# Patient Record
Sex: Male | Born: 1960 | Race: Black or African American | Hispanic: No | Marital: Married | Smoking: Never smoker
Health system: Southern US, Community
[De-identification: ages and names within clinical notes are randomized; demographics above are authoritative.]

## PROBLEM LIST (undated history)

## (undated) ENCOUNTER — Ambulatory Visit (HOSPITAL_COMMUNITY): Admission: EM | Source: Home / Self Care

## (undated) DIAGNOSIS — E785 Hyperlipidemia, unspecified: Secondary | ICD-10-CM

## (undated) DIAGNOSIS — J189 Pneumonia, unspecified organism: Secondary | ICD-10-CM

## (undated) DIAGNOSIS — E349 Endocrine disorder, unspecified: Secondary | ICD-10-CM

## (undated) DIAGNOSIS — R972 Elevated prostate specific antigen [PSA]: Principal | ICD-10-CM

## (undated) DIAGNOSIS — C9000 Multiple myeloma not having achieved remission: Secondary | ICD-10-CM

## (undated) DIAGNOSIS — R7301 Impaired fasting glucose: Secondary | ICD-10-CM

## (undated) DIAGNOSIS — I10 Essential (primary) hypertension: Secondary | ICD-10-CM

## (undated) DIAGNOSIS — M1990 Unspecified osteoarthritis, unspecified site: Secondary | ICD-10-CM

## (undated) HISTORY — DX: Impaired fasting glucose: R73.01

## (undated) HISTORY — DX: Hyperlipidemia, unspecified: E78.5

## (undated) HISTORY — DX: Multiple myeloma not having achieved remission: C90.00

## (undated) HISTORY — DX: Elevated prostate specific antigen (PSA): R97.20

## (undated) HISTORY — DX: Endocrine disorder, unspecified: E34.9

## (undated) HISTORY — PX: OTHER SURGICAL HISTORY: SHX169

---

## 2001-01-26 ENCOUNTER — Encounter (HOSPITAL_BASED_OUTPATIENT_CLINIC_OR_DEPARTMENT_OTHER): Payer: Self-pay | Admitting: General Surgery

## 2001-01-28 ENCOUNTER — Ambulatory Visit (HOSPITAL_COMMUNITY): Admission: RE | Admit: 2001-01-28 | Discharge: 2001-01-28 | Payer: Self-pay | Admitting: General Surgery

## 2001-06-09 ENCOUNTER — Emergency Department (HOSPITAL_COMMUNITY): Admission: EM | Admit: 2001-06-09 | Discharge: 2001-06-09 | Payer: Self-pay | Admitting: Emergency Medicine

## 2001-06-12 ENCOUNTER — Ambulatory Visit (HOSPITAL_COMMUNITY): Admission: RE | Admit: 2001-06-12 | Discharge: 2001-06-12 | Payer: Self-pay | Admitting: Orthopedic Surgery

## 2001-06-12 ENCOUNTER — Encounter: Payer: Self-pay | Admitting: Orthopedic Surgery

## 2001-06-16 ENCOUNTER — Encounter: Payer: Self-pay | Admitting: Neurosurgery

## 2001-06-16 ENCOUNTER — Ambulatory Visit (HOSPITAL_COMMUNITY): Admission: RE | Admit: 2001-06-16 | Discharge: 2001-06-17 | Payer: Self-pay | Admitting: Neurosurgery

## 2001-08-01 ENCOUNTER — Encounter: Payer: Self-pay | Admitting: Neurosurgery

## 2001-08-01 ENCOUNTER — Encounter: Admission: RE | Admit: 2001-08-01 | Discharge: 2001-08-01 | Payer: Self-pay | Admitting: Neurosurgery

## 2001-09-28 ENCOUNTER — Encounter: Payer: Self-pay | Admitting: Neurosurgery

## 2001-09-28 ENCOUNTER — Encounter: Admission: RE | Admit: 2001-09-28 | Discharge: 2001-09-28 | Payer: Self-pay | Admitting: Neurosurgery

## 2001-12-28 ENCOUNTER — Encounter: Payer: Self-pay | Admitting: Neurosurgery

## 2001-12-28 ENCOUNTER — Encounter: Admission: RE | Admit: 2001-12-28 | Discharge: 2001-12-28 | Payer: Self-pay | Admitting: Neurosurgery

## 2003-09-24 ENCOUNTER — Encounter: Payer: Self-pay | Admitting: Internal Medicine

## 2003-09-26 ENCOUNTER — Encounter: Payer: Self-pay | Admitting: Internal Medicine

## 2005-01-05 ENCOUNTER — Ambulatory Visit: Payer: Self-pay | Admitting: Internal Medicine

## 2005-03-30 ENCOUNTER — Ambulatory Visit: Payer: Self-pay | Admitting: Internal Medicine

## 2005-06-03 ENCOUNTER — Ambulatory Visit: Payer: Self-pay | Admitting: Internal Medicine

## 2005-08-12 ENCOUNTER — Ambulatory Visit: Payer: Self-pay | Admitting: Internal Medicine

## 2005-12-31 ENCOUNTER — Emergency Department (HOSPITAL_COMMUNITY): Admission: EM | Admit: 2005-12-31 | Discharge: 2005-12-31 | Payer: Self-pay | Admitting: Emergency Medicine

## 2009-01-14 ENCOUNTER — Emergency Department (HOSPITAL_BASED_OUTPATIENT_CLINIC_OR_DEPARTMENT_OTHER): Admission: EM | Admit: 2009-01-14 | Discharge: 2009-01-14 | Payer: Self-pay | Admitting: Emergency Medicine

## 2009-04-08 ENCOUNTER — Ambulatory Visit: Payer: Self-pay | Admitting: Internal Medicine

## 2009-04-08 DIAGNOSIS — I10 Essential (primary) hypertension: Secondary | ICD-10-CM | POA: Insufficient documentation

## 2009-04-08 DIAGNOSIS — R9431 Abnormal electrocardiogram [ECG] [EKG]: Secondary | ICD-10-CM

## 2009-04-08 DIAGNOSIS — R7301 Impaired fasting glucose: Secondary | ICD-10-CM | POA: Insufficient documentation

## 2009-04-08 DIAGNOSIS — E785 Hyperlipidemia, unspecified: Secondary | ICD-10-CM

## 2009-04-08 DIAGNOSIS — E291 Testicular hypofunction: Secondary | ICD-10-CM | POA: Insufficient documentation

## 2009-04-09 ENCOUNTER — Encounter: Payer: Self-pay | Admitting: Internal Medicine

## 2009-04-10 ENCOUNTER — Encounter (INDEPENDENT_AMBULATORY_CARE_PROVIDER_SITE_OTHER): Payer: Self-pay | Admitting: *Deleted

## 2009-08-16 ENCOUNTER — Ambulatory Visit: Payer: Self-pay | Admitting: Internal Medicine

## 2009-08-19 ENCOUNTER — Encounter (INDEPENDENT_AMBULATORY_CARE_PROVIDER_SITE_OTHER): Payer: Self-pay | Admitting: *Deleted

## 2009-08-19 LAB — CONVERTED CEMR LAB
Bilirubin, Direct: 0.1 mg/dL (ref 0.0–0.3)
HDL: 30.5 mg/dL — ABNORMAL LOW (ref >39.00)
LDL Cholesterol: 71 mg/dL (ref 0–99)
Total Bilirubin: 1.3 mg/dL — ABNORMAL HIGH (ref 0.3–1.2)
Total CHOL/HDL Ratio: 4
VLDL: 21.8 mg/dL (ref 0.0–40.0)

## 2009-08-29 ENCOUNTER — Telehealth (INDEPENDENT_AMBULATORY_CARE_PROVIDER_SITE_OTHER): Payer: Self-pay | Admitting: *Deleted

## 2009-11-29 ENCOUNTER — Encounter: Payer: Self-pay | Admitting: Internal Medicine

## 2010-04-08 ENCOUNTER — Ambulatory Visit: Payer: Self-pay | Admitting: Internal Medicine

## 2010-08-15 ENCOUNTER — Ambulatory Visit: Payer: Self-pay | Admitting: Internal Medicine

## 2010-12-21 LAB — CONVERTED CEMR LAB
ALT: 15 units/L (ref 0–53)
ALT: 19 units/L (ref 0–53)
AST: 18 units/L (ref 0–37)
AST: 21 units/L (ref 0–37)
Albumin: 4.2 g/dL (ref 3.5–5.2)
Albumin: 4.4 g/dL (ref 3.5–5.2)
Alkaline Phosphatase: 42 units/L (ref 39–117)
BUN: 11 mg/dL (ref 6–23)
Basophils Absolute: 0 10*3/uL (ref 0.0–0.1)
Basophils Absolute: 0 10*3/uL (ref 0.0–0.1)
Basophils Relative: 0.3 % (ref 0.0–3.0)
Bilirubin, Direct: 0.2 mg/dL (ref 0.0–0.3)
CO2: 32 meq/L (ref 19–32)
Calcium: 9 mg/dL (ref 8.4–10.5)
Chloride: 101 meq/L (ref 96–112)
Chloride: 107 meq/L (ref 96–112)
Cholesterol, target level: 200 mg/dL
Cholesterol: 128 mg/dL (ref 0–200)
Creatinine, Ser: 1.3 mg/dL (ref 0.4–1.5)
Eosinophils Absolute: 0.1 10*3/uL (ref 0.0–0.7)
Eosinophils Relative: 1.7 % (ref 0.0–5.0)
Eosinophils Relative: 1.9 % (ref 0.0–5.0)
GFR calc non Af Amer: 75.72 mL/min (ref >60)
GFR calc non Af Amer: 83.39 mL/min (ref >60)
Glucose, Bld: 83 mg/dL (ref 70–99)
Glucose, Bld: 88 mg/dL (ref 70–99)
HCT: 43.1 % (ref 39.0–52.0)
HCT: 45.7 % (ref 39.0–52.0)
HDL goal, serum: 40 mg/dL
HDL: 34.4 mg/dL — ABNORMAL LOW (ref >39.00)
Hemoglobin: 15 g/dL (ref 13.0–17.0)
Hemoglobin: 15.4 g/dL (ref 13.0–17.0)
Hgb A1c MFr Bld: 5.6 % (ref 4.6–6.5)
LDL Cholesterol: 74 mg/dL (ref 0–99)
LDL Goal: 80 mg/dL
Lymphocytes Relative: 41.3 % (ref 12.0–46.0)
Lymphs Abs: 1.9 10*3/uL (ref 0.7–4.0)
Lymphs Abs: 2.1 10*3/uL (ref 0.7–4.0)
MCHC: 34.7 g/dL (ref 30.0–36.0)
MCV: 93.6 fL (ref 78.0–100.0)
Monocytes Absolute: 0.4 10*3/uL (ref 0.1–1.0)
Monocytes Relative: 6 % (ref 3.0–12.0)
Monocytes Relative: 7 % (ref 3.0–12.0)
Neutro Abs: 2.6 10*3/uL (ref 1.4–7.7)
Neutro Abs: 3.6 10*3/uL (ref 1.4–7.7)
Neutrophils Relative %: 49.5 % (ref 43.0–77.0)
PSA: 0.97 ng/mL (ref 0.10–4.00)
Platelets: 182 10*3/uL (ref 150.0–400.0)
Potassium: 4 meq/L (ref 3.5–5.1)
RBC: 4.6 M/uL (ref 4.22–5.81)
RDW: 12.4 % (ref 11.5–14.6)
RDW: 13.5 % (ref 11.5–14.6)
Sodium: 141 meq/L (ref 135–145)
TSH: 1.04 microintl units/mL (ref 0.35–5.50)
TSH: 1.42 microintl units/mL (ref 0.35–5.50)
Total Bilirubin: 1.2 mg/dL (ref 0.3–1.2)
Total CHOL/HDL Ratio: 4
Total Protein: 6.8 g/dL (ref 6.0–8.3)
Total Protein: 6.8 g/dL (ref 6.0–8.3)
Triglycerides: 97 mg/dL (ref 0.0–149.0)
WBC: 5.2 10*3/uL (ref 4.5–10.5)

## 2010-12-23 NOTE — Assessment & Plan Note (Signed)
Summary: cpx/kn   Vital Signs:  Patient profile:   50 year old male Height:      70.75 inches Weight:      190 pounds BMI:     26.78 Temp:     98.2 degrees F oral Pulse rate:   60 / minute Resp:     14 per minute BP sitting:   140 / 82  (right arm) Cuff size:   large  Vitals Entered By: Shonna Chock CMA (August 15, 2010 12:49 PM)    History of Present Illness: [name] is here for a physical;he questions "digestive " issues as stool volume varies. Hyperlipidemia Follow-Up      This is a 50 year old man who also presents for Hyperlipidemia follow-up.  The patient denies the following symptoms: chest pain/pressure, exercise intolerance, dypsnea, palpitations, syncope, and pedal edema.  He stopped his statin  medication this Summer.He was concerned about "muscle side effects".  Dietary compliance has been excellent.  The patient reports exercising 4-5X per week @ a high cardiovascular level w/o symptoms.  Adjunctive measures currently used by the patient include fish oil supplements & Co Q 10.    Lipid Management History:      Positive NCEP/ATP III risk factors include male age 71 years old or older, HDL cholesterol less than 40, and family history for ischemic heart disease (males less than 68 years old).  Negative NCEP/ATP III risk factors include non-diabetic, non-tobacco-user status, non-hypertensive, no ASHD (atherosclerotic heart disease), no prior stroke/TIA, no peripheral vascular disease, and no history of aortic aneurysm.     Current Medications (verified): 1)  Pravastatin Sodium 40 Mg Tabs (Pravastatin Sodium) .Marland Kitchen.. 1 At Bedtime  Allergies (verified): No Known Drug Allergies  Past History:  Past Medical History: NS ST-T EKG changes  ; Fasting hyperglycemia with normal A1c; Low testosterone ( 288 in 2006;417 post treatment) , Dr Karalee Height; Hyperlipidemia: NMR Lipoprofile : LDL 99(1700/1381), HDL 35, TG 135. LDL goal = <  80 based on NMR Liporofile. Framingham Study  LDL goal = < 130.  Past Surgical History: Benign lesions from chest & back (lipomas); cervical plate insertion post compression disc injury  Family History: Father: MI @ 67, smoker Mother: breast  cancer  Siblings: bro: renal failure, dialysis,HTN; M uncle :CVA ; M aunt MI > 6, CBAG  Social History: Occupation: Naval architect Married Never Smoked Alcohol use-yes: rarely Regular exercise-yes  Review of Systems  The patient denies anorexia, fever, weight loss, weight gain, vision loss, decreased hearing, hoarseness, prolonged cough, hemoptysis, melena, hematochezia, severe indigestion/heartburn, hematuria, suspicious skin lesions, depression, unusual weight change, abnormal bleeding, enlarged lymph nodes, and angioedema.    Physical Exam  General:  Well-developed,well-nourished; alert,appropriate and cooperative throughout examination Head:  Normocephalic and atraumatic without obvious abnormalities. No apparent alopecia ; moustache Eyes:  No corneal or conjunctival inflammation noted. Perrla. Funduscopic exam benign, without hemorrhages, exudates or papilledema.  Ears:  External ear exam shows no significant lesions or deformities.  Otoscopic examination reveals clear canals, tympanic membranes are intact bilaterally without bulging, retraction, inflammation or discharge. Hearing is grossly normal bilaterally. Nose:  External nasal examination shows no deformity or inflammation. Nasal mucosa are pink and moist without lesions or exudates. Mouth:  Oral mucosa and oropharynx without lesions or exudates.  Teeth in good repair. No pharyngeal erythema.   Neck:  No deformities, masses, or tenderness noted. Lungs:  Normal respiratory effort, chest expands symmetrically. Lungs are clear to auscultation, no crackles or wheezes.  Heart:  regular rhythm, no murmur, no gallop, no rub, no JVD, no HJR, and bradycardia.  S 4 with slight slurring @ apex Abdomen:  Bowel sounds positive,abdomen soft, well  muscled  and non-tender without masses, organomegaly or hernias noted. Rectal:  given stool cards Genitalia:  Dr Marcello Fennel Prostate:  Dr Marcello Fennel Msk:  No deformity or scoliosis noted of thoracic or lumbar spine.   Pulses:  R and L carotid,radial,dorsalis pedis and posterior tibial pulses are full and equal bilaterally Extremities:  No clubbing, cyanosis, edema, or deformity noted with normal full range of motion of all joints.   Neurologic:  alert & oriented X3 and DTRs symmetrical and normal.   Skin:  Intact without suspicious lesions or rashes Cervical Nodes:  No lymphadenopathy noted Axillary Nodes:  No palpable lymphadenopathy Psych:  memory intact for recent and remote, normally interactive, and good eye contact.     Impression & Recommendations:  Problem # 1:  ROUTINE GENERAL MEDICAL EXAM@[email or web address]  CARE FACL (ICD-V70.0)  Orders: EKG w/ Interpretation (93000): NS ST-T changes since 2004; asymptomatic @ high levels of CVE Venipuncture (16109) TLB-Lipid Panel (80061-LIPID) TLB-BMP (Basic Metabolic Panel-BMET) (80048-METABOL) TLB-CBC Platelet - w/Differential (85025-CBCD) TLB-Hepatic/Liver Function Pnl (80076-HEPATIC) TLB-TSH (Thyroid Stimulating Hormone) (84443-TSH)  Problem # 2:  HYPERLIPIDEMIA (ICD-272.4)  His updated medication list for this problem includes:    Pravastatin Sodium 40 Mg Tabs (Pravastatin sodium) .Marland Kitchen... 1 at bedtime  Problem # 3:  ELEVATED BLOOD PRESSURE WITHOUT DIAGNOSIS OF HYPERTENSION (ICD-796.2)  Problem # 4:  NONSPECIFIC ABNORMAL ELECTROCARDIOGRAM (ICD-794.31) slightly progression of NS ST-T changes on serial EKGs  2004 to present  Complete Medication List: 1)  Pravastatin Sodium 40 Mg Tabs (Pravastatin sodium) .Marland Kitchen.. 1 at bedtime  Lipid Assessment/Plan:      Based on NCEP/ATP III, the patient's risk factor category is "0-1 risk factors".  The patient's lipid goals are as follows: Total cholesterol goal is 200; LDL cholesterol goal is 80; HDL  cholesterol goal is 40; Triglyceride goal is 150.  His LDL cholesterol goal has been met.    Patient Instructions: 1)  Please complete stool cards.Take an 81 mg coated Aspirin every day. Note: Pravastatin resumed  4 days ago , off all Summer. Report any chest pain with exertion. Check your Blood Pressure regularly. If it is above:  135/85 ON AVERAGE you should make an appointment. Prescriptions: PRAVASTATIN SODIUM 40 MG TABS (PRAVASTATIN SODIUM) 1 at bedtime  #90 x 3   Entered and Authorized by:   Marga Melnick MD   Signed by:   Marga Melnick MD on 08/15/2010   Method used:   Print then Give to Patient   RxID:   [id number]

## 2010-12-23 NOTE — Letter (Signed)
Summary: [facility]  [facility]   Imported By: Lanelle Bal 12/10/2009 12:20:58  _____________________________________________________________________  External Attachment:    Type:   Image     Comment:   External Document
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
## 2011-04-10 NOTE — Discharge Summary (Signed)
[place]. [facility]  Patient:    Jimmy Day, Jimmy Day                         MRN: [id number] Adm. Date:  [id number] Disc. Date: [id number] Attending:  Colon Branch                           Discharge Summary  DIAGNOSIS:  Herniated nucleus pulposus left C5-6.  DISCHARGE DIAGNOSIS:  Herniated nucleus pulposus left C5-6.  PROCEDURES:  Anterior cervical diskectomy and fusion C5-6 with ______ interbody and autograft.  REASON FOR ADMISSION:  The patient is a 50 year old gentleman who has had a three-week history of severe left arm pain with numbness and weakness that has been progressive.  MRI was done and showed spondylitic changes at four levels of the cervical spine, but at 5-6 lateral is a disk herniation obliterating the foramen.  The patient was brought in for ACF of that level.  HOSPITAL COURSE:  The patient was admitted on the day of surgery and underwent the procedure above without complications.  There was a lot of compression of that nerve root intraoperatively and some free fragments out in the foramen which were removed.  Postoperatively the patient immediately had decreased arm pain and improved sensation.  Overnight he has been ambulating, eating well, using his left arm a bit.  Still had a little tingling and numbness in the left thumb, but it is improved from before.  Strength is intact, and he has no arm pain.  Posterior and anterior neck soreness, the fact that he is real happy with his progress.  His incision is clean, dry, and intact.  The patient will be discharged home in stable condition.  DISCHARGE MEDICATIONS:  Vicodin p.r.n. pain.  Flexeril p.r.n. pain.  No nonsteroidal anti-inflammatories for three weeks.  DISCHARGE INSTRUCTIONS:  Keep C collar on at all times other than showering and shaving.  DIET:  As tolerated.  FOLLOW-UP:  In three weeks in my office. DD:  06/17/01 TD:  06/18/01 Job: [id number]

## 2011-04-10 NOTE — Op Note (Signed)
[place]. [facility]  Patient:    Jimmy Day, Jimmy Day                         MRN: [id number] Proc. Date: 01/28/01 Adm. Date:  [id number] Attending:  Sonda Primes                           Operative Report  PREOPERATIVE DIAGNOSIS:  Lipoma of left posterior shoulder.  POSTOPERATIVE DIAGNOSIS:  Lipoma of left posterior shoulder.  PROCEDURE:  Excision, lipoma left posterior shoulder.  SURGEON:  Mardene Celeste. Lurene Shadow, M.D.  ASSISTANT:  Nurse.  ANESTHESIA:  General.  CLINICAL NOTE:  The patient is a 50 year old man with an enlarging mass over just medial to the scapula over the rhomboideus muscles.  This had been causing some degree of discomfort, and he requests the removal.  DESCRIPTION OF PROCEDURE:  Following the induction of anesthesia, the patient was positioned in the lateral recumbent position and the left shoulder and back are prepped and draped to be included in the sterile operative field.  I made a transverse incision across the lesion, deepened this through the skin and subcutaneous tissue, carrying it down to the capsule of the lipoma.  The capsule was incised and the lipoma dissected free from on all sides and removed in its entirety and forwarded for pathologic evaluation.  Hemostasis obtained with electrocautery.  Sponges and instruments are counted and verified.  The wound is closed in two layers with 3-0 interrupted Vicryl sutures in the subcutaneous tissues and 4-0 Monocryl in the skin, reinforced with Steri-Strips.  Sterile dressing applied, anesthetic reversed.  The patient removed from the operating room to the recovery room in stable condition.  He tolerated the procedure very well. DD:  01/28/01 TD:  01/28/01 Job: [id number]

## 2011-09-23 ENCOUNTER — Encounter: Payer: Self-pay | Admitting: Internal Medicine

## 2011-09-25 ENCOUNTER — Encounter: Payer: Self-pay | Admitting: Internal Medicine

## 2011-09-25 ENCOUNTER — Ambulatory Visit (INDEPENDENT_AMBULATORY_CARE_PROVIDER_SITE_OTHER): Payer: BC Managed Care – PPO | Admitting: Internal Medicine

## 2011-09-25 VITALS — BP 124/88 | HR 78 | Temp 98.0°F | Resp 16 | Ht 71.5 in | Wt 190.2 lb

## 2011-09-25 DIAGNOSIS — E785 Hyperlipidemia, unspecified: Secondary | ICD-10-CM

## 2011-09-25 DIAGNOSIS — Z0000 Encounter for general adult medical examination without abnormal findings: Secondary | ICD-10-CM

## 2011-09-25 DIAGNOSIS — Z8249 Family history of ischemic heart disease and other diseases of the circulatory system: Secondary | ICD-10-CM

## 2011-09-25 DIAGNOSIS — R7309 Other abnormal glucose: Secondary | ICD-10-CM

## 2011-09-25 DIAGNOSIS — R9431 Abnormal electrocardiogram [ECG] [EKG]: Secondary | ICD-10-CM

## 2011-09-25 DIAGNOSIS — Z136 Encounter for screening for cardiovascular disorders: Secondary | ICD-10-CM

## 2011-09-25 NOTE — Progress Notes (Signed)
Subjective:    Patient ID: Jimmy Day, male    DOB: February 17, 1961, 50 y.o.   MRN: [id number]  HPI  Jimmy Day  is here for a physical;acute issues intermittent  R flank pain since August 2012. He is to see Dr Marcello Fennel today @ 1 pm.      Review of Systems   He denies hematuria, pyuria, dysuria, incontinence voiding issues. He has no history of renal calculi. He denies abdominal pain, melena, or rectal bleeding. He is on a stool softener. He is 50 years old;he has never had a colonoscopy  Dyslipidemia assessment: Prior Advanced Lipid Testing: NMR 2004 (reviewed).   Family history of premature CAD/ MI: father MI @ 45; M aunt MI @ 70 .  Nutrition: no plan; heart healthy .  Exercise: running 2-3 X/ week . Diabetes : no . HTN: PMH of elevated BP w/o diagnosis of HTN. Smoking history  : never .   Weight :  stable.  He was previously on pravastatin but this was not refilled when  the prescription ran out.  ROS: fatigue: no ; chest pain : no ;claudication: no; palpitations: no;  myalgias:no;  syncope : no ; memory loss: no;skin/ hair/ nails changes: no.      Objective:   Physical Exam Gen.: Well muscled ,healthy and well-nourished in appearance. Alert, appropriate and cooperative throughout exam. Head: Normocephalic without obvious abnormalities;  no alopecia  Eyes: No corneal or conjunctival inflammation noted. Pupils equal round reactive to light and accommodation. Fundal exam is benign without hemorrhages, exudate, papilledema. Extraocular motion intact. Vision grossly normal. Faint arcus  suggested Ears: External  ear exam reveals no significant lesions or deformities. Canals clear .TMs normal. Hearing is grossly normal bilaterally. Nose: External nasal exam reveals no deformity or inflammation. Nasal mucosa are pink and moist. No lesions or exudates noted.  Mouth: Oral mucosa and oropharynx reveal no lesions or exudates. Teeth in good repair. Neck: No deformities, masses, or tenderness  noted. Range of motion & . Thyroid normal. Lungs: Normal respiratory effort; chest expands symmetrically. Lungs are clear to auscultation without rales, wheezes, or increased work of breathing. Heart: Normal rate and rhythm. Normal S1 and S2. No gallop, click, or rub. No  murmur. Abdomen: Bowel sounds normal; abdomen soft and nontender. No masses, organomegaly or hernias noted. Genitalia/ DRE : Dr Marcello Fennel   .                                                                                   Musculoskeletal/extremities: No deformity or scoliosis noted of  the thoracic or lumbar spine. No clubbing, cyanosis, edema, or deformity noted. Range of motion  normal .Tone & strength  Normal.Joints; minor flexion finger changes L 5th digit. Nail health  good. He is able to lie flat and sit up without help. Straight leg raising is negative to 90. Gait including heel and toe walking is normal. Vascular: Carotid, radial artery, dorsalis pedis and  posterior tibial pulses are full and equal. No bruits present. Neurologic: Alert and oriented x3. Deep tendon reflexes symmetrical and normal.          Skin: Intact without suspicious lesions or rashes.  Lymph: No cervical, axillary  lymphadenopathy present. Psych: Mood and affect are normal. Normally interactive                                                                                          Assessment & Plan:  #1 comprehensive physical exam; no acute findings #2 see Problem List with Assessments & Recommendations  #3 right flank pain intermittently for several months. Exam does not suggest a lumbosacral radiculopathy. He will see his urologist today. He would be due for a screening colonoscopy.  #4 nonspecific ST-T wave changes infero laterally. These may be slightly progressive compared to 04/09/2009. With the family history premature coronary disease and prior abnormal NMR Lipoprofile; additional advanced testing his needed. Cardiology consultation is  also appropriate. Plan: see Orders

## 2011-09-25 NOTE — Patient Instructions (Addendum)
Once your cardiology evaluation is complete; I recommend a screening colonoscopy.As per the Standard of Care , screening Colonoscopy recommended @ 50 & every 5-10 years thereafter . More frequent monitor would be dictated by family history or findings @ Colonoscopy  Take 325 mg coated ASA daily with breakfast

## 2011-09-25 NOTE — Progress Notes (Signed)
Addended by: Regis Bill on: 09/25/2011 05:48 PM   Modules accepted: Orders

## 2011-09-30 LAB — CBC WITH DIFFERENTIAL/PLATELET
Eosinophils Absolute: 0.1 10*3/uL (ref 0.0–0.7)
Lymphs Abs: 2.1 10*3/uL (ref 0.7–4.0)
MCHC: 33.9 g/dL (ref 30.0–36.0)
MCV: 93.8 fl (ref 78.0–100.0)
Monocytes Absolute: 0.4 10*3/uL (ref 0.1–1.0)
Neutrophils Relative %: 51.9 % (ref 43.0–77.0)
Platelets: 201 10*3/uL (ref 150.0–400.0)
RDW: 13.3 % (ref 11.5–14.6)

## 2011-09-30 LAB — BASIC METABOLIC PANEL
BUN: 10 mg/dL (ref 6–23)
CO2: 29 mEq/L (ref 19–32)
Chloride: 104 mEq/L (ref 96–112)
Creatinine, Ser: 1.2 mg/dL (ref 0.4–1.5)
Glucose, Bld: 97 mg/dL (ref 70–99)

## 2011-09-30 LAB — TSH: TSH: 1.18 u[IU]/mL (ref 0.35–5.50)

## 2011-09-30 LAB — HEPATIC FUNCTION PANEL
Albumin: 4.4 g/dL (ref 3.5–5.2)
Total Bilirubin: 0.8 mg/dL (ref 0.3–1.2)

## 2011-10-06 ENCOUNTER — Ambulatory Visit (INDEPENDENT_AMBULATORY_CARE_PROVIDER_SITE_OTHER): Payer: BC Managed Care – PPO | Admitting: Cardiovascular Disease

## 2011-10-06 ENCOUNTER — Encounter: Payer: Self-pay | Admitting: Cardiovascular Disease

## 2011-10-06 ENCOUNTER — Ambulatory Visit (HOSPITAL_COMMUNITY): Payer: BC Managed Care – PPO | Attending: Cardiology | Admitting: Radiology

## 2011-10-06 VITALS — BP 141/90 | HR 61 | Ht 71.0 in | Wt 194.0 lb

## 2011-10-06 DIAGNOSIS — R9431 Abnormal electrocardiogram [ECG] [EKG]: Secondary | ICD-10-CM

## 2011-10-06 DIAGNOSIS — I079 Rheumatic tricuspid valve disease, unspecified: Secondary | ICD-10-CM | POA: Insufficient documentation

## 2011-10-06 DIAGNOSIS — E785 Hyperlipidemia, unspecified: Secondary | ICD-10-CM | POA: Insufficient documentation

## 2011-10-06 DIAGNOSIS — I059 Rheumatic mitral valve disease, unspecified: Secondary | ICD-10-CM | POA: Insufficient documentation

## 2011-10-06 MED ORDER — AMLODIPINE BESYLATE 5 MG PO TABS: 5.0000 mg | ORAL_TABLET | Freq: Every day | ORAL | Status: DC

## 2011-10-06 NOTE — Patient Instructions (Signed)
Your physician recommends that you schedule a follow-up appointment as needed  Your physician has requested that you have an echocardiogram. Echocardiography is a painless test that uses sound waves to create images of your heart. It provides your doctor with information about the size and shape of your heart and how well your heart's chambers and valves are working. This procedure takes approximately one hour. There are no restrictions for this procedure.   Your physician has requested that you have an exercise tolerance test. For further information please visit [email or web address]. Please also follow instruction sheet, as given.    

## 2011-10-06 NOTE — Progress Notes (Signed)
History of Present Illness: 50 yo AAM with history of HLD and strong family history of CAD who is here today to establish cardiology care. He was recently seen by Dr. Alwyn Ren who did an EKG which showed non-specific T wave flattening. He tells me that he exercises every day. He has had no chest pain or SOB. He is concerned about the possibility of CAD with his family history.   Past Medical History  Diagnosis Date  . Fasting hyperglycemia     NORMAL A1c  . Hyperlipidemia   . Testosterone deficiency     Dr Marcello Fennel    Past Surgical History  Procedure Date  . Lipomas removal     BENIGN FROM CHEST AND BACK  . Cervical plate insertion     POST COMPRESSION DISC INJURY    No current outpatient prescriptions on file.    No Known Allergies  History   Social History  . Marital Status: Married    Spouse Name: N/A    Number of Children: 3  . 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 Smoking status: Never Smoker   . Smokeless tobacco: Not on file  . Alcohol Use: Yes      rarely  . Drug Use:   . Sexually Active:    Other Topics Concern  . Not on file   Social History Narrative  . No narrative on file    Family History  Problem Relation Age of Onset  . Cancer Mother     BREAST  . Heart disease Father 9    MI  . Kidney disease Brother     RENAL FAILURE  . Hypertension Brother   . Heart disease Maternal Aunt 65    MI;CABG  . Stroke Maternal Uncle     CVA    Review of Systems:  As stated in the HPI and otherwise negative.   BP 141/90  Pulse 61  Ht 5\' 11"  (1.803 m)  Wt 194 lb (87.998 kg)  BMI 27.06 kg/m2  Physical Examination: General: Well developed, well nourished, NAD HEENT: OP clear, mucus membranes moist SKIN: warm, dry. No rashes. Neuro: No focal deficits Musculoskeletal: Muscle strength 5/5 all ext Psychiatric: Mood and affect normal Neck: No JVD, no carotid bruits, no thyromegaly, no  lymphadenopathy. Lungs:Clear bilaterally, no wheezes, rhonci, crackles Cardiovascular: Regular rate and rhythm. No murmurs, gallops or rubs. Abdomen:Soft. Bowel sounds present. Non-tender.  Extremities: No lower extremity edema. Pulses are 2 + in the bilateral DP/PT.  EKG: NSR, non-specific T wave flattening.

## 2011-10-06 NOTE — Assessment & Plan Note (Signed)
No chest pain or SOB with daily exercise. He has never been a smoker. He does have non-specific T wave changes on EKG and has a strong family history of premature CAD (father died of MI at age 50). Will arrange echo to exclude structural heart disease and treadmill stress test to exclude ischemic disease.

## 2011-10-06 NOTE — Progress Notes (Signed)
Exercise Treadmill Test  Pre-Exercise Testing Evaluation Rhythm: normal sinus  Rate: 66   PR:  .15 QRS:  .08  QT:  .38 QTc: .40     Test  Exercise Tolerance Test Ordering MD: Melene Muller, MD  Interpreting MD:  Melene Muller, MD  Unique Test No: 1  Treadmill:  1  Indication for ETT: Abnormal EKG  Contraindication to ETT: No   Stress Modality: exercise - treadmill  Cardiac Imaging Performed: non   Protocol: standard Bruce - maximal  Max BP:  223/104  Max MPHR (bpm):  170 85% MPR (bpm):  144  MPHR obtained (bpm):  157 % MPHR obtained:  92  Reached 85% MPHR (min:sec):  7:30 Total Exercise Time (min-sec):  9:00  Workload in METS:  10.1 Borg Scale: 14  Reason ETT Terminated:  dyspnea    ST Segment Analysis At Rest: normal ST segments - no evidence of significant ST depression With Exercise: no evidence of significant ST depression  Other Information Arrhythmia:  No Angina during ETT:  absent (0) Quality of ETT:  non-diagnostic  ETT Interpretation:  normal - no evidence of ischemia by ST analysis  Comments:  Pt exercised for 9 minutes on the Bruce protocol. No exercise induced chest pain or EKG changes to suggest ischemia. Hypertensive response to exercise.   Recommendations: No further ischemic workup. Will start Norvasc 5 mg po Qdaily for BP control. He will monitor his BP at home. If still elevated in two weeks, will titrate Norvasc to 10 mg po Qdaily.

## 2011-10-06 NOTE — Progress Notes (Signed)
Addended by: Judithe Modest D on: 10/06/2011 03:57 PM   Modules accepted: Orders

## 2011-10-09 ENCOUNTER — Encounter: Payer: Self-pay | Admitting: Internal Medicine

## 2011-10-12 ENCOUNTER — Encounter: Payer: Self-pay | Admitting: Internal Medicine

## 2011-10-12 ENCOUNTER — Ambulatory Visit (INDEPENDENT_AMBULATORY_CARE_PROVIDER_SITE_OTHER): Payer: BC Managed Care – PPO | Admitting: Internal Medicine

## 2011-10-12 DIAGNOSIS — Z8249 Family history of ischemic heart disease and other diseases of the circulatory system: Secondary | ICD-10-CM

## 2011-10-12 DIAGNOSIS — E785 Hyperlipidemia, unspecified: Secondary | ICD-10-CM

## 2011-10-12 MED ORDER — SIMVASTATIN 20 MG PO TABS: 20.0000 mg | ORAL_TABLET | Freq: Every day | ORAL | Status: DC

## 2011-10-12 NOTE — Progress Notes (Signed)
  Subjective:    Patient ID: Jimmy Day, male    DOB: [birth year], 50 y.o.   MRN: [id number]  HPI Dyslipidemia assessment: Prior Advanced Lipid Testing: based on NMR Lipoprofile & HDL panel: LDL goal = < 70; Simvastatin preferred ; not hyperabsorber.   Family history of premature CAD/ MI: father MI @ 15 & M aunt @ 3 .  Nutrition: heart healthy  .  Exercise: 3-4X/ week for > 30 min . Diabetes : no . HTN: BP average 127/80 @ home (see BP; "rushed today"). Smoking history  : never .   Weight :  stable.    Review of Systems ROS: fatigue: no ; chest pain : no ;claudication: no; palpitations: no; abd pain/bowel changes: no ; myalgias:no;  skin changes: no.     Objective:   Physical Exam  he is healthy and well-nourished, well-toned  and conditioned  All pulses are intact with no deficit.  He has a slow heart rate with no significant murmurs or gallops.  He has no edema        Assessment & Plan:  #1 dyslipidemia with family history of premature coronary artery disease. Based on 2 advanced  Cholesterol tests, his LDL goal is less than 70. Genetic testing suggest the best agent would be simvastatin.  #2 hypertension; he states today is an aberration due  to rushing to this appointment.  He'll be asked to take his blood pressure cuff to all doctor appointments along with a diary of blood pressure readings

## 2011-10-12 NOTE — Patient Instructions (Signed)
Please review Dr Gildardo Griffes book Eat, Drink & Be Healthy for dietary cholesterol information. Please  schedule fasting Labs :  Lipid Panel ; CPK; hepatic panel after 10 weeks (272.4,V17.3, 995.20).  Please bring these instructions to that Lab appt.  Blood Pressure Goal  Ideally is an AVERAGE < 135/85. This AVERAGE should be calculated from @ least 5-7 BP readings taken @ different times of day on different days of week. You should not respond to isolated BP readings , but rather the AVERAGE for that week

## 2011-10-14 ENCOUNTER — Ambulatory Visit: Payer: BC Managed Care – PPO | Admitting: Cardiovascular Disease

## 2012-01-11 ENCOUNTER — Other Ambulatory Visit: Payer: Self-pay | Admitting: Internal Medicine

## 2012-01-11 DIAGNOSIS — T887XXA Unspecified adverse effect of drug or medicament, initial encounter: Secondary | ICD-10-CM

## 2012-01-11 DIAGNOSIS — Z8249 Family history of ischemic heart disease and other diseases of the circulatory system: Secondary | ICD-10-CM

## 2012-01-11 DIAGNOSIS — E785 Hyperlipidemia, unspecified: Secondary | ICD-10-CM

## 2012-01-15 ENCOUNTER — Other Ambulatory Visit: Payer: BC Managed Care – PPO

## 2012-04-29 ENCOUNTER — Other Ambulatory Visit: Payer: Self-pay | Admitting: Internal Medicine

## 2012-06-12 ENCOUNTER — Other Ambulatory Visit: Payer: Self-pay | Admitting: Cardiovascular Disease

## 2012-11-02 ENCOUNTER — Other Ambulatory Visit: Payer: Self-pay | Admitting: Cardiovascular Disease

## 2013-02-14 ENCOUNTER — Other Ambulatory Visit: Payer: Self-pay | Admitting: Cardiovascular Disease

## 2013-02-21 ENCOUNTER — Encounter (HOSPITAL_COMMUNITY): Payer: Self-pay | Admitting: Emergency Medicine

## 2013-02-21 ENCOUNTER — Emergency Department (HOSPITAL_COMMUNITY)
Admission: EM | Admit: 2013-02-21 | Discharge: 2013-02-21 | Disposition: A | Discharge status: Nursing Home (Includes ICF) | Payer: BC Managed Care – PPO | Source: Home / Self Care | Attending: Family Medicine | Admitting: Family Medicine

## 2013-02-21 ENCOUNTER — Emergency Department (HOSPITAL_COMMUNITY)
Admission: EM | Admit: 2013-02-21 | Discharge: 2013-02-22 | Disposition: A | Discharge status: Home / Self Care | Payer: BC Managed Care – PPO | Attending: Emergency Medicine | Admitting: Emergency Medicine

## 2013-02-21 ENCOUNTER — Emergency Department (HOSPITAL_COMMUNITY): Payer: BC Managed Care – PPO

## 2013-02-21 ENCOUNTER — Encounter (HOSPITAL_COMMUNITY): Payer: Self-pay | Admitting: *Deleted

## 2013-02-21 DIAGNOSIS — R109 Unspecified abdominal pain: Secondary | ICD-10-CM

## 2013-02-21 DIAGNOSIS — Z862 Personal history of diseases of the blood and blood-forming organs and certain disorders involving the immune mechanism: Secondary | ICD-10-CM | POA: Insufficient documentation

## 2013-02-21 DIAGNOSIS — Z8639 Personal history of other endocrine, nutritional and metabolic disease: Secondary | ICD-10-CM | POA: Insufficient documentation

## 2013-02-21 DIAGNOSIS — Z79899 Other long term (current) drug therapy: Secondary | ICD-10-CM | POA: Insufficient documentation

## 2013-02-21 DIAGNOSIS — E785 Hyperlipidemia, unspecified: Secondary | ICD-10-CM | POA: Insufficient documentation

## 2013-02-21 DIAGNOSIS — R1013 Epigastric pain: Secondary | ICD-10-CM | POA: Insufficient documentation

## 2013-02-21 DIAGNOSIS — R110 Nausea: Secondary | ICD-10-CM | POA: Insufficient documentation

## 2013-02-21 DIAGNOSIS — Z7982 Long term (current) use of aspirin: Secondary | ICD-10-CM | POA: Insufficient documentation

## 2013-02-21 LAB — CBC WITH DIFFERENTIAL/PLATELET
Basophils Absolute: 0 10*3/uL (ref 0.0–0.1)
Eosinophils Relative: 0 % (ref 0–5)
HCT: 45.3 % (ref 39.0–52.0)
Hemoglobin: 16.7 g/dL (ref 13.0–17.0)
Lymphocytes Relative: 12 % (ref 12–46)
MCHC: 36.9 g/dL — ABNORMAL HIGH (ref 30.0–36.0)
MCV: 86 fL (ref 78.0–100.0)
Monocytes Absolute: 0.5 10*3/uL (ref 0.1–1.0)
Monocytes Relative: 7 % (ref 3–12)
RDW: 12.7 % (ref 11.5–15.5)
WBC: 8.2 10*3/uL (ref 4.0–10.5)

## 2013-02-21 LAB — URINALYSIS, MICROSCOPIC ONLY
Ketones, ur: 15 mg/dL — AB
Leukocytes, UA: NEGATIVE
Nitrite: NEGATIVE
Protein, ur: 30 mg/dL — AB
Urobilinogen, UA: 1 mg/dL (ref 0.0–1.0)

## 2013-02-21 LAB — COMPREHENSIVE METABOLIC PANEL
AST: 22 U/L (ref 0–37)
BUN: 15 mg/dL (ref 6–23)
CO2: 24 mEq/L (ref 19–32)
Calcium: 9.1 mg/dL (ref 8.4–10.5)
Creatinine, Ser: 1.23 mg/dL (ref 0.50–1.35)
GFR calc Af Amer: 76 mL/min — ABNORMAL LOW (ref >90)
GFR calc non Af Amer: 66 mL/min — ABNORMAL LOW (ref >90)

## 2013-02-21 LAB — LIPASE, BLOOD: Lipase: 18 U/L (ref 11–59)

## 2013-02-21 MED ORDER — ONDANSETRON HCL 4 MG/2ML IJ SOLN
4.0000 mg | Freq: Once | INTRAMUSCULAR | Status: AC
  Administered 2013-02-21: 4 mg via INTRAVENOUS

## 2013-02-21 MED ORDER — HYDROMORPHONE HCL 1 MG/ML IJ SOLN
2.0000 mg | Freq: Once | INTRAMUSCULAR | Status: AC
  Administered 2013-02-21: 2 mg via INTRAMUSCULAR

## 2013-02-21 MED ORDER — MORPHINE SULFATE 4 MG/ML IJ SOLN
4.0000 mg | Freq: Once | INTRAMUSCULAR | Status: AC
  Administered 2013-02-21: 4 mg via INTRAVENOUS
  Filled 2013-02-21: qty 1

## 2013-02-21 MED ORDER — PANTOPRAZOLE SODIUM 40 MG IV SOLR
40.0000 mg | Freq: Once | INTRAVENOUS | Status: AC
  Administered 2013-02-21: 40 mg via INTRAVENOUS
  Filled 2013-02-21: qty 40

## 2013-02-21 MED ORDER — OMEPRAZOLE 20 MG PO CPDR: 20.0000 mg | DELAYED_RELEASE_CAPSULE | Freq: Every day | ORAL | Status: DC

## 2013-02-21 MED ORDER — SODIUM CHLORIDE 0.9 % IV BOLUS (SEPSIS)
1000.0000 mL | Freq: Once | INTRAVENOUS | Status: AC
  Administered 2013-02-21: 1000 mL via INTRAVENOUS

## 2013-02-21 MED ORDER — ONDANSETRON HCL 8 MG PO TABS: 8.0000 mg | ORAL_TABLET | Freq: Three times a day (TID) | ORAL | Status: DC | PRN

## 2013-02-21 MED ORDER — HYDROMORPHONE HCL PF 1 MG/ML IJ SOLN
INTRAMUSCULAR | Status: AC
  Filled 2013-02-21: qty 2

## 2013-02-21 MED ORDER — GI COCKTAIL ~~LOC~~
30.0000 mL | Freq: Once | ORAL | Status: AC
  Administered 2013-02-21: 30 mL via ORAL
  Filled 2013-02-21: qty 30

## 2013-02-21 MED ORDER — OXYCODONE-ACETAMINOPHEN 5-325 MG PO TABS: 1.0000 | ORAL_TABLET | ORAL | Status: DC | PRN

## 2013-02-21 MED ORDER — HYDROMORPHONE HCL PF 1 MG/ML IJ SOLN
1.0000 mg | Freq: Once | INTRAMUSCULAR | Status: AC
  Administered 2013-02-21: 1 mg via INTRAVENOUS

## 2013-02-21 MED ORDER — ONDANSETRON HCL 4 MG/2ML IJ SOLN
INTRAMUSCULAR | Status: AC
  Filled 2013-02-21: qty 2

## 2013-02-21 MED ORDER — ONDANSETRON HCL 4 MG/2ML IJ SOLN
4.0000 mg | Freq: Once | INTRAMUSCULAR | Status: AC
  Administered 2013-02-21: 4 mg via INTRAVENOUS
  Filled 2013-02-21: qty 2

## 2013-02-21 NOTE — ED Provider Notes (Signed)
History     CSN: [id number]  Arrival date & time 02/21/13  1745   None     Chief Complaint  Patient presents with  . Abdominal Pain    `    (Consider location/radiation/quality/duration/timing/severity/associated sxs/prior treatment) HPI History provided by pt.   52yo M sent from [facility] for further evaluation of RLQ pain.  Pain started acutely at 2am today.  Burning, non-radiating, associated w/ nausea.  Denies fever, flank or back pain, urinary sx, diarrhea, hematochezia/melena.  No h/o abdominal surgeries.  No h/o kidney stones.  No known sick contacts.   Past Medical History  Diagnosis Date  . Fasting hyperglycemia     NORMAL A1c  . Hyperlipidemia   . Testosterone deficiency     Dr Marcello Fennel    Past Surgical History  Procedure Laterality Date  . Lipomas removal      BENIGN FROM CHEST AND BACK  . Cervical plate insertion      POST COMPRESSION DISC INJURY    Family History  Problem Relation Age of Onset  . Cancer Mother     BREAST  . Heart disease Father 64    MI  . Kidney disease Brother     RENAL FAILURE  . Hypertension Brother   . Heart disease Maternal Aunt 65    MI;CABG  . Stroke Maternal Uncle     CVA    History  Substance Use Topics  . Smoking status: Never Smoker   . Smokeless tobacco: Not on file  . Alcohol Use: Yes     Comment:  rarely      Review of Systems  All other systems reviewed and are negative.    Allergies  Review of patient's allergies indicates no known allergies.  Home Medications   Current Outpatient Rx  Name  Route  Sig  Dispense  Refill  . amLODipine (NORVASC) 5 MG tablet   Oral   Take 5 mg by mouth daily.         Marland Kitchen aspirin EC 81 MG tablet   Oral   Take 81 mg by mouth daily.         Marland Kitchen ibuprofen (ADVIL,MOTRIN) 200 MG tablet   Oral   Take 200-400 mg by mouth every 6 (six) hours as needed for pain.         . simvastatin (ZOCOR) 40 MG tablet   Oral   Take 40 mg by mouth every evening.           BP  118/82  Pulse 74  Temp(Src) 99.5 F (37.5 C) (Oral)  Resp 20  SpO2 95%  Physical Exam  Nursing note and vitals reviewed. Constitutional: He is oriented to person, place, and time. He appears well-developed and well-nourished. No distress.  HENT:  Head: Normocephalic and atraumatic.  Mouth/Throat: Oropharynx is clear and moist.  Eyes:  Normal appearance  Neck: Normal range of motion.  Cardiovascular: Normal rate and regular rhythm.   Pulmonary/Chest: Effort normal and breath sounds normal. No respiratory distress.  Abdominal: Soft. Bowel sounds are normal. He exhibits no distension and no mass. There is no rebound and no guarding.  Epigastric ttp  Genitourinary:  No CVA tenderness  Musculoskeletal: Normal range of motion.  No tenderness of spine  Neurological: He is alert and oriented to person, place, and time.  Skin: Skin is warm and dry. No rash noted.  Psychiatric: He has a normal mood and affect. His behavior is normal.    ED Course  Procedures (  including critical care time)  Labs Reviewed  CBC WITH DIFFERENTIAL - Abnormal; Notable for the following:    MCHC 36.9 (*)    Neutrophils Relative 82 (*)    All other components within normal limits  COMPREHENSIVE METABOLIC PANEL - Abnormal; Notable for the following:    Glucose, Bld 114 (*)    GFR calc non Af Amer 66 (*)    GFR calc Af Amer 76 (*)    All other components within normal limits  LIPASE, BLOOD  URINALYSIS, MICROSCOPIC ONLY   Ct Abdomen Pelvis Wo Contrast  02/21/2013  *RADIOLOGY REPORT*  Clinical Data: Right lower quadrant pain, nausea  CT ABDOMEN AND PELVIS WITHOUT CONTRAST  Technique:  Multidetector CT imaging of the abdomen and pelvis was performed following the standard protocol without intravenous contrast.  Comparison: None.  Findings: Mild dependent atelectasis at the lung bases.  Unenhanced liver, spleen, pancreas, and adrenal glands are within normal limits.  Gallbladder is unremarkable.  No intrahepatic  or extrahepatic ductal dilatation.  Kidneys are unremarkable.  No renal calculi or hydronephrosis.  No evidence of bowel obstruction.  Normal appendix.  No evidence of abdominal aortic aneurysm.  No abdominal ascites.  No suspicious abdominopelvic lymphadenopathy.  Prostate is unremarkable.  No ureteral or bladder calculi.  Numerous small lytic lesions throughout the visualized thoracolumbar spine (for example, sagittal image 70).  Ankylosis of the bilateral sacroiliac joints.  IMPRESSION: No renal, ureteral, or bladder calculi.  No hydronephrosis.  Normal appendix.  Numerous small lytic lesions throughout the visualized thoracolumbar spine.  Primary differential considerations are lytic metastases or multiple myeloma.   Original Report Authenticated By: Charline Bills, M.D.      1. Abdominal pain       MDM  52yo M presents w/ abd pain and nausea since early this morning.  On exam, afebrile, NAD, well-hydrated, abd benign w/ exception of epigastric ttp.  CT abd/pelvis ordered to r/o nephrolithiasis prior to my exam and is negative for acute pathology, but incidentally shows several small lytic lesions on thoracic and lumbar spine.  Pt denies back pain, weight loss, night sweats.  Consulted Oncology and they recommend CXR and then f/u with patient's PCP.  Dr. Rito Ehrlich w/ radiology does not believe that there would be any value in getting an MRI of spine.  All information relayed to patient.  He is currently receiving a GI cocktail, IV zofran, morphine and protonix.  U/A pending.  Will reassess shortly.  9:55 PM   U/A sig for ketonuria and increased specific gravity.  Pt required a dose of IV dilaudid and received 1L NS bolus as well.  Sx improved and he is tolerating pos.  D/c'd home w/ percocet, zofran and prilosec and advised f/u with PCP this week.  Strict return precautions discussed.       Otilio Miu, PA-C 02/22/13 [phone]

## 2013-02-21 NOTE — ED Notes (Signed)
Pt sts unable to void to provide sample

## 2013-02-21 NOTE — ED Notes (Signed)
Reports: RLQ pain acute onset this a.m at 2:30  Pt denies n/v/d and urinary symptoms. No change in diet.  Pt has not taken any meds for pain

## 2013-02-21 NOTE — ED Notes (Signed)
Pt sent here from [facility] to R/O kidney stone.  Acute onset RLQ pain since 0230 that is steadily increasing.  Pt still has appendix.  Pt c/o nausea, but unable to vomit.

## 2013-02-21 NOTE — ED Provider Notes (Signed)
History     CSN: [id number]  Arrival date & time 02/21/13  1519   First MD Initiated Contact with Patient 02/21/13 1605      Chief Complaint  Patient presents with  . Abdominal Pain    RLQ pain acute on set 2:30 a.m    (Consider location/radiation/quality/duration/timing/severity/associated sxs/prior treatment) Patient is a 52 y.o. male presenting with abdominal pain. The history is provided by the patient.  Abdominal Pain Pain location:  RLQ Pain quality: aching   Pain radiates to:  Does not radiate Pain severity:  Severe Timing:  Constant Progression:  Worsening Chronicity:  New Relieved by:  Nothing Ineffective treatments:  None tried Pt complains of sudden onset of severe right lower abdominal pain this am.   Pt denies any vomiting or diarrhea.  No fever or chills.    Past Medical History  Diagnosis Date  . Fasting hyperglycemia     NORMAL A1c  . Hyperlipidemia   . Testosterone deficiency     Dr Marcello Fennel    Past Surgical History  Procedure Laterality Date  . Lipomas removal      BENIGN FROM CHEST AND BACK  . Cervical plate insertion      POST COMPRESSION DISC INJURY    Family History  Problem Relation Age of Onset  . Cancer Mother     BREAST  . Heart disease Father 46    MI  . Kidney disease Brother     RENAL FAILURE  . Hypertension Brother   . Heart disease Maternal Aunt 65    MI;CABG  . Stroke Maternal Uncle     CVA    History  Substance Use Topics  . Smoking status: Never Smoker   . Smokeless tobacco: Not on file  . Alcohol Use: Yes     Comment:  rarely      Review of Systems  Gastrointestinal: Positive for abdominal pain.  All other systems reviewed and are negative.    Allergies  Review of patient's allergies indicates no known allergies.  Home Medications   Current Outpatient Rx  Name  Route  Sig  Dispense  Refill  . amLODipine (NORVASC) 5 MG tablet      TAKE ONE TABLET BY MOUTH EVERY DAY   30 tablet   0     Dispense  as written.   . simvastatin (ZOCOR) 20 MG tablet      TAKE ONE TABLET BY MOUTH EVERY DAY AT BEDTIME   90 tablet   1     BP 139/91  Pulse 89  Temp(Src) 100 F (37.8 C) (Oral)  Resp 16  SpO2 98%  Physical Exam  Nursing note and vitals reviewed. Constitutional: He appears well-developed and well-nourished.  HENT:  Head: Normocephalic.  Right Ear: External ear normal.  Eyes: Pupils are equal, round, and reactive to light.  Neck: Normal range of motion.  Cardiovascular: Normal rate and normal heart sounds.   Pulmonary/Chest: Effort normal.  Abdominal: Soft.  Musculoskeletal: Normal range of motion.  Neurological: He is alert.  Skin: Skin is warm.    ED Course  Procedures (including critical care time)  Labs Reviewed - No data to display No results found.   1. Abdominal pain       MDM  Pt given dilaudid and zofran,   I suspect kidney  stone due to onset and severity     [facility]   Lonia Skinner [place], [place] 02/21/13 1715

## 2013-02-22 NOTE — ED Provider Notes (Signed)
Medical screening examination/treatment/procedure(s) were performed by non-physician practitioner and as supervising physician I was immediately available for consultation/collaboration.  Ethelda Chick, MD 02/22/13 [phone]

## 2013-02-23 NOTE — ED Provider Notes (Signed)
Medical screening examination/treatment/procedure(s) were performed by resident physician or non-physician practitioner and as supervising physician I was immediately available for consultation/collaboration.   Delona Clasby DOUGLAS MD.   Deyanira Fesler D Candance Bohlman, MD 02/23/13 1946 

## 2013-02-27 ENCOUNTER — Ambulatory Visit: Payer: BC Managed Care – PPO | Admitting: Internal Medicine

## 2013-03-01 ENCOUNTER — Ambulatory Visit: Payer: BC Managed Care – PPO | Admitting: Internal Medicine

## 2013-03-03 ENCOUNTER — Encounter: Payer: Self-pay | Admitting: Internal Medicine

## 2013-03-03 ENCOUNTER — Ambulatory Visit (INDEPENDENT_AMBULATORY_CARE_PROVIDER_SITE_OTHER): Payer: BC Managed Care – PPO | Admitting: Internal Medicine

## 2013-03-03 VITALS — BP 124/82 | HR 59 | Wt 205.0 lb

## 2013-03-03 DIAGNOSIS — R937 Abnormal findings on diagnostic imaging of other parts of musculoskeletal system: Secondary | ICD-10-CM

## 2013-03-03 NOTE — Progress Notes (Signed)
  Subjective:    Patient ID: Jimmy Day, male    DOB: 17-Sep-1961, 52 y.o.   MRN: [id number]  HPI  The emergency room records of 02/21/13 were reviewed. He was seen for right lower quadrant pain and nausea.  Abnormal labs included a GFR of 76; nonfasting glucose of 114; and relative neutrophils of 82. Renal and hepatic function tests were normal. There was no anemia. Total protein was normal  CAT scan of abdomen and pelvis was negative except for possible small lytic lesions throughout the visualized thoracolumbar spine.  Past medical history was reviewed and updated. He had been receiving testosterone replacement from Dr. Patsi Sears. His last PSA on record was 0.97 in  2010  He has never smoked.  He has not had a colonoscopy to date    Review of Systems The abdominal pain has resolved. He denies fever, chills, sweats, unexplained weight loss, abdominal pain, melena, or rectal bleeding. He has no hematuria, dysuria, or pyuria. He denies any significant bone pain.       Objective:   Physical Exam Gen.: Healthy and well-nourished in appearance. Alert, appropriate and cooperative throughout exam.Appears younger than stated age  Head: Normocephalic without obvious abnormalities  Eyes: No corneal or conjunctival inflammation noted. No icterus Nose: External nasal exam reveals no deformity or inflammation. Nasal mucosa are pink and moist. No lesions or exudates noted.  Mouth: Oral mucosa and oropharynx reveal no lesions or exudates. Teeth in good repair. Neck: No deformities, masses, or tenderness noted. Thyroid normal. Lungs: Normal respiratory effort; chest expands symmetrically. Lungs are clear to auscultation without rales, wheezes, or increased work of breathing. Heart: Slow rate and regular rhythm. Normal S1 and S2. No gallop, click, or rub. No murmur. Abdomen: Bowel sounds normal; abdomen soft and nontender. No masses, organomegaly or hernias noted. Genitalia: Genitalia normal  except for left varices. Prostate is normal without enlargement, asymmetry, nodularity, or induration.                            Musculoskeletal/extremities: No deformity or scoliosis noted of  the thoracic or lumbar spine.   No clubbing, cyanosis, edema, or significant extremity  deformity noted. Range of motion normal .Tone & strength  Normal. oints  reveal mild  Flexion contracture L 5th finger.  Nail health good. Able to lie down & sit up w/o help. Negative SLR bilaterally Neurologic: Alert and oriented x3.  Skin: Intact without suspicious lesions or rashes. Lymph: No cervical, axillary, or inguinal lymphadenopathy present. Psych: Mood and affect are normal. Normally interactive                                                                                        Assessment & Plan:  #1 incidental finding of possible lytic lesions in the thoracolumbar spine. Films were reviewed with him and his wife.  Plan: MRI of the spine will be completed. Additional testing will depend on those results. This may include serum protein and immunoelectrophoresis as well as PSA.

## 2013-03-03 NOTE — Patient Instructions (Addendum)
Review and correct the record as indicated. Please share record with all medical staff seen.  If you activate the  My Chart system; lab & Xray results will be released directly  to you as soon as I review & address these through the computer. If you choose not to sign up for My Chart within 36 hours of labs being drawn; results will be reviewed & interpretation added before being copied & mailed, causing a delay in getting the results to you.If you do not receive that report within 7-10 days ,please call. Additionally you can use this system to gain direct  access to your records  if  out of town or @ an office of a  physician who is not in  the My Chart network.  This improves continuity of care & places you in control of your medical record.  

## 2013-03-06 ENCOUNTER — Telehealth: Payer: Self-pay | Admitting: Internal Medicine

## 2013-03-06 NOTE — Telephone Encounter (Signed)
I personally reviewed the CAT scan with him and his wife; the radiologist did question lytic lesions in the spine suggestive of possible myeloma. The MRI is critical to define whether the multiple lesions in the represent pathologic lesions. There are too many lucencies at different levels for this to be an artifact.  Please help facilitate this "peer-to-peer" discussion

## 2013-03-06 NOTE — Telephone Encounter (Signed)
I placed a call to [employer] to get pre-authorization for MRI LS and MRI TS.  I provided them all clinical information, and the recent imaging impression.  [employer] could not approve these requests at the clinical level by phone.  A Peer to Peer is now required by calling [phone], select Option 2.  Reference patient's ID# [id number].  Case will remain open for only 3 business days.

## 2013-03-08 NOTE — Telephone Encounter (Signed)
Patient's insurance called me and stated after one of their Physicians reviewed all the information I provided them that both MRIs have been approved.  Patient is scheduled and is aware.

## 2013-03-09 ENCOUNTER — Ambulatory Visit (HOSPITAL_COMMUNITY): Payer: BC Managed Care – PPO

## 2013-03-10 ENCOUNTER — Ambulatory Visit (HOSPITAL_COMMUNITY): Payer: BC Managed Care – PPO

## 2013-03-10 ENCOUNTER — Ambulatory Visit (HOSPITAL_COMMUNITY): Admission: RE | Admit: 2013-03-10 | Payer: BC Managed Care – PPO | Source: Ambulatory Visit

## 2013-03-18 ENCOUNTER — Other Ambulatory Visit: Payer: Self-pay | Admitting: Cardiovascular Disease

## 2013-03-21 ENCOUNTER — Telehealth: Payer: Self-pay

## 2013-03-21 NOTE — Telephone Encounter (Signed)
When pt was last seen Dr Aundra Dubin told pt to return to him PRN.

## 2013-03-21 NOTE — Telephone Encounter (Signed)
Primary care should be able to fill this for him.  He is to follow up with [place] as needed and does not need appt unless he is having problems.

## 2013-03-21 NOTE — Telephone Encounter (Signed)
Pt is wanting a refill on his med . Its been two years should the pt come in ?

## 2013-03-22 ENCOUNTER — Other Ambulatory Visit: Payer: Self-pay

## 2013-04-24 ENCOUNTER — Other Ambulatory Visit: Payer: Self-pay | Admitting: Cardiovascular Disease

## 2013-06-01 ENCOUNTER — Other Ambulatory Visit: Payer: Self-pay | Admitting: Cardiovascular Disease

## 2013-07-10 ENCOUNTER — Other Ambulatory Visit: Payer: Self-pay | Admitting: Cardiovascular Disease

## 2013-07-12 ENCOUNTER — Other Ambulatory Visit: Payer: Self-pay | Admitting: *Deleted

## 2013-07-12 NOTE — Telephone Encounter (Signed)
LEFT MESSAGE WITH PT  THAT DR HOPPER NEEDS TO FILL MED AS WELL AS PHARMACY  NOTIFIED TO CALL PMD FOR REFILL ./CU

## 2013-07-12 NOTE — Telephone Encounter (Signed)
I would have him call Dr. Caryl Never office for refill of Norvasc since we have not followed him, last visit 2012 with prn f/u only. Thanks, chris

## 2013-07-25 ENCOUNTER — Telehealth: Payer: Self-pay | Admitting: Internal Medicine

## 2013-07-25 MED ORDER — AMLODIPINE BESYLATE 5 MG PO TABS: ORAL_TABLET | ORAL | Status: DC

## 2013-07-25 NOTE — Telephone Encounter (Signed)
Patient states he needs rx for amlodipine. He states Dr. Gibson Ramp office will not fill it because he hasn't been seen in a year. Pt uses [facility] [place] CB# [phone]

## 2013-07-25 NOTE — Telephone Encounter (Signed)
Med filled.  

## 2013-08-07 ENCOUNTER — Encounter: Payer: Self-pay | Admitting: Internal Medicine

## 2013-08-07 ENCOUNTER — Telehealth: Payer: Self-pay | Admitting: Internal Medicine

## 2013-08-07 ENCOUNTER — Ambulatory Visit (INDEPENDENT_AMBULATORY_CARE_PROVIDER_SITE_OTHER): Payer: BC Managed Care – PPO | Admitting: Internal Medicine

## 2013-08-07 VITALS — BP 136/82 | HR 59 | Temp 98.0°F | Resp 12 | Wt 204.8 lb

## 2013-08-07 DIAGNOSIS — E785 Hyperlipidemia, unspecified: Secondary | ICD-10-CM

## 2013-08-07 DIAGNOSIS — R937 Abnormal findings on diagnostic imaging of other parts of musculoskeletal system: Secondary | ICD-10-CM

## 2013-08-07 DIAGNOSIS — K5900 Constipation, unspecified: Secondary | ICD-10-CM

## 2013-08-07 DIAGNOSIS — I10 Essential (primary) hypertension: Secondary | ICD-10-CM

## 2013-08-07 LAB — HEPATIC FUNCTION PANEL
ALT: 14 U/L (ref 0–53)
Bilirubin, Direct: 0 mg/dL (ref 0.0–0.3)
Total Bilirubin: 0.8 mg/dL (ref 0.3–1.2)
Total Protein: 7.3 g/dL (ref 6.0–8.3)

## 2013-08-07 LAB — LIPID PANEL
Cholesterol: 169 mg/dL (ref 0–200)
HDL: 34.6 mg/dL — ABNORMAL LOW (ref >39.00)
Triglycerides: 203 mg/dL — ABNORMAL HIGH (ref 0.0–149.0)

## 2013-08-07 LAB — BASIC METABOLIC PANEL
BUN: 11 mg/dL (ref 6–23)
CO2: 25 mEq/L (ref 19–32)
Chloride: 101 mEq/L (ref 96–112)
Creatinine, Ser: 1.3 mg/dL (ref 0.4–1.5)
Potassium: 3.4 mEq/L — ABNORMAL LOW (ref 3.5–5.1)

## 2013-08-07 LAB — CBC WITH DIFFERENTIAL/PLATELET
Basophils Relative: 0.7 % (ref 0.0–3.0)
Eosinophils Absolute: 0.1 10*3/uL (ref 0.0–0.7)
Eosinophils Relative: 2.4 % (ref 0.0–5.0)
HCT: 45.6 % (ref 39.0–52.0)
Lymphs Abs: 2 10*3/uL (ref 0.7–4.0)
MCHC: 33.7 g/dL (ref 30.0–36.0)
MCV: 92.6 fl (ref 78.0–100.0)
Monocytes Absolute: 0.3 10*3/uL (ref 0.1–1.0)
Neutrophils Relative %: 50 % (ref 43.0–77.0)
Platelets: 209 10*3/uL (ref 150.0–400.0)

## 2013-08-07 LAB — TSH: TSH: 1.08 u[IU]/mL (ref 0.35–5.50)

## 2013-08-07 MED ORDER — SIMVASTATIN 40 MG PO TABS: 40.0000 mg | ORAL_TABLET | Freq: Every evening | ORAL | Status: DC

## 2013-08-07 MED ORDER — AMLODIPINE BESYLATE 5 MG PO TABS: 5.0000 mg | ORAL_TABLET | Freq: Every day | ORAL | Status: DC

## 2013-08-07 MED ORDER — ATORVASTATIN CALCIUM 20 MG PO TABS: 20.0000 mg | ORAL_TABLET | Freq: Every day | ORAL | Status: DC

## 2013-08-07 NOTE — Progress Notes (Signed)
  Subjective:    Patient ID: Jimmy Day, male    DOB: 1961-11-02, 52 y.o.   MRN: [id number]  HPI   He is here for refill of his blood pressure medication and statin. He's been off of medications as noted in the problem list.  Because of a high deductible and lack of symptoms; he has not followed up on the abnormal spine findings.    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 Specifically he denies chest pain, palpitations, dyspnea, or claudication.  Family history is positive for premature coronary disease. Advanced cholesterol testing reveals his LDL goal is less than 100. There is medication non compliance with the statin as noted.  Significant abdominal symptoms, memory deficit, or myalgias denied.  He denies fever, chills, sweats, weight loss. He has no pain in his spine. He also denies numbness, tingling, weakness in his extremities. He has no loss control of his bladder or bowels.  He does complain of constipation; Dr Marcello Fennel Rxed a powder which helps..     Objective:   Physical Exam  Gen.: Healthy and well-nourished in appearance. Alert, appropriate and cooperative throughout exam.Appears younger than stated age  Head: Normocephalic without obvious abnormalities. Eyes: No corneal or conjunctival inflammation noted. No icterus Mouth: Oral mucosa and oropharynx reveal no lesions or exudates. Teeth in good repair. Neck: No deformities, masses, or tenderness noted. Range of motion & Thyroid normal. Lungs: Normal respiratory effort; chest expands symmetrically. Lungs are clear to auscultation without rales, wheezes, or increased work of breathing. Heart: Normal rate and rhythm. Normal S1 and S2. No gallop, click, or rub. No murmur. Abdomen: Bowel sounds normal; abdomen soft and nontender. No masses, organomegaly or hernias noted. Genitalia:As per Dr Marcello Fennel                                   Musculoskeletal/extremities: No deformity or scoliosis noted of  the thoracic or lumbar spine. No tenderness to percussion over the spine No clubbing, cyanosis, edema, or significant extremity  deformity noted. Range of motion normal .Tone & strength  Normal. Joints normal . Nail health good. Able to lie down & sit up w/o help. Negative SLR bilaterally Vascular: Carotid, radial artery, dorsalis pedis and  posterior tibial pulses are full and equal. No bruits present. Neurologic: Alert and oriented x3. Deep tendon reflexes symmetrical and normal.          Skin: Intact without suspicious lesions or rashes. Lymph: No cervical, axillary lymphadenopathy present. Psych: Mood and affect are normal. Normally interactive                                                                                        Assessment & Plan:  #1See Current Assessment & Plan in Problem List under specific Diagnosis #2 constipation

## 2013-08-07 NOTE — Assessment & Plan Note (Signed)
His blood pressure is actually very good off the medication. Blood pressure goals discussed. Prescription written if average is greater than 140/90.

## 2013-08-07 NOTE — Assessment & Plan Note (Signed)
Fasting lipids, hepatic panel, and TSH

## 2013-08-07 NOTE — Telephone Encounter (Signed)
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 Patient was told to call his PCP about this and is calling to see what Dr. Alwyn Ren advises him to do.

## 2013-08-07 NOTE — Assessment & Plan Note (Signed)
He is asymptomatic and exam was unremarkable. CBC and differential will be checked. His urologist monitors his prostate

## 2013-08-07 NOTE — Patient Instructions (Addendum)
Miralax every 3 rd day as needed for constipation. Minimal Blood Pressure Goal= AVERAGE < 140/90;  Ideal is an AVERAGE < 135/85. This AVERAGE should be calculated from @ least 5-7 BP readings taken @ different times of day on different days of week. You should not respond to isolated BP readings , but rather the AVERAGE for that week .Please bring your  blood pressure cuff to office visits to verify that it is reliable.It  can also be checked against the blood pressure device at the pharmacy. Finger or wrist cuffs are not dependable; an arm cuff is.  If you activate the  My Chart system; lab & Xray results will be released directly  to you as soon as I review & address these through the computer. If you choose not to sign up for My Chart within 36 hours of labs being drawn; results will be reviewed & interpretation added before being copied & mailed, causing a delay in getting the results to you.If you do not receive that report within 7-10 days ,please call. Additionally you can use this system to gain direct  access to your records  if  out of town or @ an office of a  physician who is not in  the My Chart network.  This improves continuity of care & places you in control of your medical record.

## 2013-08-09 ENCOUNTER — Encounter: Payer: Self-pay | Admitting: *Deleted

## 2013-08-09 ENCOUNTER — Telehealth: Payer: Self-pay | Admitting: *Deleted

## 2013-08-09 NOTE — Telephone Encounter (Signed)
Letter sent. DJR  

## 2013-08-09 NOTE — Telephone Encounter (Signed)
Message copied by Eustace Quail on Wed Aug 09, 2013  1:25 PM ------      Message from: Pecola Lawless      Created: Wed Aug 09, 2013 12:57 PM       To increase  Potassium (K+) increase citrus fruits & bananas in diet and use the salt substitute No Salt, which contains  potassium , to season food @ the table. Recheck K+ after 6 weeks .       The most common cause of elevated triglycerides (TG) is the ingestion of sugar from high fructose corn syrup sources added to processed foods & drinks.  Eat a low-fat diet with lots of fruits and vegetables, up to 7-9 servings per day. Consume less than 40 Grams (preferably ZERO) of sugar per day from foods & drinks with High Fructose Corn Syrup (HFCS) sugar as #1,2,3 or # 4 on label.Whole Foods, [employer] & [employer] do not carry products with HFCS.      Interventions to raise HDL or GOOD cholesterol include: exercising 30-45 minutes 3-4 X per week; including salmon & tuna in the diet;  & supplementing with Omega 3 fatty acids (Flax Seed or Fish oil )  1-2 grams per day.      The B vitamin Niacin also raises HDL but has a vasodilating effect which may cause flushing.To date extensive trials have NOT proven benefit of attempting to raise HDL with Niacin.      All other lab results are excellent. Hopp                   ------

## 2013-08-14 NOTE — Telephone Encounter (Signed)
I recommend a Urology consultation to determine optimal therapy; please inform me if you need a referral

## 2013-08-14 NOTE — Telephone Encounter (Signed)
LM @ (3:31pm) asking the pt to RTC regarding blood in the urine.//AB/CMA

## 2013-08-14 NOTE — Telephone Encounter (Signed)
Spoke with the pt and he stated the PE was after he was in here for his  appt with Dr. Alwyn Ren.  Asked the pt if he is having any sxs or have noticed any blood and he stated no sxs or blood.  Asked the pt if he contacted his Urologist and he stated he called them but he did not make.  Please advise.//AB/CMA

## 2013-08-17 ENCOUNTER — Telehealth: Payer: Self-pay | Admitting: *Deleted

## 2013-08-17 NOTE — Telephone Encounter (Signed)
Left message for patient to return our call regarding the blood in his urine. JG

## 2013-08-23 NOTE — Telephone Encounter (Signed)
LM @ (3:21pm) asking the pt to RTC regarding blood in the urine.//AB/CMA

## 2013-08-28 NOTE — Telephone Encounter (Signed)
LM @ (3:31pm) asking the pt to RTC regarding note about blood in his urine.//AB/CMA

## 2013-09-01 ENCOUNTER — Encounter: Payer: Self-pay | Admitting: *Deleted

## 2013-09-01 NOTE — Telephone Encounter (Signed)
Unable to reach the pt by phone.  Mailed letter asking the pt to contact the office regarding note below about blood in his urine.//AB/CMA

## 2013-09-11 ENCOUNTER — Telehealth: Payer: Self-pay | Admitting: Internal Medicine

## 2013-09-11 ENCOUNTER — Other Ambulatory Visit: Payer: Self-pay | Admitting: Internal Medicine

## 2013-09-11 DIAGNOSIS — E876 Hypokalemia: Secondary | ICD-10-CM

## 2013-09-11 NOTE — Telephone Encounter (Signed)
Left message to return our call to schedule labs. (Due 09/20/13) Orders have already been placed.

## 2013-09-11 NOTE — Telephone Encounter (Signed)
Patient called and stated that they are okay now regarding the urine in there blood. They went and saw another physician. Also made an apt for November 5th @ 8:15am for labs (lipid panel). Please advise me with lab order. thanks

## 2013-09-11 NOTE — Telephone Encounter (Signed)
Please advise 

## 2013-09-11 NOTE — Telephone Encounter (Signed)
Attempted to contact pt regarding repeat labs. In reviewing chart, pt needs to have K+ rechecked around 09/20/13, orders have already been placed.

## 2013-09-11 NOTE — Telephone Encounter (Signed)
Please see comments on his most recent labs. His potassium was 3.4 ;he was to return to have it rechecked.

## 2013-09-28 ENCOUNTER — Other Ambulatory Visit: Payer: Self-pay

## 2013-09-29 ENCOUNTER — Other Ambulatory Visit (INDEPENDENT_AMBULATORY_CARE_PROVIDER_SITE_OTHER): Payer: BC Managed Care – PPO

## 2013-09-29 DIAGNOSIS — E785 Hyperlipidemia, unspecified: Secondary | ICD-10-CM

## 2013-09-29 DIAGNOSIS — E876 Hypokalemia: Secondary | ICD-10-CM

## 2013-09-29 LAB — LIPID PANEL
Cholesterol: 115 mg/dL (ref 0–200)
LDL Cholesterol: 53 mg/dL (ref 0–99)
Total CHOL/HDL Ratio: 3
Triglycerides: 137 mg/dL (ref 0.0–149.0)
VLDL: 27.4 mg/dL (ref 0.0–40.0)

## 2014-01-28 ENCOUNTER — Inpatient Hospital Stay (HOSPITAL_COMMUNITY)
Admission: EM | Admit: 2014-01-28 | Discharge: 2014-01-31 | DRG: 392 | Disposition: A | Discharge status: Home / Self Care | Payer: BC Managed Care – PPO | Attending: Internal Medicine | Admitting: Internal Medicine

## 2014-01-28 ENCOUNTER — Encounter: Payer: Self-pay | Admitting: Internal Medicine

## 2014-01-28 ENCOUNTER — Emergency Department (HOSPITAL_COMMUNITY): Payer: BC Managed Care – PPO

## 2014-01-28 ENCOUNTER — Observation Stay (HOSPITAL_COMMUNITY): Payer: BC Managed Care – PPO

## 2014-01-28 ENCOUNTER — Encounter (HOSPITAL_COMMUNITY): Payer: Self-pay | Admitting: Emergency Medicine

## 2014-01-28 DIAGNOSIS — M949 Disorder of cartilage, unspecified: Secondary | ICD-10-CM

## 2014-01-28 DIAGNOSIS — C9000 Multiple myeloma not having achieved remission: Secondary | ICD-10-CM | POA: Insufficient documentation

## 2014-01-28 DIAGNOSIS — E291 Testicular hypofunction: Secondary | ICD-10-CM

## 2014-01-28 DIAGNOSIS — N289 Disorder of kidney and ureter, unspecified: Secondary | ICD-10-CM | POA: Diagnosis present

## 2014-01-28 DIAGNOSIS — I10 Essential (primary) hypertension: Secondary | ICD-10-CM | POA: Diagnosis present

## 2014-01-28 DIAGNOSIS — M899 Disorder of bone, unspecified: Secondary | ICD-10-CM

## 2014-01-28 DIAGNOSIS — K5900 Constipation, unspecified: Principal | ICD-10-CM | POA: Diagnosis present

## 2014-01-28 DIAGNOSIS — Z8249 Family history of ischemic heart disease and other diseases of the circulatory system: Secondary | ICD-10-CM

## 2014-01-28 DIAGNOSIS — M898X9 Other specified disorders of bone, unspecified site: Secondary | ICD-10-CM | POA: Diagnosis present

## 2014-01-28 DIAGNOSIS — R1013 Epigastric pain: Secondary | ICD-10-CM

## 2014-01-28 DIAGNOSIS — R109 Unspecified abdominal pain: Secondary | ICD-10-CM

## 2014-01-28 DIAGNOSIS — R9431 Abnormal electrocardiogram [ECG] [EKG]: Secondary | ICD-10-CM

## 2014-01-28 DIAGNOSIS — R7309 Other abnormal glucose: Secondary | ICD-10-CM

## 2014-01-28 DIAGNOSIS — R937 Abnormal findings on diagnostic imaging of other parts of musculoskeletal system: Secondary | ICD-10-CM

## 2014-01-28 DIAGNOSIS — E785 Hyperlipidemia, unspecified: Secondary | ICD-10-CM | POA: Diagnosis present

## 2014-01-28 HISTORY — DX: Essential (primary) hypertension: I10

## 2014-01-28 LAB — COMPREHENSIVE METABOLIC PANEL
ALT: 17 U/L (ref 0–53)
AST: 21 U/L (ref 0–37)
Albumin: 4.3 g/dL (ref 3.5–5.2)
Alkaline Phosphatase: 54 U/L (ref 39–117)
BUN: 12 mg/dL (ref 6–23)
CALCIUM: 9.9 mg/dL (ref 8.4–10.5)
CO2: 25 mEq/L (ref 19–32)
CREATININE: 1.39 mg/dL — AB (ref 0.50–1.35)
Chloride: 99 mEq/L (ref 96–112)
GFR calc non Af Amer: 57 mL/min — ABNORMAL LOW (ref >90)
GFR, EST AFRICAN AMERICAN: 66 mL/min — AB (ref >90)
GLUCOSE: 112 mg/dL — AB (ref 70–99)
Potassium: 3.7 mEq/L (ref 3.7–5.3)
Sodium: 142 mEq/L (ref 137–147)
Total Bilirubin: 0.5 mg/dL (ref 0.3–1.2)
Total Protein: 7.8 g/dL (ref 6.0–8.3)

## 2014-01-28 LAB — ABO/RH: ABO/RH(D): O POS

## 2014-01-28 LAB — RAPID URINE DRUG SCREEN, HOSP PERFORMED
AMPHETAMINES: NOT DETECTED
BARBITURATES: NOT DETECTED
Benzodiazepines: NOT DETECTED
Cocaine: NOT DETECTED
Opiates: POSITIVE — AB
TETRAHYDROCANNABINOL: NOT DETECTED

## 2014-01-28 LAB — CBC
HCT: 47.9 % (ref 39.0–52.0)
HEMOGLOBIN: 17.4 g/dL — AB (ref 13.0–17.0)
MCH: 32.1 pg (ref 26.0–34.0)
MCHC: 36.3 g/dL — AB (ref 30.0–36.0)
MCV: 88.4 fL (ref 78.0–100.0)
Platelets: 197 10*3/uL (ref 150–400)
RBC: 5.42 MIL/uL (ref 4.22–5.81)
RDW: 13.3 % (ref 11.5–15.5)
WBC: 6.6 10*3/uL (ref 4.0–10.5)

## 2014-01-28 LAB — MAGNESIUM: Magnesium: 2.1 mg/dL (ref 1.5–2.5)

## 2014-01-28 LAB — LIPASE, BLOOD: LIPASE: 30 U/L (ref 11–59)

## 2014-01-28 LAB — I-STAT TROPONIN, ED: TROPONIN I, POC: 0.01 ng/mL (ref 0.00–0.08)

## 2014-01-28 LAB — TYPE AND SCREEN
ABO/RH(D): O POS
Antibody Screen: NEGATIVE

## 2014-01-28 LAB — SODIUM, URINE, RANDOM: SODIUM UR: 110 meq/L

## 2014-01-28 LAB — LACTATE DEHYDROGENASE: LDH: 182 U/L (ref 94–250)

## 2014-01-28 LAB — TROPONIN I: Troponin I: <0.3 ng/mL (ref <0.30)

## 2014-01-28 LAB — CREATININE, URINE, RANDOM: Creatinine, Urine: 40.56 mg/dL

## 2014-01-28 LAB — I-STAT CG4 LACTIC ACID, ED: LACTIC ACID, VENOUS: 3.8 mmol/L — AB (ref 0.5–2.2)

## 2014-01-28 MED ORDER — INFLUENZA VAC SPLIT QUAD 0.5 ML IM SUSP
0.5000 mL | INTRAMUSCULAR | Status: DC
  Filled 2014-01-28: qty 0.5

## 2014-01-28 MED ORDER — HYDROMORPHONE HCL PF 1 MG/ML IJ SOLN
1.0000 mg | Freq: Once | INTRAMUSCULAR | Status: AC
  Administered 2014-01-28: 1 mg via INTRAVENOUS
  Filled 2014-01-28: qty 1

## 2014-01-28 MED ORDER — ONDANSETRON HCL 4 MG/2ML IJ SOLN
4.0000 mg | Freq: Four times a day (QID) | INTRAMUSCULAR | Status: DC | PRN
  Administered 2014-01-29: 4 mg via INTRAVENOUS
  Filled 2014-01-28: qty 2

## 2014-01-28 MED ORDER — IOHEXOL 350 MG/ML SOLN
100.0000 mL | Freq: Once | INTRAVENOUS | Status: AC | PRN
  Administered 2014-01-28: 100 mL via INTRAVENOUS

## 2014-01-28 MED ORDER — FENTANYL CITRATE 0.05 MG/ML IJ SOLN
INTRAMUSCULAR | Status: AC
  Filled 2014-01-28: qty 2

## 2014-01-28 MED ORDER — PANTOPRAZOLE SODIUM 40 MG IV SOLR
40.0000 mg | Freq: Two times a day (BID) | INTRAVENOUS | Status: DC
  Administered 2014-01-28: 40 mg via INTRAVENOUS
  Filled 2014-01-28 (×3): qty 40

## 2014-01-28 MED ORDER — ATORVASTATIN CALCIUM 20 MG PO TABS
20.0000 mg | ORAL_TABLET | Freq: Every day | ORAL | Status: DC
  Administered 2014-01-29 – 2014-01-31 (×3): 20 mg via ORAL
  Filled 2014-01-28 (×4): qty 1

## 2014-01-28 MED ORDER — DEXTROSE-NACL 5-0.45 % IV SOLN
INTRAVENOUS | Status: AC
  Administered 2014-01-28 (×2): via INTRAVENOUS
  Administered 2014-01-29: 125 mL/h via INTRAVENOUS

## 2014-01-28 MED ORDER — DIPHENHYDRAMINE HCL 50 MG/ML IJ SOLN
12.5000 mg | Freq: Four times a day (QID) | INTRAMUSCULAR | Status: DC | PRN
  Administered 2014-01-29: 12.5 mg via INTRAVENOUS
  Filled 2014-01-28: qty 1

## 2014-01-28 MED ORDER — SODIUM CHLORIDE 0.9 % IJ SOLN
3.0000 mL | Freq: Two times a day (BID) | INTRAMUSCULAR | Status: DC
  Administered 2014-01-29 – 2014-01-30 (×2): 3 mL via INTRAVENOUS

## 2014-01-28 MED ORDER — PANTOPRAZOLE SODIUM 40 MG IV SOLR
40.0000 mg | Freq: Once | INTRAVENOUS | Status: AC
  Administered 2014-01-28: 40 mg via INTRAVENOUS
  Filled 2014-01-28: qty 40

## 2014-01-28 MED ORDER — HYDROMORPHONE HCL PF 1 MG/ML IJ SOLN
2.0000 mg | INTRAMUSCULAR | Status: DC | PRN
  Administered 2014-01-28 – 2014-01-29 (×5): 2 mg via INTRAVENOUS
  Filled 2014-01-28 (×5): qty 2

## 2014-01-28 MED ORDER — FENTANYL CITRATE 0.05 MG/ML IJ SOLN
100.0000 ug | Freq: Once | INTRAMUSCULAR | Status: AC
  Administered 2014-01-28: 100 ug via INTRAVENOUS

## 2014-01-28 MED ORDER — ONDANSETRON HCL 4 MG PO TABS: 4.0000 mg | ORAL_TABLET | Freq: Four times a day (QID) | ORAL | Status: DC | PRN

## 2014-01-28 MED ORDER — MORPHINE SULFATE 4 MG/ML IJ SOLN
4.0000 mg | Freq: Once | INTRAMUSCULAR | Status: AC
  Administered 2014-01-28: 4 mg via INTRAVENOUS
  Filled 2014-01-28: qty 1

## 2014-01-28 MED ORDER — POLYETHYLENE GLYCOL 3350 17 G PO PACK
17.0000 g | PACK | Freq: Every day | ORAL | Status: DC | PRN
  Administered 2014-01-29: 17 g via ORAL
  Filled 2014-01-28 (×2): qty 1

## 2014-01-28 NOTE — H&P (Signed)
[facility] History and Physical  Jimmy Day KPT:[id number] DOB: [birth year] DOA: 01/28/2014  Referring physician: Dr. Gwendolyn Grant PCP: Marga Melnick, MD   Chief Complaint: Abdominal pain  HPI: Jimmy Day is a 53 y.o. male  Past medical history of hypertension and also remote history of Lytic lesion by previous CT scan on 02/21/2013 that comes in for sudden onset of epigastric abdominal pain nothing makes it better or worst, no radiation. Has had 2 bowel movements, no melanotic stools. No recent travels history, no fever, chills, nausea, vomiting, diarrhea. Denies chest pain shortness of breath. Nonradiating. Denies heavy alcohol abuse he relates is only on occasions no binge drinking.  In the ED: A basic metabolic panel was done that shows a creatinine 1.3 (previous creatinine 1.2), lactic acid 3.8 CBC with a normal white count hemoglobin of 17, CT angiogram of the chest abdomen and pelvis as below. First set of cardiac enzymes negative an EKG is below. Lipase and LFTs within normal limits with a calcium of 9.9.   Review of Systems:  Constitutional:  No weight loss, night sweats, Fevers, chills, fatigue.  HEENT:  No headaches, Difficulty swallowing,Tooth/dental problems,Sore throat,  No sneezing, itching, ear ache, nasal congestion, post nasal drip,  Cardio-vascular:  No chest pain, Orthopnea, PND, swelling in lower extremities, anasarca, dizziness, palpitations  GI:  No heartburn, indigestion, abdominal pain, nausea, vomiting, diarrhea, change in bowel habits, loss of appetite  Resp:  No shortness of breath with exertion or at rest. No excess mucus, no productive cough, No non-productive cough, No coughing up of blood.No change in color of mucus.No wheezing.No chest wall deformity  Skin:  no rash or lesions.  GU:  no dysuria, change in color of urine, no urgency or frequency. No flank pain.  Musculoskeletal:  No joint pain or swelling. No decreased range of motion. No back  pain.  Psych:  No change in mood or affect. No depression or anxiety. No memory loss.   Past Medical History  Diagnosis Date  . Fasting hyperglycemia     NORMAL A1c  . Hyperlipidemia   . Testosterone deficiency     Dr Marcello Fennel  . Hypertension    Past Surgical History  Procedure Laterality Date  . Lipomas removal      BENIGN FROM CHEST AND BACK  . Cervical plate insertion      POST COMPRESSION DISC INJURY   Social History:  reports that he has never smoked. He does not have any smokeless tobacco history on file. He reports that he drinks alcohol. He reports that he does not use illicit drugs.  No Known Allergies  Family History  Problem Relation Age of Onset  . Breast cancer Mother   . Heart attack Father 3  . Kidney disease Brother     RENAL FAILURE  . Hypertension Brother   . Heart attack Maternal Aunt 65    CABG  . Stroke Maternal Uncle     CVA  . Diabetes Neg Hx      Prior to Admission medications   Medication Sig Start Date End Date Taking? Authorizing Provider  amLODipine (NORVASC) 5 MG tablet Take 5 mg by mouth daily.   Yes Historical Provider, MD  atorvastatin (LIPITOR) 20 MG tablet Take 1 tablet (20 mg total) by mouth daily. 08/07/13  Yes Pecola Lawless, MD  Pseudoeph-CPM-DM-APAP (TYLENOL COLD PO) Take 2 tablets by mouth daily as needed (cold / congestion).   Yes Historical Provider, MD   Physical Exam: Filed Vitals:  01/28/14 1407  BP: 129/89  Pulse: 77  Resp: 18    BP 129/89  Pulse 77  Resp 18  SpO2 95%  General:  Appears calm and comfortable Eyes: PERRL, normal lids, irises & conjunctiva ENT: grossly normal hearing, lips & tongue Neck: no LAD, masses or thyromegaly Cardiovascular: RRR, no m/r/g. No LE edema. Telemetry: SR, no arrhythmias  Respiratory: CTA bilaterally, no w/r/r. Normal respiratory effort. Abdomen: soft, epigastric tenderness no rebound no guarding Murphy sign negative Murphy sign negative Skin: no rash or induration seen  on limited exam Musculoskeletal: grossly normal tone BUE/BLE Psychiatric: grossly normal mood and affect, speech fluent and appropriate Neurologic: grossly non-focal.          Labs on Admission:  Basic Metabolic Panel:  Recent Labs Lab 01/28/14 1229  NA 142  K 3.7  CL 99  CO2 25  GLUCOSE 112*  BUN 12  CREATININE 1.39*  CALCIUM 9.9   Liver Function Tests:  Recent Labs Lab 01/28/14 1229  AST 21  ALT 17  ALKPHOS 54  BILITOT 0.5  PROT 7.8  ALBUMIN 4.3    Recent Labs Lab 01/28/14 1229  LIPASE 30   No results found for this basename: AMMONIA,  in the last 168 hours CBC:  Recent Labs Lab 01/28/14 1229  WBC 6.6  HGB 17.4*  HCT 47.9  MCV 88.4  PLT 197   Cardiac Enzymes: No results found for this basename: CKTOTAL, CKMB, CKMBINDEX, TROPONINI,  in the last 168 hours  BNP (last 3 results) No results found for this basename: PROBNP,  in the last 8760 hours CBG: No results found for this basename: GLUCAP,  in the last 168 hours  Radiological Exams on Admission: Ct Angio Chest Aortic Dissect W &/or W/o  01/28/2014   CLINICAL DATA:  Sudden onset of tearing abdominal pain. Possible aortic dissection.  EXAM: CT ANGIOGRAPHY CHEST, ABDOMEN AND PELVIS  TECHNIQUE: Multidetector CT imaging through the chest, abdomen and pelvis was performed using the standard protocol during bolus administration of intravenous contrast. Multiplanar reconstructed images and MIPs were obtained and reviewed to evaluate the vascular anatomy.  CONTRAST:  131mL OMNIPAQUE IOHEXOL 350 MG/ML SOLN  COMPARISON:  CT of the abdomen and pelvis 02/21/2013.  FINDINGS: CTA CHEST FINDINGS  Mediastinum: No acute abnormality of the thoracic aorta; specifically, no aneurysm or dissection. Additionally on precontrast images there is no crescentic high attenuation associated with the wall of the thoracic aorta to suggest acute intramural hemorrhage. Heart size is normal. There is no significant pericardial fluid,  thickening or pericardial calcification. No pathologically enlarged mediastinal or hilar lymph nodes. Esophagus is unremarkable in appearance.  Lungs/Pleura: Assessment of the lung parenchyma is limited by considerable patient motion. With these limitations in mind there are no definite large suspicious appearing pulmonary nodules or masses identified. No acute consolidative airspace disease. No pleural effusions. Although the examination was not specifically tailored to evaluate for pulmonary embolism, opacification of the pulmonary arteries is sufficient to is exclude the presence of clinically relevant central, lobar or segmental sized pulmonary emboli. Minimal subsegmental atelectasis in the lower lobes of the lungs bilaterally.  Musculoskeletal: Innumerable lucent lesions are seen scattered throughout the visualized axial and appendicular skeleton.  Review of the MIP images confirms the above findings.  CTA ABDOMEN AND PELVIS FINDINGS  Abdomen/Pelvis: No acute abnormality of the abdominal aorta or major abdominal and pelvic vasculature. Specifically, no evidence of acute intracranial hemorrhage, dissection or aneurysm. Celiac axis, superior mesenteric artery and inferior mesenteric artery  is well as their major branches are all widely patent. Single renal arteries are noted bilaterally.  The appearance of the liver, gallbladder, pancreas, spleen, bilateral adrenal glands and bilateral kidneys is unremarkable. No significant volume of ascites. No pneumoperitoneum. No pathologic distention of small bowel. No definite pathologic will discretion no definite lymphadenopathy identified within the abdomen or pelvis.  Musculoskeletal: Innumerable small lytic lesions are again noted throughout the visualized axial and appendicular skeleton. Some of these appear similar to the prior study, while others are new and some have clearly enlarged. The best example of an enlarging lesion is in the posteromedial aspect of the  left femoral neck on image 308 of series 6 where there is a lesion that now demonstrates significant endosteal scalloping bordering on frank cortical destruction. No definite associated pathologic fractures are identified at this time.  Review of the MIP images confirms the above findings.  IMPRESSION: 1. No acute abnormality of the thoracic abdominal aorta. 2. Widespread lytic lesions throughout the visualized axial and appendicular skeleton, clearly progressed compared to the prior examination, highly concerning for systemic disease such as multiple myeloma. Alternatively, this could represent widespread metastatic disease from an unknown primary. Hematology referral for work up for potential multiple myeloma is strongly advised at this time. Should that workup reveal no evidence of multiple myeloma, further evaluation with PET-CT may be warranted to assess for potential occult primary malignancy. 3. No definite acute findings in the chest, abdomen or pelvis to account for the patient's symptoms.   Electronically Signed   By: Vinnie Langton M.D.   On: 01/28/2014 13:27   Ct Angio Abd/pel W/ And/or W/o  01/28/2014   CLINICAL DATA:  Sudden onset of tearing abdominal pain. Possible aortic dissection.  EXAM: CT ANGIOGRAPHY CHEST, ABDOMEN AND PELVIS  TECHNIQUE: Multidetector CT imaging through the chest, abdomen and pelvis was performed using the standard protocol during bolus administration of intravenous contrast. Multiplanar reconstructed images and MIPs were obtained and reviewed to evaluate the vascular anatomy.  CONTRAST:  1104m OMNIPAQUE IOHEXOL 350 MG/ML SOLN  COMPARISON:  CT of the abdomen and pelvis 02/21/2013.  FINDINGS: CTA CHEST FINDINGS  Mediastinum: No acute abnormality of the thoracic aorta; specifically, no aneurysm or dissection. Additionally on precontrast images there is no crescentic high attenuation associated with the wall of the thoracic aorta to suggest acute intramural hemorrhage. Heart  size is normal. There is no significant pericardial fluid, thickening or pericardial calcification. No pathologically enlarged mediastinal or hilar lymph nodes. Esophagus is unremarkable in appearance.  Lungs/Pleura: Assessment of the lung parenchyma is limited by considerable patient motion. With these limitations in mind there are no definite large suspicious appearing pulmonary nodules or masses identified. No acute consolidative airspace disease. No pleural effusions. Although the examination was not specifically tailored to evaluate for pulmonary embolism, opacification of the pulmonary arteries is sufficient to is exclude the presence of clinically relevant central, lobar or segmental sized pulmonary emboli. Minimal subsegmental atelectasis in the lower lobes of the lungs bilaterally.  Musculoskeletal: Innumerable lucent lesions are seen scattered throughout the visualized axial and appendicular skeleton.  Review of the MIP images confirms the above findings.  CTA ABDOMEN AND PELVIS FINDINGS  Abdomen/Pelvis: No acute abnormality of the abdominal aorta or major abdominal and pelvic vasculature. Specifically, no evidence of acute intracranial hemorrhage, dissection or aneurysm. Celiac axis, superior mesenteric artery and inferior mesenteric artery is well as their major branches are all widely patent. Single renal arteries are noted bilaterally.  The appearance of  the liver, gallbladder, pancreas, spleen, bilateral adrenal glands and bilateral kidneys is unremarkable. No significant volume of ascites. No pneumoperitoneum. No pathologic distention of small bowel. No definite pathologic will discretion no definite lymphadenopathy identified within the abdomen or pelvis.  Musculoskeletal: Innumerable small lytic lesions are again noted throughout the visualized axial and appendicular skeleton. Some of these appear similar to the prior study, while others are new and some have clearly enlarged. The best example of  an enlarging lesion is in the posteromedial aspect of the left femoral neck on image 308 of series 6 where there is a lesion that now demonstrates significant endosteal scalloping bordering on frank cortical destruction. No definite associated pathologic fractures are identified at this time.  Review of the MIP images confirms the above findings.  IMPRESSION: 1. No acute abnormality of the thoracic abdominal aorta. 2. Widespread lytic lesions throughout the visualized axial and appendicular skeleton, clearly progressed compared to the prior examination, highly concerning for systemic disease such as multiple myeloma. Alternatively, this could represent widespread metastatic disease from an unknown primary. Hematology referral for work up for potential multiple myeloma is strongly advised at this time. Should that workup reveal no evidence of multiple myeloma, further evaluation with PET-CT may be warranted to assess for potential occult primary malignancy. 3. No definite acute findings in the chest, abdomen or pelvis to account for the patient's symptoms.   Electronically Signed   By: Vinnie Langton M.D.   On: 01/28/2014 13:27    EKG: Independently reviewed. Sinus rhtythm LVH LAE, TWI V5 and V6.  Assessment/Plan  Abdominal pain: - Unclear etiology of his abdominal pain, his lipase and LFTs were within normal limits. I will start empiric PPI. - Check a UDS, beta-2 globulin, SPEP, UA TSH. - CPR, Check U sodium and Cr. - Skeletal survey, EKG in the morning cycle cardiac enzymes. - Hold pressure medication   Code Status: full Family Communication: wife Disposition Plan: observation  Time spent: 4 minutes  Charlynne Cousins [facility] Pager [phone]

## 2014-01-28 NOTE — ED Notes (Signed)
Pt arrived by [facility] with c/o upper abd pain. Pt had a BM when pain started that was normal. VS WNL Pt diaphoretic in severe pain.

## 2014-01-28 NOTE — ED Provider Notes (Signed)
Medical screening examination/treatment/procedure(s) were conducted as a shared visit with non-physician practitioner(s) and myself.  I personally evaluated the patient during the encounter.   EKG Interpretation   Date/Time:  Sunday January 28 2014 14:13:27 EDT Ventricular Rate:  80 PR Interval:  147 QRS Duration: 93 QT Interval:  361 QTC Calculation: 416 R Axis:   -20 Text Interpretation:  Sinus rhythm Consider right atrial enlargement  Borderline left axis deviation Probable anteroseptal infarct, recent No  reciprocal changes J point elevation, likely BPH No prior for comparison  Confirmed by Mingo Amber  MD, Ellora Varnum ([id number]) on 01/28/2014 3:48:32 PM       Patient here with acute onset abdominal pain while defecating. Concern for possible dissection, unable to obtain bedside US due to bowel gas. I reviewed CT scan independently prior to read by radiology, no gross dissection seen on my eval. Patient with elevated lactate. Scan normal. Other labs ok. With elevated lactate, will admit.  Osvaldo Shipper, MD 01/28/14 [phone]

## 2014-01-28 NOTE — ED Notes (Signed)
Results of lactic acid shown to primary nurse Doroteo Bradford

## 2014-01-28 NOTE — ED Provider Notes (Signed)
CSN: [id number]     Arrival date & time 01/28/14  1214 History   First MD Initiated Contact with Patient 01/28/14 1214     Chief Complaint  Patient presents with  . Abdominal Pain     (Consider location/radiation/quality/duration/timing/severity/associated sxs/prior Treatment) HPI Comments: Patient is a 53 year old male with a past medical history of hypertension and hyperlipidemia who presents to the emergency department via EMS complaining of sudden onset, severe, midepigastric abdominal pain, radiating throughout his abdomen rated 10/10 beginning 45 minutes PTA while having a bowel movement. States it was a normal BM. Prior to pain onset he was asymptomatic and feeling fine. Pt noted to be diaphoretic with EMS. Denies n/v/d, fever, chills, chest pain or shortness of breath.  Patient is a 53 y.o. male presenting with abdominal pain. The history is provided by the patient and the EMS personnel.  Abdominal Pain   Past Medical History  Diagnosis Date  . Fasting hyperglycemia     NORMAL A1c  . Hyperlipidemia   . Testosterone deficiency     Dr Hartley Barefoot  . Hypertension    Past Surgical History  Procedure Laterality Date  . Lipomas removal      BENIGN FROM CHEST AND BACK  . Cervical plate insertion      POST COMPRESSION DISC INJURY   Family History  Problem Relation Age of Onset  . Breast cancer Mother   . Heart attack Father 31  . Kidney disease Brother     RENAL FAILURE  . Hypertension Brother   . Heart attack Maternal Aunt 65    CABG  . Stroke Maternal Uncle     CVA  . Diabetes Neg Hx    History  Substance Use Topics  . Smoking status: Never Smoker   . Smokeless tobacco: Not on file  . Alcohol Use: Yes     Comment:  rarely    Review of Systems  Constitutional: Positive for diaphoresis.  Gastrointestinal: Positive for abdominal pain.  All other systems reviewed and are negative.      Allergies  Review of patient's allergies indicates no known  allergies.  Home Medications   No current outpatient prescriptions on file. BP 127/76  Pulse 74  Resp 18  SpO2 96% Physical Exam  Nursing note and vitals reviewed. Constitutional: He is oriented to person, place, and time. He appears well-developed and well-nourished.  Non-toxic appearance. He appears distressed.  HENT:  Head: Normocephalic and atraumatic.  Mouth/Throat: Oropharynx is clear and moist.  Eyes: Conjunctivae are normal.  Neck: Normal range of motion. Neck supple.  Cardiovascular: Normal rate, regular rhythm, normal heart sounds and intact distal pulses.   Pulses:      Radial pulses are 2+ on the right side, and 2+ on the left side.       Dorsalis pedis pulses are 2+ on the right side, and 2+ on the left side.       Posterior tibial pulses are 2+ on the right side, and 2+ on the left side.  No extremity edema.  Pulmonary/Chest: Effort normal and breath sounds normal. No respiratory distress.  Abdominal: Soft. Bowel sounds are normal. He exhibits pulsatile midline mass. He exhibits no distension. There is generalized tenderness. There is rebound and guarding. There is no rigidity.  Generalized abdominal tenderness, worse mid-epigastric.  Musculoskeletal: Normal range of motion. He exhibits no edema.  Neurological: He is alert and oriented to person, place, and time.  Skin: Skin is warm and dry. He is not diaphoretic.  Psychiatric: He has a normal mood and affect. His behavior is normal.    ED Course  Procedures (including critical care time) Labs Review Labs Reviewed  CBC - Abnormal; Notable for the following:    Hemoglobin 17.4 (*)    MCHC 36.3 (*)    All other components within normal limits  COMPREHENSIVE METABOLIC PANEL - Abnormal; Notable for the following:    Glucose, Bld 112 (*)    Creatinine, Ser 1.39 (*)    GFR calc non Af Amer 57 (*)    GFR calc Af Amer 66 (*)    All other components within normal limits  I-STAT CG4 LACTIC ACID, ED - Abnormal;  Notable for the following:    Lactic Acid, Venous 3.80 (*)    All other components within normal limits  LIPASE, BLOOD  URINE RAPID DRUG SCREEN (HOSP PERFORMED)  I-STAT TROPOININ, ED  TYPE AND SCREEN  ABO/RH   Imaging Review Ct Angio Chest Aortic Dissect W &/or W/o  01/28/2014   CLINICAL DATA:  Sudden onset of tearing abdominal pain. Possible aortic dissection.  EXAM: CT ANGIOGRAPHY CHEST, ABDOMEN AND PELVIS  TECHNIQUE: Multidetector CT imaging through the chest, abdomen and pelvis was performed using the standard protocol during bolus administration of intravenous contrast. Multiplanar reconstructed images and MIPs were obtained and reviewed to evaluate the vascular anatomy.  CONTRAST:  138m OMNIPAQUE IOHEXOL 350 MG/ML SOLN  COMPARISON:  CT of the abdomen and pelvis 02/21/2013.  FINDINGS: CTA CHEST FINDINGS  Mediastinum: No acute abnormality of the thoracic aorta; specifically, no aneurysm or dissection. Additionally on precontrast images there is no crescentic high attenuation associated with the wall of the thoracic aorta to suggest acute intramural hemorrhage. Heart size is normal. There is no significant pericardial fluid, thickening or pericardial calcification. No pathologically enlarged mediastinal or hilar lymph nodes. Esophagus is unremarkable in appearance.  Lungs/Pleura: Assessment of the lung parenchyma is limited by considerable patient motion. With these limitations in mind there are no definite large suspicious appearing pulmonary nodules or masses identified. No acute consolidative airspace disease. No pleural effusions. Although the examination was not specifically tailored to evaluate for pulmonary embolism, opacification of the pulmonary arteries is sufficient to is exclude the presence of clinically relevant central, lobar or segmental sized pulmonary emboli. Minimal subsegmental atelectasis in the lower lobes of the lungs bilaterally.  Musculoskeletal: Innumerable lucent lesions are  seen scattered throughout the visualized axial and appendicular skeleton.  Review of the MIP images confirms the above findings.  CTA ABDOMEN AND PELVIS FINDINGS  Abdomen/Pelvis: No acute abnormality of the abdominal aorta or major abdominal and pelvic vasculature. Specifically, no evidence of acute intracranial hemorrhage, dissection or aneurysm. Celiac axis, superior mesenteric artery and inferior mesenteric artery is well as their major branches are all widely patent. Single renal arteries are noted bilaterally.  The appearance of the liver, gallbladder, pancreas, spleen, bilateral adrenal glands and bilateral kidneys is unremarkable. No significant volume of ascites. No pneumoperitoneum. No pathologic distention of small bowel. No definite pathologic will discretion no definite lymphadenopathy identified within the abdomen or pelvis.  Musculoskeletal: Innumerable small lytic lesions are again noted throughout the visualized axial and appendicular skeleton. Some of these appear similar to the prior study, while others are new and some have clearly enlarged. The best example of an enlarging lesion is in the posteromedial aspect of the left femoral neck on image 308 of series 6 where there is a lesion that now demonstrates significant endosteal scalloping bordering on frank cortical  destruction. No definite associated pathologic fractures are identified at this time.  Review of the MIP images confirms the above findings.  IMPRESSION: 1. No acute abnormality of the thoracic abdominal aorta. 2. Widespread lytic lesions throughout the visualized axial and appendicular skeleton, clearly progressed compared to the prior examination, highly concerning for systemic disease such as multiple myeloma. Alternatively, this could represent widespread metastatic disease from an unknown primary. Hematology referral for work up for potential multiple myeloma is strongly advised at this time. Should that workup reveal no evidence  of multiple myeloma, further evaluation with PET-CT may be warranted to assess for potential occult primary malignancy. 3. No definite acute findings in the chest, abdomen or pelvis to account for the patient's symptoms.   Electronically Signed   By: Vinnie Langton M.D.   On: 01/28/2014 13:27   Ct Angio Abd/pel W/ And/or W/o  01/28/2014   CLINICAL DATA:  Sudden onset of tearing abdominal pain. Possible aortic dissection.  EXAM: CT ANGIOGRAPHY CHEST, ABDOMEN AND PELVIS  TECHNIQUE: Multidetector CT imaging through the chest, abdomen and pelvis was performed using the standard protocol during bolus administration of intravenous contrast. Multiplanar reconstructed images and MIPs were obtained and reviewed to evaluate the vascular anatomy.  CONTRAST:  142m OMNIPAQUE IOHEXOL 350 MG/ML SOLN  COMPARISON:  CT of the abdomen and pelvis 02/21/2013.  FINDINGS: CTA CHEST FINDINGS  Mediastinum: No acute abnormality of the thoracic aorta; specifically, no aneurysm or dissection. Additionally on precontrast images there is no crescentic high attenuation associated with the wall of the thoracic aorta to suggest acute intramural hemorrhage. Heart size is normal. There is no significant pericardial fluid, thickening or pericardial calcification. No pathologically enlarged mediastinal or hilar lymph nodes. Esophagus is unremarkable in appearance.  Lungs/Pleura: Assessment of the lung parenchyma is limited by considerable patient motion. With these limitations in mind there are no definite large suspicious appearing pulmonary nodules or masses identified. No acute consolidative airspace disease. No pleural effusions. Although the examination was not specifically tailored to evaluate for pulmonary embolism, opacification of the pulmonary arteries is sufficient to is exclude the presence of clinically relevant central, lobar or segmental sized pulmonary emboli. Minimal subsegmental atelectasis in the lower lobes of the lungs  bilaterally.  Musculoskeletal: Innumerable lucent lesions are seen scattered throughout the visualized axial and appendicular skeleton.  Review of the MIP images confirms the above findings.  CTA ABDOMEN AND PELVIS FINDINGS  Abdomen/Pelvis: No acute abnormality of the abdominal aorta or major abdominal and pelvic vasculature. Specifically, no evidence of acute intracranial hemorrhage, dissection or aneurysm. Celiac axis, superior mesenteric artery and inferior mesenteric artery is well as their major branches are all widely patent. Single renal arteries are noted bilaterally.  The appearance of the liver, gallbladder, pancreas, spleen, bilateral adrenal glands and bilateral kidneys is unremarkable. No significant volume of ascites. No pneumoperitoneum. No pathologic distention of small bowel. No definite pathologic will discretion no definite lymphadenopathy identified within the abdomen or pelvis.  Musculoskeletal: Innumerable small lytic lesions are again noted throughout the visualized axial and appendicular skeleton. Some of these appear similar to the prior study, while others are new and some have clearly enlarged. The best example of an enlarging lesion is in the posteromedial aspect of the left femoral neck on image 308 of series 6 where there is a lesion that now demonstrates significant endosteal scalloping bordering on frank cortical destruction. No definite associated pathologic fractures are identified at this time.  Review of the MIP images confirms the above  findings.  IMPRESSION: 1. No acute abnormality of the thoracic abdominal aorta. 2. Widespread lytic lesions throughout the visualized axial and appendicular skeleton, clearly progressed compared to the prior examination, highly concerning for systemic disease such as multiple myeloma. Alternatively, this could represent widespread metastatic disease from an unknown primary. Hematology referral for work up for potential multiple myeloma is strongly  advised at this time. Should that workup reveal no evidence of multiple myeloma, further evaluation with PET-CT may be warranted to assess for potential occult primary malignancy. 3. No definite acute findings in the chest, abdomen or pelvis to account for the patient's symptoms.   Electronically Signed   By: Vinnie Langton M.D.   On: 01/28/2014 13:27     EKG Interpretation None      MDM   Final diagnoses:  Abdominal pain  Lytic bone lesions on xray   Pt presenting with severe, sudden onset abdominal pain. He appears in distress, writhing around in pain. Abdomen tender throughout, worse mid abdomen with guarding and rebound. Initial concern for possible aortic dissection. Midline pulsatile mass noted. Bedside ultrasound performed by Dr. Mingo Amber, however no significant aortic findings. Blood pressure left arm 180/88, right arm 132/90. No chest pain. Patient immediately sent to CT, no acute abnormality of the thoracic abdominal aorta or explanation for abdominal pain seen. Widespread lytic lesions throughout the visualized axial and appendicular skeleton, progressed compared to prior examination, concerning for systemic disease such as multiple myeloma. I discussed these findings with the patient. He was aware that he had these lytic lesions, however did not followup with MRI as advised because of insurance reasons. On repeat exam, patient states his pain is only slightly improved, abdomen is still very tender to light palpation. Lab results with lactate of 3.80, creatinine of 1.39, similar to prior result of 1.3. EKG not concerning. Troponin negative. Patient will be admitted for observation, admission accepted by Dr. Aileen Fass, [facility].  Case discussed with attending Dr. Mingo Amber who also evaluated patient and agrees with plan of care.     Illene Labrador, PA-C 01/28/14 1547

## 2014-01-29 ENCOUNTER — Inpatient Hospital Stay (HOSPITAL_COMMUNITY): Payer: BC Managed Care – PPO

## 2014-01-29 DIAGNOSIS — R9431 Abnormal electrocardiogram [ECG] [EKG]: Secondary | ICD-10-CM

## 2014-01-29 DIAGNOSIS — R7309 Other abnormal glucose: Secondary | ICD-10-CM

## 2014-01-29 DIAGNOSIS — E785 Hyperlipidemia, unspecified: Secondary | ICD-10-CM

## 2014-01-29 DIAGNOSIS — R937 Abnormal findings on diagnostic imaging of other parts of musculoskeletal system: Secondary | ICD-10-CM

## 2014-01-29 LAB — COMPREHENSIVE METABOLIC PANEL
ALT: 12 U/L (ref 0–53)
AST: 16 U/L (ref 0–37)
Albumin: 3.3 g/dL — ABNORMAL LOW (ref 3.5–5.2)
Alkaline Phosphatase: 44 U/L (ref 39–117)
BUN: 9 mg/dL (ref 6–23)
CO2: 27 mEq/L (ref 19–32)
Calcium: 8.2 mg/dL — ABNORMAL LOW (ref 8.4–10.5)
Chloride: 102 mEq/L (ref 96–112)
Creatinine, Ser: 1.03 mg/dL (ref 0.50–1.35)
GFR calc Af Amer: >90 mL/min (ref >90)
GFR calc non Af Amer: 82 mL/min — ABNORMAL LOW (ref >90)
Glucose, Bld: 138 mg/dL — ABNORMAL HIGH (ref 70–99)
Potassium: 4.3 mEq/L (ref 3.7–5.3)
SODIUM: 139 meq/L (ref 137–147)
TOTAL PROTEIN: 6.5 g/dL (ref 6.0–8.3)
Total Bilirubin: 0.3 mg/dL (ref 0.3–1.2)

## 2014-01-29 LAB — CBC
HEMATOCRIT: 42.4 % (ref 39.0–52.0)
HEMOGLOBIN: 15.2 g/dL (ref 13.0–17.0)
MCH: 31.7 pg (ref 26.0–34.0)
MCHC: 35.8 g/dL (ref 30.0–36.0)
MCV: 88.5 fL (ref 78.0–100.0)
Platelets: 167 10*3/uL (ref 150–400)
RBC: 4.79 MIL/uL (ref 4.22–5.81)
RDW: 13.3 % (ref 11.5–15.5)
WBC: 5 10*3/uL (ref 4.0–10.5)

## 2014-01-29 LAB — C-REACTIVE PROTEIN: CRP: <0.5 mg/dL — ABNORMAL LOW (ref <0.60)

## 2014-01-29 LAB — TROPONIN I
Troponin I: <0.3 ng/mL (ref <0.30)
Troponin I: <0.3 ng/mL (ref <0.30)

## 2014-01-29 LAB — PROTIME-INR
INR: 1.13 (ref 0.00–1.49)
Prothrombin Time: 14.3 seconds (ref 11.6–15.2)

## 2014-01-29 MED ORDER — GUAIFENESIN-DM 100-10 MG/5ML PO SYRP
5.0000 mL | ORAL_SOLUTION | Freq: Four times a day (QID) | ORAL | Status: AC | PRN
  Administered 2014-01-30 (×2): 5 mL via ORAL
  Filled 2014-01-29 (×3): qty 5

## 2014-01-29 MED ORDER — SENNOSIDES-DOCUSATE SODIUM 8.6-50 MG PO TABS
2.0000 | ORAL_TABLET | Freq: Every day | ORAL | Status: DC
  Administered 2014-01-29 – 2014-01-31 (×3): 2 via ORAL
  Filled 2014-01-29 (×3): qty 2

## 2014-01-29 MED ORDER — AMLODIPINE BESYLATE 5 MG PO TABS
5.0000 mg | ORAL_TABLET | Freq: Every day | ORAL | Status: DC
  Administered 2014-01-29 – 2014-01-31 (×3): 5 mg via ORAL
  Filled 2014-01-29 (×3): qty 1

## 2014-01-29 MED ORDER — DIPHENHYDRAMINE HCL 25 MG PO CAPS
25.0000 mg | ORAL_CAPSULE | Freq: Four times a day (QID) | ORAL | Status: DC | PRN
  Administered 2014-01-29: 25 mg via ORAL
  Filled 2014-01-29: qty 1

## 2014-01-29 MED ORDER — GADOBENATE DIMEGLUMINE 529 MG/ML IV SOLN
20.0000 mL | Freq: Once | INTRAVENOUS | Status: AC | PRN
  Administered 2014-01-29: 20 mL via INTRAVENOUS

## 2014-01-29 MED ORDER — BISACODYL 10 MG RE SUPP
10.0000 mg | Freq: Once | RECTAL | Status: DC
  Filled 2014-01-29 (×2): qty 1

## 2014-01-29 MED ORDER — ONDANSETRON HCL 4 MG/2ML IJ SOLN
4.0000 mg | Freq: Three times a day (TID) | INTRAMUSCULAR | Status: DC
Start: 2014-01-29 — End: 2014-01-31
  Administered 2014-01-29 – 2014-01-31 (×5): 4 mg via INTRAVENOUS
  Filled 2014-01-29 (×5): qty 2

## 2014-01-29 MED ORDER — PANTOPRAZOLE SODIUM 40 MG PO TBEC
40.0000 mg | DELAYED_RELEASE_TABLET | Freq: Every day | ORAL | Status: DC
  Administered 2014-01-29 – 2014-01-31 (×3): 40 mg via ORAL
  Filled 2014-01-29 (×3): qty 1

## 2014-01-29 NOTE — Progress Notes (Signed)
Addendum  Patient seen and examined, chart and data base reviewed.  I agree with the above assessment and plan.  For full details please see Mrs. Imogene Burn PA note.  Bony lytic lesions, oncology to evaluate. Likely to have bone biopsy.   Birdie Hopes, MD [facility] Pager: [phone] 01/29/2014, 3:43 PM

## 2014-01-29 NOTE — Progress Notes (Signed)
PROGRESS NOTE  Forest Redwine Ramnath JKK:[id number] DOB: 14-Jun-1961 DOA: 01/28/2014 PCP: Unice Cobble, MD  HPI/Subjective: Mr. Disano is a 53 yo African American male with a PMH of HTN and a history of lytic lesions on previous CT (02/21/2013) who presented to the ED with sudden onset of abdominal pain without radiation. He reported that nothing made his pain better or worse. He denied any fever, chills, nausea, vomiting, diarrhea, chest pain, or SOB.   Today patient complains of severe right sided abdominal pain and having to strain when trying to move his bowels which makes the pain worse. He desires an MRI to follow up on CT results.   Assessment/Plan:  Abdominal pain:  -Unclear etiology of abdominal pain; Lipase and LFTs were within normal limits.  -Patient has stool burden on abdominal films.  Will start multiple stool softeners. -Initiated empiric PPI pantoprazole -UDS positive for opiates (not on home medication list) -Urine sodium and Cr WNL -Cycled cardiac enzymes WNL -MRI to rule out referred pain is pending.   Lytic Lesions of the Spine -Concerning for myeloma, however the patient is not anemic. -CT on 3/8 showed widespread lytic lesions in axial and appendicular skeleton including spine, bony pelvis and proximal femurs -Discussed with Dr. Marin Olp who recommended Free light chains, 24 hour urine and MRI of the spine in addition to SPEP and Bet 2 globulin. -If patient has positive light chains Dr. Marin Olp will see him and the next step will likely be bone biopsy.    Hypertension -Stable -resume amlodipine.  Hyperlipidemia -Continue atorvastatin  Testosterone Deficiency -Followed by Dr. Jonell Cluck   DVT Prophylaxis:  SCD  Code Status: Full Family Communication: Patient Alert and Oriented; Wife at bedside aware of plan. Disposition Plan: Remain inpatient   Consultants:  None  Procedures:  None  Antibiotics:  None  Objective: Filed Vitals:   01/28/14 1536  01/28/14 2039 01/29/14 0537 01/29/14 1338  BP: 136/84 140/76 121/76 149/93  Pulse: 77 61 57 69  Temp: 98.3 F (36.8 C) 98.6 F (37 C) 98.3 F (36.8 C) 97.7 F (36.5 C)  TempSrc: Oral Oral Oral Oral  Resp: _0 Height: _1  (1.803 m)     Weight: 88.4 kg (194 lb 14.2 oz)     SpO2: 96%  96% 98%    Intake/Output Summary (Last 24 hours) at 01/29/14 1515 Last data filed at 01/28/14 1700  Gross per 24 hour  Intake      0 ml  Output    400 ml  Net   -400 ml   Filed Weights   01/28/14 1536  Weight: 88.4 kg (194 lb 14.2 oz)    Exam: General: Well developed, well nourished, uncomfortable and in pain  HEENT:  PERR, EOMI, MMM.  Neck: Supple, no JVD, no masses.  Cardiovascular: RRR, S1 S2 auscultated, no rubs, murmurs or gallops.   Respiratory: Clear to auscultation bilaterally with equal chest rise  Abdomen: Soft, nondistended, + bowel sounds, tender to palpation on the right side with guarding, negative rebound tenderness at mcburneys point Extremities: warm dry without cyanosis clubbing or edema.  Neuro: AAOx3, cranial nerves grossly intact.  Skin: Without rashes exudates or nodules.   Psych: Normal affect and demeanor with intact judgement and insight   Data Reviewed: Basic Metabolic Panel:  Recent Labs Lab 01/28/14 1229 01/28/14 1800 01/29/14 0240  NA 142  --  139  K 3.7  --  4.3  CL 99  --  102  CO2  25  --  27  GLUCOSE 112*  --  138*  BUN 12  --  9  CREATININE 1.39*  --  1.03  CALCIUM 9.9  --  8.2*  MG  --  2.1  --    Liver Function Tests:  Recent Labs Lab 01/28/14 1229 01/29/14 0240  AST 21 16  ALT 17 12  ALKPHOS 54 44  BILITOT 0.5 0.3  PROT 7.8 6.5  ALBUMIN 4.3 3.3*    Recent Labs Lab 01/28/14 1229  LIPASE 30   CBC:  Recent Labs Lab 01/28/14 1229 01/29/14 0240  WBC 6.6 5.0  HGB 17.4* 15.2  HCT 47.9 42.4  MCV 88.4 88.5  PLT 197 167   Cardiac Enzymes:  Recent Labs Lab 01/28/14 1800 01/29/14 0240 01/29/14 0840  TROPONINI  <0.30 <0.30 <0.30    Studies: Dg Bone Survey Met  01/28/2014   CLINICAL DATA:  Lytic lesion on today's CT scan of the chest and abdomen and pelvis  EXAM: METASTATIC BONE SURVEY  COMPARISON:  CT ANGIO CHEST AORTA W/CM & WO/CM dated 01/28/2014; CT CTA ABD/PEL W/CM AND/OR W/O CM dated 01/28/2014  FINDINGS: The calvarium is adequately mineralized with no lytic or blastic lesion demonstrated.  The cervical spine exhibits postsurgical change at the C5-6 disc with an intradiscal device present. There are degenerative osteophytes at C2-3 and C3-4 and C4-5. No lytic or blastic bony lesion is demonstrated. The thoracic and lumbar vertebral bodies are preserved in height. The intervertebral disc space heights are well maintained. No lytic nor blastic lesions are demonstrated. The known abnormalities demonstrated on today's CT scan within the thoracic and lumbar vertebral bodies are below the level of sensitivity of the plain film technique. No abnormal paravertebral soft tissue densities are demonstrated.  The iliac and pubic bones are well mineralized. There is a 5 mm diameter lucency in the inferior pubic ramus on the right. There are known sub cm lytic lesions within the medial aspects of both iliac bones as well as in the ischia bilaterally.  There is a well-circumscribed lesion at the base of the neck of the left femur. This measures a approximately 11 mm in diameter and was visible on today's CT scan. The remainder of the left femur appears normal. The right femur is normal in appearance.  The clavicles and humeri exhibit no lytic nor blastic lesions.  The chest film reveals the lungs to be adequately inflated. There is no focal infiltrate. The cardiopericardial silhouette is normal in size. No pleural effusion is demonstrated. The observed portions of the rib cage exhibit no acute abnormalities.  IMPRESSION: The patient has known lytic lesions throughout the spine as well as within the sternum and bony pelvis and  proximal femurs based on the CT scan today. Most are inapparent on this metabolic survey series. Lesions in the bony pelvis and left femur are visible. The known thoracic and lumbar spine lesions are not clearly evident. The findings are worrisome for widespread metastatic disease or for myeloma.   Electronically Signed   By: David  Martinique   On: 01/28/2014 18:03   Ct Angio Chest Aortic Dissect W &/or W/o  01/28/2014   CLINICAL DATA:  Sudden onset of tearing abdominal pain. Possible aortic dissection.  EXAM: CT ANGIOGRAPHY CHEST, ABDOMEN AND PELVIS  TECHNIQUE: Multidetector CT imaging through the chest, abdomen and pelvis was performed using the standard protocol during bolus administration of intravenous contrast. Multiplanar reconstructed images and MIPs were obtained and reviewed to evaluate the vascular anatomy.  CONTRAST:  180m OMNIPAQUE IOHEXOL 350 MG/ML SOLN  COMPARISON:  CT of the abdomen and pelvis 02/21/2013.  FINDINGS: CTA CHEST FINDINGS  Mediastinum: No acute abnormality of the thoracic aorta; specifically, no aneurysm or dissection. Additionally on precontrast images there is no crescentic high attenuation associated with the wall of the thoracic aorta to suggest acute intramural hemorrhage. Heart size is normal. There is no significant pericardial fluid, thickening or pericardial calcification. No pathologically enlarged mediastinal or hilar lymph nodes. Esophagus is unremarkable in appearance.  Lungs/Pleura: Assessment of the lung parenchyma is limited by considerable patient motion. With these limitations in mind there are no definite large suspicious appearing pulmonary nodules or masses identified. No acute consolidative airspace disease. No pleural effusions. Although the examination was not specifically tailored to evaluate for pulmonary embolism, opacification of the pulmonary arteries is sufficient to is exclude the presence of clinically relevant central, lobar or segmental sized pulmonary  emboli. Minimal subsegmental atelectasis in the lower lobes of the lungs bilaterally.  Musculoskeletal: Innumerable lucent lesions are seen scattered throughout the visualized axial and appendicular skeleton.  Review of the MIP images confirms the above findings.  CTA ABDOMEN AND PELVIS FINDINGS  Abdomen/Pelvis: No acute abnormality of the abdominal aorta or major abdominal and pelvic vasculature. Specifically, no evidence of acute intracranial hemorrhage, dissection or aneurysm. Celiac axis, superior mesenteric artery and inferior mesenteric artery is well as their major branches are all widely patent. Single renal arteries are noted bilaterally.  The appearance of the liver, gallbladder, pancreas, spleen, bilateral adrenal glands and bilateral kidneys is unremarkable. No significant volume of ascites. No pneumoperitoneum. No pathologic distention of small bowel. No definite pathologic will discretion no definite lymphadenopathy identified within the abdomen or pelvis.  Musculoskeletal: Innumerable small lytic lesions are again noted throughout the visualized axial and appendicular skeleton. Some of these appear similar to the prior study, while others are new and some have clearly enlarged. The best example of an enlarging lesion is in the posteromedial aspect of the left femoral neck on image 308 of series 6 where there is a lesion that now demonstrates significant endosteal scalloping bordering on frank cortical destruction. No definite associated pathologic fractures are identified at this time.  Review of the MIP images confirms the above findings.  IMPRESSION: 1. No acute abnormality of the thoracic abdominal aorta. 2. Widespread lytic lesions throughout the visualized axial and appendicular skeleton, clearly progressed compared to the prior examination, highly concerning for systemic disease such as multiple myeloma. Alternatively, this could represent widespread metastatic disease from an unknown primary.  Hematology referral for work up for potential multiple myeloma is strongly advised at this time. Should that workup reveal no evidence of multiple myeloma, further evaluation with PET-CT may be warranted to assess for potential occult primary malignancy. 3. No definite acute findings in the chest, abdomen or pelvis to account for the patient's symptoms.   Electronically Signed   By: DVinnie LangtonM.D.   On: 01/28/2014 13:27   Ct Angio Abd/pel W/ And/or W/o  01/28/2014   CLINICAL DATA:  Sudden onset of tearing abdominal pain. Possible aortic dissection.  EXAM: CT ANGIOGRAPHY CHEST, ABDOMEN AND PELVIS  TECHNIQUE: Multidetector CT imaging through the chest, abdomen and pelvis was performed using the standard protocol during bolus administration of intravenous contrast. Multiplanar reconstructed images and MIPs were obtained and reviewed to evaluate the vascular anatomy.  CONTRAST:  1052mOMNIPAQUE IOHEXOL 350 MG/ML SOLN  COMPARISON:  CT of the abdomen and pelvis 02/21/2013.  FINDINGS:  CTA CHEST FINDINGS  Mediastinum: No acute abnormality of the thoracic aorta; specifically, no aneurysm or dissection. Additionally on precontrast images there is no crescentic high attenuation associated with the wall of the thoracic aorta to suggest acute intramural hemorrhage. Heart size is normal. There is no significant pericardial fluid, thickening or pericardial calcification. No pathologically enlarged mediastinal or hilar lymph nodes. Esophagus is unremarkable in appearance.  Lungs/Pleura: Assessment of the lung parenchyma is limited by considerable patient motion. With these limitations in mind there are no definite large suspicious appearing pulmonary nodules or masses identified. No acute consolidative airspace disease. No pleural effusions. Although the examination was not specifically tailored to evaluate for pulmonary embolism, opacification of the pulmonary arteries is sufficient to is exclude the presence of  clinically relevant central, lobar or segmental sized pulmonary emboli. Minimal subsegmental atelectasis in the lower lobes of the lungs bilaterally.  Musculoskeletal: Innumerable lucent lesions are seen scattered throughout the visualized axial and appendicular skeleton.  Review of the MIP images confirms the above findings.  CTA ABDOMEN AND PELVIS FINDINGS  Abdomen/Pelvis: No acute abnormality of the abdominal aorta or major abdominal and pelvic vasculature. Specifically, no evidence of acute intracranial hemorrhage, dissection or aneurysm. Celiac axis, superior mesenteric artery and inferior mesenteric artery is well as their major branches are all widely patent. Single renal arteries are noted bilaterally.  The appearance of the liver, gallbladder, pancreas, spleen, bilateral adrenal glands and bilateral kidneys is unremarkable. No significant volume of ascites. No pneumoperitoneum. No pathologic distention of small bowel. No definite pathologic will discretion no definite lymphadenopathy identified within the abdomen or pelvis.  Musculoskeletal: Innumerable small lytic lesions are again noted throughout the visualized axial and appendicular skeleton. Some of these appear similar to the prior study, while others are new and some have clearly enlarged. The best example of an enlarging lesion is in the posteromedial aspect of the left femoral neck on image 308 of series 6 where there is a lesion that now demonstrates significant endosteal scalloping bordering on frank cortical destruction. No definite associated pathologic fractures are identified at this time.  Review of the MIP images confirms the above findings.  IMPRESSION: 1. No acute abnormality of the thoracic abdominal aorta. 2. Widespread lytic lesions throughout the visualized axial and appendicular skeleton, clearly progressed compared to the prior examination, highly concerning for systemic disease such as multiple myeloma. Alternatively, this could  represent widespread metastatic disease from an unknown primary. Hematology referral for work up for potential multiple myeloma is strongly advised at this time. Should that workup reveal no evidence of multiple myeloma, further evaluation with PET-CT may be warranted to assess for potential occult primary malignancy. 3. No definite acute findings in the chest, abdomen or pelvis to account for the patient's symptoms.   Electronically Signed   By: Vinnie Langton M.D.   On: 01/28/2014 13:27    Scheduled Meds: . amLODipine  5 mg Oral Daily  . atorvastatin  20 mg Oral Daily  . bisacodyl  10 mg Rectal Once  . influenza vac split quadrivalent PF  0.5 mL Intramuscular Tomorrow-1000  . pantoprazole  40 mg Oral Daily  . senna-docusate  2 tablet Oral Daily  . sodium chloride  3 mL Intravenous Q12H   Continuous Infusions: . dextrose 5 % and 0.45% NaCl 125 mL/hr (01/29/14 0741)    Active Problems:   Abdominal pain   Epigastric abdominal pain    Ruben Im PA-S  Imogene Burn, PA-C [facility] Pager [phone]. If 7PM-7AM,  please contact night-coverage at [email or web address], password [facility] 01/29/2014, 3:15 PM  LOS: 1 day

## 2014-01-29 NOTE — Progress Notes (Signed)
Pt c/o of "gas and bloat." No prns ordered and pt is NPO. RN advised pt to try repositioning himself as he had been lying in the same position for most of the night. Pt later told RN that repositioning did not provide relief. Notified Rogue Bussing, NP who asked RN to speak with pharmacy and see what their suggestions were. Spoke with Jeneen Rinks in pharmacy, no suggestions could be made. Pt states that his pain is controlled and is resting.  Will pass onto day shift, for staff to notify attending MDs.

## 2014-01-29 NOTE — Progress Notes (Signed)
24 hr urine collection has started

## 2014-01-29 NOTE — Progress Notes (Signed)
UR completed. Patient changed to inpatient- requiring IVF @ 125cc/hr and IV pain medications.

## 2014-01-30 ENCOUNTER — Inpatient Hospital Stay (HOSPITAL_COMMUNITY): Payer: BC Managed Care – PPO

## 2014-01-30 ENCOUNTER — Encounter (HOSPITAL_COMMUNITY): Payer: Self-pay | Admitting: Radiology

## 2014-01-30 DIAGNOSIS — M899 Disorder of bone, unspecified: Secondary | ICD-10-CM | POA: Diagnosis present

## 2014-01-30 DIAGNOSIS — N289 Disorder of kidney and ureter, unspecified: Secondary | ICD-10-CM | POA: Diagnosis present

## 2014-01-30 DIAGNOSIS — R1013 Epigastric pain: Secondary | ICD-10-CM

## 2014-01-30 DIAGNOSIS — R109 Unspecified abdominal pain: Secondary | ICD-10-CM

## 2014-01-30 LAB — PROTEIN ELECTROPHORESIS, SERUM
Albumin ELP: 57.4 % (ref 55.8–66.1)
Alpha-1-Globulin: 4.8 % (ref 2.9–4.9)
Alpha-2-Globulin: 10.7 % (ref 7.1–11.8)
Beta 2: 4.6 % (ref 3.2–6.5)
Beta Globulin: 4.1 % — ABNORMAL LOW (ref 4.7–7.2)
Gamma Globulin: 18.4 % (ref 11.1–18.8)
M-Spike, %: NOT DETECTED g/dL
Total Protein ELP: 6.5 g/dL (ref 6.0–8.3)

## 2014-01-30 LAB — NA AND K (SODIUM & POTASSIUM), 24 H UR
POTASSIUM UR: 17 meq/L
Potassium, 24H Ur: 44 mEq/d (ref 25–125)
Sodium, 24H Ur: 146 mEq/d (ref 40–220)
Sodium, Ur: 56 mEq/L
Urine Total Volume-UNAK24: 2600 mL

## 2014-01-30 LAB — PROTEIN, URINE, 24 HOUR
COLLECTION INTERVAL-UPROT: 24 h
PROTEIN, URINE: 9 mg/dL
Protein, 24H Urine: 234 mg/d — ABNORMAL HIGH (ref 50–100)
Urine Total Volume-UPROT: 2600 mL

## 2014-01-30 LAB — CREATININE, URINE, 24 HOUR
COLLECTION INTERVAL-UCRE24: 24 h
Creatinine, 24H Ur: 1729 mg/d (ref 800–2000)
Creatinine, Urine: 66.5 mg/dL
Urine Total Volume-UCRE24: 2600 mL

## 2014-01-30 LAB — BONE MARROW EXAM

## 2014-01-30 MED ORDER — MAGNESIUM CITRATE PO SOLN
1.0000 | Freq: Once | ORAL | Status: AC
  Administered 2014-01-30: 1 via ORAL
  Filled 2014-01-30: qty 296

## 2014-01-30 MED ORDER — FENTANYL CITRATE 0.05 MG/ML IJ SOLN
INTRAMUSCULAR | Status: AC
  Filled 2014-01-30: qty 4

## 2014-01-30 MED ORDER — FENTANYL CITRATE 0.05 MG/ML IJ SOLN
INTRAMUSCULAR | Status: AC | PRN
  Administered 2014-01-30 (×2): 50 ug via INTRAVENOUS

## 2014-01-30 MED ORDER — DICYCLOMINE HCL 10 MG PO CAPS
10.0000 mg | ORAL_CAPSULE | Freq: Three times a day (TID) | ORAL | Status: DC
  Administered 2014-01-30 – 2014-01-31 (×3): 10 mg via ORAL
  Filled 2014-01-30 (×8): qty 1

## 2014-01-30 MED ORDER — LIDOCAINE HCL 1 % IJ SOLN
INTRAMUSCULAR | Status: AC
  Filled 2014-01-30: qty 10

## 2014-01-30 MED ORDER — HYDROMORPHONE HCL PF 1 MG/ML IJ SOLN: 1.0000 mg | INTRAMUSCULAR | Status: DC | PRN

## 2014-01-30 MED ORDER — MIDAZOLAM HCL 2 MG/2ML IJ SOLN
INTRAMUSCULAR | Status: AC | PRN
  Administered 2014-01-30: 1 mg via INTRAVENOUS
  Administered 2014-01-30: 2 mg via INTRAVENOUS
  Administered 2014-01-30: 1 mg via INTRAVENOUS

## 2014-01-30 MED ORDER — MIDAZOLAM HCL 2 MG/2ML IJ SOLN
INTRAMUSCULAR | Status: AC
  Filled 2014-01-30: qty 4

## 2014-01-30 MED ORDER — SALINE SPRAY 0.65 % NA SOLN
1.0000 | NASAL | Status: DC | PRN
Start: 2014-01-30 — End: 2014-01-31
  Administered 2014-01-30: 1 via NASAL
  Filled 2014-01-30: qty 44

## 2014-01-30 MED ORDER — POLYETHYLENE GLYCOL 3350 17 G PO PACK
17.0000 g | PACK | Freq: Every day | ORAL | Status: DC
  Administered 2014-01-30 – 2014-01-31 (×2): 17 g via ORAL
  Filled 2014-01-30 (×2): qty 1

## 2014-01-30 NOTE — Procedures (Signed)
CT guided bone marrow biopsy was performed in the left iliac bone.  The core biopsy was directed through small lucent lesions.  No immediate complication.

## 2014-01-30 NOTE — Progress Notes (Signed)
Addendum  Patient seen and examined, chart and data base reviewed.  I agree with the above assessment and plan.  For full details please see Mrs. Imogene Burn PA note.  Diffuse lytic bony lesions, working diagnosis is multiple myeloma.  Status post bone marrow and bone core biopsy. Oncology to follow.   Birdie Hopes, MD [facility] Pager: [phone] 01/30/2014, 6:30 PM

## 2014-01-30 NOTE — Sedation Documentation (Signed)
Patient denies pain and is resting comfortably.  

## 2014-01-30 NOTE — Progress Notes (Signed)
PROGRESS NOTE  Jimmy Day KDT:[id number] DOB: 07/07/1961 DOA: 01/28/2014 PCP: Unice Cobble, MD  HPI/Subjective: [name] is a 53 yo African American male with a PMH of HTN and a history of lytic lesions on previous CT (02/21/2013) who presented to the ED with sudden onset of abdominal pain without radiation. He reported that nothing made his pain better or worse. He denied any fever, chills, nausea, vomiting, diarrhea, chest pain, or SOB.   Yesterday (3/9) patient complained of severe right sided abdominal pain that had him crying in pain and was difficult to control. Today he states he is feeling better, has had some bowel movements and his abdominal pain is much improved.   Assessment/Plan:  Right sided Abdominal pain:  -Unclear etiology of abdominal pain; Lipase and LFTs were within normal limits.  -CT Angio of chest/abdomin/pelvis was negative for any acute abnormality or ischemia. -Patient has had comfortable bowel movements after stool softeners. -Initiated empiric PPI pantoprazole -UDS positive for opiates (not on home medication list) -Urine sodium and Cr WNL -Cycled cardiac enzymes WNL  -GI consultation requested by wife.  Appreciate [facility] consult.  -Pain improved on 3/10. Decrease dilaudid and add Oxy 5 mg and bentyl.  Advancing diet to full liquids.  Lytic Lesions of the Spine -Concerning for myeloma, however the patient is not anemic. -CT on 3/8 showed widespread lytic lesions in axial and appendicular skeleton including spine, bony pelvis and proximal femurs -MRI findings on 3/9 showed numerous small lytic lesions in thoracic and lumbar spine -Discussed with Dr. Marin Olp who recommended Free light chains (pending), 24 hour urine (pending) and MRI of the spine (results below) in addition to SPEP and Bet 2 globulin (pending). -Patient underwent bone marrow and core bone biopsy (3/10); results pending  Hypertension -Stable -Resume  amlodipine.  Hyperlipidemia -Continue Atorvastatin  Testosterone Deficiency -Followed by Dr. Jonell Day  Acute renal insuff -pre-renal. -completely resolved.   DVT Prophylaxis:  SCD  Code Status: Full Family Communication: Patient Alert and Oriented; Wife at bedside aware of plan. Disposition Plan: Remain inpatient   Consultants:  None  Procedures:  Bone Marrow & Core Bone Biopsy  Antibiotics:  None  Objective: Filed Vitals:   01/30/14 1258 01/30/14 1320 01/30/14 1353 01/30/14 1415  BP: 151/93 135/84 116/78 127/80  Pulse: 71 54 70 71  Temp: 98 F (36.7 C) 97.9 F (36.6 C) 98.2 F (36.8 C) 97.5 F (36.4 C)  TempSrc:  Oral Oral Oral  Resp: _0 Height:      Weight:      SpO2: 96% 99% 98% 98%    Intake/Output Summary (Last 24 hours) at 01/30/14 1442 Last data filed at 01/30/14 0500  Gross per 24 hour  Intake      0 ml  Output    500 ml  Net   -500 ml   Filed Weights   01/28/14 1536  Weight: 88.4 kg (194 lb 14.2 oz)    Exam: General: Well developed, well nourished, in NAD HEENT:  PERR, EOMI, MMM.  Neck: Supple, no JVD, no masses.  Cardiovascular: RRR, S1 S2 auscultated, no rubs, murmurs or gallops.   Respiratory: Clear to auscultation bilaterally with equal chest rise  Abdomen: Soft, nondistended,  + bowel sounds, tender to moderately deep palpation on the right side. Extremities: warm dry without cyanosis clubbing or edema.  Neuro: AAOx3, cranial nerves grossly intact.  Skin: Without rashes exudates or nodules.   Psych: Normal affect and demeanor with intact judgement  and insight   Data Reviewed: Basic Metabolic Panel:  Recent Labs Lab 01/28/14 1229 01/28/14 1800 01/29/14 0240  NA 142  --  139  K 3.7  --  4.3  CL 99  --  102  CO2 25  --  27  GLUCOSE 112*  --  138*  BUN 12  --  9  CREATININE 1.39*  --  1.03  CALCIUM 9.9  --  8.2*  MG  --  2.1  --    Liver Function Tests:  Recent Labs Lab 01/28/14 1229 01/29/14 0240   AST 21 16  ALT 17 12  ALKPHOS 54 44  BILITOT 0.5 0.3  PROT 7.8 6.5  ALBUMIN 4.3 3.3*    Recent Labs Lab 01/28/14 1229  LIPASE 30   CBC:  Recent Labs Lab 01/28/14 1229 01/29/14 0240  WBC 6.6 5.0  HGB 17.4* 15.2  HCT 47.9 42.4  MCV 88.4 88.5  PLT 197 167   Cardiac Enzymes:  Recent Labs Lab 01/28/14 1800 01/29/14 0240 01/29/14 0840  TROPONINI <0.30 <0.30 <0.30    Studies: Mr Thoracic Spine W Wo Contrast  01/29/2014   CLINICAL DATA:  Abdominal pain.  Lytic spine lesions on CT.  EXAM: MRI THORACIC AND LUMBAR SPINE WITHOUT AND WITH CONTRAST  TECHNIQUE: Multiplanar and multiecho pulse sequences of the thoracic and lumbar spine were obtained without and with intravenous contrast.  CONTRAST:  8m MULTIHANCE GADOBENATE DIMEGLUMINE 529 MG/ML IV SOLN  COMPARISON:  CT abdomen and pelvis 01/28/2014  FINDINGS: MR THORACIC SPINE FINDINGS  Numerous a small punched-out lesions are present throughout the thoracic spine. These are actually better seen on CT than MRI. Some of them show mild enhancement. The largest lesion is on the left side of the T3 vertebral body. Negative for fracture. No epidural tumor or cord compression is identified.  Mild disc and facet degeneration at T9-10 causing mild spinal stenosis. There is mild foraminal narrowing bilaterally. Mild disc and facet degeneration also at T10-11 with mild spinal stenosis. Mild disc degeneration at T7-8 and T8-9.  Thoracic spinal cord signal is normal. Minimal pleural effusion bilaterally.  MR LUMBAR SPINE FINDINGS  Small lytic punched out lesions in the lumbar spine are best seen on the CT. Some of these lesions may show minimal enhancement. No fracture or bone marrow edema. Negative for epidural tumor or spinal stenosis. Conus medullaris is normal and terminates at L1-2.  L1-2:  Negative  L2-3:  Negative  L3-4:  Mild facet degeneration  L4-5: Left foraminal disc protrusion with impingement of the left L4 nerve root in the foramen. Mild  spinal stenosis. Bilateral facet hypertrophy.  L5-S1:  Mild disc and facet degeneration.  IMPRESSION: Numerous small lytic lesions in the thoracic and lumbar spine, best seen on the recent CT. Pattern is highly suspicious for multiple myeloma.  Negative for fracture.  No epidural tumor or cord compression  Left foraminal disc protrusion L4-5 with left L4 nerve root impingement.   Electronically Signed   By: CFranchot GalloM.D.   On: 01/29/2014 18:50   Mr Lumbar Spine W Wo Contrast  01/29/2014   CLINICAL DATA:  Abdominal pain.  Lytic spine lesions on CT.  EXAM: MRI THORACIC AND LUMBAR SPINE WITHOUT AND WITH CONTRAST  TECHNIQUE: Multiplanar and multiecho pulse sequences of the thoracic and lumbar spine were obtained without and with intravenous contrast.  CONTRAST:  243mMULTIHANCE GADOBENATE DIMEGLUMINE 529 MG/ML IV SOLN  COMPARISON:  CT abdomen and pelvis 01/28/2014  FINDINGS: MR THORACIC  SPINE FINDINGS  Numerous a small punched-out lesions are present throughout the thoracic spine. These are actually better seen on CT than MRI. Some of them show mild enhancement. The largest lesion is on the left side of the T3 vertebral body. Negative for fracture. No epidural tumor or cord compression is identified.  Mild disc and facet degeneration at T9-10 causing mild spinal stenosis. There is mild foraminal narrowing bilaterally. Mild disc and facet degeneration also at T10-11 with mild spinal stenosis. Mild disc degeneration at T7-8 and T8-9.  Thoracic spinal cord signal is normal. Minimal pleural effusion bilaterally.  MR LUMBAR SPINE FINDINGS  Small lytic punched out lesions in the lumbar spine are best seen on the CT. Some of these lesions may show minimal enhancement. No fracture or bone marrow edema. Negative for epidural tumor or spinal stenosis. Conus medullaris is normal and terminates at L1-2.  L1-2:  Negative  L2-3:  Negative  L3-4:  Mild facet degeneration  L4-5: Left foraminal disc protrusion with impingement  of the left L4 nerve root in the foramen. Mild spinal stenosis. Bilateral facet hypertrophy.  L5-S1:  Mild disc and facet degeneration.  IMPRESSION: Numerous small lytic lesions in the thoracic and lumbar spine, best seen on the recent CT. Pattern is highly suspicious for multiple myeloma.  Negative for fracture.  No epidural tumor or cord compression  Left foraminal disc protrusion L4-5 with left L4 nerve root impingement.   Electronically Signed   By: Franchot Gallo M.D.   On: 01/29/2014 18:50   Ct Biopsy  01/30/2014   CLINICAL DATA 53 year old with abdominal pain and multiple lucent lesions throughout the skeleton.  EXAM CT GUIDED BONE MARROW ASPIRATES AND BIOPSY  Physician: Stephan Minister. Anselm Pancoast, MD  MEDICATIONS 4 mg versed, 100 mcg fentanyl. A radiology nurse monitored the patient for moderate sedation.  ANESTHESIA/SEDATION Sedation time: 20  PROCEDURE The procedure was explained to the patient. The risks and benefits of the procedure were discussed and the patient's questions were addressed. Informed consent was obtained from the patient. The patient was placed prone on CT scan. Images of the pelvis were obtained. The left side of back was prepped and draped in sterile fashion. The skin and left posterior iliac bone were anesthetized with 1% lidocaine. 11 gauge bone needle was directed into the left iliac bone with CT guidance. Two aspirates and 2 core biopsies performed. One suitable core biopsy was obtained.  FINDINGS The bone needle was placed in the posterior left iliac bone. The bone needle was directed towards multiple small lucent lesions. Trace amount of fluid in the right lower quadrant of the abdomen.  COMPLICATIONS None  IMPRESSION CT guided bone marrow aspirates and core biopsy. The core biopsy was directed through small lucent lesions.  Trace amount of fluid in the abdomen.  SIGNATURE  Electronically Signed   By: Markus Daft M.D.   On: 01/30/2014 13:22   Dg Bone Survey Met  01/28/2014   CLINICAL DATA:   Lytic lesion on today's CT scan of the chest and abdomen and pelvis  EXAM: METASTATIC BONE SURVEY  COMPARISON:  CT ANGIO CHEST AORTA W/CM & WO/CM dated 01/28/2014; CT CTA ABD/PEL W/CM AND/OR W/O CM dated 01/28/2014  FINDINGS: The calvarium is adequately mineralized with no lytic or blastic lesion demonstrated.  The cervical spine exhibits postsurgical change at the C5-6 disc with an intradiscal device present. There are degenerative osteophytes at C2-3 and C3-4 and C4-5. No lytic or blastic bony lesion is demonstrated. The thoracic  and lumbar vertebral bodies are preserved in height. The intervertebral disc space heights are well maintained. No lytic nor blastic lesions are demonstrated. The known abnormalities demonstrated on today's CT scan within the thoracic and lumbar vertebral bodies are below the level of sensitivity of the plain film technique. No abnormal paravertebral soft tissue densities are demonstrated.  The iliac and pubic bones are well mineralized. There is a 5 mm diameter lucency in the inferior pubic ramus on the right. There are known sub cm lytic lesions within the medial aspects of both iliac bones as well as in the ischia bilaterally.  There is a well-circumscribed lesion at the base of the neck of the left femur. This measures a approximately 11 mm in diameter and was visible on today's CT scan. The remainder of the left femur appears normal. The right femur is normal in appearance.  The clavicles and humeri exhibit no lytic nor blastic lesions.  The chest film reveals the lungs to be adequately inflated. There is no focal infiltrate. The cardiopericardial silhouette is normal in size. No pleural effusion is demonstrated. The observed portions of the rib cage exhibit no acute abnormalities.  IMPRESSION: The patient has known lytic lesions throughout the spine as well as within the sternum and bony pelvis and proximal femurs based on the CT scan today. Most are inapparent on this metabolic survey  series. Lesions in the bony pelvis and left femur are visible. The known thoracic and lumbar spine lesions are not clearly evident. The findings are worrisome for widespread metastatic disease or for myeloma.   Electronically Signed   By: David  Martinique   On: 01/28/2014 18:03    Scheduled Meds: . amLODipine  5 mg Oral Daily  . atorvastatin  20 mg Oral Daily  . bisacodyl  10 mg Rectal Once  . dicyclomine  10 mg Oral TID AC & HS  . fentaNYL      . influenza vac split quadrivalent PF  0.5 mL Intramuscular Tomorrow-1000  . lidocaine      . midazolam      . ondansetron (ZOFRAN) IV  4 mg Intravenous TID AC  . pantoprazole  40 mg Oral Daily  . polyethylene glycol  17 g Oral Daily  . senna-docusate  2 tablet Oral Daily  . sodium chloride  3 mL Intravenous Q12H   Continuous Infusions:  Principal Problem:   Abdominal pain Active Problems:   Lytic bone lesions on xray   Acute renal insufficiency   Ruben Im PA-S  [facility] Pager [phone]. If 7PM-7AM, please contact night-coverage at [email or web address], password [facility] 01/30/2014, 2:42 PM  LOS: 2 days

## 2014-01-30 NOTE — Consult Note (Signed)
Consultation  Referring Provider: [facility] (Dr. Olevia Bowens)     Primary Care Physician:  Unice Cobble, MD Primary Gastroenterologist:  none       Reason for Consultation:   Abdominal pain         HPI:   Jimmy Day is a 53 y.o. male with PMH significant for HTN and dyslipidemia admitted yesterday with acute, severe abdominal pain. Troponins negative, WBC normal. LFTs normal. CTA of chest/abdomen and pelvis reveals multiple lytic lesions suspicious for multiple myeloma. Thoracic and lumbar spine MRI suggest the same. No GI findings on imaging studies. Patient developed acute right mid abdominal pain ( a few inches right of umbilicus) on Sunday after having a BM. Pain was severe, unrelenting. Patient was unable to achieve any degree of comfort in any position. He had similar pain in April, came to ED and CTscan showed several lytic bone lesions in spine. Follow up studies ordered at PCP follow up but did not get done for unclear reasons. Patient didn't have any more abdominal pain until this past Sunday. No blood in stools. His BMs mainly consist of small pieces of stool twice a day. He usually strains during defecation. His weight is stable. No other GI complaints.    Past Medical History  Diagnosis Date  . Fasting hyperglycemia     NORMAL A1c  . Hyperlipidemia   . Testosterone deficiency     Dr Hartley Barefoot  . Hypertension     Past Surgical History  Procedure Laterality Date  . Lipomas removal      BENIGN FROM CHEST AND BACK  . Cervical plate insertion      POST COMPRESSION DISC INJURY    Family History  Problem Relation Age of Onset  . Breast cancer Mother   . Heart attack Father 70  . Kidney disease Brother     RENAL FAILURE  . Hypertension Brother   . Heart attack Maternal Aunt 65    CABG  . Stroke Maternal Uncle     CVA  . Diabetes Neg Hx    No FMH of colon cancer, liver disease or pancreatic disease  History  Substance Use Topics  . Smoking status: Never  Smoker   . Smokeless tobacco: Not on file  . Alcohol Use: Yes     Comment:  rarely    Prior to Admission medications   Medication Sig Start Date End Date Taking? Authorizing Provider  amLODipine (NORVASC) 5 MG tablet Take 5 mg by mouth daily.   Yes Historical Provider, MD  atorvastatin (LIPITOR) 20 MG tablet Take 1 tablet (20 mg total) by mouth daily. 08/07/13  Yes Hendricks Limes, MD  Pseudoeph-CPM-DM-APAP (TYLENOL COLD PO) Take 2 tablets by mouth daily as needed (cold / congestion).   Yes Historical Provider, MD    Current Facility-Administered Medications  Medication Dose Route Frequency Provider Last Rate Last Dose  . amLODipine (NORVASC) tablet 5 mg  5 mg Oral Daily Melton Alar, PA-C   5 mg at 01/29/14 1614  . atorvastatin (LIPITOR) tablet 20 mg  20 mg Oral Daily Charlynne Cousins, MD   20 mg at 01/29/14 1114  . bisacodyl (DULCOLAX) suppository 10 mg  10 mg Rectal Once Melton Alar, PA-C      . diphenhydrAMINE (BENADRYL) capsule 25 mg  25 mg Oral Q6H PRN Melton Alar, PA-C   25 mg at 01/29/14 1154  . fentaNYL (SUBLIMAZE) 0.05 MG/ML injection           .  guaiFENesin-dextromethorphan (ROBITUSSIN DM) 100-10 MG/5ML syrup 5 mL  5 mL Oral Q6H PRN Gardiner Barefoot, NP   5 mL at 01/30/14 0021  . HYDROmorphone (DILAUDID) injection 2 mg  2 mg Intravenous Q3H PRN Melton Alar, PA-C   2 mg at 01/29/14 1614  . influenza vac split quadrivalent PF (FLUARIX) injection 0.5 mL  0.5 mL Intramuscular Tomorrow-1000 Charlynne Cousins, MD      . lidocaine (XYLOCAINE) 1 % (with pres) injection           . midazolam (VERSED) 2 MG/2ML injection           . ondansetron (ZOFRAN) injection 4 mg  4 mg Intravenous TID AC Marianne L York, PA-C   4 mg at 01/30/14 0743  . pantoprazole (PROTONIX) EC tablet 40 mg  40 mg Oral Daily Melton Alar, PA-C   40 mg at 01/29/14 1154  . polyethylene glycol (MIRALAX / GLYCOLAX) packet 17 g  17 g Oral Daily PRN Charlynne Cousins, MD   17 g at 01/29/14 1154   . senna-docusate (Senokot-S) tablet 2 tablet  2 tablet Oral Daily Melton Alar, PA-C   2 tablet at 01/29/14 1614  . sodium chloride (OCEAN) 0.65 % nasal spray 1 spray  1 spray Each Nare PRN Gardiner Barefoot, NP   1 spray at 01/30/14 0550  . sodium chloride 0.9 % injection 3 mL  3 mL Intravenous Q12H Charlynne Cousins, MD   3 mL at 01/29/14 1115    Allergies as of 01/28/2014  . (No Known Allergies)    Review of Systems:    All systems reviewed and negative except where noted in HPI.   Physical Exam:  Vital signs in last 24 hours: Temp:  [97.7 F (36.5 C)-98.3 F (36.8 C)] 98 F (36.7 C) (03/10 0527) Pulse Rate:  [66-78] 78 (03/10 1155) Resp:  [9-20] 10 (03/10 1155) BP: (122-152)/(74-100) 128/80 mmHg (03/10 1150) SpO2:  [96 %-100 %] 100 % (03/10 1155) Last BM Date: 01/29/14 General:   Pleasant black male in NAD Head:  Normocephalic and atraumatic. Eyes:   No icterus.   Conjunctiva pink. Ears:  Normal auditory acuity. Neck:  Supple; no masses felt Lungs:  Respirations even and unlabored. Lungs clear to auscultation bilaterally.   No wheezes, crackles, or rhonchi.  Heart:  Regular rate and rhythm Abdomen:  Soft, nondistended, mild tenderness to palpation localized to area approximately 2 inches right of umbilicus. nontender. Negative Carnett's sign. Normal bowel sounds. No appreciable masses or hepatomegaly.  Rectal:  Not performed.  Msk:  Symmetrical without gross deformities.  Extremities:  Without edema. Neurologic:  Alert and  oriented x4;  grossly normal neurologically. Skin:  Intact without significant lesions or rashes. Cervical Nodes:  No significant cervical adenopathy. Psych:  Alert and cooperative. Normal affect.  LAB RESULTS:  Recent Labs  01/28/14 1229 01/29/14 0240  WBC 6.6 5.0  HGB 17.4* 15.2  HCT 47.9 42.4  PLT 197 167   BMET  Recent Labs  01/28/14 1229 01/29/14 0240  NA 142 139  K 3.7 4.3  CL 99 102  CO2 25 27  GLUCOSE 112* 138*    BUN 12 9  CREATININE 1.39* 1.03  CALCIUM 9.9 8.2*   LFT  Recent Labs  01/29/14 0240  PROT 6.5  ALBUMIN 3.3*  AST 16  ALT 12  ALKPHOS 44  BILITOT 0.3   PT/INR  Recent Labs  01/29/14 0240  LABPROT 14.3  INR 1.13  STUDIES: Mr Thoracic Spine W Wo Contrast  01/29/2014   CLINICAL DATA:  Abdominal pain.  Lytic spine lesions on CT.  EXAM: MRI THORACIC AND LUMBAR SPINE WITHOUT AND WITH CONTRAST  TECHNIQUE: Multiplanar and multiecho pulse sequences of the thoracic and lumbar spine were obtained without and with intravenous contrast.  CONTRAST:  42m MULTIHANCE GADOBENATE DIMEGLUMINE 529 MG/ML IV SOLN  COMPARISON:  CT abdomen and pelvis 01/28/2014  FINDINGS: MR THORACIC SPINE FINDINGS  Numerous a small punched-out lesions are present throughout the thoracic spine. These are actually better seen on CT than MRI. Some of them show mild enhancement. The largest lesion is on the left side of the T3 vertebral body. Negative for fracture. No epidural tumor or cord compression is identified.  Mild disc and facet degeneration at T9-10 causing mild spinal stenosis. There is mild foraminal narrowing bilaterally. Mild disc and facet degeneration also at T10-11 with mild spinal stenosis. Mild disc degeneration at T7-8 and T8-9.  Thoracic spinal cord signal is normal. Minimal pleural effusion bilaterally.  MR LUMBAR SPINE FINDINGS  Small lytic punched out lesions in the lumbar spine are best seen on the CT. Some of these lesions may show minimal enhancement. No fracture or bone marrow edema. Negative for epidural tumor or spinal stenosis. Conus medullaris is normal and terminates at L1-2.  L1-2:  Negative  L2-3:  Negative  L3-4:  Mild facet degeneration  L4-5: Left foraminal disc protrusion with impingement of the left L4 nerve root in the foramen. Mild spinal stenosis. Bilateral facet hypertrophy.  L5-S1:  Mild disc and facet degeneration.  IMPRESSION: Numerous small lytic lesions in the thoracic and lumbar  spine, best seen on the recent CT. Pattern is highly suspicious for multiple myeloma.  Negative for fracture.  No epidural tumor or cord compression  Left foraminal disc protrusion L4-5 with left L4 nerve root impingement.   Electronically Signed   By: CFranchot GalloM.D.   On: 01/29/2014 18:50   Mr Lumbar Spine W Wo Contrast  01/29/2014   CLINICAL DATA:  Abdominal pain.  Lytic spine lesions on CT.  EXAM: MRI THORACIC AND LUMBAR SPINE WITHOUT AND WITH CONTRAST  TECHNIQUE: Multiplanar and multiecho pulse sequences of the thoracic and lumbar spine were obtained without and with intravenous contrast.  CONTRAST:  277mMULTIHANCE GADOBENATE DIMEGLUMINE 529 MG/ML IV SOLN  COMPARISON:  CT abdomen and pelvis 01/28/2014  FINDINGS: MR THORACIC SPINE FINDINGS  Numerous a small punched-out lesions are present throughout the thoracic spine. These are actually better seen on CT than MRI. Some of them show mild enhancement. The largest lesion is on the left side of the T3 vertebral body. Negative for fracture. No epidural tumor or cord compression is identified.  Mild disc and facet degeneration at T9-10 causing mild spinal stenosis. There is mild foraminal narrowing bilaterally. Mild disc and facet degeneration also at T10-11 with mild spinal stenosis. Mild disc degeneration at T7-8 and T8-9.  Thoracic spinal cord signal is normal. Minimal pleural effusion bilaterally.  MR LUMBAR SPINE FINDINGS  Small lytic punched out lesions in the lumbar spine are best seen on the CT. Some of these lesions may show minimal enhancement. No fracture or bone marrow edema. Negative for epidural tumor or spinal stenosis. Conus medullaris is normal and terminates at L1-2.  L1-2:  Negative  L2-3:  Negative  L3-4:  Mild facet degeneration  L4-5: Left foraminal disc protrusion with impingement of the left L4 nerve root in the foramen. Mild spinal stenosis. Bilateral  facet hypertrophy.  L5-S1:  Mild disc and facet degeneration.  IMPRESSION: Numerous  small lytic lesions in the thoracic and lumbar spine, best seen on the recent CT. Pattern is highly suspicious for multiple myeloma.  Negative for fracture.  No epidural tumor or cord compression  Left foraminal disc protrusion L4-5 with left L4 nerve root impingement.   Electronically Signed   By: Franchot Gallo M.D.   On: 01/29/2014 18:50   Dg Bone Survey Met  01/28/2014   CLINICAL DATA:  Lytic lesion on today's CT scan of the chest and abdomen and pelvis  EXAM: METASTATIC BONE SURVEY  COMPARISON:  CT ANGIO CHEST AORTA W/CM & WO/CM dated 01/28/2014; CT CTA ABD/PEL W/CM AND/OR W/O CM dated 01/28/2014  FINDINGS: The calvarium is adequately mineralized with no lytic or blastic lesion demonstrated.  The cervical spine exhibits postsurgical change at the C5-6 disc with an intradiscal device present. There are degenerative osteophytes at C2-3 and C3-4 and C4-5. No lytic or blastic bony lesion is demonstrated. The thoracic and lumbar vertebral bodies are preserved in height. The intervertebral disc space heights are well maintained. No lytic nor blastic lesions are demonstrated. The known abnormalities demonstrated on today's CT scan within the thoracic and lumbar vertebral bodies are below the level of sensitivity of the plain film technique. No abnormal paravertebral soft tissue densities are demonstrated.  The iliac and pubic bones are well mineralized. There is a 5 mm diameter lucency in the inferior pubic ramus on the right. There are known sub cm lytic lesions within the medial aspects of both iliac bones as well as in the ischia bilaterally.  There is a well-circumscribed lesion at the base of the neck of the left femur. This measures a approximately 11 mm in diameter and was visible on today's CT scan. The remainder of the left femur appears normal. The right femur is normal in appearance.  The clavicles and humeri exhibit no lytic nor blastic lesions.  The chest film reveals the lungs to be adequately inflated.  There is no focal infiltrate. The cardiopericardial silhouette is normal in size. No pleural effusion is demonstrated. The observed portions of the rib cage exhibit no acute abnormalities.  IMPRESSION: The patient has known lytic lesions throughout the spine as well as within the sternum and bony pelvis and proximal femurs based on the CT scan today. Most are inapparent on this metabolic survey series. Lesions in the bony pelvis and left femur are visible. The known thoracic and lumbar spine lesions are not clearly evident. The findings are worrisome for widespread metastatic disease or for myeloma.   Electronically Signed   By: David  Martinique   On: 01/28/2014 18:03   Ct Angio Chest Aortic Dissect W &/or W/o  01/28/2014   CLINICAL DATA:  Sudden onset of tearing abdominal pain. Possible aortic dissection.  EXAM: CT ANGIOGRAPHY CHEST, ABDOMEN AND PELVIS  TECHNIQUE: Multidetector CT imaging through the chest, abdomen and pelvis was performed using the standard protocol during bolus administration of intravenous contrast. Multiplanar reconstructed images and MIPs were obtained and reviewed to evaluate the vascular anatomy.  CONTRAST:  175m OMNIPAQUE IOHEXOL 350 MG/ML SOLN  COMPARISON:  CT of the abdomen and pelvis 02/21/2013.  FINDINGS: CTA CHEST FINDINGS  Mediastinum: No acute abnormality of the thoracic aorta; specifically, no aneurysm or dissection. Additionally on precontrast images there is no crescentic high attenuation associated with the wall of the thoracic aorta to suggest acute intramural hemorrhage. Heart size is normal. There is no  significant pericardial fluid, thickening or pericardial calcification. No pathologically enlarged mediastinal or hilar lymph nodes. Esophagus is unremarkable in appearance.  Lungs/Pleura: Assessment of the lung parenchyma is limited by considerable patient motion. With these limitations in mind there are no definite large suspicious appearing pulmonary nodules or masses  identified. No acute consolidative airspace disease. No pleural effusions. Although the examination was not specifically tailored to evaluate for pulmonary embolism, opacification of the pulmonary arteries is sufficient to is exclude the presence of clinically relevant central, lobar or segmental sized pulmonary emboli. Minimal subsegmental atelectasis in the lower lobes of the lungs bilaterally.  Musculoskeletal: Innumerable lucent lesions are seen scattered throughout the visualized axial and appendicular skeleton.  Review of the MIP images confirms the above findings.  CTA ABDOMEN AND PELVIS FINDINGS  Abdomen/Pelvis: No acute abnormality of the abdominal aorta or major abdominal and pelvic vasculature. Specifically, no evidence of acute intracranial hemorrhage, dissection or aneurysm. Celiac axis, superior mesenteric artery and inferior mesenteric artery is well as their major branches are all widely patent. Single renal arteries are noted bilaterally.  The appearance of the liver, gallbladder, pancreas, spleen, bilateral adrenal glands and bilateral kidneys is unremarkable. No significant volume of ascites. No pneumoperitoneum. No pathologic distention of small bowel. No definite pathologic will discretion no definite lymphadenopathy identified within the abdomen or pelvis.  Musculoskeletal: Innumerable small lytic lesions are again noted throughout the visualized axial and appendicular skeleton. Some of these appear similar to the prior study, while others are new and some have clearly enlarged. The best example of an enlarging lesion is in the posteromedial aspect of the left femoral neck on image 308 of series 6 where there is a lesion that now demonstrates significant endosteal scalloping bordering on frank cortical destruction. No definite associated pathologic fractures are identified at this time.  Review of the MIP images confirms the above findings.  IMPRESSION: 1. No acute abnormality of the thoracic  abdominal aorta. 2. Widespread lytic lesions throughout the visualized axial and appendicular skeleton, clearly progressed compared to the prior examination, highly concerning for systemic disease such as multiple myeloma. Alternatively, this could represent widespread metastatic disease from an unknown primary. Hematology referral for work up for potential multiple myeloma is strongly advised at this time. Should that workup reveal no evidence of multiple myeloma, further evaluation with PET-CT may be warranted to assess for potential occult primary malignancy. 3. No definite acute findings in the chest, abdomen or pelvis to account for the patient's symptoms.   Electronically Signed   By: Vinnie Langton M.D.   On: 01/28/2014 13:27   Ct Angio Abd/pel W/ And/or W/o  01/28/2014   CLINICAL DATA:  Sudden onset of tearing abdominal pain. Possible aortic dissection.  EXAM: CT ANGIOGRAPHY CHEST, ABDOMEN AND PELVIS  TECHNIQUE: Multidetector CT imaging through the chest, abdomen and pelvis was performed using the standard protocol during bolus administration of intravenous contrast. Multiplanar reconstructed images and MIPs were obtained and reviewed to evaluate the vascular anatomy.  CONTRAST:  169m OMNIPAQUE IOHEXOL 350 MG/ML SOLN  COMPARISON:  CT of the abdomen and pelvis 02/21/2013.  FINDINGS: CTA CHEST FINDINGS  Mediastinum: No acute abnormality of the thoracic aorta; specifically, no aneurysm or dissection. Additionally on precontrast images there is no crescentic high attenuation associated with the wall of the thoracic aorta to suggest acute intramural hemorrhage. Heart size is normal. There is no significant pericardial fluid, thickening or pericardial calcification. No pathologically enlarged mediastinal or hilar lymph nodes. Esophagus is unremarkable in appearance.  Lungs/Pleura: Assessment of the lung parenchyma is limited by considerable patient motion. With these limitations in mind there are no definite  large suspicious appearing pulmonary nodules or masses identified. No acute consolidative airspace disease. No pleural effusions. Although the examination was not specifically tailored to evaluate for pulmonary embolism, opacification of the pulmonary arteries is sufficient to is exclude the presence of clinically relevant central, lobar or segmental sized pulmonary emboli. Minimal subsegmental atelectasis in the lower lobes of the lungs bilaterally.  Musculoskeletal: Innumerable lucent lesions are seen scattered throughout the visualized axial and appendicular skeleton.  Review of the MIP images confirms the above findings.  CTA ABDOMEN AND PELVIS FINDINGS  Abdomen/Pelvis: No acute abnormality of the abdominal aorta or major abdominal and pelvic vasculature. Specifically, no evidence of acute intracranial hemorrhage, dissection or aneurysm. Celiac axis, superior mesenteric artery and inferior mesenteric artery is well as their major branches are all widely patent. Single renal arteries are noted bilaterally.  The appearance of the liver, gallbladder, pancreas, spleen, bilateral adrenal glands and bilateral kidneys is unremarkable. No significant volume of ascites. No pneumoperitoneum. No pathologic distention of small bowel. No definite pathologic will discretion no definite lymphadenopathy identified within the abdomen or pelvis.  Musculoskeletal: Innumerable small lytic lesions are again noted throughout the visualized axial and appendicular skeleton. Some of these appear similar to the prior study, while others are new and some have clearly enlarged. The best example of an enlarging lesion is in the posteromedial aspect of the left femoral neck on image 308 of series 6 where there is a lesion that now demonstrates significant endosteal scalloping bordering on frank cortical destruction. No definite associated pathologic fractures are identified at this time.  Review of the MIP images confirms the above findings.   IMPRESSION: 1. No acute abnormality of the thoracic abdominal aorta. 2. Widespread lytic lesions throughout the visualized axial and appendicular skeleton, clearly progressed compared to the prior examination, highly concerning for systemic disease such as multiple myeloma. Alternatively, this could represent widespread metastatic disease from an unknown primary. Hematology referral for work up for potential multiple myeloma is strongly advised at this time. Should that workup reveal no evidence of multiple myeloma, further evaluation with PET-CT may be warranted to assess for potential occult primary malignancy. 3. No definite acute findings in the chest, abdomen or pelvis to account for the patient's symptoms.   Electronically Signed   By: Vinnie Langton M.D.   On: 01/28/2014 13:27   PREVIOUS ENDOSCOPIES:            none   Impression / Plan:    75. 53 year old male with multiple lytic bone lesions on imaging studies. He is s/p bone marrow biopsy this am by IR. Oncology has been asked to evaluate   2. Acute renal insufficiency, resolved.   3. Acute right mid abdominal pain, 2nd episode since April. Etiology unclear, abdominal imaging unrevealing for GI source of pain. No evidence for bowel ischemia, masses, etc... Suppose this could be musculoskeletal pain. He does describe what sounds like constipation which could be a contributing factor. Will start him on Miralax. Further recommendations to follow.  Thanks   LOS: 2 days   Tye Savoy  01/30/2014, 12:00 PM  Attending MD note:   I have taken a history, examined the patient, and reviewed the chart. I agree with the Advanced Practitioner's impression and recommendations. On  The rectal exam, he is impacted, heme negative. I believe his abdominal pain is caused by constipation.  We will give clean his colon while he is being evaluated for MMyeloma. He will need a colonoscopy, possibly while in the hospiral or as an outpatient. His abdominal  pain has subsided but he is still tender across transverse and right colon. Doubt ischemic colitis.  [facility] Pager # [phone]

## 2014-01-30 NOTE — Clinical Documentation Improvement (Signed)
  Possible Clinical Conditions?      Acute Renal Failure/Acute Kidney Injury    Other Condition _________________  Supporting Information:  Acute Renal Insufficiency documented in a consult note.   Diagnostics:  BUN/Creatinine on admission was 1.39 mg/dL and 57 mL/min.   Thank You,  Posey Pronto, RN, BSN, [facility] Documentation Improvement Specialist HIM department--[place] Office [phone]

## 2014-01-30 NOTE — Consult Note (Signed)
HPI: Jimmy Day is an 53 y.o. male admitted with abdominal pain but found to have numerous bony lytic lesions. IR is asked to assist workup by means of bone lesion/marrow biopsy. PMHx and meds reviewed.   Past Medical History:  Past Medical History  Diagnosis Date  . Fasting hyperglycemia     NORMAL A1c  . Hyperlipidemia   . Testosterone deficiency     Dr Hartley Barefoot  . Hypertension     Past Surgical History:  Past Surgical History  Procedure Laterality Date  . Lipomas removal      BENIGN FROM CHEST AND BACK  . Cervical plate insertion      POST COMPRESSION DISC INJURY    Family History:  Family History  Problem Relation Age of Onset  . Breast cancer Mother   . Heart attack Father 59  . Kidney disease Brother     RENAL FAILURE  . Hypertension Brother   . Heart attack Maternal Aunt 65    CABG  . Stroke Maternal Uncle     CVA  . Diabetes Neg Hx     Social History:  reports that he has never smoked. He does not have any smokeless tobacco history on file. He reports that he drinks alcohol. He reports that he does not use illicit drugs.  Allergies: No Known Allergies  Medications:   Medication List    ASK your doctor about these medications       amLODipine 5 MG tablet  Commonly known as:  NORVASC  Take 5 mg by mouth daily.     atorvastatin 20 MG tablet  Commonly known as:  LIPITOR  Take 1 tablet (20 mg total) by mouth daily.     TYLENOL COLD PO  Take 2 tablets by mouth daily as needed (cold / congestion).        Please HPI for pertinent positives, otherwise complete 10 system ROS negative.  Physical Exam: BP 152/74  Pulse 67  Temp(Src) 98 F (36.7 C) (Oral)  Resp 18  Ht 5' 11" (1.803 m)  Wt 194 lb 14.2 oz (88.4 kg)  BMI 27.19 kg/m2  SpO2 96% Body mass index is 27.19 kg/(m^2).   General Appearance:  Alert, cooperative, no distress, appears stated age  Head:  Normocephalic, without obvious abnormality, atraumatic  ENT: Unremarkable  Neck:  Supple, symmetrical, trachea midline  Lungs:   Clear to auscultation bilaterally, no w/r/r, respirations unlabored without use of accessory muscles.  Chest Wall:  No tenderness or deformity  Heart:  Regular rate and rhythm, S1, S2 normal, no murmur, rub or gallop.  Abdomen:   Soft, non-tender, non distended.  Neurologic: Normal affect, no gross deficits.   Results for orders placed during the hospital encounter of 01/28/14 (from the past 48 hour(s))  CBC     Status: Abnormal   Collection Time    01/28/14 12:29 PM      Result Value Ref Range   WBC 6.6  4.0 - 10.5 K/uL   RBC 5.42  4.22 - 5.81 MIL/uL   Hemoglobin 17.4 (*) 13.0 - 17.0 g/dL   HCT 47.9  39.0 - 52.0 %   MCV 88.4  78.0 - 100.0 fL   MCH 32.1  26.0 - 34.0 pg   MCHC 36.3 (*) 30.0 - 36.0 g/dL   RDW 13.3  11.5 - 15.5 %   Platelets 197  150 - 400 K/uL  COMPREHENSIVE METABOLIC PANEL     Status: Abnormal   Collection Time  01/28/14 12:29 PM      Result Value Ref Range   Sodium 142  137 - 147 mEq/L   Potassium 3.7  3.7 - 5.3 mEq/L   Chloride 99  96 - 112 mEq/L   CO2 25  19 - 32 mEq/L   Glucose, Bld 112 (*) 70 - 99 mg/dL   BUN 12  6 - 23 mg/dL   Creatinine, Ser 1.39 (*) 0.50 - 1.35 mg/dL   Calcium 9.9  8.4 - 10.5 mg/dL   Total Protein 7.8  6.0 - 8.3 g/dL   Albumin 4.3  3.5 - 5.2 g/dL   AST 21  0 - 37 U/L   ALT 17  0 - 53 U/L   Alkaline Phosphatase 54  39 - 117 U/L   Total Bilirubin 0.5  0.3 - 1.2 mg/dL   GFR calc non Af Amer 57 (*) >90 mL/min   GFR calc Af Amer 66 (*) >90 mL/min   Comment: (NOTE)     The eGFR has been calculated using the CKD EPI equation.     This calculation has not been validated in all clinical situations.     eGFR's persistently <90 mL/min signify possible Chronic Kidney     Disease.  LIPASE, BLOOD     Status: None   Collection Time    01/28/14 12:29 PM      Result Value Ref Range   Lipase 30  11 - 59 U/L  TYPE AND SCREEN     Status: None   Collection Time    01/28/14 12:30 PM      Result  Value Ref Range   ABO/RH(D) O POS     Antibody Screen NEG     Sample Expiration 01/31/2014    ABO/RH     Status: None   Collection Time    01/28/14 12:30 PM      Result Value Ref Range   ABO/RH(D) O POS    I-STAT TROPOININ, ED     Status: None   Collection Time    01/28/14  1:02 PM      Result Value Ref Range   Troponin i, poc 0.01  0.00 - 0.08 ng/mL   Comment 3            Comment: Due to the release kinetics of cTnI,     a negative result within the first hours     of the onset of symptoms does not rule out     myocardial infarction with certainty.     If myocardial infarction is still suspected,     repeat the test at appropriate intervals.  I-STAT CG4 LACTIC ACID, ED     Status: Abnormal   Collection Time    01/28/14  1:04 PM      Result Value Ref Range   Lactic Acid, Venous 3.80 (*) 0.5 - 2.2 mmol/L  URINE RAPID DRUG SCREEN (HOSP PERFORMED)     Status: Abnormal   Collection Time    01/28/14  3:05 PM      Result Value Ref Range   Opiates POSITIVE (*) NONE DETECTED   Cocaine NONE DETECTED  NONE DETECTED   Benzodiazepines NONE DETECTED  NONE DETECTED   Amphetamines NONE DETECTED  NONE DETECTED   Tetrahydrocannabinol NONE DETECTED  NONE DETECTED   Barbiturates NONE DETECTED  NONE DETECTED   Comment:            DRUG SCREEN FOR MEDICAL PURPOSES     ONLY.  IF  CONFIRMATION IS NEEDED     FOR ANY PURPOSE, NOTIFY LAB     WITHIN 5 DAYS.                LOWEST DETECTABLE LIMITS     FOR URINE DRUG SCREEN     Drug Class       Cutoff (ng/mL)     Amphetamine      1000     Barbiturate      200     Benzodiazepine   161     Tricyclics       096     Opiates          300     Cocaine          300     THC              50  SODIUM, URINE, RANDOM     Status: None   Collection Time    01/28/14  3:05 PM      Result Value Ref Range   Sodium, Ur 110    CREATININE, URINE, RANDOM     Status: None   Collection Time    01/28/14  3:05 PM      Result Value Ref Range   Creatinine, Urine  40.56    LACTATE DEHYDROGENASE     Status: None   Collection Time    01/28/14  6:00 PM      Result Value Ref Range   LDH 182  94 - 250 U/L  C-REACTIVE PROTEIN     Status: Abnormal   Collection Time    01/28/14  6:00 PM      Result Value Ref Range   CRP <0.5 (*) <0.60 mg/dL   Comment: Performed at [facility]  TROPONIN I     Status: None   Collection Time    01/28/14  6:00 PM      Result Value Ref Range   Troponin I <0.30  <0.30 ng/mL   Comment:            Due to the release kinetics of cTnI,     a negative result within the first hours     of the onset of symptoms does not rule out     myocardial infarction with certainty.     If myocardial infarction is still suspected,     repeat the test at appropriate intervals.  MAGNESIUM     Status: None   Collection Time    01/28/14  6:00 PM      Result Value Ref Range   Magnesium 2.1  1.5 - 2.5 mg/dL  COMPREHENSIVE METABOLIC PANEL     Status: Abnormal   Collection Time    01/29/14  2:40 AM      Result Value Ref Range   Sodium 139  137 - 147 mEq/L   Potassium 4.3  3.7 - 5.3 mEq/L   Chloride 102  96 - 112 mEq/L   CO2 27  19 - 32 mEq/L   Glucose, Bld 138 (*) 70 - 99 mg/dL   BUN 9  6 - 23 mg/dL   Creatinine, Ser 1.03  0.50 - 1.35 mg/dL   Calcium 8.2 (*) 8.4 - 10.5 mg/dL   Total Protein 6.5  6.0 - 8.3 g/dL   Albumin 3.3 (*) 3.5 - 5.2 g/dL   AST 16  0 - 37 U/L   ALT 12  0 - 53 U/L   Alkaline Phosphatase 44  39 - 117 U/L   Total Bilirubin 0.3  0.3 - 1.2 mg/dL   GFR calc non Af Amer 82 (*) >90 mL/min   GFR calc Af Amer >90  >90 mL/min   Comment: (NOTE)     The eGFR has been calculated using the CKD EPI equation.     This calculation has not been validated in all clinical situations.     eGFR's persistently <90 mL/min signify possible Chronic Kidney     Disease.  CBC     Status: None   Collection Time    01/29/14  2:40 AM      Result Value Ref Range   WBC 5.0  4.0 - 10.5 K/uL   RBC 4.79  4.22 - 5.81 MIL/uL    Hemoglobin 15.2  13.0 - 17.0 g/dL   HCT 42.4  39.0 - 52.0 %   MCV 88.5  78.0 - 100.0 fL   MCH 31.7  26.0 - 34.0 pg   MCHC 35.8  30.0 - 36.0 g/dL   RDW 13.3  11.5 - 15.5 %   Platelets 167  150 - 400 K/uL  PROTIME-INR     Status: None   Collection Time    01/29/14  2:40 AM      Result Value Ref Range   Prothrombin Time 14.3  11.6 - 15.2 seconds   INR 1.13  0.00 - 1.49  TROPONIN I     Status: None   Collection Time    01/29/14  2:40 AM      Result Value Ref Range   Troponin I <0.30  <0.30 ng/mL   Comment:            Due to the release kinetics of cTnI,     a negative result within the first hours     of the onset of symptoms does not rule out     myocardial infarction with certainty.     If myocardial infarction is still suspected,     repeat the test at appropriate intervals.  TROPONIN I     Status: None   Collection Time    01/29/14  8:40 AM      Result Value Ref Range   Troponin I <0.30  <0.30 ng/mL   Comment:            Due to the release kinetics of cTnI,     a negative result within the first hours     of the onset of symptoms does not rule out     myocardial infarction with certainty.     If myocardial infarction is still suspected,     repeat the test at appropriate intervals.   Mr Thoracic Spine W Wo Contrast  01/29/2014   CLINICAL DATA:  Abdominal pain.  Lytic spine lesions on CT.  EXAM: MRI THORACIC AND LUMBAR SPINE WITHOUT AND WITH CONTRAST  TECHNIQUE: Multiplanar and multiecho pulse sequences of the thoracic and lumbar spine were obtained without and with intravenous contrast.  CONTRAST:  74m MULTIHANCE GADOBENATE DIMEGLUMINE 529 MG/ML IV SOLN  COMPARISON:  CT abdomen and pelvis 01/28/2014  FINDINGS: MR THORACIC SPINE FINDINGS  Numerous a small punched-out lesions are present throughout the thoracic spine. These are actually better seen on CT than MRI. Some of them show mild enhancement. The largest lesion is on the left side of the T3 vertebral body. Negative for  fracture. No epidural tumor or cord compression is identified.  Mild disc and facet degeneration at T9-10 causing  mild spinal stenosis. There is mild foraminal narrowing bilaterally. Mild disc and facet degeneration also at T10-11 with mild spinal stenosis. Mild disc degeneration at T7-8 and T8-9.  Thoracic spinal cord signal is normal. Minimal pleural effusion bilaterally.  MR LUMBAR SPINE FINDINGS  Small lytic punched out lesions in the lumbar spine are best seen on the CT. Some of these lesions may show minimal enhancement. No fracture or bone marrow edema. Negative for epidural tumor or spinal stenosis. Conus medullaris is normal and terminates at L1-2.  L1-2:  Negative  L2-3:  Negative  L3-4:  Mild facet degeneration  L4-5: Left foraminal disc protrusion with impingement of the left L4 nerve root in the foramen. Mild spinal stenosis. Bilateral facet hypertrophy.  L5-S1:  Mild disc and facet degeneration.  IMPRESSION: Numerous small lytic lesions in the thoracic and lumbar spine, best seen on the recent CT. Pattern is highly suspicious for multiple myeloma.  Negative for fracture.  No epidural tumor or cord compression  Left foraminal disc protrusion L4-5 with left L4 nerve root impingement.   Electronically Signed   By: Franchot Gallo M.D.   On: 01/29/2014 18:50   Mr Lumbar Spine W Wo Contrast  01/29/2014   CLINICAL DATA:  Abdominal pain.  Lytic spine lesions on CT.  EXAM: MRI THORACIC AND LUMBAR SPINE WITHOUT AND WITH CONTRAST  TECHNIQUE: Multiplanar and multiecho pulse sequences of the thoracic and lumbar spine were obtained without and with intravenous contrast.  CONTRAST:  47m MULTIHANCE GADOBENATE DIMEGLUMINE 529 MG/ML IV SOLN  COMPARISON:  CT abdomen and pelvis 01/28/2014  FINDINGS: MR THORACIC SPINE FINDINGS  Numerous a small punched-out lesions are present throughout the thoracic spine. These are actually better seen on CT than MRI. Some of them show mild enhancement. The largest lesion is on the  left side of the T3 vertebral body. Negative for fracture. No epidural tumor or cord compression is identified.  Mild disc and facet degeneration at T9-10 causing mild spinal stenosis. There is mild foraminal narrowing bilaterally. Mild disc and facet degeneration also at T10-11 with mild spinal stenosis. Mild disc degeneration at T7-8 and T8-9.  Thoracic spinal cord signal is normal. Minimal pleural effusion bilaterally.  MR LUMBAR SPINE FINDINGS  Small lytic punched out lesions in the lumbar spine are best seen on the CT. Some of these lesions may show minimal enhancement. No fracture or bone marrow edema. Negative for epidural tumor or spinal stenosis. Conus medullaris is normal and terminates at L1-2.  L1-2:  Negative  L2-3:  Negative  L3-4:  Mild facet degeneration  L4-5: Left foraminal disc protrusion with impingement of the left L4 nerve root in the foramen. Mild spinal stenosis. Bilateral facet hypertrophy.  L5-S1:  Mild disc and facet degeneration.  IMPRESSION: Numerous small lytic lesions in the thoracic and lumbar spine, best seen on the recent CT. Pattern is highly suspicious for multiple myeloma.  Negative for fracture.  No epidural tumor or cord compression  Left foraminal disc protrusion L4-5 with left L4 nerve root impingement.   Electronically Signed   By: CFranchot GalloM.D.   On: 01/29/2014 18:50   Dg Bone Survey Met  01/28/2014   CLINICAL DATA:  Lytic lesion on today's CT scan of the chest and abdomen and pelvis  EXAM: METASTATIC BONE SURVEY  COMPARISON:  CT ANGIO CHEST AORTA W/CM & WO/CM dated 01/28/2014; CT CTA ABD/PEL W/CM AND/OR W/O CM dated 01/28/2014  FINDINGS: The calvarium is adequately mineralized with no lytic or blastic  lesion demonstrated.  The cervical spine exhibits postsurgical change at the C5-6 disc with an intradiscal device present. There are degenerative osteophytes at C2-3 and C3-4 and C4-5. No lytic or blastic bony lesion is demonstrated. The thoracic and lumbar vertebral  bodies are preserved in height. The intervertebral disc space heights are well maintained. No lytic nor blastic lesions are demonstrated. The known abnormalities demonstrated on today's CT scan within the thoracic and lumbar vertebral bodies are below the level of sensitivity of the plain film technique. No abnormal paravertebral soft tissue densities are demonstrated.  The iliac and pubic bones are well mineralized. There is a 5 mm diameter lucency in the inferior pubic ramus on the right. There are known sub cm lytic lesions within the medial aspects of both iliac bones as well as in the ischia bilaterally.  There is a well-circumscribed lesion at the base of the neck of the left femur. This measures a approximately 11 mm in diameter and was visible on today's CT scan. The remainder of the left femur appears normal. The right femur is normal in appearance.  The clavicles and humeri exhibit no lytic nor blastic lesions.  The chest film reveals the lungs to be adequately inflated. There is no focal infiltrate. The cardiopericardial silhouette is normal in size. No pleural effusion is demonstrated. The observed portions of the rib cage exhibit no acute abnormalities.  IMPRESSION: The patient has known lytic lesions throughout the spine as well as within the sternum and bony pelvis and proximal femurs based on the CT scan today. Most are inapparent on this metabolic survey series. Lesions in the bony pelvis and left femur are visible. The known thoracic and lumbar spine lesions are not clearly evident. The findings are worrisome for widespread metastatic disease or for myeloma.   Electronically Signed   By: David  Martinique   On: 01/28/2014 18:03   Ct Angio Chest Aortic Dissect W &/or W/o  01/28/2014   CLINICAL DATA:  Sudden onset of tearing abdominal pain. Possible aortic dissection.  EXAM: CT ANGIOGRAPHY CHEST, ABDOMEN AND PELVIS  TECHNIQUE: Multidetector CT imaging through the chest, abdomen and pelvis was performed  using the standard protocol during bolus administration of intravenous contrast. Multiplanar reconstructed images and MIPs were obtained and reviewed to evaluate the vascular anatomy.  CONTRAST:  1109m OMNIPAQUE IOHEXOL 350 MG/ML SOLN  COMPARISON:  CT of the abdomen and pelvis 02/21/2013.  FINDINGS: CTA CHEST FINDINGS  Mediastinum: No acute abnormality of the thoracic aorta; specifically, no aneurysm or dissection. Additionally on precontrast images there is no crescentic high attenuation associated with the wall of the thoracic aorta to suggest acute intramural hemorrhage. Heart size is normal. There is no significant pericardial fluid, thickening or pericardial calcification. No pathologically enlarged mediastinal or hilar lymph nodes. Esophagus is unremarkable in appearance.  Lungs/Pleura: Assessment of the lung parenchyma is limited by considerable patient motion. With these limitations in mind there are no definite large suspicious appearing pulmonary nodules or masses identified. No acute consolidative airspace disease. No pleural effusions. Although the examination was not specifically tailored to evaluate for pulmonary embolism, opacification of the pulmonary arteries is sufficient to is exclude the presence of clinically relevant central, lobar or segmental sized pulmonary emboli. Minimal subsegmental atelectasis in the lower lobes of the lungs bilaterally.  Musculoskeletal: Innumerable lucent lesions are seen scattered throughout the visualized axial and appendicular skeleton.  Review of the MIP images confirms the above findings.  CTA ABDOMEN AND PELVIS FINDINGS  Abdomen/Pelvis: No acute  abnormality of the abdominal aorta or major abdominal and pelvic vasculature. Specifically, no evidence of acute intracranial hemorrhage, dissection or aneurysm. Celiac axis, superior mesenteric artery and inferior mesenteric artery is well as their major branches are all widely patent. Single renal arteries are noted  bilaterally.  The appearance of the liver, gallbladder, pancreas, spleen, bilateral adrenal glands and bilateral kidneys is unremarkable. No significant volume of ascites. No pneumoperitoneum. No pathologic distention of small bowel. No definite pathologic will discretion no definite lymphadenopathy identified within the abdomen or pelvis.  Musculoskeletal: Innumerable small lytic lesions are again noted throughout the visualized axial and appendicular skeleton. Some of these appear similar to the prior study, while others are new and some have clearly enlarged. The best example of an enlarging lesion is in the posteromedial aspect of the left femoral neck on image 308 of series 6 where there is a lesion that now demonstrates significant endosteal scalloping bordering on frank cortical destruction. No definite associated pathologic fractures are identified at this time.  Review of the MIP images confirms the above findings.  IMPRESSION: 1. No acute abnormality of the thoracic abdominal aorta. 2. Widespread lytic lesions throughout the visualized axial and appendicular skeleton, clearly progressed compared to the prior examination, highly concerning for systemic disease such as multiple myeloma. Alternatively, this could represent widespread metastatic disease from an unknown primary. Hematology referral for work up for potential multiple myeloma is strongly advised at this time. Should that workup reveal no evidence of multiple myeloma, further evaluation with PET-CT may be warranted to assess for potential occult primary malignancy. 3. No definite acute findings in the chest, abdomen or pelvis to account for the patient's symptoms.   Electronically Signed   By: Vinnie Langton M.D.   On: 01/28/2014 13:27   Ct Angio Abd/pel W/ And/or W/o  01/28/2014   CLINICAL DATA:  Sudden onset of tearing abdominal pain. Possible aortic dissection.  EXAM: CT ANGIOGRAPHY CHEST, ABDOMEN AND PELVIS  TECHNIQUE: Multidetector CT  imaging through the chest, abdomen and pelvis was performed using the standard protocol during bolus administration of intravenous contrast. Multiplanar reconstructed images and MIPs were obtained and reviewed to evaluate the vascular anatomy.  CONTRAST:  175m OMNIPAQUE IOHEXOL 350 MG/ML SOLN  COMPARISON:  CT of the abdomen and pelvis 02/21/2013.  FINDINGS: CTA CHEST FINDINGS  Mediastinum: No acute abnormality of the thoracic aorta; specifically, no aneurysm or dissection. Additionally on precontrast images there is no crescentic high attenuation associated with the wall of the thoracic aorta to suggest acute intramural hemorrhage. Heart size is normal. There is no significant pericardial fluid, thickening or pericardial calcification. No pathologically enlarged mediastinal or hilar lymph nodes. Esophagus is unremarkable in appearance.  Lungs/Pleura: Assessment of the lung parenchyma is limited by considerable patient motion. With these limitations in mind there are no definite large suspicious appearing pulmonary nodules or masses identified. No acute consolidative airspace disease. No pleural effusions. Although the examination was not specifically tailored to evaluate for pulmonary embolism, opacification of the pulmonary arteries is sufficient to is exclude the presence of clinically relevant central, lobar or segmental sized pulmonary emboli. Minimal subsegmental atelectasis in the lower lobes of the lungs bilaterally.  Musculoskeletal: Innumerable lucent lesions are seen scattered throughout the visualized axial and appendicular skeleton.  Review of the MIP images confirms the above findings.  CTA ABDOMEN AND PELVIS FINDINGS  Abdomen/Pelvis: No acute abnormality of the abdominal aorta or major abdominal and pelvic vasculature. Specifically, no evidence of acute intracranial hemorrhage, dissection or  aneurysm. Celiac axis, superior mesenteric artery and inferior mesenteric artery is well as their major  branches are all widely patent. Single renal arteries are noted bilaterally.  The appearance of the liver, gallbladder, pancreas, spleen, bilateral adrenal glands and bilateral kidneys is unremarkable. No significant volume of ascites. No pneumoperitoneum. No pathologic distention of small bowel. No definite pathologic will discretion no definite lymphadenopathy identified within the abdomen or pelvis.  Musculoskeletal: Innumerable small lytic lesions are again noted throughout the visualized axial and appendicular skeleton. Some of these appear similar to the prior study, while others are new and some have clearly enlarged. The best example of an enlarging lesion is in the posteromedial aspect of the left femoral neck on image 308 of series 6 where there is a lesion that now demonstrates significant endosteal scalloping bordering on frank cortical destruction. No definite associated pathologic fractures are identified at this time.  Review of the MIP images confirms the above findings.  IMPRESSION: 1. No acute abnormality of the thoracic abdominal aorta. 2. Widespread lytic lesions throughout the visualized axial and appendicular skeleton, clearly progressed compared to the prior examination, highly concerning for systemic disease such as multiple myeloma. Alternatively, this could represent widespread metastatic disease from an unknown primary. Hematology referral for work up for potential multiple myeloma is strongly advised at this time. Should that workup reveal no evidence of multiple myeloma, further evaluation with PET-CT may be warranted to assess for potential occult primary malignancy. 3. No definite acute findings in the chest, abdomen or pelvis to account for the patient's symptoms.   Electronically Signed   By: Vinnie Langton M.D.   On: 01/28/2014 13:27    Assessment/Plan Multiple lytic bone lesions. Plan for CT guided biopsy of iliac lytic lesion/marrow  Explained procedure, risks,  complications, use of sedation. Labs reviewed. Coordinated with cyto/path to do today Consent signed in chart  Ascencion Dike PA-C 01/30/2014, 9:39 AM    +

## 2014-01-31 ENCOUNTER — Telehealth: Payer: Self-pay | Admitting: Hematology & Oncology

## 2014-01-31 ENCOUNTER — Encounter: Payer: Self-pay | Admitting: Internal Medicine

## 2014-01-31 DIAGNOSIS — M899 Disorder of bone, unspecified: Secondary | ICD-10-CM

## 2014-01-31 DIAGNOSIS — I10 Essential (primary) hypertension: Secondary | ICD-10-CM

## 2014-01-31 DIAGNOSIS — M949 Disorder of cartilage, unspecified: Secondary | ICD-10-CM

## 2014-01-31 LAB — KAPPA/LAMBDA LIGHT CHAINS
Kappa free light chain: 6.3 mg/dL — ABNORMAL HIGH (ref 0.33–1.94)
Kappa, lambda light chain ratio: 3.64 — ABNORMAL HIGH (ref 0.26–1.65)
LAMDA FREE LIGHT CHAINS: 1.73 mg/dL (ref 0.57–2.63)

## 2014-01-31 LAB — BETA 2 MICROGLOBULIN, SERUM: Beta-2 Microglobulin: 3.05 mg/L — ABNORMAL HIGH (ref <=2.51)

## 2014-01-31 MED ORDER — POLYETHYLENE GLYCOL 3350 17 G PO PACK: 17.0000 g | PACK | Freq: Every day | ORAL | Status: DC

## 2014-01-31 NOTE — Discharge Instructions (Signed)
Take Miralax daily unless you are having diarrhea.  See Dr. Linna Darner regarding your test results.  He may refer you to Dr. Marin Olp for treatment.

## 2014-01-31 NOTE — Discharge Summary (Signed)
Physician Discharge Summary  Leeland Lovelady Celestin XBW:[id number] DOB: 1961/01/24 DOA: 01/28/2014  PCP: Unice Cobble, MD  Admit date: 01/28/2014 Discharge date: 01/31/2014  Time spent: 45 minutes  Recommendations for Outpatient Follow-up:  1. Follow up with PCP within one week to discuss Bone/Bone Marrow Biopsy results 2. Appointment with Dr. Martha Clan scheduled. 3. Outpatient colonoscopy scheduled  Discharge Diagnoses:  Principal Problem:  Abdominal Pain -Likely due to constipation -CT Angio of chest/abdomen/pelvis negative for any acute abnormality or ischemia -Comfortable bowel movements after stool softeners -Appreciate consult by Dr. Olevia Perches of [facility] whose impression was abdominal pain was  due to constipation--recommended miralax -Normal diet tolerated and pain resolved on 3/11  Acute renal insufficiency -Pre-Renal -Resolved  Lytic Bone Lesions  -Concerning for myeloma, however the patient is not anemic.  -CT on 3/8 showed widespread lytic lesions in axial and appendicular skeleton including spine, bony pelvis and proximal femurs  -MRI findings on 3/9 showed numerous small lytic lesions in thoracic and lumbar spine  -Discussed with Dr. Marin Olp who recommended Free light chains (pending), 24 hour urine (elevated total protein to 250m/day) and MRI of the spine (results below) in addition to SPEP (remarkable for low beta globulin of 4.1%) and Bet 2 globulin (pending).  -Patient underwent bone marrow and core bone biopsy (3/10); results pending  Hypertension  -Stable  -Resume amlodipine  Hyperlipidemia  -Continue Atorvastatin   Discharge Condition:Stable  Diet recommendation: Regular  Filed Weights   01/28/14 1536  Weight: 88.4 kg (194 lb 14.2 oz)    History of present illness:  Mr. MCamereris a 53yo African American male with a PMH of HTN and a history of lytic lesions on previous CT (02/21/2013) who presented to the ED with sudden onset of abdominal pain without  radiation. On arrival he reported that nothing made his pain better or worse. He denied any fever, chills, nausea, vomiting, diarrhea, chest pain, or SOB.   Hospital Course:  Patient was admitted for significant abdominal pain that gradually improved with stool softeners and laxatives. He is now tolerating a regular diet and feels well. He denies any pain. CT and MRI findings of bony lytic lesions resulted in workup for multiple myeloma. Bone marrow and core bone biopsy were performed 3/10. Results are pending.   Procedures:  Bone Marrow & Core Bone Biopsy  Consultations:  Dr. [facility] gastro[facility]  Discharge Exam: Filed Vitals:   01/31/14 0953  BP: 132/80  Pulse:   Temp:   Resp:     General: Well developed, well nourished, in NAD  HEENT: PERR, EOMI, MMM.  Neck: Supple, no masses.  Cardiovascular: RRR, S1 S2 auscultated, no rubs, murmurs or gallops.  Respiratory: Clear to auscultation bilaterally with equal chest rise  Abdomen: Soft, nondistended, nontender + bowel sounds Extremities: warm dry without cyanosis clubbing or edema.  Neuro: AAOx3, cranial nerves grossly intact.  Skin: Without rashes exudates or nodules.  Psych: Pleasant, normal affect and demeanor with intact judgement and insight  Discharge Instructions      Discharge Orders   Future Appointments Provider Department Dept Phone   02/13/2014 1:00 PM [phone]  02/22/2014 2:00 PM DL[place]ayette Dragon MD [phone]  Future Orders Complete By Expires   Diet - low sodium heart healthy  As directed    Increase activity slowly  As directed        Medication List  amLODipine 5 MG tablet  Commonly known as:  NORVASC  Take 5 mg by mouth daily.     atorvastatin 20 MG tablet  Commonly known as:  LIPITOR  Take 1 tablet (20 mg total) by mouth daily.     polyethylene glycol packet  Commonly known as:   MIRALAX / GLYCOLAX  Take 17 g by mouth daily.     TYLENOL COLD PO  Take 2 tablets by mouth daily as needed (cold / congestion).       No Known Allergies Follow-up Information   Follow up with Unice Cobble, MD In 1 week.   Specialty:  Internal Medicine   Contact information:   [address] [place] [address] [phone]       Follow up with [name], MD. Dennis Bast may require follow up with Dr. Marin Olp pending your biopsy results.)    Specialty:  Oncology   Contact information:   [address] [place] [address] [phone]       Follow up with [facility] On 02/13/2014. (nurse preop visit at 1 PM.  bring all meds, insurance cards to visit)    Specialty:  Gastroenterology   Contact information:   [place] [address] [phone]      Follow up with [facility] On 02/22/2014. (2 pm for colonoscopy)    Specialty:  Gastroenterology   Contact information:   [place] [address] [phone]       The results of significant diagnostics from this hospitalization (including imaging, microbiology, ancillary and laboratory) are listed below for reference.    Significant Diagnostic Studies: Mr Thoracic Spine W Wo Contrast  01/29/2014   CLINICAL DATA:  Abdominal pain.  Lytic spine lesions on CT.  EXAM: MRI THORACIC AND LUMBAR SPINE WITHOUT AND WITH CONTRAST  TECHNIQUE: Multiplanar and multiecho pulse sequences of the thoracic and lumbar spine were obtained without and with intravenous contrast.  CONTRAST:  78m MULTIHANCE GADOBENATE DIMEGLUMINE 529 MG/ML IV SOLN  COMPARISON:  CT abdomen and pelvis 01/28/2014  FINDINGS: MR THORACIC SPINE FINDINGS  Numerous a small punched-out lesions are present throughout the thoracic spine. These are actually better seen on CT than MRI. Some of them show mild enhancement. The largest lesion is on the left side of the T3 vertebral  body. Negative for fracture. No epidural tumor or cord compression is identified.  Mild disc and facet degeneration at T9-10 causing mild spinal stenosis. There is mild foraminal narrowing bilaterally. Mild disc and facet degeneration also at T10-11 with mild spinal stenosis. Mild disc degeneration at T7-8 and T8-9.  Thoracic spinal cord signal is normal. Minimal pleural effusion bilaterally.  MR LUMBAR SPINE FINDINGS  Small lytic punched out lesions in the lumbar spine are best seen on the CT. Some of these lesions may show minimal enhancement. No fracture or bone marrow edema. Negative for epidural tumor or spinal stenosis. Conus medullaris is normal and terminates at L1-2.  L1-2:  Negative  L2-3:  Negative  L3-4:  Mild facet degeneration  L4-5: Left foraminal disc protrusion with impingement of the left L4 nerve root in the foramen. Mild spinal stenosis. Bilateral facet hypertrophy.  L5-S1:  Mild disc and facet degeneration.  IMPRESSION: Numerous small lytic lesions in the thoracic and lumbar spine, best seen on the recent CT. Pattern is highly suspicious for multiple myeloma.  Negative for fracture.  No epidural tumor or cord compression  Left foraminal disc  protrusion L4-5 with left L4 nerve root impingement.   Electronically Signed   By: Franchot Gallo M.D.   On: 01/29/2014 18:50   Mr Lumbar Spine W Wo Contrast  01/29/2014   CLINICAL DATA:  Abdominal pain.  Lytic spine lesions on CT.  EXAM: MRI THORACIC AND LUMBAR SPINE WITHOUT AND WITH CONTRAST  TECHNIQUE: Multiplanar and multiecho pulse sequences of the thoracic and lumbar spine were obtained without and with intravenous contrast.  CONTRAST:  38m MULTIHANCE GADOBENATE DIMEGLUMINE 529 MG/ML IV SOLN  COMPARISON:  CT abdomen and pelvis 01/28/2014  FINDINGS: MR THORACIC SPINE FINDINGS  Numerous a small punched-out lesions are present throughout the thoracic spine. These are actually better seen on CT than MRI. Some of them show mild enhancement. The largest  lesion is on the left side of the T3 vertebral body. Negative for fracture. No epidural tumor or cord compression is identified.  Mild disc and facet degeneration at T9-10 causing mild spinal stenosis. There is mild foraminal narrowing bilaterally. Mild disc and facet degeneration also at T10-11 with mild spinal stenosis. Mild disc degeneration at T7-8 and T8-9.  Thoracic spinal cord signal is normal. Minimal pleural effusion bilaterally.  MR LUMBAR SPINE FINDINGS  Small lytic punched out lesions in the lumbar spine are best seen on the CT. Some of these lesions may show minimal enhancement. No fracture or bone marrow edema. Negative for epidural tumor or spinal stenosis. Conus medullaris is normal and terminates at L1-2.  L1-2:  Negative  L2-3:  Negative  L3-4:  Mild facet degeneration  L4-5: Left foraminal disc protrusion with impingement of the left L4 nerve root in the foramen. Mild spinal stenosis. Bilateral facet hypertrophy.  L5-S1:  Mild disc and facet degeneration.  IMPRESSION: Numerous small lytic lesions in the thoracic and lumbar spine, best seen on the recent CT. Pattern is highly suspicious for multiple myeloma.  Negative for fracture.  No epidural tumor or cord compression  Left foraminal disc protrusion L4-5 with left L4 nerve root impingement.   Electronically Signed   By: CFranchot GalloM.D.   On: 01/29/2014 18:50   Ct Biopsy  01/30/2014   CLINICAL DATA 53year old with abdominal pain and multiple lucent lesions throughout the skeleton.  EXAM CT GUIDED BONE MARROW ASPIRATES AND BIOPSY  Physician: AStephan Minister HAnselm Pancoast MD  MEDICATIONS 4 mg versed, 100 mcg fentanyl. A radiology nurse monitored the patient for moderate sedation.  ANESTHESIA/SEDATION Sedation time: 20  PROCEDURE The procedure was explained to the patient. The risks and benefits of the procedure were discussed and the patient's questions were addressed. Informed consent was obtained from the patient. The patient was placed prone on CT scan.  Images of the pelvis were obtained. The left side of back was prepped and draped in sterile fashion. The skin and left posterior iliac bone were anesthetized with 1% lidocaine. 11 gauge bone needle was directed into the left iliac bone with CT guidance. Two aspirates and 2 core biopsies performed. One suitable core biopsy was obtained.  FINDINGS The bone needle was placed in the posterior left iliac bone. The bone needle was directed towards multiple small lucent lesions. Trace amount of fluid in the right lower quadrant of the abdomen.  COMPLICATIONS None  IMPRESSION CT guided bone marrow aspirates and core biopsy. The core biopsy was directed through small lucent lesions.  Trace amount of fluid in the abdomen.  SIGNATURE  Electronically Signed   By: AMarkus DaftM.D.   On: 01/30/2014 13:22  Dg Bone Survey Met  01/28/2014   CLINICAL DATA:  Lytic lesion on today's CT scan of the chest and abdomen and pelvis  EXAM: METASTATIC BONE SURVEY  COMPARISON:  CT ANGIO CHEST AORTA W/CM & WO/CM dated 01/28/2014; CT CTA ABD/PEL W/CM AND/OR W/O CM dated 01/28/2014  FINDINGS: The calvarium is adequately mineralized with no lytic or blastic lesion demonstrated.  The cervical spine exhibits postsurgical change at the C5-6 disc with an intradiscal device present. There are degenerative osteophytes at C2-3 and C3-4 and C4-5. No lytic or blastic bony lesion is demonstrated. The thoracic and lumbar vertebral bodies are preserved in height. The intervertebral disc space heights are well maintained. No lytic nor blastic lesions are demonstrated. The known abnormalities demonstrated on today's CT scan within the thoracic and lumbar vertebral bodies are below the level of sensitivity of the plain film technique. No abnormal paravertebral soft tissue densities are demonstrated.  The iliac and pubic bones are well mineralized. There is a 5 mm diameter lucency in the inferior pubic ramus on the right. There are known sub cm lytic lesions within  the medial aspects of both iliac bones as well as in the ischia bilaterally.  There is a well-circumscribed lesion at the base of the neck of the left femur. This measures a approximately 11 mm in diameter and was visible on today's CT scan. The remainder of the left femur appears normal. The right femur is normal in appearance.  The clavicles and humeri exhibit no lytic nor blastic lesions.  The chest film reveals the lungs to be adequately inflated. There is no focal infiltrate. The cardiopericardial silhouette is normal in size. No pleural effusion is demonstrated. The observed portions of the rib cage exhibit no acute abnormalities.  IMPRESSION: The patient has known lytic lesions throughout the spine as well as within the sternum and bony pelvis and proximal femurs based on the CT scan today. Most are inapparent on this metabolic survey series. Lesions in the bony pelvis and left femur are visible. The known thoracic and lumbar spine lesions are not clearly evident. The findings are worrisome for widespread metastatic disease or for myeloma.   Electronically Signed   By: David  Martinique   On: 01/28/2014 18:03   Ct Angio Chest Aortic Dissect W &/or W/o  01/28/2014   CLINICAL DATA:  Sudden onset of tearing abdominal pain. Possible aortic dissection.  EXAM: CT ANGIOGRAPHY CHEST, ABDOMEN AND PELVIS  TECHNIQUE: Multidetector CT imaging through the chest, abdomen and pelvis was performed using the standard protocol during bolus administration of intravenous contrast. Multiplanar reconstructed images and MIPs were obtained and reviewed to evaluate the vascular anatomy.  CONTRAST:  140m OMNIPAQUE IOHEXOL 350 MG/ML SOLN  COMPARISON:  CT of the abdomen and pelvis 02/21/2013.  FINDINGS: CTA CHEST FINDINGS  Mediastinum: No acute abnormality of the thoracic aorta; specifically, no aneurysm or dissection. Additionally on precontrast images there is no crescentic high attenuation associated with the wall of the thoracic  aorta to suggest acute intramural hemorrhage. Heart size is normal. There is no significant pericardial fluid, thickening or pericardial calcification. No pathologically enlarged mediastinal or hilar lymph nodes. Esophagus is unremarkable in appearance.  Lungs/Pleura: Assessment of the lung parenchyma is limited by considerable patient motion. With these limitations in mind there are no definite large suspicious appearing pulmonary nodules or masses identified. No acute consolidative airspace disease. No pleural effusions. Although the examination was not specifically tailored to evaluate for pulmonary embolism, opacification of the pulmonary arteries is  sufficient to is exclude the presence of clinically relevant central, lobar or segmental sized pulmonary emboli. Minimal subsegmental atelectasis in the lower lobes of the lungs bilaterally.  Musculoskeletal: Innumerable lucent lesions are seen scattered throughout the visualized axial and appendicular skeleton.  Review of the MIP images confirms the above findings.  CTA ABDOMEN AND PELVIS FINDINGS  Abdomen/Pelvis: No acute abnormality of the abdominal aorta or major abdominal and pelvic vasculature. Specifically, no evidence of acute intracranial hemorrhage, dissection or aneurysm. Celiac axis, superior mesenteric artery and inferior mesenteric artery is well as their major branches are all widely patent. Single renal arteries are noted bilaterally.  The appearance of the liver, gallbladder, pancreas, spleen, bilateral adrenal glands and bilateral kidneys is unremarkable. No significant volume of ascites. No pneumoperitoneum. No pathologic distention of small bowel. No definite pathologic will discretion no definite lymphadenopathy identified within the abdomen or pelvis.  Musculoskeletal: Innumerable small lytic lesions are again noted throughout the visualized axial and appendicular skeleton. Some of these appear similar to the prior study, while others are new  and some have clearly enlarged. The best example of an enlarging lesion is in the posteromedial aspect of the left femoral neck on image 308 of series 6 where there is a lesion that now demonstrates significant endosteal scalloping bordering on frank cortical destruction. No definite associated pathologic fractures are identified at this time.  Review of the MIP images confirms the above findings.  IMPRESSION: 1. No acute abnormality of the thoracic abdominal aorta. 2. Widespread lytic lesions throughout the visualized axial and appendicular skeleton, clearly progressed compared to the prior examination, highly concerning for systemic disease such as multiple myeloma. Alternatively, this could represent widespread metastatic disease from an unknown primary. Hematology referral for work up for potential multiple myeloma is strongly advised at this time. Should that workup reveal no evidence of multiple myeloma, further evaluation with PET-CT may be warranted to assess for potential occult primary malignancy. 3. No definite acute findings in the chest, abdomen or pelvis to account for the patient's symptoms.   Electronically Signed   By: Vinnie Langton M.D.   On: 01/28/2014 13:27   Ct Angio Abd/pel W/ And/or W/o  01/28/2014   CLINICAL DATA:  Sudden onset of tearing abdominal pain. Possible aortic dissection.  EXAM: CT ANGIOGRAPHY CHEST, ABDOMEN AND PELVIS  TECHNIQUE: Multidetector CT imaging through the chest, abdomen and pelvis was performed using the standard protocol during bolus administration of intravenous contrast. Multiplanar reconstructed images and MIPs were obtained and reviewed to evaluate the vascular anatomy.  CONTRAST:  166m OMNIPAQUE IOHEXOL 350 MG/ML SOLN  COMPARISON:  CT of the abdomen and pelvis 02/21/2013.  FINDINGS: CTA CHEST FINDINGS  Mediastinum: No acute abnormality of the thoracic aorta; specifically, no aneurysm or dissection. Additionally on precontrast images there is no crescentic  high attenuation associated with the wall of the thoracic aorta to suggest acute intramural hemorrhage. Heart size is normal. There is no significant pericardial fluid, thickening or pericardial calcification. No pathologically enlarged mediastinal or hilar lymph nodes. Esophagus is unremarkable in appearance.  Lungs/Pleura: Assessment of the lung parenchyma is limited by considerable patient motion. With these limitations in mind there are no definite large suspicious appearing pulmonary nodules or masses identified. No acute consolidative airspace disease. No pleural effusions. Although the examination was not specifically tailored to evaluate for pulmonary embolism, opacification of the pulmonary arteries is sufficient to is exclude the presence of clinically relevant central, lobar or segmental sized pulmonary emboli. Minimal subsegmental atelectasis in  the lower lobes of the lungs bilaterally.  Musculoskeletal: Innumerable lucent lesions are seen scattered throughout the visualized axial and appendicular skeleton.  Review of the MIP images confirms the above findings.  CTA ABDOMEN AND PELVIS FINDINGS  Abdomen/Pelvis: No acute abnormality of the abdominal aorta or major abdominal and pelvic vasculature. Specifically, no evidence of acute intracranial hemorrhage, dissection or aneurysm. Celiac axis, superior mesenteric artery and inferior mesenteric artery is well as their major branches are all widely patent. Single renal arteries are noted bilaterally.  The appearance of the liver, gallbladder, pancreas, spleen, bilateral adrenal glands and bilateral kidneys is unremarkable. No significant volume of ascites. No pneumoperitoneum. No pathologic distention of small bowel. No definite pathologic will discretion no definite lymphadenopathy identified within the abdomen or pelvis.  Musculoskeletal: Innumerable small lytic lesions are again noted throughout the visualized axial and appendicular skeleton. Some of these  appear similar to the prior study, while others are new and some have clearly enlarged. The best example of an enlarging lesion is in the posteromedial aspect of the left femoral neck on image 308 of series 6 where there is a lesion that now demonstrates significant endosteal scalloping bordering on frank cortical destruction. No definite associated pathologic fractures are identified at this time.  Review of the MIP images confirms the above findings.  IMPRESSION: 1. No acute abnormality of the thoracic abdominal aorta. 2. Widespread lytic lesions throughout the visualized axial and appendicular skeleton, clearly progressed compared to the prior examination, highly concerning for systemic disease such as multiple myeloma. Alternatively, this could represent widespread metastatic disease from an unknown primary. Hematology referral for work up for potential multiple myeloma is strongly advised at this time. Should that workup reveal no evidence of multiple myeloma, further evaluation with PET-CT may be warranted to assess for potential occult primary malignancy. 3. No definite acute findings in the chest, abdomen or pelvis to account for the patient's symptoms.   Electronically Signed   By: Vinnie Langton M.D.   On: 01/28/2014 13:27    Labs: Basic Metabolic Panel:  Recent Labs Lab 01/28/14 1229 01/28/14 1800 01/29/14 0240  NA 142  --  139  K 3.7  --  4.3  CL 99  --  102  CO2 25  --  27  GLUCOSE 112*  --  138*  BUN 12  --  9  CREATININE 1.39*  --  1.03  CALCIUM 9.9  --  8.2*  MG  --  2.1  --    Liver Function Tests:  Recent Labs Lab 01/28/14 1229 01/29/14 0240  AST 21 16  ALT 17 12  ALKPHOS 54 44  BILITOT 0.5 0.3  PROT 7.8 6.5  ALBUMIN 4.3 3.3*    Recent Labs Lab 01/28/14 1229  LIPASE 30   CBC:  Recent Labs Lab 01/28/14 1229 01/29/14 0240  WBC 6.6 5.0  HGB 17.4* 15.2  HCT 47.9 42.4  MCV 88.4 88.5  PLT 197 167   Cardiac Enzymes:  Recent Labs Lab 01/28/14 1800  01/29/14 0240 01/29/14 0840  TROPONINI <0.30 <0.30 <0.30     Signed:  Nyra Jabs, PA-C [phone] [facility] 01/31/2014, 1:15 PM   Attending Patient seen and examined, with the above assessment and plan. Abdominal pain has resolved, likely due to constipation. Outpatient colonoscopy planned with [facility] gastroenterology. He status post left iliac crest bone marrow biopsy, results will be followed by Dr. Marin Olp. [place] will call patient with appointment.I spoke and discussed case with  Dr Marin Olp on discharge.  Nena Alexander MD

## 2014-01-31 NOTE — Progress Notes (Signed)
Nsg Discharge Note  Admit Date:  01/28/2014 Discharge date: 01/31/2014   Rozell Searing Landa to be D/C'd Home per MD order.  AVS completed.  Copy for chart, and copy for patient signed, and dated. Patient/caregiver able to verbalize understanding.  Discharge Medication:   Medication List         amLODipine 5 MG tablet  Commonly known as:  NORVASC  Take 5 mg by mouth daily.     atorvastatin 20 MG tablet  Commonly known as:  LIPITOR  Take 1 tablet (20 mg total) by mouth daily.     polyethylene glycol packet  Commonly known as:  MIRALAX / GLYCOLAX  Take 17 g by mouth daily.     TYLENOL COLD PO  Take 2 tablets by mouth daily as needed (cold / congestion).        Discharge Assessment: Filed Vitals:   01/31/14 0953  BP: 132/80  Pulse:   Temp:   Resp:    Skin clean, dry and intact without evidence of skin break down, no evidence of skin tears noted. Dry area noted to armpit, may refer to doc flowsheet.  IV catheter discontinued intact. Site without signs and symptoms of complications - no redness or edema noted at insertion site, patient denies c/o pain - only slight tenderness at site.  Dressing with slight pressure applied.  D/c Instructions-Education: Discharge instructions given to patient/family with verbalized understanding. D/c education completed with patient/family including follow up instructions, medication list, d/c activities limitations if indicated, with other d/c instructions as indicated by MD - patient able to verbalize understanding, all questions fully answered. Patient instructed to return to ED, call 911, or call MD for any changes in condition.  Patient escorted via [place], and D/C home via private auto.  Dayle Points, RN 01/31/2014 11:51 AM

## 2014-01-31 NOTE — Telephone Encounter (Signed)
Left pt message to call, per MD we are waiting on test results

## 2014-01-31 NOTE — Progress Notes (Signed)
Daily Rounding Note  01/31/2014, 8:39 AM  LOS: 3 days   SUBJECTIVE:       Much better after first of 2 large BMs  OBJECTIVE:         Vital signs in last 24 hours:    Temp:  [97.5 F (36.4 C)-99 F (37.2 C)] 98.6 F (37 C) (03/11 0430) Pulse Rate:  [54-78] 59 (03/11 0430) Resp:  [9-20] 18 (03/11 0430) BP: (116-151)/(76-100) 130/80 mmHg (03/11 0430) SpO2:  [95 %-100 %] 97 % (03/11 0430) Last BM Date: 01/29/14 General: looks well   Heart: rrr Chest: clear bil Abdomen: soft, active BS, no tenderness, no distention  Extremities: no CCE Neuro/Psych:  Pleasant, in good spirits.   Intake/Output from previous day: 03/10 0701 - 03/11 0700 In: 841 [P.O.:841] Out: 610 [Urine:610]  Intake/Output this shift:    Lab Results:  Recent Labs  01/28/14 1229 01/29/14 0240  WBC 6.6 5.0  HGB 17.4* 15.2  HCT 47.9 42.4  PLT 197 167   BMET  Recent Labs  01/28/14 1229 01/29/14 0240  NA 142 139  K 3.7 4.3  CL 99 102  CO2 25 27  GLUCOSE 112* 138*  BUN 12 9  CREATININE 1.39* 1.03  CALCIUM 9.9 8.2*   LFT  Recent Labs  01/28/14 1229 01/29/14 0240  PROT 7.8 6.5  ALBUMIN 4.3 3.3*  AST 21 16  ALT 17 12  ALKPHOS 54 44  BILITOT 0.5 0.3   PT/INR  Recent Labs  01/29/14 0240  LABPROT 14.3  INR 1.13   Hepatitis Panel No results found for this basename: HEPBSAG, HCVAB, HEPAIGM, HEPBIGM,  in the last 72 hours  Studies/Results: Mr Thoracic Spine W Wo Contrast  01/29/2014   CLINICAL DATA:  Abdominal pain.  Lytic spine lesions on CT.  EXAM: MRI THORACIC AND LUMBAR SPINE WITHOUT AND WITH CONTRAST  TECHNIQUE: Multiplanar and multiecho pulse sequences of the thoracic and lumbar spine were obtained without and with intravenous contrast.  CONTRAST:  47m MULTIHANCE GADOBENATE DIMEGLUMINE 529 MG/ML IV SOLN  COMPARISON:  CT abdomen and pelvis 01/28/2014  FINDINGS: MR THORACIC SPINE FINDINGS  Numerous a small punched-out  lesions are present throughout the thoracic spine. These are actually better seen on CT than MRI. Some of them show mild enhancement. The largest lesion is on the left side of the T3 vertebral body. Negative for fracture. No epidural tumor or cord compression is identified.  Mild disc and facet degeneration at T9-10 causing mild spinal stenosis. There is mild foraminal narrowing bilaterally. Mild disc and facet degeneration also at T10-11 with mild spinal stenosis. Mild disc degeneration at T7-8 and T8-9.  Thoracic spinal cord signal is normal. Minimal pleural effusion bilaterally.  MR LUMBAR SPINE FINDINGS  Small lytic punched out lesions in the lumbar spine are best seen on the CT. Some of these lesions may show minimal enhancement. No fracture or bone marrow edema. Negative for epidural tumor or spinal stenosis. Conus medullaris is normal and terminates at L1-2.  L1-2:  Negative  L2-3:  Negative  L3-4:  Mild facet degeneration  L4-5: Left foraminal disc protrusion with impingement of the left L4 nerve root in the foramen. Mild spinal stenosis. Bilateral facet hypertrophy.  L5-S1:  Mild disc and facet degeneration.  IMPRESSION: Numerous small lytic lesions in the thoracic and lumbar spine, best seen on the recent CT. Pattern is highly suspicious for multiple myeloma.  Negative for fracture.  No epidural tumor  or cord compression  Left foraminal disc protrusion L4-5 with left L4 nerve root impingement.   Electronically Signed   By: Franchot Gallo M.D.   On: 01/29/2014 18:50   Mr Lumbar Spine W Wo Contrast  01/29/2014   CLINICAL DATA:  Abdominal pain.  Lytic spine lesions on CT.  EXAM: MRI THORACIC AND LUMBAR SPINE WITHOUT AND WITH CONTRAST  TECHNIQUE: Multiplanar and multiecho pulse sequences of the thoracic and lumbar spine were obtained without and with intravenous contrast.  CONTRAST:  68m MULTIHANCE GADOBENATE DIMEGLUMINE 529 MG/ML IV SOLN  COMPARISON:  CT abdomen and pelvis 01/28/2014  FINDINGS: MR THORACIC  SPINE FINDINGS  Numerous a small punched-out lesions are present throughout the thoracic spine. These are actually better seen on CT than MRI. Some of them show mild enhancement. The largest lesion is on the left side of the T3 vertebral body. Negative for fracture. No epidural tumor or cord compression is identified.  Mild disc and facet degeneration at T9-10 causing mild spinal stenosis. There is mild foraminal narrowing bilaterally. Mild disc and facet degeneration also at T10-11 with mild spinal stenosis. Mild disc degeneration at T7-8 and T8-9.  Thoracic spinal cord signal is normal. Minimal pleural effusion bilaterally.  MR LUMBAR SPINE FINDINGS  Small lytic punched out lesions in the lumbar spine are best seen on the CT. Some of these lesions may show minimal enhancement. No fracture or bone marrow edema. Negative for epidural tumor or spinal stenosis. Conus medullaris is normal and terminates at L1-2.  L1-2:  Negative  L2-3:  Negative  L3-4:  Mild facet degeneration  L4-5: Left foraminal disc protrusion with impingement of the left L4 nerve root in the foramen. Mild spinal stenosis. Bilateral facet hypertrophy.  L5-S1:  Mild disc and facet degeneration.  IMPRESSION: Numerous small lytic lesions in the thoracic and lumbar spine, best seen on the recent CT. Pattern is highly suspicious for multiple myeloma.  Negative for fracture.  No epidural tumor or cord compression  Left foraminal disc protrusion L4-5 with left L4 nerve root impingement.   Electronically Signed   By: CFranchot GalloM.D.   On: 01/29/2014 18:50   Ct Biopsy  01/30/2014   CLINICAL DATA 53year old with abdominal pain and multiple lucent lesions throughout the skeleton.  EXAM CT GUIDED BONE MARROW ASPIRATES AND BIOPSY  Physician: AStephan Minister HAnselm Pancoast MD  MEDICATIONS 4 mg versed, 100 mcg fentanyl. A radiology nurse monitored the patient for moderate sedation.  ANESTHESIA/SEDATION Sedation time: 20  PROCEDURE The procedure was explained to the  patient. The risks and benefits of the procedure were discussed and the patient's questions were addressed. Informed consent was obtained from the patient. The patient was placed prone on CT scan. Images of the pelvis were obtained. The left side of back was prepped and draped in sterile fashion. The skin and left posterior iliac bone were anesthetized with 1% lidocaine. 11 gauge bone needle was directed into the left iliac bone with CT guidance. Two aspirates and 2 core biopsies performed. One suitable core biopsy was obtained.  FINDINGS The bone needle was placed in the posterior left iliac bone. The bone needle was directed towards multiple small lucent lesions. Trace amount of fluid in the right lower quadrant of the abdomen.  COMPLICATIONS None  IMPRESSION CT guided bone marrow aspirates and core biopsy. The core biopsy was directed through small lucent lesions.  Trace amount of fluid in the abdomen.  SIGNATURE  Electronically Signed   By: AMarkus Daft  M.D.   On: 01/30/2014 13:22    ASSESMENT:   *  Abdominal pain, constipation.  Better after Miralax, Senolot, Bentyl   *  Skeletal lytic lesions.  S/p bone marrow biopsy 3/10.   *  ARI, resolved.     PLAN   *  Has RN preop visit and outpt colonoscopy scheduled see the discharge appt schedule    Azucena Freed  01/31/2014, 8:39 AM Pager: [phone] Attending MD note:   I have taken a history, examined the patient, and reviewed the chart. I agree with the Advanced Practitioner's impression and recommendations. Much improved after Mag citrate last night. Will have an outpatient colonoscopy, never had one.  [facility] Pager # [phone]

## 2014-01-31 NOTE — Care Management Note (Signed)
    Page 1 of 1   01/31/2014     2:52:56 PM   CARE MANAGEMENT NOTE 01/31/2014  Patient:  Jimmy Day, Jimmy Day   Account Number:  [id number]  Date Initiated:  01/31/2014  Documentation initiated by:  Tomi Bamberger  Subjective/Objective Assessment:   dx letic bone lesion, abd pain  admit- lives with spouse.     Action/Plan:   Anticipated DC Date:  01/31/2014   Anticipated DC Plan:  HOME/SELF CARE      DC Planning Services  CM consult      Choice offered to / List presented to:             Status of service:  Completed, signed off Medicare Important Message given?   (If response is "NO", the following Medicare IM given date fields will be blank) Date Medicare IM given:   Date Additional Medicare IM given:    Discharge Disposition:  HOME/SELF CARE  Per UR Regulation:  Reviewed for med. necessity/level of care/duration of stay  If discussed at [place] of Stay Meetings, dates discussed:    Comments:

## 2014-02-02 ENCOUNTER — Ambulatory Visit (HOSPITAL_BASED_OUTPATIENT_CLINIC_OR_DEPARTMENT_OTHER): Payer: BC Managed Care – PPO | Admitting: Lab

## 2014-02-02 ENCOUNTER — Telehealth: Payer: Self-pay | Admitting: Hematology & Oncology

## 2014-02-02 ENCOUNTER — Ambulatory Visit (HOSPITAL_BASED_OUTPATIENT_CLINIC_OR_DEPARTMENT_OTHER): Payer: BC Managed Care – PPO | Admitting: Hematology & Oncology

## 2014-02-02 ENCOUNTER — Encounter: Payer: Self-pay | Admitting: Hematology & Oncology

## 2014-02-02 ENCOUNTER — Ambulatory Visit: Payer: BC Managed Care – PPO

## 2014-02-02 VITALS — BP 134/79 | HR 77 | Temp 97.8°F | Resp 18 | Ht 71.0 in | Wt 198.0 lb

## 2014-02-02 DIAGNOSIS — N289 Disorder of kidney and ureter, unspecified: Secondary | ICD-10-CM

## 2014-02-02 DIAGNOSIS — C9000 Multiple myeloma not having achieved remission: Secondary | ICD-10-CM

## 2014-02-02 HISTORY — DX: Multiple myeloma not having achieved remission: C90.00

## 2014-02-02 LAB — CBC WITH DIFFERENTIAL (CANCER CENTER ONLY)
BASO#: 0 10*3/uL (ref 0.0–0.2)
BASO%: 0.3 % (ref 0.0–2.0)
EOS%: 1.6 % (ref 0.0–7.0)
Eosinophils Absolute: 0.1 10*3/uL (ref 0.0–0.5)
HEMATOCRIT: 40.3 % (ref 38.7–49.9)
HGB: 14.2 g/dL (ref 13.0–17.1)
LYMPH#: 1.9 10*3/uL (ref 0.9–3.3)
LYMPH%: 32.9 % (ref 14.0–48.0)
MCH: 30.6 pg (ref 28.0–33.4)
MCHC: 35.2 g/dL (ref 32.0–35.9)
MCV: 87 fL (ref 82–98)
MONO#: 0.5 10*3/uL (ref 0.1–0.9)
MONO%: 8.7 % (ref 0.0–13.0)
NEUT#: 3.2 10*3/uL (ref 1.5–6.5)
NEUT%: 56.5 % (ref 40.0–80.0)
Platelets: 237 10*3/uL (ref 145–400)
RBC: 4.64 10*6/uL (ref 4.20–5.70)
RDW: 12.6 % (ref 11.1–15.7)
WBC: 5.7 10*3/uL (ref 4.0–10.0)

## 2014-02-02 LAB — COMPREHENSIVE METABOLIC PANEL
ALBUMIN: 4 g/dL (ref 3.5–5.2)
ALT: 15 U/L (ref 0–53)
AST: 16 U/L (ref 0–37)
Alkaline Phosphatase: 47 U/L (ref 39–117)
BUN: 12 mg/dL (ref 6–23)
CO2: 27 meq/L (ref 19–32)
CREATININE: 1.22 mg/dL (ref 0.50–1.35)
Calcium: 9.2 mg/dL (ref 8.4–10.5)
Chloride: 103 mEq/L (ref 96–112)
Glucose, Bld: 92 mg/dL (ref 70–99)
Potassium: 3.8 mEq/L (ref 3.5–5.3)
Sodium: 137 mEq/L (ref 135–145)
Total Bilirubin: 0.4 mg/dL (ref 0.2–1.2)
Total Protein: 6.7 g/dL (ref 6.0–8.3)

## 2014-02-02 LAB — CHCC SATELLITE - SMEAR

## 2014-02-02 MED ORDER — FAMCICLOVIR 250 MG PO TABS: ORAL_TABLET | ORAL | Status: DC

## 2014-02-02 MED ORDER — LENALIDOMIDE 25 MG PO CAPS: 25.0000 mg | ORAL_CAPSULE | Freq: Every day | ORAL | Status: DC

## 2014-02-02 NOTE — Progress Notes (Signed)
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 NEW PATIENT  : I just got out of the hospital.  HPI: Jimmy Day is a very nice 53 year old African American gentleman. He was recently hospitalized because of abdominal pain. With the workup, he was found to have numerous lytic bone lesions. A bone survey and MRI confirmed this. He had lab work done. An SPEP did not show a monoclonal spike. he did have an increased kappa light chains in his serum. Unfortunately, the the wrong 24-hour urine was done and not a UIF.  A bone marrow was done. This was done on March 10. The bone marrow report ( [id number]) showed a normocellular marrow with 9% plasma cells. However, the plasma cells were atypical. There is clustering.  We had him come to the office so that we could finish out his workup and then begin treatment as I feel that he qualifies for him light chain myeloma.  He has excellent performance status. He is working. He really has no bone pain. He's had no nausea vomiting. There's no change in bowel bladder habits. He is due for a colonoscopy later this month. He's had no fever. He's had no recurrent infections.  Is no weight loss or weight gain. He has had no headache.  Overall, his performance status is ECOG 0.          Past Medical History  Diagnosis Date  . Fasting hyperglycemia     NORMAL A1c  . Hyperlipidemia   . Testosterone deficiency     Dr Hartley Barefoot  . Hypertension   :  Past Surgical History  Procedure Laterality Date  . Lipomas removal      BENIGN FROM CHEST AND BACK  . Cervical plate insertion      POST COMPRESSION DISC INJURY  :  Current outpatient prescriptions:amLODipine (NORVASC) 5 MG tablet, Take 5 mg by mouth daily., Disp: , Rfl: ;  aspirin 81 MG tablet, Take 81 mg by mouth daily., Disp: , Rfl: ;  atorvastatin (LIPITOR) 20 MG tablet, Take 1 tablet (20 mg total) by mouth daily.,  Disp: 90 tablet, Rfl: 3;  polyethylene glycol (MIRALAX / GLYCOLAX) packet, Take 17 g by mouth daily., Disp: 30 each, Rfl: 6 Pseudoeph-CPM-DM-APAP (TYLENOL COLD PO), Take 2 tablets by mouth daily as needed (cold / congestion)., Disp: , Rfl: ;  famciclovir (FAMVIR) 250 MG tablet, Take 1 tablet a day, Disp: 30 tablet, Rfl: 8;  lenalidomide (REVLIMID) 25 MG capsule, Take 1 capsule (25 mg total) by mouth daily., Disp: 21 capsule, Rfl: 6:  :  No Known Allergies:  Family History  Problem Relation Age of Onset  . Breast cancer Mother   . Heart attack Father 68  . Kidney disease Brother     RENAL FAILURE  . Hypertension Brother   . Heart attack Maternal Aunt 65    CABG  . Stroke Maternal Uncle     CVA  . Diabetes Neg Hx   :  History   Social History  . Marital Status: Married    Spouse Name: N/A    Number of Children: 3  . Years of Education: N/A   Occupational History  . Truck Geophysicist/field seismologist    Social History Main Topics  . Smoking status: Never Smoker   . Smokeless tobacco: Never Used     Comment: never used tobacco  . Alcohol Use: Yes     Comment:  rarely  . Drug Use: No  . Sexual Activity: Yes    Birth Control/ Protection: Condom   Other Topics Concern  . Not on file   Social History Narrative  . No narrative on file  :  Pertinent items are noted in HPI.  Exam: @IPVITALS @  well-developed well-nourished gentleman. Vital signs are stable. Blood pressure is 134/79. Weight 198 pounds. Temperature 97.8. Pulse 77. Lungs are clear. Cardiac exam regular in rhythm. Abdomen soft. No palpable liver or spleen tip. Head and neck exam shows no adenopathy in the neck. Back exam no tenderness over the spine ribs or hips. Extremities no clubbing cyanosis or edema. Has good strength in his arms and legs. He has good range of motion of his joints. Skin exam no rashes ecchymoses or petechia. Neurological exam no focal neurological deficits.   Recent Labs  02/02/14 1523  WBC 5.7  HGB 14.2   HCT 40.3  PLT 237   No results found for this basename: NA, K, CL, CO2, GLUCOSE, BUN, CREATININE, CALCIUM,  in the last 72 hours  Blood smear review: No rouleau formation. No target cells. No nucleated red blood cells. White cell show no plasma cells. No hypersegmented polys. No immature myeloid or lymphoid cells. Platelets are adequate number size.  Pathology: None     Assessment and Plan: Jimmy Day is a 53 year old African American gentleman. He has Kappa Light chain myeloma. The bone lesions qualify him for this. I think the 24 hour urine will show where his light chains are. We will see what his LDHs and his beta-2 microglobulin.  1 important piece of information missing is the cytogenetics and FISH studies on the bone marrow. I will hopefully get these back next week.  I think that he would be a good candidate for induction therapy with Revlimid/Velcade/Decadron. He has a great performance status. Hopefully, I think he will be a stem cell transplant candidate. I think with light chain myeloma, it is more aggressive and since he is in great shape, we can be aggressive. I will order a PET scan on him. I think this will help.  I spent an hour and half with the and his son. I explained what was going on. His wife was loosening on the telephone at work. She works at [employer]. I answered all their questions. I think the prognosis should be very good. Again, the cytogenetics will help with this.  He will need Zometa.  I did put him on Famvir. I also made sure that he increase his aspirin to 325 mg a day.  We will likely start therapy on him in a couple weeks.  Apparently, lytic lesions were found a year ago but because of insurance issues, he cannot have additional tests.  I will plan to see him back in a few weeks.

## 2014-02-02 NOTE — Telephone Encounter (Signed)
Pt aware of 3-13 appointment

## 2014-02-05 ENCOUNTER — Other Ambulatory Visit: Payer: Self-pay | Admitting: Lab

## 2014-02-05 ENCOUNTER — Telehealth: Payer: Self-pay | Admitting: Hematology & Oncology

## 2014-02-05 DIAGNOSIS — N289 Disorder of kidney and ureter, unspecified: Secondary | ICD-10-CM

## 2014-02-05 DIAGNOSIS — C9000 Multiple myeloma not having achieved remission: Secondary | ICD-10-CM

## 2014-02-05 NOTE — Telephone Encounter (Signed)
Pt aware of 3-18 edu, 3-23 PET and to be NPO 6 hrs prior. He is also aware of 3-25 tx

## 2014-02-06 LAB — CHROMOSOME ANALYSIS, BONE MARROW

## 2014-02-07 ENCOUNTER — Other Ambulatory Visit: Payer: BC Managed Care – PPO

## 2014-02-07 ENCOUNTER — Ambulatory Visit (INDEPENDENT_AMBULATORY_CARE_PROVIDER_SITE_OTHER): Payer: BC Managed Care – PPO | Admitting: Internal Medicine

## 2014-02-07 ENCOUNTER — Encounter: Payer: Self-pay | Admitting: *Deleted

## 2014-02-07 ENCOUNTER — Telehealth: Payer: Self-pay | Admitting: Internal Medicine

## 2014-02-07 ENCOUNTER — Encounter: Payer: Self-pay | Admitting: Internal Medicine

## 2014-02-07 VITALS — BP 120/70 | HR 69 | Temp 98.3°F | Resp 13 | Wt 199.8 lb

## 2014-02-07 DIAGNOSIS — D759 Disease of blood and blood-forming organs, unspecified: Secondary | ICD-10-CM

## 2014-02-07 DIAGNOSIS — R059 Cough, unspecified: Secondary | ICD-10-CM | POA: Insufficient documentation

## 2014-02-07 DIAGNOSIS — R05 Cough: Secondary | ICD-10-CM

## 2014-02-07 DIAGNOSIS — R898 Other abnormal findings in specimens from other organs, systems and tissues: Secondary | ICD-10-CM

## 2014-02-07 DIAGNOSIS — I10 Essential (primary) hypertension: Secondary | ICD-10-CM

## 2014-02-07 DIAGNOSIS — C9000 Multiple myeloma not having achieved remission: Secondary | ICD-10-CM

## 2014-02-07 LAB — UIFE/LIGHT CHAINS/TP QN, 24-HR UR
ALPHA 2 UR: DETECTED — AB
Albumin, U: DETECTED
Alpha 1, Urine: DETECTED — AB
Beta, Urine: DETECTED — AB
FREE KAPPA LT CHAINS, UR: 10.3 mg/dL — AB (ref 0.14–2.42)
FREE KAPPA/LAMBDA RATIO: 57.22 ratio — AB (ref 2.04–10.37)
Free Lambda Excretion/Day: 3.06 mg/d
Free Lambda Lt Chains,Ur: 0.18 mg/dL (ref 0.02–0.67)
Free Lt Chn Excr Rate: 175.1 mg/d
Gamma Globulin, Urine: DETECTED — AB
Time: 24 hours
Total Protein, Urine-Ur/day: 187 mg/d — ABNORMAL HIGH (ref 10–140)
Total Protein, Urine: 11 mg/dL
Volume, Urine: 1700 mL

## 2014-02-07 LAB — PROTEIN ELECTROPHORESIS, SERUM, WITH REFLEX
ALBUMIN ELP: 59.6 % (ref 55.8–66.1)
ALPHA-1-GLOBULIN: 5 % — AB (ref 2.9–4.9)
ALPHA-2-GLOBULIN: 9.6 % (ref 7.1–11.8)
BETA GLOBULIN: 4.3 % — AB (ref 4.7–7.2)
Beta 2: 3.9 % (ref 3.2–6.5)
Gamma Globulin: 17.6 % (ref 11.1–18.8)
TOTAL PROTEIN, SERUM ELECTROPHOR: 6.6 g/dL (ref 6.0–8.3)

## 2014-02-07 LAB — BETA 2 MICROGLOBULIN, SERUM: BETA 2 MICROGLOBULIN: 1.06 mg/L (ref <=2.51)

## 2014-02-07 LAB — IGG, IGA, IGM
IGM, SERUM: 33 mg/dL — AB (ref 41–251)
IgA: 9 mg/dL — ABNORMAL LOW (ref 68–379)
IgG (Immunoglobin G), Serum: 1250 mg/dL (ref 650–1600)

## 2014-02-07 LAB — KAPPA/LAMBDA LIGHT CHAINS
KAPPA FREE LGHT CHN: 8.02 mg/dL — AB (ref 0.33–1.94)
Kappa:Lambda Ratio: 4.11 — ABNORMAL HIGH (ref 0.26–1.65)
Lambda Free Lght Chn: 1.95 mg/dL (ref 0.57–2.63)

## 2014-02-07 LAB — LACTATE DEHYDROGENASE: LDH: 151 U/L (ref 94–250)

## 2014-02-07 LAB — IFE INTERPRETATION

## 2014-02-07 NOTE — Assessment & Plan Note (Signed)
Controlled;BMET now WNL

## 2014-02-07 NOTE — Telephone Encounter (Signed)
Relevant patient education assigned to patient using Emmi. ° °

## 2014-02-07 NOTE — Progress Notes (Addendum)
Pre visit review using our clinic review tool, if applicable. No additional management support is needed unless otherwise documented below in the visit note. 

## 2014-02-07 NOTE — Progress Notes (Signed)
   Subjective:    Patient ID: Jimmy Day, male    DOB: 07/23/1961, 53 y.o.   MRN: [id number]  HPI Presents s/p hospital discharge on 3/11, where he was seen and treated for constipation.  Reports those symptoms have resolved with Miralax.  Hx significant for multiple myeloma. Chemotherapy planned; Dr. Marin Olp at [place].  Chemo treatments begin next week.  Denies fever, chills, n/v.   Acute symptoms present today are limited to a cough, which he has had for last 5 days.  This is the context of a resolving cold from 01/27/14, where he had green/yellow secretions.  Took OTC medications for symptoms. Reports cough is now triggered by food intake and is now white/clear sputum production.    Review of Systems Pertinent Negatives:  Denies fever, chills, n/v.  Denies black, tarry stools, melena, abdominal pain.  Denies frontal HA, teeth pain, nasal secretions, purulent discharge.  Denies dyspepsia, dysphagia.     Objective:   Physical Exam        Assessment & Plan:

## 2014-02-07 NOTE — Assessment & Plan Note (Signed)
Outpatient followup by Dr Marin Olp for  kappa light chain myeloma

## 2014-02-07 NOTE — Progress Notes (Signed)
   Subjective:    Patient ID: Jimmy Day, male    DOB: May 04, 1961, 53 y.o.   MRN: [id number]  HPI  Hospital records reviewed; diffuse lytic lesions of the axial and appendicular skeleton. Probable kappa  light chain myeloma based on bone marrow biopsy. Kappa free light chains were elevated at 8.02( normal less than 1.94). No associated anemia present  Hospital course concaved by prerenal azotemia (resolved)and abdominal discomfort and constipation. The latter responded to stool softeners and MiraLax.   Review of Systems Denies fever, chills, sweats, nausea, vomiting.    The cough is worse after eating. It is not associated with signs of rhinosinusitis. He specifically denies purulent nasal secretions or purulence sputum. He has some upper airway congestion.No ACE-I therapy  The abdominal discomfort and constipation have resolved.    Objective:   Physical Exam General appearance is one of good health and nourishment w/o distress.  Eyes: No conjunctival inflammation or scleral icterus is present.  Oral exam: Dental hygiene is good; lips and gums are healthy appearing.There is no oropharyngeal erythema or exudate noted.   Heart:  Normal rate and regular rhythm. S1 and S2 normal without gallop, murmur, click, rub or other extra sounds     Lungs:Chest clear to auscultation; no wheezes, rhonchi,rales ,or rubs present.No increased work of breathing.   Abdomen: bowel sounds normal, soft and non-tender without masses, organomegaly or hernias noted.  No guarding or rebound . No tenderness over the flanks to percussion  Musculoskeletal: Able to lie flat and sit up without help. Negative straight leg raising bilaterally. Gait normal  Skin:Warm & dry.  Intact without suspicious lesions or rashes ; no jaundice or tenting  Lymphatic: No lymphadenopathy is noted about the head, neck, axilla areas.                Assessment & Plan:  See Current Assessment & Plan in Problem List  under specific Diagnosis

## 2014-02-07 NOTE — Assessment & Plan Note (Signed)
Nasal hygiene

## 2014-02-07 NOTE — Patient Instructions (Addendum)
Plain Mucinex (NOT D) for thick secretions ;force NON dairy fluids .   Nasal cleansing in the shower as discussed with lather of mild shampoo.After 10 seconds wash off lather while  exhaling through nostrils. Make sure that all residual soap is removed to prevent irritation.  Flonase OR Nasacort AQ 1 spray in each nostril twice a day as needed. Use the "crossover" technique into opposite nostril spraying toward opposite ear @ 45 degree angle, not straight up into nostril.  Use a Neti pot daily only  as needed for significant sinus congestion; going from open side to congested side . Plain Allegra (NOT D )  160 daily , Loratidine 10 mg , OR Zyrtec 10 mg @ bedtime  as needed for itchy eyes & sneezing. Increase roughage (fruits and vegetables) and your diet as recommended. Drink to thirst up to 40 ounces of water a day. Metamucil or other such agents may help the constipation. Take MiraLax every third night as needed for constipation.

## 2014-02-12 ENCOUNTER — Ambulatory Visit (HOSPITAL_COMMUNITY)
Admission: RE | Admit: 2014-02-12 | Discharge: 2014-02-12 | Disposition: A | Discharge status: Home / Self Care | Payer: BC Managed Care – PPO | Source: Ambulatory Visit | Attending: Hematology & Oncology | Admitting: Hematology & Oncology

## 2014-02-12 DIAGNOSIS — N289 Disorder of kidney and ureter, unspecified: Secondary | ICD-10-CM

## 2014-02-12 DIAGNOSIS — C9000 Multiple myeloma not having achieved remission: Secondary | ICD-10-CM | POA: Insufficient documentation

## 2014-02-12 LAB — GLUCOSE, CAPILLARY: GLUCOSE-CAPILLARY: 89 mg/dL (ref 70–99)

## 2014-02-12 MED ORDER — FLUDEOXYGLUCOSE F - 18 (FDG) INJECTION
10.1000 | Freq: Once | INTRAVENOUS | Status: AC | PRN
  Administered 2014-02-12: 10.1 via INTRAVENOUS

## 2014-02-13 ENCOUNTER — Telehealth: Payer: Self-pay | Admitting: *Deleted

## 2014-02-13 ENCOUNTER — Ambulatory Visit (AMBULATORY_SURGERY_CENTER): Payer: Self-pay | Admitting: *Deleted

## 2014-02-13 VITALS — Ht 71.0 in | Wt 201.0 lb

## 2014-02-13 DIAGNOSIS — Z1211 Encounter for screening for malignant neoplasm of colon: Secondary | ICD-10-CM

## 2014-02-13 MED ORDER — PEG-KCL-NACL-NASULF-NA ASC-C 100 G PO SOLR: ORAL | Status: DC

## 2014-02-13 NOTE — Telephone Encounter (Signed)
Please reschedule till after the chemotherapy has been completed in 10 weeks.

## 2014-02-13 NOTE — Telephone Encounter (Signed)
I spoke with pt about this and he is agreeable.  He states he starts his chemo next week and will get his schedule about when he will be finished.  He will call office back to reschedule colonoscopy after chemo is completed

## 2014-02-13 NOTE — Telephone Encounter (Signed)
Dr. Olevia Perches, This pt is scheduled for a screening colonoscopy on 02-22-14.  He had his PV today.  He was just recently diagnosed with myeloma and begins his weekly chemo treatment (for 10 weeks) next week.  Do you want to proceed with his colonoscopy as scheduled?  Jimmy Day

## 2014-02-13 NOTE — Progress Notes (Signed)
Pt was recently diagnosed with myeloma and begins his weekly chemo treatment (for 10 weeks) next week.  Note sent to make Dr. Olevia Perches to make sure pt is ok to proceed with colon as scheduled.  No allergy to eggs or soy products  No sleep apnea or oxygen use

## 2014-02-14 ENCOUNTER — Ambulatory Visit (HOSPITAL_BASED_OUTPATIENT_CLINIC_OR_DEPARTMENT_OTHER): Payer: BC Managed Care – PPO

## 2014-02-14 ENCOUNTER — Encounter: Payer: Self-pay | Admitting: Hematology & Oncology

## 2014-02-14 VITALS — BP 118/72 | HR 70 | Temp 98.0°F | Resp 12

## 2014-02-14 DIAGNOSIS — Z5112 Encounter for antineoplastic immunotherapy: Secondary | ICD-10-CM

## 2014-02-14 DIAGNOSIS — C9000 Multiple myeloma not having achieved remission: Secondary | ICD-10-CM

## 2014-02-14 MED ORDER — ONDANSETRON HCL 8 MG PO TABS
8.0000 mg | ORAL_TABLET | Freq: Once | ORAL | Status: AC
  Administered 2014-02-14: 8 mg via ORAL

## 2014-02-14 MED ORDER — SODIUM CHLORIDE 0.9 % IV SOLN
Freq: Once | INTRAVENOUS | Status: AC
  Administered 2014-02-14: 11:00:00 via INTRAVENOUS

## 2014-02-14 MED ORDER — BORTEZOMIB CHEMO SQ INJECTION 3.5 MG (2.5MG/ML)
1.3000 mg/m2 | Freq: Once | INTRAMUSCULAR | Status: AC
  Administered 2014-02-14: 2.75 mg via SUBCUTANEOUS
  Filled 2014-02-14: qty 2.75

## 2014-02-14 MED ORDER — ONDANSETRON HCL 8 MG PO TABS
ORAL_TABLET | ORAL | Status: AC
  Filled 2014-02-14: qty 1

## 2014-02-14 MED ORDER — LORAZEPAM 0.5 MG PO TABS: 0.5000 mg | ORAL_TABLET | Freq: Four times a day (QID) | ORAL | Status: DC | PRN

## 2014-02-14 MED ORDER — ONDANSETRON HCL 8 MG PO TABS: 8.0000 mg | ORAL_TABLET | Freq: Two times a day (BID) | ORAL | Status: DC

## 2014-02-14 MED ORDER — DEXAMETHASONE 4 MG PO TABS
ORAL_TABLET | ORAL | Status: AC
  Filled 2014-02-14: qty 5

## 2014-02-14 MED ORDER — DEXAMETHASONE 4 MG PO TABS
20.0000 mg | ORAL_TABLET | Freq: Once | ORAL | Status: AC
  Administered 2014-02-14: 20 mg via ORAL

## 2014-02-14 MED ORDER — ZOLEDRONIC ACID 4 MG/100ML IV SOLN
4.0000 mg | Freq: Once | INTRAVENOUS | Status: AC
  Administered 2014-02-14: 4 mg via INTRAVENOUS
  Filled 2014-02-14: qty 100

## 2014-02-14 MED ORDER — PROCHLORPERAZINE MALEATE 10 MG PO TABS: 10.0000 mg | ORAL_TABLET | Freq: Four times a day (QID) | ORAL | Status: DC | PRN

## 2014-02-14 NOTE — Patient Instructions (Signed)
Bortezomib injection What is this medicine? BORTEZOMIB (bor TEZ oh mib) is a chemotherapy drug. It slows the growth of cancer cells. This medicine is used to treat multiple myeloma, lymphoma, and other cancers. This medicine may be used for other purposes; ask your health care provider or pharmacist if you have questions. COMMON BRAND NAME(S): Velcade What should I tell my health care provider before I take this medicine? They need to know if you have any of these conditions: -heart disease -irregular heartbeat -liver disease -low blood counts, like low white blood cells, platelets, or hemoglobin -peripheral neuropathy -taking medicine for blood pressure -an unusual or allergic reaction to bortezomib, mannitol, boron, other medicines, foods, dyes, or preservatives -pregnant or trying to get pregnant -breast-feeding How should I use this medicine? This medicine is for injection into a vein or for injection under the skin. It is given by a health care professional in a hospital or clinic setting. Talk to your pediatrician regarding the use of this medicine in children. Special care may be needed. Overdosage: If you think you have taken too much of this medicine contact a poison control center or emergency room at once. NOTE: This medicine is only for you. Do not share this medicine with others. What if I miss a dose? It is important not to miss your dose. Call your doctor or health care professional if you are unable to keep an appointment. What may interact with this medicine? -medicines for diabetes -medicines to increase blood counts like filgrastim, pegfilgrastim, sargramostim -zalcitabine Talk to your doctor or health care professional before taking any of these medicines: -acetaminophen -aspirin -ibuprofen -ketoprofen -naproxen This list may not describe all possible interactions. Give your health care provider a list of all the medicines, herbs, non-prescription drugs, or  dietary supplements you use. Also tell them if you smoke, drink alcohol, or use illegal drugs. Some items may interact with your medicine. What should I watch for while using this medicine? Visit your doctor for checks on your progress. This drug may make you feel generally unwell. This is not uncommon, as chemotherapy can affect healthy cells as well as cancer cells. Report any side effects. Continue your course of treatment even though you feel ill unless your doctor tells you to stop. You may get drowsy or dizzy. Do not drive, use machinery, or do anything that needs mental alertness until you know how this medicine affects you. Do not stand or sit up quickly, especially if you are an older patient. This reduces the risk of dizzy or fainting spells. In some cases, you may be given additional medicines to help with side effects. Follow all directions for their use. Call your doctor or health care professional for advice if you get a fever, chills or sore throat, or other symptoms of a cold or flu. Do not treat yourself. This drug decreases your body's ability to fight infections. Try to avoid being around people who are sick. This medicine may increase your risk to bruise or bleed. Call your doctor or health care professional if you notice any unusual bleeding. Be careful brushing and flossing your teeth or using a toothpick because you may get an infection or bleed more easily. If you have any dental work done, tell your dentist you are receiving this medicine. Avoid taking products that contain aspirin, acetaminophen, ibuprofen, naproxen, or ketoprofen unless instructed by your doctor. These medicines may hide a fever. Do not become pregnant while taking this medicine. Women should inform their doctor   if they wish to become pregnant or think they might be pregnant. There is a potential for serious side effects to an unborn child. Talk to your health care professional or pharmacist for more information.  Do not breast-feed an infant while taking this medicine. You may have vomiting or diarrhea while taking this medicine. Drink water or other fluids as directed. What side effects may I notice from receiving this medicine? Side effects that you should report to your doctor or health care professional as soon as possible: -allergic reactions like skin rash, itching or hives, swelling of the face, lips, or tongue -breathing problems -changes in hearing -changes in vision -fast, irregular heartbeat -feeling faint or lightheaded, falls -pain, tingling, numbness in the hands or feet -seizures -swelling of the ankles, feet, hands -unusual bleeding or bruising -unusually weak or tired -vomiting Side effects that usually do not require medical attention (report to your doctor or health care professional if they continue or are bothersome): -changes in emotions or moods -constipation -diarrhea -loss of appetite -headache -irritation at site where injected -nausea This list may not describe all possible side effects. Call your doctor for medical advice about side effects. You may report side effects to FDA at 1-800-FDA-1088. Where should I keep my medicine? This drug is given in a hospital or clinic and will not be stored at home. NOTE: This sheet is a summary. It may not cover all possible information. If you have questions about this medicine, talk to your doctor, pharmacist, or health care provider.  2014, Elsevier/Gold Standard. (2010-12-17 11:42:36) Zoledronic Acid injection (Hypercalcemia, Oncology) What is this medicine? ZOLEDRONIC ACID (ZOE le dron ik AS id) lowers the amount of calcium loss from bone. It is used to treat too much calcium in your blood from cancer. It is also used to prevent complications of cancer that has spread to the bone. This medicine may be used for other purposes; ask your health care provider or pharmacist if you have questions. COMMON BRAND NAME(S):  Zometa What should I tell my health care provider before I take this medicine? They need to know if you have any of these conditions: -aspirin-sensitive asthma -cancer, especially if you are receiving medicines used to treat cancer -dental disease or wear dentures -infection -kidney disease -receiving corticosteroids like dexamethasone or prednisone -an unusual or allergic reaction to zoledronic acid, other medicines, foods, dyes, or preservatives -pregnant or trying to get pregnant -breast-feeding How should I use this medicine? This medicine is for infusion into a vein. It is given by a health care professional in a hospital or clinic setting. Talk to your pediatrician regarding the use of this medicine in children. Special care may be needed. Overdosage: If you think you have taken too much of this medicine contact a poison control center or emergency room at once. NOTE: This medicine is only for you. Do not share this medicine with others. What if I miss a dose? It is important not to miss your dose. Call your doctor or health care professional if you are unable to keep an appointment. What may interact with this medicine? -certain antibiotics given by injection -NSAIDs, medicines for pain and inflammation, like ibuprofen or naproxen -some diuretics like bumetanide, furosemide -teriparatide -thalidomide This list may not describe all possible interactions. Give your health care provider a list of all the medicines, herbs, non-prescription drugs, or dietary supplements you use. Also tell them if you smoke, drink alcohol, or use illegal drugs. Some items may interact with your   medicine. What should I watch for while using this medicine? Visit your doctor or health care professional for regular checkups. It may be some time before you see the benefit from this medicine. Do not stop taking your medicine unless your doctor tells you to. Your doctor may order blood tests or other tests to  see how you are doing. Women should inform their doctor if they wish to become pregnant or think they might be pregnant. There is a potential for serious side effects to an unborn child. Talk to your health care professional or pharmacist for more information. You should make sure that you get enough calcium and vitamin D while you are taking this medicine. Discuss the foods you eat and the vitamins you take with your health care professional. Some people who take this medicine have severe bone, joint, and/or muscle pain. This medicine may also increase your risk for jaw problems or a broken thigh bone. Tell your doctor right away if you have severe pain in your jaw, bones, joints, or muscles. Tell your doctor if you have any pain that does not go away or that gets worse. Tell your dentist and dental surgeon that you are taking this medicine. You should not have major dental surgery while on this medicine. See your dentist to have a dental exam and fix any dental problems before starting this medicine. Take good care of your teeth while on this medicine. Make sure you see your dentist for regular follow-up appointments. What side effects may I notice from receiving this medicine? Side effects that you should report to your doctor or health care professional as soon as possible: -allergic reactions like skin rash, itching or hives, swelling of the face, lips, or tongue -anxiety, confusion, or depression -breathing problems -changes in vision -eye pain -feeling faint or lightheaded, falls -jaw pain, especially after dental work -mouth sores -muscle cramps, stiffness, or weakness -trouble passing urine or change in the amount of urine Side effects that usually do not require medical attention (report to your doctor or health care professional if they continue or are bothersome): -bone, joint, or muscle pain -constipation -diarrhea -fever -hair loss -irritation at site where injected -loss of  appetite -nausea, vomiting -stomach upset -trouble sleeping -trouble swallowing -weak or tired This list may not describe all possible side effects. Call your doctor for medical advice about side effects. You may report side effects to FDA at 1-800-FDA-1088. Where should I keep my medicine? This drug is given in a hospital or clinic and will not be stored at home. NOTE: This sheet is a summary. It may not cover all possible information. If you have questions about this medicine, talk to your doctor, pharmacist, or health care provider.  2014, Elsevier/Gold Standard. (2013-04-20 13:03:13)  

## 2014-02-19 ENCOUNTER — Encounter: Payer: Self-pay | Admitting: Internal Medicine

## 2014-02-19 ENCOUNTER — Encounter (HOSPITAL_COMMUNITY): Payer: Self-pay

## 2014-02-20 ENCOUNTER — Other Ambulatory Visit: Payer: Self-pay | Admitting: *Deleted

## 2014-02-20 DIAGNOSIS — C9000 Multiple myeloma not having achieved remission: Secondary | ICD-10-CM

## 2014-02-21 ENCOUNTER — Other Ambulatory Visit (HOSPITAL_BASED_OUTPATIENT_CLINIC_OR_DEPARTMENT_OTHER): Payer: BC Managed Care – PPO | Admitting: Lab

## 2014-02-21 ENCOUNTER — Ambulatory Visit (HOSPITAL_BASED_OUTPATIENT_CLINIC_OR_DEPARTMENT_OTHER): Payer: BC Managed Care – PPO

## 2014-02-21 ENCOUNTER — Ambulatory Visit: Payer: BC Managed Care – PPO

## 2014-02-21 ENCOUNTER — Other Ambulatory Visit: Payer: BC Managed Care – PPO | Admitting: Lab

## 2014-02-21 VITALS — BP 116/70 | HR 74 | Temp 99.0°F | Resp 16 | Ht 71.0 in | Wt 200.0 lb

## 2014-02-21 DIAGNOSIS — C9000 Multiple myeloma not having achieved remission: Secondary | ICD-10-CM

## 2014-02-21 DIAGNOSIS — Z5112 Encounter for antineoplastic immunotherapy: Secondary | ICD-10-CM

## 2014-02-21 DIAGNOSIS — E291 Testicular hypofunction: Secondary | ICD-10-CM

## 2014-02-21 LAB — CBC WITH DIFFERENTIAL (CANCER CENTER ONLY)
BASO#: 0 10*3/uL (ref 0.0–0.2)
BASO%: 0.2 % (ref 0.0–2.0)
EOS%: 3.4 % (ref 0.0–7.0)
Eosinophils Absolute: 0.2 10*3/uL (ref 0.0–0.5)
HEMATOCRIT: 41.1 % (ref 38.7–49.9)
HGB: 14.3 g/dL (ref 13.0–17.1)
LYMPH#: 1.2 10*3/uL (ref 0.9–3.3)
LYMPH%: 19.7 % (ref 14.0–48.0)
MCH: 31.1 pg (ref 28.0–33.4)
MCHC: 34.8 g/dL (ref 32.0–35.9)
MCV: 89 fL (ref 82–98)
MONO#: 0.9 10*3/uL (ref 0.1–0.9)
MONO%: 14.4 % — ABNORMAL HIGH (ref 0.0–13.0)
NEUT#: 3.9 10*3/uL (ref 1.5–6.5)
NEUT%: 62.3 % (ref 40.0–80.0)
Platelets: 200 10*3/uL (ref 145–400)
RBC: 4.6 10*6/uL (ref 4.20–5.70)
RDW: 12.9 % (ref 11.1–15.7)
WBC: 6.2 10*3/uL (ref 4.0–10.0)

## 2014-02-21 MED ORDER — ONDANSETRON HCL 8 MG PO TABS
8.0000 mg | ORAL_TABLET | Freq: Once | ORAL | Status: AC
Start: 2014-02-21 — End: 2014-02-21
  Administered 2014-02-21: 8 mg via ORAL

## 2014-02-21 MED ORDER — DEXAMETHASONE 4 MG PO TABS
ORAL_TABLET | ORAL | Status: AC
  Filled 2014-02-21: qty 5

## 2014-02-21 MED ORDER — ONDANSETRON HCL 8 MG PO TABS
ORAL_TABLET | ORAL | Status: AC
  Filled 2014-02-21: qty 1

## 2014-02-21 MED ORDER — BORTEZOMIB CHEMO SQ INJECTION 3.5 MG (2.5MG/ML)
1.3000 mg/m2 | Freq: Once | INTRAMUSCULAR | Status: AC
  Administered 2014-02-21: 2.75 mg via SUBCUTANEOUS
  Filled 2014-02-21: qty 2.75

## 2014-02-21 MED ORDER — DEXAMETHASONE 4 MG PO TABS
20.0000 mg | ORAL_TABLET | Freq: Once | ORAL | Status: AC
  Administered 2014-02-21: 20 mg via ORAL

## 2014-02-21 MED ORDER — SILDENAFIL CITRATE 50 MG PO TABS: 50.0000 mg | ORAL_TABLET | Freq: Every day | ORAL | Status: DC | PRN

## 2014-02-21 MED ORDER — TRAMADOL HCL 50 MG PO TABS: ORAL_TABLET | ORAL | Status: DC

## 2014-02-21 NOTE — Patient Instructions (Signed)

## 2014-02-21 NOTE — Progress Notes (Signed)
Received fax notification from [facility] [employer] reporting pt has been enrolled in the [employer] program & will receive his Revlimid for $25 through 2015. dph

## 2014-02-22 ENCOUNTER — Encounter: Payer: BC Managed Care – PPO | Admitting: Internal Medicine

## 2014-02-23 ENCOUNTER — Encounter: Payer: Self-pay | Admitting: Nurse Practitioner

## 2014-02-23 NOTE — Progress Notes (Signed)
Received notification that pt's Revlimid was shipped and scheduled for delivery on 02/23/14.

## 2014-02-28 ENCOUNTER — Ambulatory Visit (HOSPITAL_BASED_OUTPATIENT_CLINIC_OR_DEPARTMENT_OTHER): Payer: BC Managed Care – PPO

## 2014-02-28 ENCOUNTER — Other Ambulatory Visit: Payer: Self-pay | Admitting: Nurse Practitioner

## 2014-02-28 ENCOUNTER — Ambulatory Visit (HOSPITAL_BASED_OUTPATIENT_CLINIC_OR_DEPARTMENT_OTHER): Payer: BC Managed Care – PPO | Admitting: Lab

## 2014-02-28 VITALS — BP 129/79 | HR 74 | Temp 97.5°F | Resp 16 | Wt 196.0 lb

## 2014-02-28 DIAGNOSIS — Z5112 Encounter for antineoplastic immunotherapy: Secondary | ICD-10-CM

## 2014-02-28 DIAGNOSIS — C9000 Multiple myeloma not having achieved remission: Secondary | ICD-10-CM

## 2014-02-28 LAB — CBC WITH DIFFERENTIAL (CANCER CENTER ONLY)
BASO#: 0 10*3/uL (ref 0.0–0.2)
BASO%: 0.3 % (ref 0.0–2.0)
EOS%: 4.5 % (ref 0.0–7.0)
Eosinophils Absolute: 0.4 10*3/uL (ref 0.0–0.5)
HCT: 39.9 % (ref 38.7–49.9)
HGB: 14 g/dL (ref 13.0–17.1)
LYMPH#: 0.7 10*3/uL — ABNORMAL LOW (ref 0.9–3.3)
LYMPH%: 9.4 % — ABNORMAL LOW (ref 14.0–48.0)
MCH: 31.6 pg (ref 28.0–33.4)
MCHC: 35.1 g/dL (ref 32.0–35.9)
MCV: 90 fL (ref 82–98)
MONO#: 0.3 10*3/uL (ref 0.1–0.9)
MONO%: 4 % (ref 0.0–13.0)
NEUT#: 6.3 10*3/uL (ref 1.5–6.5)
NEUT%: 81.8 % — AB (ref 40.0–80.0)
Platelets: 214 10*3/uL (ref 145–400)
RBC: 4.43 10*6/uL (ref 4.20–5.70)
RDW: 12.8 % (ref 11.1–15.7)
WBC: 7.7 10*3/uL (ref 4.0–10.0)

## 2014-02-28 LAB — CMP (CANCER CENTER ONLY)
ALBUMIN: 3.5 g/dL (ref 3.3–5.5)
ALT(SGPT): 27 U/L (ref 10–47)
AST: 31 U/L (ref 11–38)
Alkaline Phosphatase: 44 U/L (ref 26–84)
BUN: 10 mg/dL (ref 7–22)
CALCIUM: 8.7 mg/dL (ref 8.0–10.3)
CO2: 30 meq/L (ref 18–33)
Chloride: 101 mEq/L (ref 98–108)
Creat: 1.1 mg/dl (ref 0.6–1.2)
Glucose, Bld: 145 mg/dL — ABNORMAL HIGH (ref 73–118)
Potassium: 4 mEq/L (ref 3.3–4.7)
SODIUM: 137 meq/L (ref 128–145)
Total Bilirubin: 0.9 mg/dl (ref 0.20–1.60)
Total Protein: 6.7 g/dL (ref 6.4–8.1)

## 2014-02-28 MED ORDER — ONDANSETRON HCL 8 MG PO TABS
ORAL_TABLET | ORAL | Status: AC
  Filled 2014-02-28: qty 1

## 2014-02-28 MED ORDER — DEXAMETHASONE 4 MG PO TABS
20.0000 mg | ORAL_TABLET | Freq: Once | ORAL | Status: AC
  Administered 2014-02-28: 20 mg via ORAL

## 2014-02-28 MED ORDER — DEXAMETHASONE 4 MG PO TABS
ORAL_TABLET | ORAL | Status: AC
  Filled 2014-02-28: qty 5

## 2014-02-28 MED ORDER — BORTEZOMIB CHEMO SQ INJECTION 3.5 MG (2.5MG/ML)
1.3000 mg/m2 | Freq: Once | INTRAMUSCULAR | Status: AC
  Administered 2014-02-28: 2.75 mg via SUBCUTANEOUS
  Filled 2014-02-28: qty 2.75

## 2014-02-28 MED ORDER — ONDANSETRON HCL 8 MG PO TABS
8.0000 mg | ORAL_TABLET | Freq: Once | ORAL | Status: AC
  Administered 2014-02-28: 8 mg via ORAL

## 2014-02-28 NOTE — Progress Notes (Signed)
Pt complain of rash behind both ears.  Started after Revlimid, Dr. Marin Olp notified. Instructed to use OTC 1% Cortisone cream and notify us if that does not help. Verbalized understanding.

## 2014-02-28 NOTE — Patient Instructions (Signed)

## 2014-03-02 LAB — KAPPA/LAMBDA LIGHT CHAINS
KAPPA FREE LGHT CHN: 6.01 mg/dL — AB (ref 0.33–1.94)
Kappa:Lambda Ratio: 3.1 — ABNORMAL HIGH (ref 0.26–1.65)
Lambda Free Lght Chn: 1.94 mg/dL (ref 0.57–2.63)

## 2014-03-02 LAB — PROTEIN ELECTROPHORESIS, SERUM, WITH REFLEX
ALPHA-2-GLOBULIN: 10.9 % (ref 7.1–11.8)
Albumin ELP: 56.8 % (ref 55.8–66.1)
Alpha-1-Globulin: 7.6 % — ABNORMAL HIGH (ref 2.9–4.9)
Beta 2: 3.4 % (ref 3.2–6.5)
Beta Globulin: 5 % (ref 4.7–7.2)
GAMMA GLOBULIN: 16.3 % (ref 11.1–18.8)
Total Protein, Serum Electrophoresis: 6.2 g/dL (ref 6.0–8.3)

## 2014-03-02 LAB — IFE INTERPRETATION

## 2014-03-02 LAB — IGG, IGA, IGM
IgA: 9 mg/dL — ABNORMAL LOW (ref 68–379)
IgG (Immunoglobin G), Serum: 1180 mg/dL (ref 650–1600)
IgM, Serum: 32 mg/dL — ABNORMAL LOW (ref 41–251)

## 2014-03-02 LAB — LACTATE DEHYDROGENASE: LDH: 224 U/L (ref 94–250)

## 2014-03-12 ENCOUNTER — Other Ambulatory Visit: Payer: Self-pay | Admitting: *Deleted

## 2014-03-12 DIAGNOSIS — C9000 Multiple myeloma not having achieved remission: Secondary | ICD-10-CM

## 2014-03-13 ENCOUNTER — Ambulatory Visit (HOSPITAL_BASED_OUTPATIENT_CLINIC_OR_DEPARTMENT_OTHER): Payer: BC Managed Care – PPO

## 2014-03-13 ENCOUNTER — Encounter: Payer: Self-pay | Admitting: Hematology & Oncology

## 2014-03-13 ENCOUNTER — Other Ambulatory Visit (HOSPITAL_BASED_OUTPATIENT_CLINIC_OR_DEPARTMENT_OTHER): Payer: BC Managed Care – PPO | Admitting: Lab

## 2014-03-13 ENCOUNTER — Ambulatory Visit (HOSPITAL_BASED_OUTPATIENT_CLINIC_OR_DEPARTMENT_OTHER): Payer: BC Managed Care – PPO | Admitting: Hematology & Oncology

## 2014-03-13 VITALS — BP 125/67 | HR 58 | Temp 97.7°F | Resp 18 | Ht 71.0 in | Wt 197.0 lb

## 2014-03-13 DIAGNOSIS — C9000 Multiple myeloma not having achieved remission: Secondary | ICD-10-CM

## 2014-03-13 DIAGNOSIS — Z5112 Encounter for antineoplastic immunotherapy: Secondary | ICD-10-CM

## 2014-03-13 LAB — CBC WITH DIFFERENTIAL (CANCER CENTER ONLY)
BASO#: 0.1 10*3/uL (ref 0.0–0.2)
BASO%: 1 % (ref 0.0–2.0)
EOS ABS: 0.4 10*3/uL (ref 0.0–0.5)
EOS%: 8.6 % — ABNORMAL HIGH (ref 0.0–7.0)
HCT: 38.7 % (ref 38.7–49.9)
HEMOGLOBIN: 13.6 g/dL (ref 13.0–17.1)
LYMPH#: 1.1 10*3/uL (ref 0.9–3.3)
LYMPH%: 21.8 % (ref 14.0–48.0)
MCH: 31.3 pg (ref 28.0–33.4)
MCHC: 35.1 g/dL (ref 32.0–35.9)
MCV: 89 fL (ref 82–98)
MONO#: 0.7 10*3/uL (ref 0.1–0.9)
MONO%: 14 % — AB (ref 0.0–13.0)
NEUT%: 54.6 % (ref 40.0–80.0)
NEUTROS ABS: 2.8 10*3/uL (ref 1.5–6.5)
Platelets: 298 10*3/uL (ref 145–400)
RBC: 4.34 10*6/uL (ref 4.20–5.70)
RDW: 13.2 % (ref 11.1–15.7)
WBC: 5.1 10*3/uL (ref 4.0–10.0)

## 2014-03-13 MED ORDER — SODIUM CHLORIDE 0.9 % IV SOLN
Freq: Once | INTRAVENOUS | Status: AC
  Administered 2014-03-13: 10:00:00 via INTRAVENOUS

## 2014-03-13 MED ORDER — ONDANSETRON HCL 8 MG PO TABS
8.0000 mg | ORAL_TABLET | Freq: Once | ORAL | Status: AC
  Administered 2014-03-13: 8 mg via ORAL

## 2014-03-13 MED ORDER — ZOLEDRONIC ACID 4 MG/100ML IV SOLN
4.0000 mg | Freq: Once | INTRAVENOUS | Status: AC
  Administered 2014-03-13: 4 mg via INTRAVENOUS
  Filled 2014-03-13: qty 100

## 2014-03-13 MED ORDER — ONDANSETRON HCL 8 MG PO TABS
ORAL_TABLET | ORAL | Status: AC
  Filled 2014-03-13: qty 1

## 2014-03-13 MED ORDER — BORTEZOMIB CHEMO SQ INJECTION 3.5 MG (2.5MG/ML)
1.3000 mg/m2 | Freq: Once | INTRAMUSCULAR | Status: AC
  Administered 2014-03-13: 2.75 mg via SUBCUTANEOUS
  Filled 2014-03-13: qty 2.75

## 2014-03-13 NOTE — Patient Instructions (Signed)
Bortezomib injection What is this medicine? BORTEZOMIB (bor TEZ oh mib) is a chemotherapy drug. It slows the growth of cancer cells. This medicine is used to treat multiple myeloma, lymphoma, and other cancers. This medicine may be used for other purposes; ask your health care provider or pharmacist if you have questions. COMMON BRAND NAME(S): Velcade What should I tell my health care provider before I take this medicine? They need to know if you have any of these conditions: -heart disease -irregular heartbeat -liver disease -low blood counts, like low white blood cells, platelets, or hemoglobin -peripheral neuropathy -taking medicine for blood pressure -an unusual or allergic reaction to bortezomib, mannitol, boron, other medicines, foods, dyes, or preservatives -pregnant or trying to get pregnant -breast-feeding How should I use this medicine? This medicine is for injection into a vein or for injection under the skin. It is given by a health care professional in a hospital or clinic setting. Talk to your pediatrician regarding the use of this medicine in children. Special care may be needed. Overdosage: If you think you have taken too much of this medicine contact a poison control center or emergency room at once. NOTE: This medicine is only for you. Do not share this medicine with others. What if I miss a dose? It is important not to miss your dose. Call your doctor or health care professional if you are unable to keep an appointment. What may interact with this medicine? -medicines for diabetes -medicines to increase blood counts like filgrastim, pegfilgrastim, sargramostim -zalcitabine Talk to your doctor or health care professional before taking any of these medicines: -acetaminophen -aspirin -ibuprofen -ketoprofen -naproxen This list may not describe all possible interactions. Give your health care provider a list of all the medicines, herbs, non-prescription drugs, or  dietary supplements you use. Also tell them if you smoke, drink alcohol, or use illegal drugs. Some items may interact with your medicine. What should I watch for while using this medicine? Visit your doctor for checks on your progress. This drug may make you feel generally unwell. This is not uncommon, as chemotherapy can affect healthy cells as well as cancer cells. Report any side effects. Continue your course of treatment even though you feel ill unless your doctor tells you to stop. You may get drowsy or dizzy. Do not drive, use machinery, or do anything that needs mental alertness until you know how this medicine affects you. Do not stand or sit up quickly, especially if you are an older patient. This reduces the risk of dizzy or fainting spells. In some cases, you may be given additional medicines to help with side effects. Follow all directions for their use. Call your doctor or health care professional for advice if you get a fever, chills or sore throat, or other symptoms of a cold or flu. Do not treat yourself. This drug decreases your body's ability to fight infections. Try to avoid being around people who are sick. This medicine may increase your risk to bruise or bleed. Call your doctor or health care professional if you notice any unusual bleeding. Be careful brushing and flossing your teeth or using a toothpick because you may get an infection or bleed more easily. If you have any dental work done, tell your dentist you are receiving this medicine. Avoid taking products that contain aspirin, acetaminophen, ibuprofen, naproxen, or ketoprofen unless instructed by your doctor. These medicines may hide a fever. Do not become pregnant while taking this medicine. Women should inform their doctor   if they wish to become pregnant or think they might be pregnant. There is a potential for serious side effects to an unborn child. Talk to your health care professional or pharmacist for more information.  Do not breast-feed an infant while taking this medicine. You may have vomiting or diarrhea while taking this medicine. Drink water or other fluids as directed. What side effects may I notice from receiving this medicine? Side effects that you should report to your doctor or health care professional as soon as possible: -allergic reactions like skin rash, itching or hives, swelling of the face, lips, or tongue -breathing problems -changes in hearing -changes in vision -fast, irregular heartbeat -feeling faint or lightheaded, falls -pain, tingling, numbness in the hands or feet -seizures -swelling of the ankles, feet, hands -unusual bleeding or bruising -unusually weak or tired -vomiting Side effects that usually do not require medical attention (report to your doctor or health care professional if they continue or are bothersome): -changes in emotions or moods -constipation -diarrhea -loss of appetite -headache -irritation at site where injected -nausea This list may not describe all possible side effects. Call your doctor for medical advice about side effects. You may report side effects to FDA at 1-800-FDA-1088. Where should I keep my medicine? This drug is given in a hospital or clinic and will not be stored at home. NOTE: This sheet is a summary. It may not cover all possible information. If you have questions about this medicine, talk to your doctor, pharmacist, or health care provider.  2014, Elsevier/Gold Standard. (2010-12-17 11:42:36) Zoledronic Acid injection (Hypercalcemia, Oncology) What is this medicine? ZOLEDRONIC ACID (ZOE le dron ik AS id) lowers the amount of calcium loss from bone. It is used to treat too much calcium in your blood from cancer. It is also used to prevent complications of cancer that has spread to the bone. This medicine may be used for other purposes; ask your health care provider or pharmacist if you have questions. COMMON BRAND NAME(S):  Zometa What should I tell my health care provider before I take this medicine? They need to know if you have any of these conditions: -aspirin-sensitive asthma -cancer, especially if you are receiving medicines used to treat cancer -dental disease or wear dentures -infection -kidney disease -receiving corticosteroids like dexamethasone or prednisone -an unusual or allergic reaction to zoledronic acid, other medicines, foods, dyes, or preservatives -pregnant or trying to get pregnant -breast-feeding How should I use this medicine? This medicine is for infusion into a vein. It is given by a health care professional in a hospital or clinic setting. Talk to your pediatrician regarding the use of this medicine in children. Special care may be needed. Overdosage: If you think you have taken too much of this medicine contact a poison control center or emergency room at once. NOTE: This medicine is only for you. Do not share this medicine with others. What if I miss a dose? It is important not to miss your dose. Call your doctor or health care professional if you are unable to keep an appointment. What may interact with this medicine? -certain antibiotics given by injection -NSAIDs, medicines for pain and inflammation, like ibuprofen or naproxen -some diuretics like bumetanide, furosemide -teriparatide -thalidomide This list may not describe all possible interactions. Give your health care provider a list of all the medicines, herbs, non-prescription drugs, or dietary supplements you use. Also tell them if you smoke, drink alcohol, or use illegal drugs. Some items may interact with your   medicine. What should I watch for while using this medicine? Visit your doctor or health care professional for regular checkups. It may be some time before you see the benefit from this medicine. Do not stop taking your medicine unless your doctor tells you to. Your doctor may order blood tests or other tests to  see how you are doing. Women should inform their doctor if they wish to become pregnant or think they might be pregnant. There is a potential for serious side effects to an unborn child. Talk to your health care professional or pharmacist for more information. You should make sure that you get enough calcium and vitamin D while you are taking this medicine. Discuss the foods you eat and the vitamins you take with your health care professional. Some people who take this medicine have severe bone, joint, and/or muscle pain. This medicine may also increase your risk for jaw problems or a broken thigh bone. Tell your doctor right away if you have severe pain in your jaw, bones, joints, or muscles. Tell your doctor if you have any pain that does not go away or that gets worse. Tell your dentist and dental surgeon that you are taking this medicine. You should not have major dental surgery while on this medicine. See your dentist to have a dental exam and fix any dental problems before starting this medicine. Take good care of your teeth while on this medicine. Make sure you see your dentist for regular follow-up appointments. What side effects may I notice from receiving this medicine? Side effects that you should report to your doctor or health care professional as soon as possible: -allergic reactions like skin rash, itching or hives, swelling of the face, lips, or tongue -anxiety, confusion, or depression -breathing problems -changes in vision -eye pain -feeling faint or lightheaded, falls -jaw pain, especially after dental work -mouth sores -muscle cramps, stiffness, or weakness -trouble passing urine or change in the amount of urine Side effects that usually do not require medical attention (report to your doctor or health care professional if they continue or are bothersome): -bone, joint, or muscle pain -constipation -diarrhea -fever -hair loss -irritation at site where injected -loss of  appetite -nausea, vomiting -stomach upset -trouble sleeping -trouble swallowing -weak or tired This list may not describe all possible side effects. Call your doctor for medical advice about side effects. You may report side effects to FDA at 1-800-FDA-1088. Where should I keep my medicine? This drug is given in a hospital or clinic and will not be stored at home. NOTE: This sheet is a summary. It may not cover all possible information. If you have questions about this medicine, talk to your doctor, pharmacist, or health care provider.  2014, Elsevier/Gold Standard. (2013-04-20 13:03:13)  

## 2014-03-14 ENCOUNTER — Other Ambulatory Visit: Payer: Self-pay | Admitting: Nurse Practitioner

## 2014-03-14 DIAGNOSIS — C9000 Multiple myeloma not having achieved remission: Secondary | ICD-10-CM

## 2014-03-14 DIAGNOSIS — N289 Disorder of kidney and ureter, unspecified: Secondary | ICD-10-CM

## 2014-03-14 MED ORDER — LENALIDOMIDE 25 MG PO CAPS: 25.0000 mg | ORAL_CAPSULE | Freq: Every day | ORAL | Status: DC

## 2014-03-14 NOTE — Progress Notes (Signed)
Hematology and Oncology Follow Up Visit  Jimmy Day [id number] 1961/09/16 53 y.o. 03/14/2014   Principle Diagnosis:   Kappa light chain myeloma  Current Therapy:    Status post 1 cycle of Velcade/Revlimid/Decadron  Zometa 4 mg IV every month     Interim History:  Jimmy Day is back for followup. I first saw him a month ago. He has light chain myeloma. We did do studies on him. His 0.4 urine showed 175 mg per day of Kappa light chain excretion. He had a PET scan which showed numerous lytic lesions but without activity.  Restart him on systemic chemotherapy. He's done well so far.  Is still working. He still working out. He's not having pain. Is having no nausea vomiting. He's had no cough or shortness of breath. He is on aspirin. He is on Famvir.         Medications: Current outpatient prescriptions:amLODipine (NORVASC) 5 MG tablet, Take 5 mg by mouth daily., Disp: , Rfl: ;  aspirin 325 MG tablet, Take 325 mg by mouth daily., Disp: , Rfl: ;  atorvastatin (LIPITOR) 20 MG tablet, Take 1 tablet (20 mg total) by mouth daily., Disp: 90 tablet, Rfl: 3;  famciclovir (FAMVIR) 250 MG tablet, Take 1 tablet a day, Disp: 30 tablet, Rfl: 8 lenalidomide (REVLIMID) 25 MG capsule, Take 1 capsule (25 mg total) by mouth daily., Disp: 21 capsule, Rfl: 6;  LORazepam (ATIVAN) 0.5 MG tablet, Take 1 tablet (0.5 mg total) by mouth every 6 (six) hours as needed (Nausea or vomiting)., Disp: 30 tablet, Rfl: 0 ondansetron (ZOFRAN) 8 MG tablet, Take 1 tablet (8 mg total) by mouth 2 (two) times daily. Start the day after chemo for 2 days. Then take as needed for nausea or vomiting., Disp: 30 tablet, Rfl: 1;  peg 3350 powder (MOVIPREP) 100 G SOLR, moviprep as directed, no substitutions, Disp: 1 kit, Rfl: 0;  polyethylene glycol (MIRALAX / GLYCOLAX) packet, Take 17 g by mouth daily., Disp: 30 each, Rfl: 6 prochlorperazine (COMPAZINE) 10 MG tablet, Take 1 tablet (10 mg total) by mouth every 6 (six) hours as needed  (Nausea or vomiting)., Disp: 30 tablet, Rfl: 1;  psyllium (METAMUCIL) 58.6 % powder, Take 1 packet by mouth daily., Disp: , Rfl: ;  sildenafil (VIAGRA) 50 MG tablet, Take 1 tablet (50 mg total) by mouth daily as needed for erectile dysfunction., Disp: 10 tablet, Rfl: 2 traMADol (ULTRAM) 50 MG tablet, Take 1-2 if needed for pain every 6 hours, Disp: 90 tablet, Rfl: 3  Allergies: No Known Allergies  Past Medical History, Surgical history, Social history, and Family History were reviewed and updated.  Review of Systems: As above  Physical Exam:  height is '5\' 11"'  (1.803 m) and weight is 197 lb (89.359 kg). His oral temperature is 97.7 F (36.5 C). His blood pressure is 125/67 and his pulse is 58. His respiration is 18.   Lungs are clear. Cardiac exam regular in rhythm. Abdomen soft. No liver or spleen. Skin no rash. Extremities good strength. Good range of motion of his joints. Neurological exam no focal deficits. Lymph nodes none palpable. Oral exam no mucositis.  Lab Results  Component Value Date   WBC 5.1 03/13/2014   HGB 13.6 03/13/2014   HCT 38.7 03/13/2014   MCV 89 03/13/2014   PLT 298 03/13/2014     Chemistry      Component Value Date/Time   NA 137 02/28/2014 1104   NA 137 02/02/2014 1557   K 4.0  02/28/2014 1104   K 3.8 02/02/2014 1557   CL 101 02/28/2014 1104   CL 103 02/02/2014 1557   CO2 30 02/28/2014 1104   CO2 27 02/02/2014 1557   BUN 10 02/28/2014 1104   BUN 12 02/02/2014 1557   CREATININE 1.1 02/28/2014 1104   CREATININE 1.22 02/02/2014 1557      Component Value Date/Time   CALCIUM 8.7 02/28/2014 1104   CALCIUM 9.2 02/02/2014 1557   ALKPHOS 44 02/28/2014 1104   ALKPHOS 47 02/02/2014 1557   AST 31 02/28/2014 1104   AST 16 02/02/2014 1557   ALT 27 02/28/2014 1104   ALT 15 02/02/2014 1557   BILITOT 0.90 02/28/2014 1104   BILITOT 0.4 02/02/2014 1557         Impression and Plan: Jimmy Day is 53 year old gentleman with Kappa light chain myeloma. He has had one cycle of chemotherapy to  date.  We will proceed with his second cycle of chemotherapy.  We'll recheck his myeloma studies with his next cycle. Hopefully we will be seen his light chain improving.  His quality of life is fantastic. He's had no problems with pain. He's eating well.  I'll plan to see her back in one month myself.   [name], MD 4/22/20156:21 AM

## 2014-03-21 ENCOUNTER — Ambulatory Visit (HOSPITAL_BASED_OUTPATIENT_CLINIC_OR_DEPARTMENT_OTHER): Payer: BC Managed Care – PPO | Admitting: Lab

## 2014-03-21 ENCOUNTER — Ambulatory Visit: Payer: BC Managed Care – PPO

## 2014-03-21 ENCOUNTER — Ambulatory Visit (HOSPITAL_BASED_OUTPATIENT_CLINIC_OR_DEPARTMENT_OTHER): Payer: BC Managed Care – PPO

## 2014-03-21 ENCOUNTER — Other Ambulatory Visit: Payer: BC Managed Care – PPO | Admitting: Lab

## 2014-03-21 VITALS — BP 116/72 | HR 62 | Temp 97.1°F | Resp 18

## 2014-03-21 DIAGNOSIS — C9000 Multiple myeloma not having achieved remission: Secondary | ICD-10-CM

## 2014-03-21 DIAGNOSIS — Z5112 Encounter for antineoplastic immunotherapy: Secondary | ICD-10-CM

## 2014-03-21 LAB — CBC WITH DIFFERENTIAL (CANCER CENTER ONLY)
BASO#: 0.1 10*3/uL (ref 0.0–0.2)
BASO%: 2.2 % — AB (ref 0.0–2.0)
EOS%: 5.3 % (ref 0.0–7.0)
Eosinophils Absolute: 0.3 10*3/uL (ref 0.0–0.5)
HCT: 40.5 % (ref 38.7–49.9)
HGB: 14.3 g/dL (ref 13.0–17.1)
LYMPH#: 1.9 10*3/uL (ref 0.9–3.3)
LYMPH%: 33.7 % (ref 14.0–48.0)
MCH: 31.4 pg (ref 28.0–33.4)
MCHC: 35.3 g/dL (ref 32.0–35.9)
MCV: 89 fL (ref 82–98)
MONO#: 0.4 10*3/uL (ref 0.1–0.9)
MONO%: 8 % (ref 0.0–13.0)
NEUT%: 50.8 % (ref 40.0–80.0)
NEUTROS ABS: 2.8 10*3/uL (ref 1.5–6.5)
PLATELETS: 300 10*3/uL (ref 145–400)
RBC: 4.55 10*6/uL (ref 4.20–5.70)
RDW: 13.1 % (ref 11.1–15.7)
WBC: 5.5 10*3/uL (ref 4.0–10.0)

## 2014-03-21 LAB — CMP (CANCER CENTER ONLY)
ALT: 18 U/L (ref 10–47)
AST: 13 U/L (ref 11–38)
Albumin: 3.7 g/dL (ref 3.3–5.5)
Alkaline Phosphatase: 66 U/L (ref 26–84)
BUN, Bld: 11 mg/dL (ref 7–22)
CHLORIDE: 104 meq/L (ref 98–108)
CO2: 29 meq/L (ref 18–33)
Calcium: 9.1 mg/dL (ref 8.0–10.3)
Creat: 1.1 mg/dl (ref 0.6–1.2)
Glucose, Bld: 123 mg/dL — ABNORMAL HIGH (ref 73–118)
Potassium: 3.8 mEq/L (ref 3.3–4.7)
Sodium: 138 mEq/L (ref 128–145)
TOTAL PROTEIN: 7.4 g/dL (ref 6.4–8.1)
Total Bilirubin: 0.8 mg/dl (ref 0.20–1.60)

## 2014-03-21 MED ORDER — ONDANSETRON HCL 8 MG PO TABS
8.0000 mg | ORAL_TABLET | Freq: Once | ORAL | Status: AC
  Administered 2014-03-21: 8 mg via ORAL

## 2014-03-21 MED ORDER — DEXAMETHASONE 4 MG PO TABS
20.0000 mg | ORAL_TABLET | Freq: Once | ORAL | Status: AC
  Administered 2014-03-21: 20 mg via ORAL

## 2014-03-21 MED ORDER — ONDANSETRON HCL 8 MG PO TABS
ORAL_TABLET | ORAL | Status: AC
  Filled 2014-03-21: qty 1

## 2014-03-21 MED ORDER — BORTEZOMIB CHEMO SQ INJECTION 3.5 MG (2.5MG/ML)
1.3000 mg/m2 | Freq: Once | INTRAMUSCULAR | Status: AC
  Administered 2014-03-21: 2.75 mg via SUBCUTANEOUS
  Filled 2014-03-21: qty 2.75

## 2014-03-21 NOTE — Patient Instructions (Signed)

## 2014-03-23 ENCOUNTER — Encounter: Payer: Self-pay | Admitting: Nurse Practitioner

## 2014-03-23 LAB — PROTEIN ELECTROPHORESIS, SERUM, WITH REFLEX
ALPHA-1-GLOBULIN: 6.8 % — AB (ref 2.9–4.9)
ALPHA-2-GLOBULIN: 9.2 % (ref 7.1–11.8)
Albumin ELP: 58.5 % (ref 55.8–66.1)
BETA GLOBULIN: 5.5 % (ref 4.7–7.2)
Beta 2: 3 % — ABNORMAL LOW (ref 3.2–6.5)
GAMMA GLOBULIN: 17 % (ref 11.1–18.8)
Total Protein, Serum Electrophoresis: 6.5 g/dL (ref 6.0–8.3)

## 2014-03-23 LAB — KAPPA/LAMBDA LIGHT CHAINS
KAPPA FREE LGHT CHN: 3.29 mg/dL — AB (ref 0.33–1.94)
Kappa:Lambda Ratio: 1.89 — ABNORMAL HIGH (ref 0.26–1.65)
LAMBDA FREE LGHT CHN: 1.74 mg/dL (ref 0.57–2.63)

## 2014-03-23 LAB — LACTATE DEHYDROGENASE: LDH: 150 U/L (ref 94–250)

## 2014-03-23 LAB — IGG, IGA, IGM
IgA: 15 mg/dL — ABNORMAL LOW (ref 68–379)
IgG (Immunoglobin G), Serum: 1410 mg/dL (ref 650–1600)
IgM, Serum: 40 mg/dL — ABNORMAL LOW (ref 41–251)

## 2014-03-23 LAB — IFE INTERPRETATION

## 2014-03-26 ENCOUNTER — Other Ambulatory Visit: Payer: BC Managed Care – PPO | Admitting: Lab

## 2014-03-27 ENCOUNTER — Other Ambulatory Visit: Payer: Self-pay | Admitting: *Deleted

## 2014-03-27 DIAGNOSIS — C9000 Multiple myeloma not having achieved remission: Secondary | ICD-10-CM

## 2014-03-28 ENCOUNTER — Other Ambulatory Visit (HOSPITAL_BASED_OUTPATIENT_CLINIC_OR_DEPARTMENT_OTHER): Payer: BC Managed Care – PPO | Admitting: Lab

## 2014-03-28 ENCOUNTER — Ambulatory Visit (HOSPITAL_BASED_OUTPATIENT_CLINIC_OR_DEPARTMENT_OTHER): Payer: BC Managed Care – PPO

## 2014-03-28 VITALS — BP 116/74 | HR 66 | Temp 97.8°F | Resp 18

## 2014-03-28 DIAGNOSIS — C9000 Multiple myeloma not having achieved remission: Secondary | ICD-10-CM

## 2014-03-28 DIAGNOSIS — Z5112 Encounter for antineoplastic immunotherapy: Secondary | ICD-10-CM

## 2014-03-28 LAB — UIFE/LIGHT CHAINS/TP QN, 24-HR UR
ALPHA 2 UR: DETECTED — AB
Albumin, U: DETECTED
Alpha 1, Urine: DETECTED — AB
BETA UR: DETECTED — AB
FREE LT CHN EXCR RATE: 22.12 mg/d
Free Kappa Lt Chains,Ur: 0.79 mg/dL (ref 0.14–2.42)
Free Kappa/Lambda Ratio: 13.17 ratio — ABNORMAL HIGH (ref 2.04–10.37)
Free Lambda Excretion/Day: 1.68 mg/d
Free Lambda Lt Chains,Ur: 0.06 mg/dL (ref 0.02–0.67)
Gamma Globulin, Urine: DETECTED — AB
TOTAL PROTEIN, URINE-UPE24: 1.4 mg/dL
Time: 24 hours
Total Protein, Urine-Ur/day: 39 mg/d (ref 10–140)
Volume, Urine: 2800 mL

## 2014-03-28 LAB — CBC WITH DIFFERENTIAL (CANCER CENTER ONLY)
BASO#: 0.1 10*3/uL (ref 0.0–0.2)
BASO%: 2.1 % — ABNORMAL HIGH (ref 0.0–2.0)
EOS%: 8.7 % — ABNORMAL HIGH (ref 0.0–7.0)
Eosinophils Absolute: 0.5 10*3/uL (ref 0.0–0.5)
HEMATOCRIT: 41.5 % (ref 38.7–49.9)
HGB: 14.5 g/dL (ref 13.0–17.1)
LYMPH#: 1.3 10*3/uL (ref 0.9–3.3)
LYMPH%: 25.4 % (ref 14.0–48.0)
MCH: 31.2 pg (ref 28.0–33.4)
MCHC: 34.9 g/dL (ref 32.0–35.9)
MCV: 89 fL (ref 82–98)
MONO#: 0.5 10*3/uL (ref 0.1–0.9)
MONO%: 8.7 % (ref 0.0–13.0)
NEUT#: 2.8 10*3/uL (ref 1.5–6.5)
NEUT%: 55.1 % (ref 40.0–80.0)
Platelets: 234 10*3/uL (ref 145–400)
RBC: 4.65 10*6/uL (ref 4.20–5.70)
RDW: 13.3 % (ref 11.1–15.7)
WBC: 5.2 10*3/uL (ref 4.0–10.0)

## 2014-03-28 MED ORDER — ONDANSETRON HCL 8 MG PO TABS
8.0000 mg | ORAL_TABLET | Freq: Once | ORAL | Status: AC
  Administered 2014-03-28: 8 mg via ORAL

## 2014-03-28 MED ORDER — ONDANSETRON HCL 8 MG PO TABS
ORAL_TABLET | ORAL | Status: AC
  Filled 2014-03-28: qty 1

## 2014-03-28 MED ORDER — BORTEZOMIB CHEMO SQ INJECTION 3.5 MG (2.5MG/ML)
1.3000 mg/m2 | Freq: Once | INTRAMUSCULAR | Status: AC
  Administered 2014-03-28: 2.75 mg via SUBCUTANEOUS
  Filled 2014-03-28: qty 2.75

## 2014-03-28 NOTE — Patient Instructions (Signed)

## 2014-03-30 ENCOUNTER — Encounter: Payer: Self-pay | Admitting: *Deleted

## 2014-04-10 ENCOUNTER — Other Ambulatory Visit: Payer: Self-pay | Admitting: *Deleted

## 2014-04-10 ENCOUNTER — Other Ambulatory Visit: Payer: Self-pay | Admitting: Nurse Practitioner

## 2014-04-10 DIAGNOSIS — C9000 Multiple myeloma not having achieved remission: Secondary | ICD-10-CM

## 2014-04-11 ENCOUNTER — Other Ambulatory Visit: Payer: Self-pay | Admitting: *Deleted

## 2014-04-11 ENCOUNTER — Ambulatory Visit (HOSPITAL_BASED_OUTPATIENT_CLINIC_OR_DEPARTMENT_OTHER): Payer: BC Managed Care – PPO | Admitting: Hematology & Oncology

## 2014-04-11 ENCOUNTER — Ambulatory Visit (HOSPITAL_BASED_OUTPATIENT_CLINIC_OR_DEPARTMENT_OTHER): Payer: BC Managed Care – PPO

## 2014-04-11 ENCOUNTER — Encounter: Payer: Self-pay | Admitting: Hematology & Oncology

## 2014-04-11 ENCOUNTER — Ambulatory Visit (HOSPITAL_BASED_OUTPATIENT_CLINIC_OR_DEPARTMENT_OTHER): Payer: BC Managed Care – PPO | Admitting: Lab

## 2014-04-11 VITALS — BP 123/65 | HR 57 | Temp 97.7°F | Resp 18 | Ht 70.0 in | Wt 196.0 lb

## 2014-04-11 DIAGNOSIS — C9000 Multiple myeloma not having achieved remission: Secondary | ICD-10-CM

## 2014-04-11 DIAGNOSIS — N289 Disorder of kidney and ureter, unspecified: Secondary | ICD-10-CM

## 2014-04-11 LAB — CBC WITH DIFFERENTIAL (CANCER CENTER ONLY)
BASO#: 0 10*3/uL (ref 0.0–0.2)
BASO%: 0.7 % (ref 0.0–2.0)
EOS%: 10.7 % — AB (ref 0.0–7.0)
Eosinophils Absolute: 0.5 10*3/uL (ref 0.0–0.5)
HEMATOCRIT: 41.9 % (ref 38.7–49.9)
HGB: 14.8 g/dL (ref 13.0–17.1)
LYMPH#: 1.2 10*3/uL (ref 0.9–3.3)
LYMPH%: 29 % (ref 14.0–48.0)
MCH: 31.3 pg (ref 28.0–33.4)
MCHC: 35.3 g/dL (ref 32.0–35.9)
MCV: 89 fL (ref 82–98)
MONO#: 0.5 10*3/uL (ref 0.1–0.9)
MONO%: 11.6 % (ref 0.0–13.0)
NEUT%: 48 % (ref 40.0–80.0)
NEUTROS ABS: 2 10*3/uL (ref 1.5–6.5)
Platelets: 175 10*3/uL (ref 145–400)
RBC: 4.73 10*6/uL (ref 4.20–5.70)
RDW: 13.1 % (ref 11.1–15.7)
WBC: 4.2 10*3/uL (ref 4.0–10.0)

## 2014-04-11 LAB — CMP (CANCER CENTER ONLY)
ALT: 23 U/L (ref 10–47)
AST: 20 U/L (ref 11–38)
Albumin: 3.7 g/dL (ref 3.3–5.5)
Alkaline Phosphatase: 40 U/L (ref 26–84)
BUN, Bld: 10 mg/dL (ref 7–22)
CHLORIDE: 102 meq/L (ref 98–108)
CO2: 30 meq/L (ref 18–33)
CREATININE: 1.1 mg/dL (ref 0.6–1.2)
Calcium: 8.9 mg/dL (ref 8.0–10.3)
Glucose, Bld: 107 mg/dL (ref 73–118)
Potassium: 3.2 mEq/L — ABNORMAL LOW (ref 3.3–4.7)
SODIUM: 137 meq/L (ref 128–145)
TOTAL PROTEIN: 6.7 g/dL (ref 6.4–8.1)
Total Bilirubin: 1.2 mg/dl (ref 0.20–1.60)

## 2014-04-11 MED ORDER — DEXAMETHASONE 4 MG PO TABS
20.0000 mg | ORAL_TABLET | Freq: Once | ORAL | Status: AC
  Administered 2014-04-11: 20 mg via ORAL

## 2014-04-11 MED ORDER — ONDANSETRON HCL 8 MG PO TABS
ORAL_TABLET | ORAL | Status: AC
  Filled 2014-04-11: qty 1

## 2014-04-11 MED ORDER — LENALIDOMIDE 25 MG PO CAPS: 25.0000 mg | ORAL_CAPSULE | Freq: Every day | ORAL | Status: DC

## 2014-04-11 MED ORDER — BORTEZOMIB CHEMO SQ INJECTION 3.5 MG (2.5MG/ML)
1.3000 mg/m2 | Freq: Once | INTRAMUSCULAR | Status: AC
  Administered 2014-04-11: 2.75 mg via SUBCUTANEOUS
  Filled 2014-04-11: qty 2.75

## 2014-04-11 MED ORDER — ZOLEDRONIC ACID 4 MG/100ML IV SOLN
4.0000 mg | Freq: Once | INTRAVENOUS | Status: AC
  Administered 2014-04-11: 4 mg via INTRAVENOUS
  Filled 2014-04-11: qty 100

## 2014-04-11 MED ORDER — SODIUM CHLORIDE 0.9 % IV SOLN
Freq: Once | INTRAVENOUS | Status: AC
  Administered 2014-04-11: 11:00:00 via INTRAVENOUS

## 2014-04-11 MED ORDER — ONDANSETRON HCL 8 MG PO TABS
8.0000 mg | ORAL_TABLET | Freq: Once | ORAL | Status: AC
  Administered 2014-04-11: 8 mg via ORAL

## 2014-04-11 NOTE — Patient Instructions (Addendum)
Zoledronic Acid injection (Hypercalcemia, Oncology) What is this medicine? ZOLEDRONIC ACID (ZOE le dron ik AS id) lowers the amount of calcium loss from bone. It is used to treat too much calcium in your blood from cancer. It is also used to prevent complications of cancer that has spread to the bone. This medicine may be used for other purposes; ask your health care provider or pharmacist if you have questions. COMMON BRAND NAME(S): Zometa What should I tell my health care provider before I take this medicine? They need to know if you have any of these conditions: -aspirin-sensitive asthma -cancer, especially if you are receiving medicines used to treat cancer -dental disease or wear dentures -infection -kidney disease -receiving corticosteroids like dexamethasone or prednisone -an unusual or allergic reaction to zoledronic acid, other medicines, foods, dyes, or preservatives -pregnant or trying to get pregnant -breast-feeding How should I use this medicine? This medicine is for infusion into a vein. It is given by a health care professional in a hospital or clinic setting. Talk to your pediatrician regarding the use of this medicine in children. Special care may be needed. Overdosage: If you think you have taken too much of this medicine contact a poison control center or emergency room at once. NOTE: This medicine is only for you. Do not share this medicine with others. What if I miss a dose? It is important not to miss your dose. Call your doctor or health care professional if you are unable to keep an appointment. What may interact with this medicine? -certain antibiotics given by injection -NSAIDs, medicines for pain and inflammation, like ibuprofen or naproxen -some diuretics like bumetanide, furosemide -teriparatide -thalidomide This list may not describe all possible interactions. Give your health care provider a list of all the medicines, herbs, non-prescription drugs, or  dietary supplements you use. Also tell them if you smoke, drink alcohol, or use illegal drugs. Some items may interact with your medicine. What should I watch for while using this medicine? Visit your doctor or health care professional for regular checkups. It may be some time before you see the benefit from this medicine. Do not stop taking your medicine unless your doctor tells you to. Your doctor may order blood tests or other tests to see how you are doing. Women should inform their doctor if they wish to become pregnant or think they might be pregnant. There is a potential for serious side effects to an unborn child. Talk to your health care professional or pharmacist for more information. You should make sure that you get enough calcium and vitamin D while you are taking this medicine. Discuss the foods you eat and the vitamins you take with your health care professional. Some people who take this medicine have severe bone, joint, and/or muscle pain. This medicine may also increase your risk for jaw problems or a broken thigh bone. Tell your doctor right away if you have severe pain in your jaw, bones, joints, or muscles. Tell your doctor if you have any pain that does not go away or that gets worse. Tell your dentist and dental surgeon that you are taking this medicine. You should not have major dental surgery while on this medicine. See your dentist to have a dental exam and fix any dental problems before starting this medicine. Take good care of your teeth while on this medicine. Make sure you see your dentist for regular follow-up appointments. What side effects may I notice from receiving this medicine? Side effects that   you should report to your doctor or health care professional as soon as possible: -allergic reactions like skin rash, itching or hives, swelling of the face, lips, or tongue -anxiety, confusion, or depression -breathing problems -changes in vision -eye pain -feeling faint or  lightheaded, falls -jaw pain, especially after dental work -mouth sores -muscle cramps, stiffness, or weakness -trouble passing urine or change in the amount of urine Side effects that usually do not require medical attention (report to your doctor or health care professional if they continue or are bothersome): -bone, joint, or muscle pain -constipation -diarrhea -fever -hair loss -irritation at site where injected -loss of appetite -nausea, vomiting -stomach upset -trouble sleeping -trouble swallowing -weak or tired This list may not describe all possible side effects. Call your doctor for medical advice about side effects. You may report side effects to FDA at 1-800-FDA-1088. Where should I keep my medicine? This drug is given in a hospital or clinic and will not be stored at home. NOTE: This sheet is a summary. It may not cover all possible information. If you have questions about this medicine, talk to your doctor, pharmacist, or health care provider.  2014, Elsevier/Gold Standard. (2013-04-20 13:03:13) Bortezomib injection What is this medicine? BORTEZOMIB (bor TEZ oh mib) is a chemotherapy drug. It slows the growth of cancer cells. This medicine is used to treat multiple myeloma, lymphoma, and other cancers. This medicine may be used for other purposes; ask your health care provider or pharmacist if you have questions. COMMON BRAND NAME(S): Velcade What should I tell my health care provider before I take this medicine? They need to know if you have any of these conditions: -heart disease -irregular heartbeat -liver disease -low blood counts, like low white blood cells, platelets, or hemoglobin -peripheral neuropathy -taking medicine for blood pressure -an unusual or allergic reaction to bortezomib, mannitol, boron, other medicines, foods, dyes, or preservatives -pregnant or trying to get pregnant -breast-feeding How should I use this medicine? This medicine is for  injection into a vein or for injection under the skin. It is given by a health care professional in a hospital or clinic setting. Talk to your pediatrician regarding the use of this medicine in children. Special care may be needed. Overdosage: If you think you have taken too much of this medicine contact a poison control center or emergency room at once. NOTE: This medicine is only for you. Do not share this medicine with others. What if I miss a dose? It is important not to miss your dose. Call your doctor or health care professional if you are unable to keep an appointment. What may interact with this medicine? -medicines for diabetes -medicines to increase blood counts like filgrastim, pegfilgrastim, sargramostim -zalcitabine Talk to your doctor or health care professional before taking any of these medicines: -acetaminophen -aspirin -ibuprofen -ketoprofen -naproxen This list may not describe all possible interactions. Give your health care provider a list of all the medicines, herbs, non-prescription drugs, or dietary supplements you use. Also tell them if you smoke, drink alcohol, or use illegal drugs. Some items may interact with your medicine. What should I watch for while using this medicine? Visit your doctor for checks on your progress. This drug may make you feel generally unwell. This is not uncommon, as chemotherapy can affect healthy cells as well as cancer cells. Report any side effects. Continue your course of treatment even though you feel ill unless your doctor tells you to stop. You may get drowsy or dizzy.   Do not drive, use machinery, or do anything that needs mental alertness until you know how this medicine affects you. Do not stand or sit up quickly, especially if you are an older patient. This reduces the risk of dizzy or fainting spells. In some cases, you may be given additional medicines to help with side effects. Follow all directions for their use. Call your doctor  or health care professional for advice if you get a fever, chills or sore throat, or other symptoms of a cold or flu. Do not treat yourself. This drug decreases your body's ability to fight infections. Try to avoid being around people who are sick. This medicine may increase your risk to bruise or bleed. Call your doctor or health care professional if you notice any unusual bleeding. Be careful brushing and flossing your teeth or using a toothpick because you may get an infection or bleed more easily. If you have any dental work done, tell your dentist you are receiving this medicine. Avoid taking products that contain aspirin, acetaminophen, ibuprofen, naproxen, or ketoprofen unless instructed by your doctor. These medicines may hide a fever. Do not become pregnant while taking this medicine. Women should inform their doctor if they wish to become pregnant or think they might be pregnant. There is a potential for serious side effects to an unborn child. Talk to your health care professional or pharmacist for more information. Do not breast-feed an infant while taking this medicine. You may have vomiting or diarrhea while taking this medicine. Drink water or other fluids as directed. What side effects may I notice from receiving this medicine? Side effects that you should report to your doctor or health care professional as soon as possible: -allergic reactions like skin rash, itching or hives, swelling of the face, lips, or tongue -breathing problems -changes in hearing -changes in vision -fast, irregular heartbeat -feeling faint or lightheaded, falls -pain, tingling, numbness in the hands or feet -seizures -swelling of the ankles, feet, hands -unusual bleeding or bruising -unusually weak or tired -vomiting Side effects that usually do not require medical attention (report to your doctor or health care professional if they continue or are bothersome): -changes in emotions or  moods -constipation -diarrhea -loss of appetite -headache -irritation at site where injected -nausea This list may not describe all possible side effects. Call your doctor for medical advice about side effects. You may report side effects to FDA at 1-800-FDA-1088. Where should I keep my medicine? This drug is given in a hospital or clinic and will not be stored at home. NOTE: This sheet is a summary. It may not cover all possible information. If you have questions about this medicine, talk to your doctor, pharmacist, or health care provider.  2014, Elsevier/Gold Standard. (2010-12-17 11:42:36)

## 2014-04-12 NOTE — Progress Notes (Signed)
Hematology and Oncology Follow Up Visit  DONIS PINDER [id number] May 08, 1961 53 y.o. 04/12/2014   Principle Diagnosis:  Kappa light chain myeloma  Current Therapy:    Status post 2 cycle of Velcade/Revlimid/Decadron  Zometa 4 mg IV every month     Interim History:  Mr.  Schwan is back for followup. She's done very well. He's had no pounds with nausea vomiting. He's had no problems with pain. He's had no issues with consultation or diarrhea. There's been no rashes. He's had no fever. He's had no leg swelling.  When we last checked his peripheral urine, his I. chain was down to 22 mg per day.  He's had no cough or shortness of breath.  Take aspirin.  Medications: Current outpatient prescriptions:amLODipine (NORVASC) 5 MG tablet, Take 5 mg by mouth daily., Disp: , Rfl: ;  aspirin 325 MG tablet, Take 325 mg by mouth daily., Disp: , Rfl: ;  atorvastatin (LIPITOR) 20 MG tablet, Take 1 tablet (20 mg total) by mouth daily., Disp: 90 tablet, Rfl: 3;  famciclovir (FAMVIR) 250 MG tablet, Take 1 tablet a day, Disp: 30 tablet, Rfl: 8 LORazepam (ATIVAN) 0.5 MG tablet, Take 1 tablet (0.5 mg total) by mouth every 6 (six) hours as needed (Nausea or vomiting)., Disp: 30 tablet, Rfl: 0;  ondansetron (ZOFRAN) 8 MG tablet, Take 1 tablet (8 mg total) by mouth 2 (two) times daily. Start the day after chemo for 2 days. Then take as needed for nausea or vomiting., Disp: 30 tablet, Rfl: 1;  polyethylene glycol (MIRALAX / GLYCOLAX) packet, Take 17 g by mouth daily., Disp: 30 each, Rfl: 6 prochlorperazine (COMPAZINE) 10 MG tablet, Take 1 tablet (10 mg total) by mouth every 6 (six) hours as needed (Nausea or vomiting)., Disp: 30 tablet, Rfl: 1;  psyllium (METAMUCIL) 58.6 % powder, Take 1 packet by mouth daily., Disp: , Rfl: ;  sildenafil (VIAGRA) 50 MG tablet, Take 1 tablet (50 mg total) by mouth daily as needed for erectile dysfunction., Disp: 10 tablet, Rfl: 2 traMADol (ULTRAM) 50 MG tablet, Take 1-2 if needed for pain  every 6 hours, Disp: 90 tablet, Rfl: 3;  lenalidomide (REVLIMID) 25 MG capsule, Take 1 capsule (25 mg total) by mouth daily. Auth # [id number], Disp: 21 capsule, Rfl: 6;  peg 3350 powder (MOVIPREP) 100 G SOLR, Take by mouth once. 04-11-14  ON HOLD, Disp: , Rfl:   Allergies: No Known Allergies  Past Medical History, Surgical history, Social history, and Family History were reviewed and updated.  Review of Systems: As above  Physical Exam:  height is 5\' 10"  (1.778 m) and weight is 196 lb (88.905 kg). His oral temperature is 97.7 F (36.5 C). His blood pressure is 123/65 and his pulse is 57. His respiration is 18.   Well-built and well-nourished ask American gentleman. Lungs are clear. Oral exam is negative for mucositis. I can exam is without scleral icterus. Neck is supple with no adenopathy. Cardiac exam regular in rhythm with no murmurs rubs or bruits. Abdomen is soft. Has good bowel sounds. There is no fluid wave. There is a palpable liver or spleen tip. Exam no tenderness over the spine ribs or hips. Extremities shows no clubbing cyanosis or edema. Skin exam no rashes ecchymosis or petechia.  Lab Results  Component Value Date   WBC 4.2 04/11/2014   HGB 14.8 04/11/2014   HCT 41.9 04/11/2014   MCV 89 04/11/2014   PLT 175 04/11/2014     Chemistry  Component Value Date/Time   NA 137 04/11/2014 0922   NA 137 02/02/2014 1557   K 3.2* 04/11/2014 0922   K 3.8 02/02/2014 1557   CL 102 04/11/2014 0922   CL 103 02/02/2014 1557   CO2 30 04/11/2014 0922   CO2 27 02/02/2014 1557   BUN 10 04/11/2014 0922   BUN 12 02/02/2014 1557   CREATININE 1.1 04/11/2014 0922   CREATININE 1.22 02/02/2014 1557      Component Value Date/Time   CALCIUM 8.9 04/11/2014 0922   CALCIUM 9.2 02/02/2014 1557   ALKPHOS 40 04/11/2014 0922   ALKPHOS 47 02/02/2014 1557   AST 20 04/11/2014 0922   AST 16 02/02/2014 1557   ALT 23 04/11/2014 0922   ALT 15 02/02/2014 1557   BILITOT 1.20 04/11/2014 0922   BILITOT 0.4 02/02/2014 1557          Impression and Plan: Mr. Caddell is 53 year old gentleman with kappa light chain myeloma. He is responding very nicely. His 24-hour urine is a good indicator of nice response.  I will plan for 4 cycles of chemotherapy and then we will go ahead and restage him with a bone marrow and other radiological tests.  We will go ahead and do his third cycle of chemotherapy. I will see him back in 1 month.   [name], MD 5/21/20152:56 PM

## 2014-04-13 LAB — PROTEIN ELECTROPHORESIS, SERUM, WITH REFLEX
ALPHA-1-GLOBULIN: 3.8 % (ref 2.9–4.9)
Albumin ELP: 62.6 % (ref 55.8–66.1)
Alpha-2-Globulin: 7.6 % (ref 7.1–11.8)
BETA 2: 3.6 % (ref 3.2–6.5)
Beta Globulin: 5.4 % (ref 4.7–7.2)
Gamma Globulin: 17 % (ref 11.1–18.8)
M-Spike, %: 0.27 g/dL
Total Protein, Serum Electrophoresis: 6.3 g/dL (ref 6.0–8.3)

## 2014-04-13 LAB — IGG, IGA, IGM
IGA: 13 mg/dL — AB (ref 68–379)
IgG (Immunoglobin G), Serum: 1220 mg/dL (ref 650–1600)
IgM, Serum: 40 mg/dL — ABNORMAL LOW (ref 41–251)

## 2014-04-13 LAB — IFE INTERPRETATION

## 2014-04-13 LAB — LACTATE DEHYDROGENASE: LDH: 149 U/L (ref 94–250)

## 2014-04-13 LAB — KAPPA/LAMBDA LIGHT CHAINS
KAPPA LAMBDA RATIO: 2.32 — AB (ref 0.26–1.65)
Kappa free light chain: 4.71 mg/dL — ABNORMAL HIGH (ref 0.33–1.94)
Lambda Free Lght Chn: 2.03 mg/dL (ref 0.57–2.63)

## 2014-04-18 ENCOUNTER — Other Ambulatory Visit (HOSPITAL_BASED_OUTPATIENT_CLINIC_OR_DEPARTMENT_OTHER): Payer: BC Managed Care – PPO | Admitting: Lab

## 2014-04-18 ENCOUNTER — Ambulatory Visit (HOSPITAL_BASED_OUTPATIENT_CLINIC_OR_DEPARTMENT_OTHER): Payer: BC Managed Care – PPO

## 2014-04-18 VITALS — BP 123/78 | HR 60 | Temp 97.2°F | Resp 18

## 2014-04-18 DIAGNOSIS — C9000 Multiple myeloma not having achieved remission: Secondary | ICD-10-CM

## 2014-04-18 DIAGNOSIS — Z5112 Encounter for antineoplastic immunotherapy: Secondary | ICD-10-CM

## 2014-04-18 LAB — CBC WITH DIFFERENTIAL (CANCER CENTER ONLY)
BASO#: 0 10*3/uL (ref 0.0–0.2)
BASO%: 0.8 % (ref 0.0–2.0)
EOS ABS: 0.3 10*3/uL (ref 0.0–0.5)
EOS%: 5.1 % (ref 0.0–7.0)
HEMATOCRIT: 40.2 % (ref 38.7–49.9)
HGB: 14.3 g/dL (ref 13.0–17.1)
LYMPH#: 1.7 10*3/uL (ref 0.9–3.3)
LYMPH%: 33.8 % (ref 14.0–48.0)
MCH: 31.6 pg (ref 28.0–33.4)
MCHC: 35.6 g/dL (ref 32.0–35.9)
MCV: 89 fL (ref 82–98)
MONO#: 0.6 10*3/uL (ref 0.1–0.9)
MONO%: 12.3 % (ref 0.0–13.0)
NEUT#: 2.4 10*3/uL (ref 1.5–6.5)
NEUT%: 48 % (ref 40.0–80.0)
Platelets: 205 10*3/uL (ref 145–400)
RBC: 4.53 10*6/uL (ref 4.20–5.70)
RDW: 13.2 % (ref 11.1–15.7)
WBC: 4.9 10*3/uL (ref 4.0–10.0)

## 2014-04-18 LAB — BASIC METABOLIC PANEL - CANCER CENTER ONLY
BUN, Bld: 12 mg/dL (ref 7–22)
CALCIUM: 8.6 mg/dL (ref 8.0–10.3)
CO2: 27 mEq/L (ref 18–33)
CREATININE: 1.1 mg/dL (ref 0.6–1.2)
Chloride: 103 mEq/L (ref 98–108)
GLUCOSE: 114 mg/dL (ref 73–118)
Potassium: 3.3 mEq/L (ref 3.3–4.7)
SODIUM: 138 meq/L (ref 128–145)

## 2014-04-18 MED ORDER — BORTEZOMIB CHEMO SQ INJECTION 3.5 MG (2.5MG/ML)
1.3000 mg/m2 | Freq: Once | INTRAMUSCULAR | Status: AC
  Administered 2014-04-18: 2.75 mg via SUBCUTANEOUS
  Filled 2014-04-18: qty 2.75

## 2014-04-18 MED ORDER — ONDANSETRON HCL 8 MG PO TABS
8.0000 mg | ORAL_TABLET | Freq: Once | ORAL | Status: AC
  Administered 2014-04-18: 8 mg via ORAL

## 2014-04-18 MED ORDER — ONDANSETRON HCL 8 MG PO TABS
ORAL_TABLET | ORAL | Status: AC
  Filled 2014-04-18: qty 1

## 2014-04-18 NOTE — Patient Instructions (Signed)

## 2014-04-25 ENCOUNTER — Ambulatory Visit (HOSPITAL_BASED_OUTPATIENT_CLINIC_OR_DEPARTMENT_OTHER): Payer: BC Managed Care – PPO

## 2014-04-25 ENCOUNTER — Other Ambulatory Visit (HOSPITAL_BASED_OUTPATIENT_CLINIC_OR_DEPARTMENT_OTHER): Payer: BC Managed Care – PPO | Admitting: Lab

## 2014-04-25 VITALS — BP 138/72 | HR 92 | Temp 97.2°F | Resp 20

## 2014-04-25 DIAGNOSIS — C9000 Multiple myeloma not having achieved remission: Secondary | ICD-10-CM

## 2014-04-25 DIAGNOSIS — Z5112 Encounter for antineoplastic immunotherapy: Secondary | ICD-10-CM

## 2014-04-25 LAB — BASIC METABOLIC PANEL - CANCER CENTER ONLY
BUN, Bld: 8 mg/dL (ref 7–22)
CO2: 26 mEq/L (ref 18–33)
Calcium: 8.4 mg/dL (ref 8.0–10.3)
Chloride: 104 mEq/L (ref 98–108)
Creat: 1.1 mg/dl (ref 0.6–1.2)
Glucose, Bld: 145 mg/dL — ABNORMAL HIGH (ref 73–118)
Potassium: 3.7 mEq/L (ref 3.3–4.7)
SODIUM: 138 meq/L (ref 128–145)

## 2014-04-25 LAB — CBC WITH DIFFERENTIAL (CANCER CENTER ONLY)
BASO#: 0.1 10*3/uL (ref 0.0–0.2)
BASO%: 1.4 % (ref 0.0–2.0)
EOS ABS: 0.2 10*3/uL (ref 0.0–0.5)
EOS%: 3.2 % (ref 0.0–7.0)
HEMATOCRIT: 40.2 % (ref 38.7–49.9)
HEMOGLOBIN: 14.3 g/dL (ref 13.0–17.1)
LYMPH#: 1.4 10*3/uL (ref 0.9–3.3)
LYMPH%: 25.8 % (ref 14.0–48.0)
MCH: 31.2 pg (ref 28.0–33.4)
MCHC: 35.6 g/dL (ref 32.0–35.9)
MCV: 88 fL (ref 82–98)
MONO#: 0.4 10*3/uL (ref 0.1–0.9)
MONO%: 7.7 % (ref 0.0–13.0)
NEUT#: 3.5 10*3/uL (ref 1.5–6.5)
NEUT%: 61.9 % (ref 40.0–80.0)
Platelets: 227 10*3/uL (ref 145–400)
RBC: 4.58 10*6/uL (ref 4.20–5.70)
RDW: 13.5 % (ref 11.1–15.7)
WBC: 5.6 10*3/uL (ref 4.0–10.0)

## 2014-04-25 MED ORDER — ONDANSETRON HCL 8 MG PO TABS
8.0000 mg | ORAL_TABLET | Freq: Once | ORAL | Status: AC
Start: 2014-04-25 — End: 2014-04-25
  Administered 2014-04-25: 8 mg via ORAL

## 2014-04-25 MED ORDER — DEXAMETHASONE 4 MG PO TABS
20.0000 mg | ORAL_TABLET | Freq: Once | ORAL | Status: AC
  Administered 2014-04-25: 20 mg via ORAL

## 2014-04-25 MED ORDER — ONDANSETRON HCL 8 MG PO TABS
ORAL_TABLET | ORAL | Status: AC
  Filled 2014-04-25: qty 1

## 2014-04-25 MED ORDER — BORTEZOMIB CHEMO SQ INJECTION 3.5 MG (2.5MG/ML)
1.3000 mg/m2 | Freq: Once | INTRAMUSCULAR | Status: AC
  Administered 2014-04-25: 2.75 mg via SUBCUTANEOUS
  Filled 2014-04-25: qty 2.75

## 2014-04-25 NOTE — Patient Instructions (Signed)

## 2014-05-07 ENCOUNTER — Other Ambulatory Visit: Payer: BC Managed Care – PPO | Admitting: Lab

## 2014-05-07 ENCOUNTER — Other Ambulatory Visit: Payer: Self-pay | Admitting: Nurse Practitioner

## 2014-05-07 DIAGNOSIS — C9000 Multiple myeloma not having achieved remission: Secondary | ICD-10-CM

## 2014-05-07 DIAGNOSIS — N289 Disorder of kidney and ureter, unspecified: Secondary | ICD-10-CM

## 2014-05-07 MED ORDER — LENALIDOMIDE 25 MG PO CAPS: 25.0000 mg | ORAL_CAPSULE | Freq: Every day | ORAL | Status: DC

## 2014-05-09 ENCOUNTER — Telehealth: Payer: Self-pay | Admitting: Hematology & Oncology

## 2014-05-09 ENCOUNTER — Ambulatory Visit (HOSPITAL_BASED_OUTPATIENT_CLINIC_OR_DEPARTMENT_OTHER): Payer: BC Managed Care – PPO | Admitting: Lab

## 2014-05-09 ENCOUNTER — Ambulatory Visit (HOSPITAL_BASED_OUTPATIENT_CLINIC_OR_DEPARTMENT_OTHER): Payer: BC Managed Care – PPO

## 2014-05-09 ENCOUNTER — Ambulatory Visit (HOSPITAL_BASED_OUTPATIENT_CLINIC_OR_DEPARTMENT_OTHER): Payer: BC Managed Care – PPO | Admitting: Hematology & Oncology

## 2014-05-09 ENCOUNTER — Telehealth: Payer: Self-pay

## 2014-05-09 ENCOUNTER — Encounter: Payer: Self-pay | Admitting: Hematology & Oncology

## 2014-05-09 VITALS — BP 123/66 | HR 54 | Temp 97.9°F | Resp 18 | Ht 70.0 in | Wt 198.0 lb

## 2014-05-09 DIAGNOSIS — C9000 Multiple myeloma not having achieved remission: Secondary | ICD-10-CM

## 2014-05-09 DIAGNOSIS — Z5112 Encounter for antineoplastic immunotherapy: Secondary | ICD-10-CM

## 2014-05-09 LAB — CMP (CANCER CENTER ONLY)
ALT: 33 U/L (ref 10–47)
AST: 25 U/L (ref 11–38)
Albumin: 3.6 g/dL (ref 3.3–5.5)
Alkaline Phosphatase: 55 U/L (ref 26–84)
BILIRUBIN TOTAL: 0.9 mg/dL (ref 0.20–1.60)
BUN, Bld: 10 mg/dL (ref 7–22)
CALCIUM: 8.9 mg/dL (ref 8.0–10.3)
CHLORIDE: 101 meq/L (ref 98–108)
CO2: 27 meq/L (ref 18–33)
CREATININE: 1.1 mg/dL (ref 0.6–1.2)
Glucose, Bld: 124 mg/dL — ABNORMAL HIGH (ref 73–118)
Potassium: 3.5 mEq/L (ref 3.3–4.7)
Sodium: 136 mEq/L (ref 128–145)
Total Protein: 6.6 g/dL (ref 6.4–8.1)

## 2014-05-09 LAB — UIFE/LIGHT CHAINS/TP QN, 24-HR UR
ALBUMIN, U: DETECTED
ALPHA 1 UR: DETECTED — AB
ALPHA 2 UR: DETECTED — AB
Beta, Urine: DETECTED — AB
FREE KAPPA/LAMBDA RATIO: 21.94 ratio — AB (ref 2.04–10.37)
FREE LT CHN EXCR RATE: 41.03 mg/d
Free Kappa Lt Chains,Ur: 3.73 mg/dL — ABNORMAL HIGH (ref 0.14–2.42)
Free Lambda Excretion/Day: 1.87 mg/d
Free Lambda Lt Chains,Ur: 0.17 mg/dL (ref 0.02–0.67)
Gamma Globulin, Urine: DETECTED — AB
TOTAL PROTEIN, URINE-UPE24: 4.6 mg/dL
TOTAL PROTEIN, URINE-UR/DAY: 51 mg/d (ref 10–140)
Time: 24 hours
Volume, Urine: 1100 mL

## 2014-05-09 LAB — CBC WITH DIFFERENTIAL (CANCER CENTER ONLY)
BASO#: 0 10*3/uL (ref 0.0–0.2)
BASO%: 0.9 % (ref 0.0–2.0)
EOS%: 5.7 % (ref 0.0–7.0)
Eosinophils Absolute: 0.3 10*3/uL (ref 0.0–0.5)
HCT: 39.9 % (ref 38.7–49.9)
HGB: 14.3 g/dL (ref 13.0–17.1)
LYMPH#: 1.3 10*3/uL (ref 0.9–3.3)
LYMPH%: 29.1 % (ref 14.0–48.0)
MCH: 31.8 pg (ref 28.0–33.4)
MCHC: 35.8 g/dL (ref 32.0–35.9)
MCV: 89 fL (ref 82–98)
MONO#: 0.7 10*3/uL (ref 0.1–0.9)
MONO%: 14.3 % — ABNORMAL HIGH (ref 0.0–13.0)
NEUT#: 2.3 10*3/uL (ref 1.5–6.5)
NEUT%: 50 % (ref 40.0–80.0)
Platelets: 158 10*3/uL (ref 145–400)
RBC: 4.5 10*6/uL (ref 4.20–5.70)
RDW: 13.2 % (ref 11.1–15.7)
WBC: 4.6 10*3/uL (ref 4.0–10.0)

## 2014-05-09 MED ORDER — ONDANSETRON HCL 8 MG PO TABS
8.0000 mg | ORAL_TABLET | Freq: Once | ORAL | Status: AC
  Administered 2014-05-09: 8 mg via ORAL

## 2014-05-09 MED ORDER — ONDANSETRON HCL 8 MG PO TABS
ORAL_TABLET | ORAL | Status: AC
  Filled 2014-05-09: qty 1

## 2014-05-09 MED ORDER — BORTEZOMIB CHEMO SQ INJECTION 3.5 MG (2.5MG/ML)
1.3000 mg/m2 | Freq: Once | INTRAMUSCULAR | Status: AC
  Administered 2014-05-09: 2.75 mg via SUBCUTANEOUS
  Filled 2014-05-09: qty 2.75

## 2014-05-09 NOTE — Progress Notes (Signed)
Hematology and Oncology Follow Up Visit  Jimmy Day [id number] [birth year] 53 y.o. 05/09/2014   Principle Diagnosis:  Kappa light chain myeloma  Current Therapy:    Status post 3 cycle of Velcade/Revlimid/Decadron  Zometa 4 mg IV every month     Interim History:  Mr.  Jimmy Day is back for followup. He has Kappa light chain myeloma. He's done well with therapy so far. He's working. He is not having any pain. He's had no problems with fevers sweats or chills. He's had no change in bowel or bladder habits. He's had no rashes.    His last myeloma studies showed a Kappa chain of 4.71 mg/dL.  He's had no fatigue. He's had no headache.  Overall, his performance status is ECOG 0     Medications: Current outpatient prescriptions:amLODipine (NORVASC) 5 MG tablet, Take 5 mg by mouth daily., Disp: , Rfl: ;  aspirin 325 MG tablet, Take 325 mg by mouth daily., Disp: , Rfl: ;  atorvastatin (LIPITOR) 20 MG tablet, Take 1 tablet (20 mg total) by mouth daily., Disp: 90 tablet, Rfl: 3;  famciclovir (FAMVIR) 250 MG tablet, Take 1 tablet a day, Disp: 30 tablet, Rfl: 8 lenalidomide (REVLIMID) 25 MG capsule, Take 1 capsule (25 mg total) by mouth daily. Jimmy Day #[id number], Disp: 21 capsule, Rfl: 6;  LORazepam (ATIVAN) 0.5 MG tablet, Take 1 tablet (0.5 mg total) by mouth every 6 (six) hours as needed (Nausea or vomiting)., Disp: 30 tablet, Rfl: 0 ondansetron (ZOFRAN) 8 MG tablet, Take 1 tablet (8 mg total) by mouth 2 (two) times daily. Start the day after chemo for 2 days. Then take as needed for nausea or vomiting., Disp: 30 tablet, Rfl: 1;  polyethylene glycol (MIRALAX / GLYCOLAX) packet, Take 17 g by mouth daily., Disp: 30 each, Rfl: 6 prochlorperazine (COMPAZINE) 10 MG tablet, Take 1 tablet (10 mg total) by mouth every 6 (six) hours as needed (Nausea or vomiting)., Disp: 30 tablet, Rfl: 1;  psyllium (METAMUCIL) 58.6 % powder, Take 1 packet by mouth daily., Disp: , Rfl: ;  sildenafil (VIAGRA) 50 MG tablet, Take 1  tablet (50 mg total) by mouth daily as needed for erectile dysfunction., Disp: 10 tablet, Rfl: 2 traMADol (ULTRAM) 50 MG tablet, Take 1-2 if needed for pain every 6 hours, Disp: 90 tablet, Rfl: 3;  peg 3350 powder (MOVIPREP) 100 G SOLR, Take by mouth once. 04-11-14  ON HOLD, Disp: , Rfl:   Allergies: No Known Allergies  Past Medical History, Surgical history, Social history, and Family History were reviewed and updated.  Review of Systems: As above  Physical Exam:  height is 5\' 10"  (1.778 m) and weight is 198 lb (89.812 kg). His oral temperature is 97.9 F (36.6 C). His blood pressure is 123/66 and his pulse is 54. His respiration is 18.   Well-developed and well-nourished African American gentleman. Head and neck exam shows no ocular or oral lesions. He has no palpable cervical or supraclavicular lymph nodes. Lungs are clear. Cardiac exam regular rate and rhythm with no murmurs rubs or bruits. Abdomen is soft. He has good bowel sounds. There is no fluid wave there is no palpable liver or spleen tip. Back exam shows no tenderness over the spine ribs or hips. Extremities shows no clubbing cyanosis or edema. Neurological exam shows no focal neurological deficits. Skin exam no rashes.  Lab Results  Component Value Date   WBC 4.6 05/09/2014   HGB 14.3 05/09/2014   HCT 39.9 05/09/2014   MCV 89  05/09/2014   PLT 158 05/09/2014     Chemistry      Component Value Date/Time   NA 136 05/09/2014 0913   NA 137 02/02/2014 1557   K 3.5 05/09/2014 0913   K 3.8 02/02/2014 1557   CL 101 05/09/2014 0913   CL 103 02/02/2014 1557   CO2 27 05/09/2014 0913   CO2 27 02/02/2014 1557   BUN 10 05/09/2014 0913   BUN 12 02/02/2014 1557   CREATININE 1.1 05/09/2014 0913   CREATININE 1.22 02/02/2014 1557      Component Value Date/Time   CALCIUM 8.9 05/09/2014 0913   CALCIUM 9.2 02/02/2014 1557   ALKPHOS 55 05/09/2014 0913   ALKPHOS 47 02/02/2014 1557   AST 25 05/09/2014 0913   AST 16 02/02/2014 1557   ALT 33 05/09/2014 0913    ALT 15 02/02/2014 1557   BILITOT 0.90 05/09/2014 0913   BILITOT 0.4 02/02/2014 1557         Impression and Plan: [name] is a 53 year old African American gentleman with Kappa light chain myeloma. He has had 3 cycles of chemotherapy today. His myeloma dies have been improving.  I want to go ahead every station after his fourth cycle of treatment. I want to get another bone marrow test on him. I want to repeat his 24-hour urine. I want to get x-rays.  I still am considering getting him to stem cell transplant. He has light chain disease which can tend to be more aggressive. He's done well. He said very little toxicity. I think he would do okay with this oh cell transplant.  We will get his bone marrow test set up for one month.  We'll go ahead with his chemotherapy. He will start his Revlimid next week.  I will plan to get him back to see me about a week after the bone marrow test is done.   [name], MD 6/17/20151:33 PM

## 2014-05-09 NOTE — Telephone Encounter (Signed)
[facility] [place] - [facility]  Kappa light chain myeloma - Primary 203.00  [id number] PR NORMAL SALINE SOLUTION INFUS  [id number] PR ONDANSETRON ORAL  [id number] PR ORAL DEXAMETHASONE  [id number] PR ZOLEDRONIC ACID 1MG   [id number] PR BORTEZOMIB INJECTION

## 2014-05-09 NOTE — Patient Instructions (Signed)

## 2014-05-09 NOTE — Telephone Encounter (Signed)
[facility] scheduled with Tammy at [facility] for 8am on 06/19/14. Message left for flow cytometry to call our office and verify they are available. dph

## 2014-05-11 ENCOUNTER — Telehealth: Payer: Self-pay | Admitting: *Deleted

## 2014-05-11 LAB — IFE INTERPRETATION

## 2014-05-11 LAB — PROTEIN ELECTROPHORESIS, SERUM, WITH REFLEX
Albumin ELP: 62.8 % (ref 55.8–66.1)
Alpha-1-Globulin: 4.4 % (ref 2.9–4.9)
Alpha-2-Globulin: 8.9 % (ref 7.1–11.8)
BETA 2: 3 % — AB (ref 3.2–6.5)
Beta Globulin: 5.3 % (ref 4.7–7.2)
Gamma Globulin: 15.6 % (ref 11.1–18.8)
M-Spike, %: 0.22 g/dL
Total Protein, Serum Electrophoresis: 6.4 g/dL (ref 6.0–8.3)

## 2014-05-11 LAB — KAPPA/LAMBDA LIGHT CHAINS
KAPPA FREE LGHT CHN: 5.15 mg/dL — AB (ref 0.33–1.94)
KAPPA LAMBDA RATIO: 2.46 — AB (ref 0.26–1.65)
Lambda Free Lght Chn: 2.09 mg/dL (ref 0.57–2.63)

## 2014-05-11 LAB — IGG, IGA, IGM
IGG (IMMUNOGLOBIN G), SERUM: 1000 mg/dL (ref 650–1600)
IGM, SERUM: 31 mg/dL — AB (ref 41–251)
IgA: 15 mg/dL — ABNORMAL LOW (ref 68–379)

## 2014-05-11 LAB — LACTATE DEHYDROGENASE: LDH: 155 U/L (ref 94–250)

## 2014-05-11 NOTE — Telephone Encounter (Addendum)
Message copied by Lenn Sink on Fri May 11, 2014  9:35 AM ------      Message from: Burney Gauze R      Created: Thu May 10, 2014  7:12 PM       Call - urine shows myeloma level is still low!!  Laurey Arrow ------Informed pt that urine shows myeloma level is still low

## 2014-05-15 ENCOUNTER — Other Ambulatory Visit: Payer: Self-pay | Admitting: Nurse Practitioner

## 2014-05-15 DIAGNOSIS — C9000 Multiple myeloma not having achieved remission: Secondary | ICD-10-CM

## 2014-05-16 ENCOUNTER — Other Ambulatory Visit (HOSPITAL_BASED_OUTPATIENT_CLINIC_OR_DEPARTMENT_OTHER): Payer: BC Managed Care – PPO | Admitting: Lab

## 2014-05-16 ENCOUNTER — Ambulatory Visit: Payer: BC Managed Care – PPO

## 2014-05-16 ENCOUNTER — Ambulatory Visit (HOSPITAL_BASED_OUTPATIENT_CLINIC_OR_DEPARTMENT_OTHER): Payer: BC Managed Care – PPO

## 2014-05-16 ENCOUNTER — Other Ambulatory Visit: Payer: BC Managed Care – PPO | Admitting: Lab

## 2014-05-16 ENCOUNTER — Encounter: Payer: Self-pay | Admitting: *Deleted

## 2014-05-16 DIAGNOSIS — C9000 Multiple myeloma not having achieved remission: Secondary | ICD-10-CM

## 2014-05-16 DIAGNOSIS — Z5112 Encounter for antineoplastic immunotherapy: Secondary | ICD-10-CM

## 2014-05-16 LAB — CBC WITH DIFFERENTIAL (CANCER CENTER ONLY)
BASO#: 0 10*3/uL (ref 0.0–0.2)
BASO%: 0.3 % (ref 0.0–2.0)
EOS%: 5.4 % (ref 0.0–7.0)
Eosinophils Absolute: 0.2 10*3/uL (ref 0.0–0.5)
HEMATOCRIT: 40.6 % (ref 38.7–49.9)
HGB: 14.4 g/dL (ref 13.0–17.1)
LYMPH#: 1.3 10*3/uL (ref 0.9–3.3)
LYMPH%: 39.9 % (ref 14.0–48.0)
MCH: 31.1 pg (ref 28.0–33.4)
MCHC: 35.5 g/dL (ref 32.0–35.9)
MCV: 88 fL (ref 82–98)
MONO#: 0.5 10*3/uL (ref 0.1–0.9)
MONO%: 13.7 % — ABNORMAL HIGH (ref 0.0–13.0)
NEUT%: 40.7 % (ref 40.0–80.0)
NEUTROS ABS: 1.4 10*3/uL — AB (ref 1.5–6.5)
Platelets: 173 10*3/uL (ref 145–400)
RBC: 4.63 10*6/uL (ref 4.20–5.70)
RDW: 13.7 % (ref 11.1–15.7)
WBC: 3.4 10*3/uL — ABNORMAL LOW (ref 4.0–10.0)

## 2014-05-16 LAB — CMP (CANCER CENTER ONLY)
ALBUMIN: 3.6 g/dL (ref 3.3–5.5)
ALT: 40 U/L (ref 10–47)
AST: 31 U/L (ref 11–38)
Alkaline Phosphatase: 41 U/L (ref 26–84)
BUN, Bld: 8 mg/dL (ref 7–22)
CO2: 27 meq/L (ref 18–33)
Calcium: 8.2 mg/dL (ref 8.0–10.3)
Chloride: 105 mEq/L (ref 98–108)
Creat: 0.8 mg/dl (ref 0.6–1.2)
Glucose, Bld: 132 mg/dL — ABNORMAL HIGH (ref 73–118)
POTASSIUM: 3.5 meq/L (ref 3.3–4.7)
Sodium: 137 mEq/L (ref 128–145)
TOTAL PROTEIN: 6.3 g/dL — AB (ref 6.4–8.1)
Total Bilirubin: 1.3 mg/dl (ref 0.20–1.60)

## 2014-05-16 MED ORDER — ZOLEDRONIC ACID 4 MG/100ML IV SOLN
4.0000 mg | Freq: Once | INTRAVENOUS | Status: AC
  Administered 2014-05-16: 4 mg via INTRAVENOUS
  Filled 2014-05-16: qty 100

## 2014-05-16 MED ORDER — ONDANSETRON HCL 8 MG PO TABS
8.0000 mg | ORAL_TABLET | Freq: Once | ORAL | Status: AC
  Administered 2014-05-16: 8 mg via ORAL

## 2014-05-16 MED ORDER — ONDANSETRON HCL 8 MG PO TABS
ORAL_TABLET | ORAL | Status: AC
  Filled 2014-05-16: qty 1

## 2014-05-16 MED ORDER — BORTEZOMIB CHEMO SQ INJECTION 3.5 MG (2.5MG/ML)
1.3000 mg/m2 | Freq: Once | INTRAMUSCULAR | Status: AC
  Administered 2014-05-16: 2.75 mg via SUBCUTANEOUS
  Filled 2014-05-16: qty 2.75

## 2014-05-16 MED ORDER — SODIUM CHLORIDE 0.9 % IV SOLN
Freq: Once | INTRAVENOUS | Status: AC
  Administered 2014-05-16: 10:00:00 via INTRAVENOUS

## 2014-05-16 NOTE — Patient Instructions (Signed)
[facility] Discharge Instructions for Patients Receiving Chemotherapy  Today you received the following chemotherapy agents Zometa and Velcade.  To help prevent nausea and vomiting after your treatment, we encourage you to take your nausea medication as prescribed.    If you develop nausea and vomiting that is not controlled by your nausea medication, call the clinic.   BELOW ARE SYMPTOMS THAT SHOULD BE REPORTED IMMEDIATELY:  *FEVER GREATER THAN 100.5 F  *CHILLS WITH OR WITHOUT FEVER  NAUSEA AND VOMITING THAT IS NOT CONTROLLED WITH YOUR NAUSEA MEDICATION  *UNUSUAL SHORTNESS OF BREATH  *UNUSUAL BRUISING OR BLEEDING  TENDERNESS IN MOUTH AND THROAT WITH OR WITHOUT PRESENCE OF ULCERS  *URINARY PROBLEMS  *BOWEL PROBLEMS  UNUSUAL RASH Items with * indicate a potential emergency and should be followed up as soon as possible.  Feel free to call the clinic you have any questions or concerns. The clinic phone number is [phone].  Zoledronic Acid injection (Hypercalcemia, Oncology) What is this medicine? ZOLEDRONIC ACID (ZOE le dron ik AS id) lowers the amount of calcium loss from bone. It is used to treat too much calcium in your blood from cancer. It is also used to prevent complications of cancer that has spread to the bone. This medicine may be used for other purposes; ask your health care provider or pharmacist if you have questions. COMMON BRAND NAME(S): Zometa What should I tell my health care provider before I take this medicine? They need to know if you have any of these conditions: -aspirin-sensitive asthma -cancer, especially if you are receiving medicines used to treat cancer -dental disease or wear dentures -infection -kidney disease -receiving corticosteroids like dexamethasone or prednisone -an unusual or allergic reaction to zoledronic acid, other medicines, foods, dyes, or preservatives -pregnant or trying to get pregnant -breast-feeding How  should I use this medicine? This medicine is for infusion into a vein. It is given by a health care professional in a hospital or clinic setting. Talk to your pediatrician regarding the use of this medicine in children. Special care may be needed. Overdosage: If you think you have taken too much of this medicine contact a poison control center or emergency room at once. NOTE: This medicine is only for you. Do not share this medicine with others. What if I miss a dose? It is important not to miss your dose. Call your doctor or health care professional if you are unable to keep an appointment. What may interact with this medicine? -certain antibiotics given by injection -NSAIDs, medicines for pain and inflammation, like ibuprofen or naproxen -some diuretics like bumetanide, furosemide -teriparatide -thalidomide This list may not describe all possible interactions. Give your health care provider a list of all the medicines, herbs, non-prescription drugs, or dietary supplements you use. Also tell them if you smoke, drink alcohol, or use illegal drugs. Some items may interact with your medicine. What should I watch for while using this medicine? Visit your doctor or health care professional for regular checkups. It may be some time before you see the benefit from this medicine. Do not stop taking your medicine unless your doctor tells you to. Your doctor may order blood tests or other tests to see how you are doing. Women should inform their doctor if they wish to become pregnant or think they might be pregnant. There is a potential for serious side effects to an unborn child. Talk to your health care professional or pharmacist for more information. You should make sure that  you get enough calcium and vitamin D while you are taking this medicine. Discuss the foods you eat and the vitamins you take with your health care professional. Some people who take this medicine have severe bone, joint, and/or  muscle pain. This medicine may also increase your risk for jaw problems or a broken thigh bone. Tell your doctor right away if you have severe pain in your jaw, bones, joints, or muscles. Tell your doctor if you have any pain that does not go away or that gets worse. Tell your dentist and dental surgeon that you are taking this medicine. You should not have major dental surgery while on this medicine. See your dentist to have a dental exam and fix any dental problems before starting this medicine. Take good care of your teeth while on this medicine. Make sure you see your dentist for regular follow-up appointments. What side effects may I notice from receiving this medicine? Side effects that you should report to your doctor or health care professional as soon as possible: -allergic reactions like skin rash, itching or hives, swelling of the face, lips, or tongue -anxiety, confusion, or depression -breathing problems -changes in vision -eye pain -feeling faint or lightheaded, falls -jaw pain, especially after dental work -mouth sores -muscle cramps, stiffness, or weakness -trouble passing urine or change in the amount of urine Side effects that usually do not require medical attention (report to your doctor or health care professional if they continue or are bothersome): -bone, joint, or muscle pain -constipation -diarrhea -fever -hair loss -irritation at site where injected -loss of appetite -nausea, vomiting -stomach upset -trouble sleeping -trouble swallowing -weak or tired This list may not describe all possible side effects. Call your doctor for medical advice about side effects. You may report side effects to FDA at 1-800-FDA-1088. Where should I keep my medicine? This drug is given in a hospital or clinic and will not be stored at home. NOTE: This sheet is a summary. It may not cover all possible information. If you have questions about this medicine, talk to your doctor,  pharmacist, or health care provider.  2015, [employer]. (2013-04-20 13:03:13)

## 2014-05-22 ENCOUNTER — Other Ambulatory Visit: Payer: Self-pay | Admitting: *Deleted

## 2014-05-22 DIAGNOSIS — C9000 Multiple myeloma not having achieved remission: Secondary | ICD-10-CM

## 2014-05-23 ENCOUNTER — Ambulatory Visit: Payer: BC Managed Care – PPO | Admitting: Hematology & Oncology

## 2014-05-23 ENCOUNTER — Ambulatory Visit: Payer: BC Managed Care – PPO

## 2014-05-23 ENCOUNTER — Other Ambulatory Visit: Payer: BC Managed Care – PPO | Admitting: Lab

## 2014-05-23 ENCOUNTER — Ambulatory Visit (HOSPITAL_BASED_OUTPATIENT_CLINIC_OR_DEPARTMENT_OTHER): Payer: BC Managed Care – PPO | Admitting: Lab

## 2014-05-23 ENCOUNTER — Ambulatory Visit (HOSPITAL_BASED_OUTPATIENT_CLINIC_OR_DEPARTMENT_OTHER): Payer: BC Managed Care – PPO

## 2014-05-23 VITALS — BP 124/82 | HR 74 | Temp 97.0°F | Resp 16 | Wt 199.0 lb

## 2014-05-23 DIAGNOSIS — C9000 Multiple myeloma not having achieved remission: Secondary | ICD-10-CM

## 2014-05-23 DIAGNOSIS — Z5112 Encounter for antineoplastic immunotherapy: Secondary | ICD-10-CM

## 2014-05-23 LAB — CBC WITH DIFFERENTIAL (CANCER CENTER ONLY)
BASO#: 0.1 10*3/uL (ref 0.0–0.2)
BASO%: 1.1 % (ref 0.0–2.0)
EOS ABS: 0.2 10*3/uL (ref 0.0–0.5)
EOS%: 5 % (ref 0.0–7.0)
HCT: 38.9 % (ref 38.7–49.9)
HGB: 13.9 g/dL (ref 13.0–17.1)
LYMPH#: 1.3 10*3/uL (ref 0.9–3.3)
LYMPH%: 29.6 % (ref 14.0–48.0)
MCH: 31.9 pg (ref 28.0–33.4)
MCHC: 35.7 g/dL (ref 32.0–35.9)
MCV: 89 fL (ref 82–98)
MONO#: 0.8 10*3/uL (ref 0.1–0.9)
MONO%: 17 % — AB (ref 0.0–13.0)
NEUT#: 2.1 10*3/uL (ref 1.5–6.5)
NEUT%: 47.3 % (ref 40.0–80.0)
Platelets: 227 10*3/uL (ref 145–400)
RBC: 4.36 10*6/uL (ref 4.20–5.70)
RDW: 13.6 % (ref 11.1–15.7)
WBC: 4.4 10*3/uL (ref 4.0–10.0)

## 2014-05-23 LAB — CMP (CANCER CENTER ONLY)
ALT(SGPT): 21 U/L (ref 10–47)
AST: 17 U/L (ref 11–38)
Albumin: 3.9 g/dL (ref 3.3–5.5)
Alkaline Phosphatase: 34 U/L (ref 26–84)
BUN, Bld: 9 mg/dL (ref 7–22)
CHLORIDE: 100 meq/L (ref 98–108)
CO2: 28 meq/L (ref 18–33)
CREATININE: 1.1 mg/dL (ref 0.6–1.2)
Calcium: 8.4 mg/dL (ref 8.0–10.3)
Glucose, Bld: 115 mg/dL (ref 73–118)
Potassium: 3.5 mEq/L (ref 3.3–4.7)
Sodium: 140 mEq/L (ref 128–145)
Total Bilirubin: 1 mg/dl (ref 0.20–1.60)
Total Protein: 6.7 g/dL (ref 6.4–8.1)

## 2014-05-23 MED ORDER — ONDANSETRON HCL 8 MG PO TABS
ORAL_TABLET | ORAL | Status: AC
  Filled 2014-05-23: qty 1

## 2014-05-23 MED ORDER — ONDANSETRON HCL 8 MG PO TABS
8.0000 mg | ORAL_TABLET | Freq: Once | ORAL | Status: AC
  Administered 2014-05-23: 8 mg via ORAL

## 2014-05-23 MED ORDER — BORTEZOMIB CHEMO SQ INJECTION 3.5 MG (2.5MG/ML)
1.3000 mg/m2 | Freq: Once | INTRAMUSCULAR | Status: AC
  Administered 2014-05-23: 2.75 mg via SUBCUTANEOUS
  Filled 2014-05-23: qty 2.75

## 2014-05-23 NOTE — Patient Instructions (Signed)
[facility]charge [facility] for [facility] Receiving [facility]otherapy  Today you received the following chemotherapy agents Velcade.  To help prevent nausea and vomiting after your treatment, we encourage you to take your nausea medication as directed.    If you develop nausea and vomiting that is not controlled by your nausea medication, call the clinic.   BELOW ARE SYMPTOMS THAT SHOULD BE REPORTED IMMEDIATELY:  *FEVER GREATER THAN 100.5 F  *CHILLS WITH OR WITHOUT FEVER  NAUSEA AND VOMITING THAT IS NOT CONTROLLED WITH YOUR NAUSEA MEDICATION  *UNUSUAL SHORTNESS OF BREATH  *UNUSUAL BRUISING OR BLEEDING  TENDERNESS IN MOUTH AND THROAT WITH OR WITHOUT PRESENCE OF ULCERS  *URINARY PROBLEMS  *BOWEL PROBLEMS  UNUSUAL RASH Items with * indicate a potential emergency and should be followed up as soon as possible.  Feel free to call the clinic you have any questions or concerns. The clinic phone number is [phone].    

## 2014-05-28 LAB — PROTEIN ELECTROPHORESIS, SERUM, WITH REFLEX
ALBUMIN ELP: 59.6 % (ref 55.8–66.1)
ALPHA-1-GLOBULIN: 5 % — AB (ref 2.9–4.9)
Alpha-2-Globulin: 9.6 % (ref 7.1–11.8)
BETA GLOBULIN: 5.1 % (ref 4.7–7.2)
Beta 2: 4.2 % (ref 3.2–6.5)
Gamma Globulin: 16.5 % (ref 11.1–18.8)
M-Spike, %: 0.2 g/dL
Total Protein, Serum Electrophoresis: 6.5 g/dL (ref 6.0–8.3)

## 2014-05-28 LAB — IGG, IGA, IGM
IgA: 13 mg/dL — ABNORMAL LOW (ref 68–379)
IgG (Immunoglobin G), Serum: 1110 mg/dL (ref 650–1600)
IgM, Serum: 27 mg/dL — ABNORMAL LOW (ref 41–251)

## 2014-05-28 LAB — IFE INTERPRETATION

## 2014-06-06 ENCOUNTER — Other Ambulatory Visit (HOSPITAL_BASED_OUTPATIENT_CLINIC_OR_DEPARTMENT_OTHER): Payer: BC Managed Care – PPO

## 2014-06-06 ENCOUNTER — Ambulatory Visit: Payer: BC Managed Care – PPO

## 2014-06-06 ENCOUNTER — Other Ambulatory Visit: Payer: BC Managed Care – PPO | Admitting: Lab

## 2014-06-07 ENCOUNTER — Ambulatory Visit (HOSPITAL_BASED_OUTPATIENT_CLINIC_OR_DEPARTMENT_OTHER): Payer: BC Managed Care – PPO

## 2014-06-07 ENCOUNTER — Ambulatory Visit (HOSPITAL_BASED_OUTPATIENT_CLINIC_OR_DEPARTMENT_OTHER)
Admission: RE | Admit: 2014-06-07 | Discharge: 2014-06-07 | Disposition: A | Discharge status: Home / Self Care | Payer: BC Managed Care – PPO | Source: Ambulatory Visit | Attending: Hematology & Oncology | Admitting: Hematology & Oncology

## 2014-06-07 ENCOUNTER — Ambulatory Visit (HOSPITAL_BASED_OUTPATIENT_CLINIC_OR_DEPARTMENT_OTHER): Payer: BC Managed Care – PPO | Admitting: Lab

## 2014-06-07 VITALS — BP 127/77 | HR 58 | Temp 97.1°F | Resp 16

## 2014-06-07 DIAGNOSIS — C9000 Multiple myeloma not having achieved remission: Secondary | ICD-10-CM

## 2014-06-07 DIAGNOSIS — M5030 Other cervical disc degeneration, unspecified cervical region: Secondary | ICD-10-CM | POA: Insufficient documentation

## 2014-06-07 DIAGNOSIS — Z5112 Encounter for antineoplastic immunotherapy: Secondary | ICD-10-CM

## 2014-06-07 LAB — CMP (CANCER CENTER ONLY)
ALBUMIN: 3.7 g/dL (ref 3.3–5.5)
ALT: 25 U/L (ref 10–47)
AST: 23 U/L (ref 11–38)
Alkaline Phosphatase: 33 U/L (ref 26–84)
BUN, Bld: 9 mg/dL (ref 7–22)
CALCIUM: 8.5 mg/dL (ref 8.0–10.3)
CO2: 28 meq/L (ref 18–33)
Chloride: 103 mEq/L (ref 98–108)
Creat: 1.1 mg/dl (ref 0.6–1.2)
Glucose, Bld: 103 mg/dL (ref 73–118)
POTASSIUM: 3.1 meq/L — AB (ref 3.3–4.7)
Sodium: 140 mEq/L (ref 128–145)
Total Bilirubin: 0.9 mg/dl (ref 0.20–1.60)
Total Protein: 6.7 g/dL (ref 6.4–8.1)

## 2014-06-07 LAB — CBC WITH DIFFERENTIAL (CANCER CENTER ONLY)
BASO#: 0.1 10*3/uL (ref 0.0–0.2)
BASO%: 1.7 % (ref 0.0–2.0)
EOS%: 7.6 % — ABNORMAL HIGH (ref 0.0–7.0)
Eosinophils Absolute: 0.4 10*3/uL (ref 0.0–0.5)
HCT: 39.9 % (ref 38.7–49.9)
HGB: 14.3 g/dL (ref 13.0–17.1)
LYMPH#: 1.3 10*3/uL (ref 0.9–3.3)
LYMPH%: 27.4 % (ref 14.0–48.0)
MCH: 31.8 pg (ref 28.0–33.4)
MCHC: 35.8 g/dL (ref 32.0–35.9)
MCV: 89 fL (ref 82–98)
MONO#: 0.7 10*3/uL (ref 0.1–0.9)
MONO%: 15.1 % — ABNORMAL HIGH (ref 0.0–13.0)
NEUT#: 2.3 10*3/uL (ref 1.5–6.5)
NEUT%: 48.2 % (ref 40.0–80.0)
Platelets: 173 10*3/uL (ref 145–400)
RBC: 4.49 10*6/uL (ref 4.20–5.70)
RDW: 13.6 % (ref 11.1–15.7)
WBC: 4.7 10*3/uL (ref 4.0–10.0)

## 2014-06-07 MED ORDER — ONDANSETRON HCL 8 MG PO TABS
ORAL_TABLET | ORAL | Status: AC
  Filled 2014-06-07: qty 1

## 2014-06-07 MED ORDER — BORTEZOMIB CHEMO SQ INJECTION 3.5 MG (2.5MG/ML)
1.3000 mg/m2 | Freq: Once | INTRAMUSCULAR | Status: AC
  Administered 2014-06-07: 2.75 mg via SUBCUTANEOUS
  Filled 2014-06-07: qty 2.75

## 2014-06-07 MED ORDER — ONDANSETRON HCL 8 MG PO TABS
8.0000 mg | ORAL_TABLET | Freq: Once | ORAL | Status: AC
  Administered 2014-06-07: 8 mg via ORAL

## 2014-06-07 NOTE — Patient Instructions (Signed)
Bortezomib injection What is this medicine? BORTEZOMIB (bor TEZ oh mib) is a chemotherapy drug. It slows the growth of cancer cells. This medicine is used to treat multiple myeloma, and certain lymphomas, such as mantle-cell lymphoma. This medicine may be used for other purposes; ask your health care provider or pharmacist if you have questions. COMMON BRAND NAME(S): Velcade What should I tell my health care provider before I take this medicine? They need to know if you have any of these conditions: -diabetes -heart disease -irregular heartbeat -liver disease -on hemodialysis -low blood counts, like low white blood cells, platelets, or hemoglobin -peripheral neuropathy -taking medicine for blood pressure -an unusual or allergic reaction to bortezomib, mannitol, boron, other medicines, foods, dyes, or preservatives -pregnant or trying to get pregnant -breast-feeding How should I use this medicine? This medicine is for injection into a vein or for injection under the skin. It is given by a health care professional in a hospital or clinic setting. Talk to your pediatrician regarding the use of this medicine in children. Special care may be needed. Overdosage: If you think you have taken too much of this medicine contact a poison control center or emergency room at once. NOTE: This medicine is only for you. Do not share this medicine with others. What if I miss a dose? It is important not to miss your dose. Call your doctor or health care professional if you are unable to keep an appointment. What may interact with this medicine? This medicine may interact with the following medications: -ketoconazole -rifampin -ritonavir -St. John's Wort This list may not describe all possible interactions. Give your health care provider a list of all the medicines, herbs, non-prescription drugs, or dietary supplements you use. Also tell them if you smoke, drink alcohol, or use illegal drugs. Some items  may interact with your medicine. What should I watch for while using this medicine? Visit your doctor for checks on your progress. This drug may make you feel generally unwell. This is not uncommon, as chemotherapy can affect healthy cells as well as cancer cells. Report any side effects. Continue your course of treatment even though you feel ill unless your doctor tells you to stop. You may get drowsy or dizzy. Do not drive, use machinery, or do anything that needs mental alertness until you know how this medicine affects you. Do not stand or sit up quickly, especially if you are an older patient. This reduces the risk of dizzy or fainting spells. In some cases, you may be given additional medicines to help with side effects. Follow all directions for their use. Call your doctor or health care professional for advice if you get a fever, chills or sore throat, or other symptoms of a cold or flu. Do not treat yourself. This drug decreases your body's ability to fight infections. Try to avoid being around people who are sick. This medicine may increase your risk to bruise or bleed. Call your doctor or health care professional if you notice any unusual bleeding. You may need blood work done while you are taking this medicine. In some patients, this medicine may cause a serious brain infection that may cause death. If you have any problems seeing, thinking, speaking, walking, or standing, tell your doctor right away. If you cannot reach your doctor, urgently seek other source of medical care. Do not become pregnant while taking this medicine. Women should inform their doctor if they wish to become pregnant or think they might be pregnant. There is   a potential for serious side effects to an unborn child. Talk to your health care professional or pharmacist for more information. Do not breast-feed an infant while taking this medicine. Check with your doctor or health care professional if you get an attack of  severe diarrhea, nausea and vomiting, or if you sweat a lot. The loss of too much body fluid can make it dangerous for you to take this medicine. What side effects may I notice from receiving this medicine? Side effects that you should report to your doctor or health care professional as soon as possible: -allergic reactions like skin rash, itching or hives, swelling of the face, lips, or tongue -breathing problems -changes in hearing -changes in vision -fast, irregular heartbeat -feeling faint or lightheaded, falls -pain, tingling, numbness in the hands or feet -right upper belly pain -seizures -swelling of the ankles, feet, hands -unusual bleeding or bruising -unusually weak or tired -vomiting -yellowing of the eyes or skin Side effects that usually do not require medical attention (report to your doctor or health care professional if they continue or are bothersome): -changes in emotions or moods -constipation -diarrhea -loss of appetite -headache -irritation at site where injected -nausea This list may not describe all possible side effects. Call your doctor for medical advice about side effects. You may report side effects to FDA at 1-800-FDA-1088. Where should I keep my medicine? This drug is given in a hospital or clinic and will not be stored at home. NOTE: This sheet is a summary. It may not cover all possible information. If you have questions about this medicine, talk to your doctor, pharmacist, or health care provider.  2015, [employer]. (2013-09-04 12:46:32)  

## 2014-06-08 ENCOUNTER — Encounter: Payer: Self-pay | Admitting: *Deleted

## 2014-06-11 LAB — PROTEIN ELECTROPHORESIS, SERUM, WITH REFLEX
ALPHA-2-GLOBULIN: 7.8 % (ref 7.1–11.8)
Albumin ELP: 62.2 % (ref 55.8–66.1)
Alpha-1-Globulin: 4.2 % (ref 2.9–4.9)
BETA 2: 3.7 % (ref 3.2–6.5)
Beta Globulin: 5.1 % (ref 4.7–7.2)
GAMMA GLOBULIN: 17 % (ref 11.1–18.8)
M-SPIKE, %: 0.2 g/dL
Total Protein, Serum Electrophoresis: 6.4 g/dL (ref 6.0–8.3)

## 2014-06-11 LAB — KAPPA/LAMBDA LIGHT CHAINS
Kappa free light chain: 2.95 mg/dL — ABNORMAL HIGH (ref 0.33–1.94)
Kappa:Lambda Ratio: 1.24 (ref 0.26–1.65)
Lambda Free Lght Chn: 2.38 mg/dL (ref 0.57–2.63)

## 2014-06-11 LAB — IGG, IGA, IGM
IgA: 18 mg/dL — ABNORMAL LOW (ref 68–379)
IgG (Immunoglobin G), Serum: 1200 mg/dL (ref 650–1600)
IgM, Serum: 35 mg/dL — ABNORMAL LOW (ref 41–251)

## 2014-06-11 LAB — LACTATE DEHYDROGENASE: LDH: 151 U/L (ref 94–250)

## 2014-06-11 LAB — BETA 2 MICROGLOBULIN, SERUM: Beta-2 Microglobulin: 1.73 mg/L (ref <=2.51)

## 2014-06-11 LAB — IFE INTERPRETATION

## 2014-06-12 ENCOUNTER — Other Ambulatory Visit: Payer: BC Managed Care – PPO | Admitting: Lab

## 2014-06-12 ENCOUNTER — Other Ambulatory Visit: Payer: Self-pay | Admitting: *Deleted

## 2014-06-12 DIAGNOSIS — C9000 Multiple myeloma not having achieved remission: Secondary | ICD-10-CM

## 2014-06-13 ENCOUNTER — Ambulatory Visit: Payer: BC Managed Care – PPO

## 2014-06-13 ENCOUNTER — Telehealth: Payer: Self-pay | Admitting: Hematology & Oncology

## 2014-06-13 ENCOUNTER — Other Ambulatory Visit: Payer: BC Managed Care – PPO | Admitting: Lab

## 2014-06-13 NOTE — Telephone Encounter (Signed)
Left message for pt to keep per MD all other appointments that are scheduled

## 2014-06-14 ENCOUNTER — Encounter (HOSPITAL_COMMUNITY): Payer: Self-pay | Admitting: Pharmacy Technician

## 2014-06-14 LAB — UIFE/LIGHT CHAINS/TP QN, 24-HR UR
ALPHA 2 UR: DETECTED — AB
Albumin, U: DETECTED
Alpha 1, Urine: DETECTED — AB
BETA UR: DETECTED — AB
FREE KAPPA/LAMBDA RATIO: 14.79 ratio — AB (ref 2.04–10.37)
Free Kappa Lt Chains,Ur: 4.29 mg/dL — ABNORMAL HIGH (ref 0.14–2.42)
Free Lambda Excretion/Day: 2.32 mg/d
Free Lambda Lt Chains,Ur: 0.29 mg/dL (ref 0.02–0.67)
Free Lt Chn Excr Rate: 34.32 mg/d
Gamma Globulin, Urine: DETECTED — AB
TOTAL PROTEIN, URINE-UPE24: 5.8 mg/dL
TOTAL PROTEIN, URINE-UR/DAY: 46 mg/d (ref 10–140)
Time: 24 hours
Volume, Urine: 800 mL

## 2014-06-19 ENCOUNTER — Encounter (HOSPITAL_COMMUNITY): Payer: Self-pay

## 2014-06-19 ENCOUNTER — Ambulatory Visit (HOSPITAL_COMMUNITY)
Admission: RE | Admit: 2014-06-19 | Discharge: 2014-06-19 | Disposition: A | Discharge status: Home / Self Care | Payer: BC Managed Care – PPO | Source: Ambulatory Visit | Attending: Hematology & Oncology | Admitting: Hematology & Oncology

## 2014-06-19 VITALS — BP 122/71 | HR 71 | Temp 97.6°F | Resp 16 | Ht 71.0 in | Wt 198.0 lb

## 2014-06-19 DIAGNOSIS — C9000 Multiple myeloma not having achieved remission: Secondary | ICD-10-CM | POA: Insufficient documentation

## 2014-06-19 DIAGNOSIS — D72822 Plasmacytosis: Secondary | ICD-10-CM | POA: Insufficient documentation

## 2014-06-19 LAB — CBC
HCT: 40.5 % (ref 39.0–52.0)
Hemoglobin: 14.3 g/dL (ref 13.0–17.0)
MCH: 31.1 pg (ref 26.0–34.0)
MCHC: 35.3 g/dL (ref 30.0–36.0)
MCV: 88 fL (ref 78.0–100.0)
PLATELETS: 194 10*3/uL (ref 150–400)
RBC: 4.6 MIL/uL (ref 4.22–5.81)
RDW: 14.2 % (ref 11.5–15.5)
WBC: 4.3 10*3/uL (ref 4.0–10.5)

## 2014-06-19 LAB — BONE MARROW EXAM

## 2014-06-19 MED ORDER — MEPERIDINE HCL 50 MG/ML IJ SOLN
50.0000 mg | Freq: Once | INTRAMUSCULAR | Status: DC
  Filled 2014-06-19: qty 1

## 2014-06-19 MED ORDER — MIDAZOLAM HCL 5 MG/5ML IJ SOLN
INTRAMUSCULAR | Status: AC | PRN
  Administered 2014-06-19: 1 mg via INTRAVENOUS
  Administered 2014-06-19: 2 mg via INTRAVENOUS
  Administered 2014-06-19: 0.5 mg via INTRAVENOUS

## 2014-06-19 MED ORDER — MIDAZOLAM HCL 10 MG/2ML IJ SOLN
10.0000 mg | Freq: Once | INTRAMUSCULAR | Status: DC
  Filled 2014-06-19: qty 2

## 2014-06-19 MED ORDER — MEPERIDINE HCL 25 MG/ML IJ SOLN
INTRAMUSCULAR | Status: AC | PRN
  Administered 2014-06-19 (×2): 25 mg via INTRAVENOUS

## 2014-06-19 MED ORDER — SODIUM CHLORIDE 0.9 % IV SOLN
INTRAVENOUS | Status: DC
  Administered 2014-06-19: 07:00:00 via INTRAVENOUS

## 2014-06-19 NOTE — Discharge Instructions (Signed)

## 2014-06-19 NOTE — Sedation Documentation (Signed)
Patient is resting comfortably. 

## 2014-06-19 NOTE — Sedation Documentation (Signed)
Procedure complete. Patient rolled back to supine position

## 2014-06-19 NOTE — Sedation Documentation (Signed)
Family updated as to patient's status.

## 2014-06-19 NOTE — Sedation Documentation (Signed)
MD at bedside. 

## 2014-06-19 NOTE — Progress Notes (Signed)
This is a bone marrow biopsy and aspirate note for Mr. Henslee. He was brought to the short stay unit at [facility]. We did the appropriate time out procedure on him.  His ASA class was 1. His Mallimpati score was 1.  He was placed onto his right side. He had an IV placed peripherally. He received a total of 3.5 mg of Versed and 50 mg of Demerol for IV sedation.  The left posterior iliac crest region was prepped and draped in sterile fashion. 5 cc of 1% lidocaine was infiltrated under the skin down into the periosteum.  A scalpel was used to make an incision and to the skin.  We used a bone marrow aspirate needle. We obtained 2 Bomar aspirate without difficulty. We then used the Jamshidi biopsy needle. We got an excellent biopsy core.  We cleaned and dressed the procedure site sterilely.  He tolerated the procedure well without any complications.  Laurey Arrow

## 2014-06-19 NOTE — Sedation Documentation (Signed)
Bx site C, D I

## 2014-06-20 ENCOUNTER — Other Ambulatory Visit (HOSPITAL_BASED_OUTPATIENT_CLINIC_OR_DEPARTMENT_OTHER): Payer: BC Managed Care – PPO | Admitting: Lab

## 2014-06-20 ENCOUNTER — Ambulatory Visit (HOSPITAL_BASED_OUTPATIENT_CLINIC_OR_DEPARTMENT_OTHER): Payer: BC Managed Care – PPO

## 2014-06-20 VITALS — BP 132/81 | HR 67 | Temp 97.9°F | Resp 18 | Ht 71.0 in | Wt 198.0 lb

## 2014-06-20 DIAGNOSIS — C9000 Multiple myeloma not having achieved remission: Secondary | ICD-10-CM

## 2014-06-20 DIAGNOSIS — Z5112 Encounter for antineoplastic immunotherapy: Secondary | ICD-10-CM

## 2014-06-20 LAB — CMP (CANCER CENTER ONLY)
ALK PHOS: 33 U/L (ref 26–84)
ALT(SGPT): 20 U/L (ref 10–47)
AST: 20 U/L (ref 11–38)
Albumin: 4.1 g/dL (ref 3.3–5.5)
BUN, Bld: 9 mg/dL (ref 7–22)
CO2: 27 mEq/L (ref 18–33)
Calcium: 8.3 mg/dL (ref 8.0–10.3)
Chloride: 103 mEq/L (ref 98–108)
Creat: 1.2 mg/dl (ref 0.6–1.2)
Glucose, Bld: 99 mg/dL (ref 73–118)
Potassium: 3.6 mEq/L (ref 3.3–4.7)
SODIUM: 139 meq/L (ref 128–145)
TOTAL PROTEIN: 7.1 g/dL (ref 6.4–8.1)
Total Bilirubin: 1 mg/dl (ref 0.20–1.60)

## 2014-06-20 LAB — CBC WITH DIFFERENTIAL (CANCER CENTER ONLY)
BASO#: 0.1 10*3/uL (ref 0.0–0.2)
BASO%: 1.7 % (ref 0.0–2.0)
EOS ABS: 0.2 10*3/uL (ref 0.0–0.5)
EOS%: 5.6 % (ref 0.0–7.0)
HCT: 40.5 % (ref 38.7–49.9)
HGB: 14.6 g/dL (ref 13.0–17.1)
LYMPH#: 1.5 10*3/uL (ref 0.9–3.3)
LYMPH%: 35.2 % (ref 14.0–48.0)
MCH: 31.6 pg (ref 28.0–33.4)
MCHC: 36 g/dL — ABNORMAL HIGH (ref 32.0–35.9)
MCV: 88 fL (ref 82–98)
MONO#: 0.5 10*3/uL (ref 0.1–0.9)
MONO%: 12.9 % (ref 0.0–13.0)
NEUT#: 1.8 10*3/uL (ref 1.5–6.5)
NEUT%: 44.6 % (ref 40.0–80.0)
PLATELETS: 203 10*3/uL (ref 145–400)
RBC: 4.62 10*6/uL (ref 4.20–5.70)
RDW: 13.5 % (ref 11.1–15.7)
WBC: 4.1 10*3/uL (ref 4.0–10.0)

## 2014-06-20 MED ORDER — ZOLEDRONIC ACID 4 MG/100ML IV SOLN
4.0000 mg | Freq: Once | INTRAVENOUS | Status: AC
  Administered 2014-06-20: 4 mg via INTRAVENOUS
  Filled 2014-06-20: qty 100

## 2014-06-20 MED ORDER — BORTEZOMIB CHEMO SQ INJECTION 3.5 MG (2.5MG/ML)
1.3000 mg/m2 | Freq: Once | INTRAMUSCULAR | Status: AC
  Administered 2014-06-20: 2.75 mg via SUBCUTANEOUS
  Filled 2014-06-20: qty 2.75

## 2014-06-20 MED ORDER — SODIUM CHLORIDE 0.9 % IV SOLN
Freq: Once | INTRAVENOUS | Status: AC
  Administered 2014-06-20: 10:00:00 via INTRAVENOUS

## 2014-06-20 MED ORDER — ONDANSETRON HCL 8 MG PO TABS
8.0000 mg | ORAL_TABLET | Freq: Once | ORAL | Status: AC
  Administered 2014-06-20: 8 mg via ORAL

## 2014-06-20 MED ORDER — ONDANSETRON HCL 8 MG PO TABS
ORAL_TABLET | ORAL | Status: AC
  Filled 2014-06-20: qty 1

## 2014-06-20 NOTE — Patient Instructions (Signed)
[facility] Discharge Instructions for Patients Receiving Chemotherapy  Today you received the following chemotherapy agents Velcade  To help prevent nausea and vomiting after your treatment, we encourage you to take your nausea medication.   If you develop nausea and vomiting that is not controlled by your nausea medication, call the clinic.   BELOW ARE SYMPTOMS THAT SHOULD BE REPORTED IMMEDIATELY:  *FEVER GREATER THAN 100.5 F  *CHILLS WITH OR WITHOUT FEVER  NAUSEA AND VOMITING THAT IS NOT CONTROLLED WITH YOUR NAUSEA MEDICATION  *UNUSUAL SHORTNESS OF BREATH  *UNUSUAL BRUISING OR BLEEDING  TENDERNESS IN MOUTH AND THROAT WITH OR WITHOUT PRESENCE OF ULCERS  *URINARY PROBLEMS  *BOWEL PROBLEMS  UNUSUAL RASH Items with * indicate a potential emergency and should be followed up as soon as possible.  Feel free to call the clinic you have any questions or concerns. The clinic phone number is [phone].   Bortezomib injection What is this medicine? BORTEZOMIB (bor TEZ oh mib) is a chemotherapy drug. It slows the growth of cancer cells. This medicine is used to treat multiple myeloma, and certain lymphomas, such as mantle-cell lymphoma. This medicine may be used for other purposes; ask your health care provider or pharmacist if you have questions. COMMON BRAND NAME(S): Velcade What should I tell my health care provider before I take this medicine? They need to know if you have any of these conditions: -diabetes -heart disease -irregular heartbeat -liver disease -on hemodialysis -low blood counts, like low white blood cells, platelets, or hemoglobin -peripheral neuropathy -taking medicine for blood pressure -an unusual or allergic reaction to bortezomib, mannitol, boron, other medicines, foods, dyes, or preservatives -pregnant or trying to get pregnant -breast-feeding How should I use this medicine? This medicine is for injection into a vein or for  injection under the skin. It is given by a health care professional in a hospital or clinic setting. Talk to your pediatrician regarding the use of this medicine in children. Special care may be needed. Overdosage: If you think you have taken too much of this medicine contact a poison control center or emergency room at once. NOTE: This medicine is only for you. Do not share this medicine with others. What if I miss a dose? It is important not to miss your dose. Call your doctor or health care professional if you are unable to keep an appointment. What may interact with this medicine? This medicine may interact with the following medications: -ketoconazole -rifampin -ritonavir -St. John's Wort This list may not describe all possible interactions. Give your health care provider a list of all the medicines, herbs, non-prescription drugs, or dietary supplements you use. Also tell them if you smoke, drink alcohol, or use illegal drugs. Some items may interact with your medicine. What should I watch for while using this medicine? Visit your doctor for checks on your progress. This drug may make you feel generally unwell. This is not uncommon, as chemotherapy can affect healthy cells as well as cancer cells. Report any side effects. Continue your course of treatment even though you feel ill unless your doctor tells you to stop. You may get drowsy or dizzy. Do not drive, use machinery, or do anything that needs mental alertness until you know how this medicine affects you. Do not stand or sit up quickly, especially if you are an older patient. This reduces the risk of dizzy or fainting spells. In some cases, you may be given additional medicines to help with side effects. Follow   all directions for their use. Call your doctor or health care professional for advice if you get a fever, chills or sore throat, or other symptoms of a cold or flu. Do not treat yourself. This drug decreases your body's ability to  fight infections. Try to avoid being around people who are sick. This medicine may increase your risk to bruise or bleed. Call your doctor or health care professional if you notice any unusual bleeding. You may need blood work done while you are taking this medicine. In some patients, this medicine may cause a serious brain infection that may cause death. If you have any problems seeing, thinking, speaking, walking, or standing, tell your doctor right away. If you cannot reach your doctor, urgently seek other source of medical care. Do not become pregnant while taking this medicine. Women should inform their doctor if they wish to become pregnant or think they might be pregnant. There is a potential for serious side effects to an unborn child. Talk to your health care professional or pharmacist for more information. Do not breast-feed an infant while taking this medicine. Check with your doctor or health care professional if you get an attack of severe diarrhea, nausea and vomiting, or if you sweat a lot. The loss of too much body fluid can make it dangerous for you to take this medicine. What side effects may I notice from receiving this medicine? Side effects that you should report to your doctor or health care professional as soon as possible: -allergic reactions like skin rash, itching or hives, swelling of the face, lips, or tongue -breathing problems -changes in hearing -changes in vision -fast, irregular heartbeat -feeling faint or lightheaded, falls -pain, tingling, numbness in the hands or feet -right upper belly pain -seizures -swelling of the ankles, feet, hands -unusual bleeding or bruising -unusually weak or tired -vomiting -yellowing of the eyes or skin Side effects that usually do not require medical attention (report to your doctor or health care professional if they continue or are bothersome): -changes in emotions or moods -constipation -diarrhea -loss of  appetite -headache -irritation at site where injected -nausea This list may not describe all possible side effects. Call your doctor for medical advice about side effects. You may report side effects to FDA at 1-800-FDA-1088. Where should I keep my medicine? This drug is given in a hospital or clinic and will not be stored at home. NOTE: This sheet is a summary. It may not cover all possible information. If you have questions about this medicine, talk to your doctor, pharmacist, or health care provider.  2015, Elsevier/Gold Standard. (2013-09-04 12:46:32) Zoledronic Acid injection (Hypercalcemia, Oncology) What is this medicine? ZOLEDRONIC ACID (ZOE le dron ik AS id) lowers the amount of calcium loss from bone. It is used to treat too much calcium in your blood from cancer. It is also used to prevent complications of cancer that has spread to the bone. This medicine may be used for other purposes; ask your health care provider or pharmacist if you have questions. COMMON BRAND NAME(S): Zometa What should I tell my health care provider before I take this medicine? They need to know if you have any of these conditions: -aspirin-sensitive asthma -cancer, especially if you are receiving medicines used to treat cancer -dental disease or wear dentures -infection -kidney disease -receiving corticosteroids like dexamethasone or prednisone -an unusual or allergic reaction to zoledronic acid, other medicines, foods, dyes, or preservatives -pregnant or trying to get pregnant -breast-feeding How should I   use this medicine? This medicine is for infusion into a vein. It is given by a health care professional in a hospital or clinic setting. Talk to your pediatrician regarding the use of this medicine in children. Special care may be needed. Overdosage: If you think you have taken too much of this medicine contact a poison control center or emergency room at once. NOTE: This medicine is only for you.  Do not share this medicine with others. What if I miss a dose? It is important not to miss your dose. Call your doctor or health care professional if you are unable to keep an appointment. What may interact with this medicine? -certain antibiotics given by injection -NSAIDs, medicines for pain and inflammation, like ibuprofen or naproxen -some diuretics like bumetanide, furosemide -teriparatide -thalidomide This list may not describe all possible interactions. Give your health care provider a list of all the medicines, herbs, non-prescription drugs, or dietary supplements you use. Also tell them if you smoke, drink alcohol, or use illegal drugs. Some items may interact with your medicine. What should I watch for while using this medicine? Visit your doctor or health care professional for regular checkups. It may be some time before you see the benefit from this medicine. Do not stop taking your medicine unless your doctor tells you to. Your doctor may order blood tests or other tests to see how you are doing. Women should inform their doctor if they wish to become pregnant or think they might be pregnant. There is a potential for serious side effects to an unborn child. Talk to your health care professional or pharmacist for more information. You should make sure that you get enough calcium and vitamin D while you are taking this medicine. Discuss the foods you eat and the vitamins you take with your health care professional. Some people who take this medicine have severe bone, joint, and/or muscle pain. This medicine may also increase your risk for jaw problems or a broken thigh bone. Tell your doctor right away if you have severe pain in your jaw, bones, joints, or muscles. Tell your doctor if you have any pain that does not go away or that gets worse. Tell your dentist and dental surgeon that you are taking this medicine. You should not have major dental surgery while on this medicine. See your  dentist to have a dental exam and fix any dental problems before starting this medicine. Take good care of your teeth while on this medicine. Make sure you see your dentist for regular follow-up appointments. What side effects may I notice from receiving this medicine? Side effects that you should report to your doctor or health care professional as soon as possible: -allergic reactions like skin rash, itching or hives, swelling of the face, lips, or tongue -anxiety, confusion, or depression -breathing problems -changes in vision -eye pain -feeling faint or lightheaded, falls -jaw pain, especially after dental work -mouth sores -muscle cramps, stiffness, or weakness -trouble passing urine or change in the amount of urine Side effects that usually do not require medical attention (report to your doctor or health care professional if they continue or are bothersome): -bone, joint, or muscle pain -constipation -diarrhea -fever -hair loss -irritation at site where injected -loss of appetite -nausea, vomiting -stomach upset -trouble sleeping -trouble swallowing -weak or tired This list may not describe all possible side effects. Call your doctor for medical advice about side effects. You may report side effects to FDA at 1-800-FDA-1088. Where should I   keep my medicine? This drug is given in a hospital or clinic and will not be stored at home. NOTE: This sheet is a summary. It may not cover all possible information. If you have questions about this medicine, talk to your doctor, pharmacist, or health care provider.  2015, Elsevier/Gold Standard. (2013-04-20 13:03:13)

## 2014-06-22 ENCOUNTER — Encounter: Payer: Self-pay | Admitting: Hematology & Oncology

## 2014-06-22 LAB — PROTEIN ELECTROPHORESIS, SERUM, WITH REFLEX
ALBUMIN ELP: 62.6 % (ref 55.8–66.1)
ALPHA-1-GLOBULIN: 3.8 % (ref 2.9–4.9)
ALPHA-2-GLOBULIN: 7.6 % (ref 7.1–11.8)
BETA GLOBULIN: 5 % (ref 4.7–7.2)
Beta 2: 3.5 % (ref 3.2–6.5)
Gamma Globulin: 17.5 % (ref 11.1–18.8)
M-Spike, %: 0.27 g/dL
TOTAL PROTEIN, SERUM ELECTROPHOR: 6.9 g/dL (ref 6.0–8.3)

## 2014-06-22 LAB — KAPPA/LAMBDA LIGHT CHAINS
KAPPA FREE LGHT CHN: 3.74 mg/dL — AB (ref 0.33–1.94)
Kappa:Lambda Ratio: 1.88 — ABNORMAL HIGH (ref 0.26–1.65)
Lambda Free Lght Chn: 1.99 mg/dL (ref 0.57–2.63)

## 2014-06-22 LAB — IFE INTERPRETATION

## 2014-06-22 LAB — IGG, IGA, IGM
IgA: 16 mg/dL — ABNORMAL LOW (ref 68–379)
IgG (Immunoglobin G), Serum: 1240 mg/dL (ref 650–1600)
IgM, Serum: 32 mg/dL — ABNORMAL LOW (ref 41–251)

## 2014-06-25 ENCOUNTER — Encounter: Payer: Self-pay | Admitting: Hematology & Oncology

## 2014-06-25 ENCOUNTER — Ambulatory Visit (HOSPITAL_BASED_OUTPATIENT_CLINIC_OR_DEPARTMENT_OTHER): Payer: BC Managed Care – PPO | Admitting: Hematology & Oncology

## 2014-06-25 VITALS — BP 131/81 | HR 54 | Temp 97.9°F | Resp 16

## 2014-06-25 DIAGNOSIS — K5901 Slow transit constipation: Secondary | ICD-10-CM

## 2014-06-25 DIAGNOSIS — N521 Erectile dysfunction due to diseases classified elsewhere: Secondary | ICD-10-CM

## 2014-06-25 DIAGNOSIS — E291 Testicular hypofunction: Secondary | ICD-10-CM

## 2014-06-25 DIAGNOSIS — C9000 Multiple myeloma not having achieved remission: Secondary | ICD-10-CM

## 2014-06-25 MED ORDER — LENALIDOMIDE 25 MG PO CAPS: 25.0000 mg | ORAL_CAPSULE | Freq: Every day | ORAL | Status: DC

## 2014-06-25 MED ORDER — SILDENAFIL CITRATE 100 MG PO TABS: 100.0000 mg | ORAL_TABLET | Freq: Every day | ORAL | Status: DC | PRN

## 2014-06-25 MED ORDER — LACTULOSE 20 GM/30ML PO SOLN: 30.0000 mL | Freq: Three times a day (TID) | ORAL | Status: DC

## 2014-06-25 NOTE — Progress Notes (Signed)
Hematology and Oncology Follow Up Visit  [facility] [id number] Dec 15, 1960 53 y.o. 06/25/2014   Principle Diagnosis:  IgG Kappa myeloma  Current Therapy:    Status post 4 cycles of Velcade/Revlimid/Decadron  Zometa 4 mg IV every month     Interim History:  Jimmy Day is in for followup. He had his bone marrow test done on July 28. The pathology report ([id number]) showed a normocellular marrow with 5% plasma cells. His myeloma panel on FISH were normal. I do not have the cytogenetics back yet.  We did his bone survey. He had multiple small lytic lesions. Everything appeared to be pretty much stable.  He feels well. He's had no problems with pain. He's working.  His last 24-hour urine only showed 34 mg of light chain in his urine.  His last serum light chain was 3.74 mg/dL.  He's had no fever. He's had no rashes. He's able constipated. Has some hemorrhoid issues. I gave him some lactulose to try.  His overall performance status is ECOG 0  Medications: Current outpatient prescriptions:amLODipine (NORVASC) 5 MG tablet, Take 5 mg by mouth every morning. , Disp: , Rfl: ;  aspirin 325 MG tablet, Take 325 mg by mouth daily., Disp: , Rfl: ;  atorvastatin (LIPITOR) 20 MG tablet, Take 1 tablet (20 mg total) by mouth daily., Disp: 90 tablet, Rfl: 3;  famciclovir (FAMVIR) 250 MG tablet, Take 250 mg by mouth daily., Disp: , Rfl:  psyllium (METAMUCIL) 58.6 % powder, Take 1 packet by mouth daily., Disp: , Rfl: ;  sildenafil (VIAGRA) 100 MG tablet, Take 1 tablet (100 mg total) by mouth daily as needed for erectile dysfunction., Disp: 10 tablet, Rfl: 2;  Lactulose 20 GM/30ML SOLN, Take 30 mLs (20 g total) by mouth every 8 (eight) hours., Disp: 960 mL, Rfl: 2;  lenalidomide (REVLIMID) 25 MG capsule, Take 1 capsule (25 mg total) by mouth daily., Disp: 21 capsule, Rfl: 4  Allergies: No Known Allergies  Past Medical History, Surgical history, Social history, and Family History were reviewed and  updated.  Review of Systems: As above  Physical Exam:  oral temperature is 97.9 F (36.6 C). His blood pressure is 131/81 and his pulse is 54. His respiration is 16.   Well-developed and well-nourished African American gentleman. Head and neck exam shows no ocular or oral lesions. He has no palpable cervical or supraclavicular fizzed. Lungs are clear. Cardiac exam regular in rhythm with no murmurs rubs or bruits. Abdomen is soft. Has good bowel sounds. There is no fluid wave. There is no palpable liver or spleen tip. Back exam shows no tenderness over the spine ribs or hips. Extremities shows no clubbing cyanosis or edema. Neurological exam is nonfocal.  Lab Results  Component Value Date   WBC 4.1 06/20/2014   HGB 14.6 06/20/2014   HCT 40.5 06/20/2014   MCV 88 06/20/2014   PLT 203 06/20/2014     Chemistry      Component Value Date/Time   NA 139 06/20/2014 0921   NA 137 02/02/2014 1557   K 3.6 06/20/2014 0921   K 3.8 02/02/2014 1557   CL 103 06/20/2014 0921   CL 103 02/02/2014 1557   CO2 27 06/20/2014 0921   CO2 27 02/02/2014 1557   BUN 9 06/20/2014 0921   BUN 12 02/02/2014 1557   CREATININE 1.2 06/20/2014 0921   CREATININE 1.22 02/02/2014 1557      Component Value Date/Time   CALCIUM 8.3 06/20/2014 0921  CALCIUM 9.2 02/02/2014 1557   ALKPHOS 33 06/20/2014 0921   ALKPHOS 47 02/02/2014 1557   AST 20 06/20/2014 0921   AST 16 02/02/2014 1557   ALT 20 06/20/2014 0921   ALT 15 02/02/2014 1557   BILITOT 1.00 06/20/2014 0921   BILITOT 0.4 02/02/2014 1557         Impression and Plan: [name] is 53 year old gentleman with IgG kappa myeloma. Would is an unusual is that we first saw him, and we did not find any obvious heavy chain although he did have decreased IgA and IgM levels. He now has detectable heavy chain.  I think that he has responded. His light chains have come down quite nicely. His bone marrow shows only 5% plasma cells. We got a very good sample.  I think that we probably should  consider a stem cell transplant for him. He is young and healthy. I think he would do well with a transplant and did a prolonged remission.  We will refer back to [facility].  In the interim, we will continue his Revlimid and Velcade.  We will start the Velcade next week again.  He does get Zometa with Velcade each month.  I'll see him back in September.   [name], MD 8/3/20156:04 PM

## 2014-06-26 ENCOUNTER — Telehealth: Payer: Self-pay | Admitting: Hematology & Oncology

## 2014-06-26 ENCOUNTER — Ambulatory Visit: Payer: BC Managed Care – PPO | Admitting: Hematology & Oncology

## 2014-06-26 NOTE — Telephone Encounter (Signed)
Pt in ofc today to pick up completed FMLA papers for his wife Rod Holler).         CONSENT COPY SCANNED

## 2014-06-27 LAB — CHROMOSOME ANALYSIS, BONE MARROW

## 2014-06-27 LAB — TISSUE HYBRIDIZATION (BONE MARROW)-NCBH

## 2014-07-04 ENCOUNTER — Ambulatory Visit: Payer: BC Managed Care – PPO

## 2014-07-04 ENCOUNTER — Other Ambulatory Visit: Payer: BC Managed Care – PPO | Admitting: Lab

## 2014-07-06 ENCOUNTER — Other Ambulatory Visit (HOSPITAL_BASED_OUTPATIENT_CLINIC_OR_DEPARTMENT_OTHER): Payer: BC Managed Care – PPO | Admitting: Lab

## 2014-07-06 ENCOUNTER — Other Ambulatory Visit: Payer: Self-pay

## 2014-07-06 ENCOUNTER — Ambulatory Visit: Payer: BC Managed Care – PPO

## 2014-07-06 ENCOUNTER — Ambulatory Visit (HOSPITAL_BASED_OUTPATIENT_CLINIC_OR_DEPARTMENT_OTHER): Payer: BC Managed Care – PPO

## 2014-07-06 ENCOUNTER — Other Ambulatory Visit: Payer: BC Managed Care – PPO | Admitting: Lab

## 2014-07-06 VITALS — BP 124/65 | HR 68 | Temp 97.0°F | Resp 16

## 2014-07-06 DIAGNOSIS — K5901 Slow transit constipation: Secondary | ICD-10-CM

## 2014-07-06 DIAGNOSIS — E291 Testicular hypofunction: Secondary | ICD-10-CM

## 2014-07-06 DIAGNOSIS — N521 Erectile dysfunction due to diseases classified elsewhere: Secondary | ICD-10-CM

## 2014-07-06 DIAGNOSIS — C9000 Multiple myeloma not having achieved remission: Secondary | ICD-10-CM

## 2014-07-06 DIAGNOSIS — Z5112 Encounter for antineoplastic immunotherapy: Secondary | ICD-10-CM

## 2014-07-06 LAB — CBC WITH DIFFERENTIAL (CANCER CENTER ONLY)
BASO#: 0 10*3/uL (ref 0.0–0.2)
BASO%: 0.6 % (ref 0.0–2.0)
EOS%: 6.4 % (ref 0.0–7.0)
Eosinophils Absolute: 0.3 10*3/uL (ref 0.0–0.5)
HCT: 39 % (ref 38.7–49.9)
HGB: 14.2 g/dL (ref 13.0–17.1)
LYMPH#: 1.4 10*3/uL (ref 0.9–3.3)
LYMPH%: 28.9 % (ref 14.0–48.0)
MCH: 32 pg (ref 28.0–33.4)
MCHC: 36.4 g/dL — ABNORMAL HIGH (ref 32.0–35.9)
MCV: 88 fL (ref 82–98)
MONO#: 0.6 10*3/uL (ref 0.1–0.9)
MONO%: 12.3 % (ref 0.0–13.0)
NEUT%: 51.8 % (ref 40.0–80.0)
NEUTROS ABS: 2.4 10*3/uL (ref 1.5–6.5)
PLATELETS: 161 10*3/uL (ref 145–400)
RBC: 4.44 10*6/uL (ref 4.20–5.70)
RDW: 13.3 % (ref 11.1–15.7)
WBC: 4.7 10*3/uL (ref 4.0–10.0)

## 2014-07-06 LAB — CMP (CANCER CENTER ONLY)
ALBUMIN: 3.7 g/dL (ref 3.3–5.5)
ALT: 16 U/L (ref 10–47)
AST: 16 U/L (ref 11–38)
Alkaline Phosphatase: 37 U/L (ref 26–84)
BUN, Bld: 9 mg/dL (ref 7–22)
CALCIUM: 8.2 mg/dL (ref 8.0–10.3)
CHLORIDE: 102 meq/L (ref 98–108)
CO2: 25 mEq/L (ref 18–33)
Creat: 1.1 mg/dl (ref 0.6–1.2)
Glucose, Bld: 108 mg/dL (ref 73–118)
POTASSIUM: 3.8 meq/L (ref 3.3–4.7)
Sodium: 135 mEq/L (ref 128–145)
Total Bilirubin: 1 mg/dl (ref 0.20–1.60)
Total Protein: 6.5 g/dL (ref 6.4–8.1)

## 2014-07-06 MED ORDER — LENALIDOMIDE 25 MG PO CAPS: 25.0000 mg | ORAL_CAPSULE | Freq: Every day | ORAL | Status: DC

## 2014-07-06 MED ORDER — BORTEZOMIB CHEMO SQ INJECTION 3.5 MG (2.5MG/ML)
1.3000 mg/m2 | Freq: Once | INTRAMUSCULAR | Status: AC
  Administered 2014-07-06: 2.75 mg via SUBCUTANEOUS
  Filled 2014-07-06: qty 2.75

## 2014-07-06 MED ORDER — ONDANSETRON HCL 8 MG PO TABS
8.0000 mg | ORAL_TABLET | Freq: Once | ORAL | Status: AC
  Administered 2014-07-06: 8 mg via ORAL

## 2014-07-06 MED ORDER — ONDANSETRON HCL 8 MG PO TABS
ORAL_TABLET | ORAL | Status: AC
  Filled 2014-07-06: qty 1

## 2014-07-06 NOTE — Patient Instructions (Signed)
Bortezomib injection What is this medicine? BORTEZOMIB (bor TEZ oh mib) is a chemotherapy drug. It slows the growth of cancer cells. This medicine is used to treat multiple myeloma, and certain lymphomas, such as mantle-cell lymphoma. This medicine may be used for other purposes; ask your health care provider or pharmacist if you have questions. COMMON BRAND NAME(S): Velcade What should I tell my health care provider before I take this medicine? They need to know if you have any of these conditions: -diabetes -heart disease -irregular heartbeat -liver disease -on hemodialysis -low blood counts, like low white blood cells, platelets, or hemoglobin -peripheral neuropathy -taking medicine for blood pressure -an unusual or allergic reaction to bortezomib, mannitol, boron, other medicines, foods, dyes, or preservatives -pregnant or trying to get pregnant -breast-feeding How should I use this medicine? This medicine is for injection into a vein or for injection under the skin. It is given by a health care professional in a hospital or clinic setting. Talk to your pediatrician regarding the use of this medicine in children. Special care may be needed. Overdosage: If you think you have taken too much of this medicine contact a poison control center or emergency room at once. NOTE: This medicine is only for you. Do not share this medicine with others. What if I miss a dose? It is important not to miss your dose. Call your doctor or health care professional if you are unable to keep an appointment. What may interact with this medicine? This medicine may interact with the following medications: -ketoconazole -rifampin -ritonavir -St. John's Wort This list may not describe all possible interactions. Give your health care provider a list of all the medicines, herbs, non-prescription drugs, or dietary supplements you use. Also tell them if you smoke, drink alcohol, or use illegal drugs. Some items  may interact with your medicine. What should I watch for while using this medicine? Visit your doctor for checks on your progress. This drug may make you feel generally unwell. This is not uncommon, as chemotherapy can affect healthy cells as well as cancer cells. Report any side effects. Continue your course of treatment even though you feel ill unless your doctor tells you to stop. You may get drowsy or dizzy. Do not drive, use machinery, or do anything that needs mental alertness until you know how this medicine affects you. Do not stand or sit up quickly, especially if you are an older patient. This reduces the risk of dizzy or fainting spells. In some cases, you may be given additional medicines to help with side effects. Follow all directions for their use. Call your doctor or health care professional for advice if you get a fever, chills or sore throat, or other symptoms of a cold or flu. Do not treat yourself. This drug decreases your body's ability to fight infections. Try to avoid being around people who are sick. This medicine may increase your risk to bruise or bleed. Call your doctor or health care professional if you notice any unusual bleeding. You may need blood work done while you are taking this medicine. In some patients, this medicine may cause a serious brain infection that may cause death. If you have any problems seeing, thinking, speaking, walking, or standing, tell your doctor right away. If you cannot reach your doctor, urgently seek other source of medical care. Do not become pregnant while taking this medicine. Women should inform their doctor if they wish to become pregnant or think they might be pregnant. There is   a potential for serious side effects to an unborn child. Talk to your health care professional or pharmacist for more information. Do not breast-feed an infant while taking this medicine. Check with your doctor or health care professional if you get an attack of  severe diarrhea, nausea and vomiting, or if you sweat a lot. The loss of too much body fluid can make it dangerous for you to take this medicine. What side effects may I notice from receiving this medicine? Side effects that you should report to your doctor or health care professional as soon as possible: -allergic reactions like skin rash, itching or hives, swelling of the face, lips, or tongue -breathing problems -changes in hearing -changes in vision -fast, irregular heartbeat -feeling faint or lightheaded, falls -pain, tingling, numbness in the hands or feet -right upper belly pain -seizures -swelling of the ankles, feet, hands -unusual bleeding or bruising -unusually weak or tired -vomiting -yellowing of the eyes or skin Side effects that usually do not require medical attention (report to your doctor or health care professional if they continue or are bothersome): -changes in emotions or moods -constipation -diarrhea -loss of appetite -headache -irritation at site where injected -nausea This list may not describe all possible side effects. Call your doctor for medical advice about side effects. You may report side effects to FDA at 1-800-FDA-1088. Where should I keep my medicine? This drug is given in a hospital or clinic and will not be stored at home. NOTE: This sheet is a summary. It may not cover all possible information. If you have questions about this medicine, talk to your doctor, pharmacist, or health care provider.  2015, [employer]. (2013-09-04 12:46:32)  

## 2014-07-09 ENCOUNTER — Telehealth: Payer: Self-pay

## 2014-07-09 NOTE — Telephone Encounter (Signed)
New Patient referral information including office notes, pathology, demographics and insurance card faxed to [place] with Dr Laverta Baltimore at [facility].  Cherice fax # [phone]. Her direct phone # [phone].  dph

## 2014-07-09 NOTE — Progress Notes (Signed)
Requested pathology slides be sent to [facility] [employer] overnight using billing # [id number] [place] Patient Coordinator Adult Stem Cell Transplant [address] [place] [address]

## 2014-07-11 ENCOUNTER — Other Ambulatory Visit: Payer: BC Managed Care – PPO | Admitting: Lab

## 2014-07-11 ENCOUNTER — Ambulatory Visit: Payer: BC Managed Care – PPO

## 2014-07-13 ENCOUNTER — Ambulatory Visit (HOSPITAL_BASED_OUTPATIENT_CLINIC_OR_DEPARTMENT_OTHER): Payer: BC Managed Care – PPO

## 2014-07-13 ENCOUNTER — Other Ambulatory Visit: Payer: BC Managed Care – PPO | Admitting: Lab

## 2014-07-13 ENCOUNTER — Ambulatory Visit: Payer: BC Managed Care – PPO

## 2014-07-13 ENCOUNTER — Other Ambulatory Visit (HOSPITAL_BASED_OUTPATIENT_CLINIC_OR_DEPARTMENT_OTHER): Payer: BC Managed Care – PPO | Admitting: Lab

## 2014-07-13 ENCOUNTER — Other Ambulatory Visit: Payer: Self-pay | Admitting: *Deleted

## 2014-07-13 VITALS — BP 142/90 | HR 62 | Temp 97.3°F | Resp 16

## 2014-07-13 DIAGNOSIS — Z5112 Encounter for antineoplastic immunotherapy: Secondary | ICD-10-CM

## 2014-07-13 DIAGNOSIS — C9000 Multiple myeloma not having achieved remission: Secondary | ICD-10-CM

## 2014-07-13 LAB — CBC WITH DIFFERENTIAL (CANCER CENTER ONLY)
BASO#: 0 10*3/uL (ref 0.0–0.2)
BASO%: 0.5 % (ref 0.0–2.0)
EOS%: 2.6 % (ref 0.0–7.0)
Eosinophils Absolute: 0.1 10*3/uL (ref 0.0–0.5)
HEMATOCRIT: 38.1 % — AB (ref 38.7–49.9)
HGB: 13.9 g/dL (ref 13.0–17.1)
LYMPH#: 1.8 10*3/uL (ref 0.9–3.3)
LYMPH%: 41.3 % (ref 14.0–48.0)
MCH: 31.8 pg (ref 28.0–33.4)
MCHC: 36.5 g/dL — AB (ref 32.0–35.9)
MCV: 87 fL (ref 82–98)
MONO#: 0.4 10*3/uL (ref 0.1–0.9)
MONO%: 9.1 % (ref 0.0–13.0)
NEUT#: 2 10*3/uL (ref 1.5–6.5)
NEUT%: 46.5 % (ref 40.0–80.0)
Platelets: 195 10*3/uL (ref 145–400)
RBC: 4.37 10*6/uL (ref 4.20–5.70)
RDW: 13.2 % (ref 11.1–15.7)
WBC: 4.3 10*3/uL (ref 4.0–10.0)

## 2014-07-13 LAB — BASIC METABOLIC PANEL - CANCER CENTER ONLY
BUN, Bld: 12 mg/dL (ref 7–22)
CALCIUM: 9.2 mg/dL (ref 8.0–10.3)
CHLORIDE: 106 meq/L (ref 98–108)
CO2: 25 mEq/L (ref 18–33)
CREATININE: 1.1 mg/dL (ref 0.6–1.2)
Glucose, Bld: 117 mg/dL (ref 73–118)
Potassium: 3.2 mEq/L — ABNORMAL LOW (ref 3.3–4.7)
Sodium: 141 mEq/L (ref 128–145)

## 2014-07-13 MED ORDER — ONDANSETRON HCL 8 MG PO TABS
ORAL_TABLET | ORAL | Status: AC
  Filled 2014-07-13: qty 1

## 2014-07-13 MED ORDER — BORTEZOMIB CHEMO SQ INJECTION 3.5 MG (2.5MG/ML)
1.3000 mg/m2 | Freq: Once | INTRAMUSCULAR | Status: AC
  Administered 2014-07-13: 2.75 mg via SUBCUTANEOUS
  Filled 2014-07-13: qty 2.75

## 2014-07-13 MED ORDER — ONDANSETRON HCL 8 MG PO TABS
8.0000 mg | ORAL_TABLET | Freq: Once | ORAL | Status: AC
  Administered 2014-07-13: 8 mg via ORAL

## 2014-07-13 NOTE — Patient Instructions (Signed)
Bortezomib injection What is this medicine? BORTEZOMIB (bor TEZ oh mib) is a chemotherapy drug. It slows the growth of cancer cells. This medicine is used to treat multiple myeloma, and certain lymphomas, such as mantle-cell lymphoma. This medicine may be used for other purposes; ask your health care provider or pharmacist if you have questions. COMMON BRAND NAME(S): Velcade What should I tell my health care provider before I take this medicine? They need to know if you have any of these conditions: -diabetes -heart disease -irregular heartbeat -liver disease -on hemodialysis -low blood counts, like low white blood cells, platelets, or hemoglobin -peripheral neuropathy -taking medicine for blood pressure -an unusual or allergic reaction to bortezomib, mannitol, boron, other medicines, foods, dyes, or preservatives -pregnant or trying to get pregnant -breast-feeding How should I use this medicine? This medicine is for injection into a vein or for injection under the skin. It is given by a health care professional in a hospital or clinic setting. Talk to your pediatrician regarding the use of this medicine in children. Special care may be needed. Overdosage: If you think you have taken too much of this medicine contact a poison control center or emergency room at once. NOTE: This medicine is only for you. Do not share this medicine with others. What if I miss a dose? It is important not to miss your dose. Call your doctor or health care professional if you are unable to keep an appointment. What may interact with this medicine? This medicine may interact with the following medications: -ketoconazole -rifampin -ritonavir -St. John's Wort This list may not describe all possible interactions. Give your health care provider a list of all the medicines, herbs, non-prescription drugs, or dietary supplements you use. Also tell them if you smoke, drink alcohol, or use illegal drugs. Some items  may interact with your medicine. What should I watch for while using this medicine? Visit your doctor for checks on your progress. This drug may make you feel generally unwell. This is not uncommon, as chemotherapy can affect healthy cells as well as cancer cells. Report any side effects. Continue your course of treatment even though you feel ill unless your doctor tells you to stop. You may get drowsy or dizzy. Do not drive, use machinery, or do anything that needs mental alertness until you know how this medicine affects you. Do not stand or sit up quickly, especially if you are an older patient. This reduces the risk of dizzy or fainting spells. In some cases, you may be given additional medicines to help with side effects. Follow all directions for their use. Call your doctor or health care professional for advice if you get a fever, chills or sore throat, or other symptoms of a cold or flu. Do not treat yourself. This drug decreases your body's ability to fight infections. Try to avoid being around people who are sick. This medicine may increase your risk to bruise or bleed. Call your doctor or health care professional if you notice any unusual bleeding. You may need blood work done while you are taking this medicine. In some patients, this medicine may cause a serious brain infection that may cause death. If you have any problems seeing, thinking, speaking, walking, or standing, tell your doctor right away. If you cannot reach your doctor, urgently seek other source of medical care. Do not become pregnant while taking this medicine. Women should inform their doctor if they wish to become pregnant or think they might be pregnant. There is   a potential for serious side effects to an unborn child. Talk to your health care professional or pharmacist for more information. Do not breast-feed an infant while taking this medicine. Check with your doctor or health care professional if you get an attack of  severe diarrhea, nausea and vomiting, or if you sweat a lot. The loss of too much body fluid can make it dangerous for you to take this medicine. What side effects may I notice from receiving this medicine? Side effects that you should report to your doctor or health care professional as soon as possible: -allergic reactions like skin rash, itching or hives, swelling of the face, lips, or tongue -breathing problems -changes in hearing -changes in vision -fast, irregular heartbeat -feeling faint or lightheaded, falls -pain, tingling, numbness in the hands or feet -right upper belly pain -seizures -swelling of the ankles, feet, hands -unusual bleeding or bruising -unusually weak or tired -vomiting -yellowing of the eyes or skin Side effects that usually do not require medical attention (report to your doctor or health care professional if they continue or are bothersome): -changes in emotions or moods -constipation -diarrhea -loss of appetite -headache -irritation at site where injected -nausea This list may not describe all possible side effects. Call your doctor for medical advice about side effects. You may report side effects to FDA at 1-800-FDA-1088. Where should I keep my medicine? This drug is given in a hospital or clinic and will not be stored at home. NOTE: This sheet is a summary. It may not cover all possible information. If you have questions about this medicine, talk to your doctor, pharmacist, or health care provider.  2015, [employer]. (2013-09-04 12:46:32)  

## 2014-07-18 ENCOUNTER — Ambulatory Visit: Payer: BC Managed Care – PPO

## 2014-07-18 ENCOUNTER — Other Ambulatory Visit: Payer: BC Managed Care – PPO | Admitting: Lab

## 2014-07-20 ENCOUNTER — Ambulatory Visit (HOSPITAL_BASED_OUTPATIENT_CLINIC_OR_DEPARTMENT_OTHER): Payer: BC Managed Care – PPO

## 2014-07-20 ENCOUNTER — Other Ambulatory Visit: Payer: BC Managed Care – PPO | Admitting: Lab

## 2014-07-20 ENCOUNTER — Ambulatory Visit: Payer: BC Managed Care – PPO

## 2014-07-20 ENCOUNTER — Other Ambulatory Visit (HOSPITAL_BASED_OUTPATIENT_CLINIC_OR_DEPARTMENT_OTHER): Payer: BC Managed Care – PPO | Admitting: Lab

## 2014-07-20 VITALS — BP 138/90 | HR 70 | Temp 97.1°F | Resp 16

## 2014-07-20 DIAGNOSIS — C9000 Multiple myeloma not having achieved remission: Secondary | ICD-10-CM

## 2014-07-20 DIAGNOSIS — Z5112 Encounter for antineoplastic immunotherapy: Secondary | ICD-10-CM

## 2014-07-20 LAB — CBC WITH DIFFERENTIAL (CANCER CENTER ONLY)
BASO#: 0 10*3/uL (ref 0.0–0.2)
BASO%: 1 % (ref 0.0–2.0)
EOS ABS: 0.2 10*3/uL (ref 0.0–0.5)
EOS%: 4 % (ref 0.0–7.0)
HCT: 38.3 % — ABNORMAL LOW (ref 38.7–49.9)
HEMOGLOBIN: 13.7 g/dL (ref 13.0–17.1)
LYMPH#: 1.4 10*3/uL (ref 0.9–3.3)
LYMPH%: 35.1 % (ref 14.0–48.0)
MCH: 31.8 pg (ref 28.0–33.4)
MCHC: 35.8 g/dL (ref 32.0–35.9)
MCV: 89 fL (ref 82–98)
MONO#: 0.5 10*3/uL (ref 0.1–0.9)
MONO%: 12.8 % (ref 0.0–13.0)
NEUT#: 1.9 10*3/uL (ref 1.5–6.5)
NEUT%: 47.1 % (ref 40.0–80.0)
Platelets: 210 10*3/uL (ref 145–400)
RBC: 4.31 10*6/uL (ref 4.20–5.70)
RDW: 13.7 % (ref 11.1–15.7)
WBC: 4.1 10*3/uL (ref 4.0–10.0)

## 2014-07-20 LAB — BASIC METABOLIC PANEL - CANCER CENTER ONLY
BUN: 9 mg/dL (ref 7–22)
CALCIUM: 7.9 mg/dL — AB (ref 8.0–10.3)
CO2: 24 meq/L (ref 18–33)
Chloride: 103 mEq/L (ref 98–108)
Creat: 1 mg/dl (ref 0.6–1.2)
GLUCOSE: 148 mg/dL — AB (ref 73–118)
Potassium: 3.8 mEq/L (ref 3.3–4.7)
Sodium: 137 mEq/L (ref 128–145)

## 2014-07-20 MED ORDER — SODIUM CHLORIDE 0.9 % IJ SOLN
10.0000 mL | INTRAMUSCULAR | Status: DC | PRN
  Filled 2014-07-20: qty 10

## 2014-07-20 MED ORDER — ALTEPLASE 2 MG IJ SOLR
2.0000 mg | Freq: Once | INTRAMUSCULAR | Status: DC | PRN
  Filled 2014-07-20: qty 2

## 2014-07-20 MED ORDER — SODIUM CHLORIDE 0.9 % IJ SOLN
3.0000 mL | Freq: Once | INTRAMUSCULAR | Status: DC | PRN
  Filled 2014-07-20: qty 10

## 2014-07-20 MED ORDER — ONDANSETRON HCL 8 MG PO TABS
ORAL_TABLET | ORAL | Status: AC
  Filled 2014-07-20: qty 1

## 2014-07-20 MED ORDER — ZOLEDRONIC ACID 4 MG/100ML IV SOLN
4.0000 mg | Freq: Once | INTRAVENOUS | Status: AC
  Administered 2014-07-20: 4 mg via INTRAVENOUS
  Filled 2014-07-20: qty 100

## 2014-07-20 MED ORDER — ONDANSETRON HCL 8 MG PO TABS
8.0000 mg | ORAL_TABLET | Freq: Once | ORAL | Status: AC
  Administered 2014-07-20: 8 mg via ORAL

## 2014-07-20 MED ORDER — BORTEZOMIB CHEMO SQ INJECTION 3.5 MG (2.5MG/ML)
1.3000 mg/m2 | Freq: Once | INTRAMUSCULAR | Status: AC
  Administered 2014-07-20: 2.75 mg via SUBCUTANEOUS
  Filled 2014-07-20: qty 2.75

## 2014-07-20 MED ORDER — HEPARIN SOD (PORK) LOCK FLUSH 100 UNIT/ML IV SOLN
500.0000 [IU] | Freq: Once | INTRAVENOUS | Status: DC | PRN
  Filled 2014-07-20: qty 5

## 2014-07-20 MED ORDER — SODIUM CHLORIDE 0.9 % IV SOLN
Freq: Once | INTRAVENOUS | Status: AC
  Administered 2014-07-20: 12:00:00 via INTRAVENOUS

## 2014-07-20 NOTE — Patient Instructions (Signed)
Zoledronic Acid injection (Hypercalcemia, Oncology) What is this medicine? ZOLEDRONIC ACID (ZOE le dron ik AS id) lowers the amount of calcium loss from bone. It is used to treat too much calcium in your blood from cancer. It is also used to prevent complications of cancer that has spread to the bone. This medicine may be used for other purposes; ask your health care provider or pharmacist if you have questions. COMMON BRAND NAME(S): Zometa What should I tell my health care provider before I take this medicine? They need to know if you have any of these conditions: -aspirin-sensitive asthma -cancer, especially if you are receiving medicines used to treat cancer -dental disease or wear dentures -infection -kidney disease -receiving corticosteroids like dexamethasone or prednisone -an unusual or allergic reaction to zoledronic acid, other medicines, foods, dyes, or preservatives -pregnant or trying to get pregnant -breast-feeding How should I use this medicine? This medicine is for infusion into a vein. It is given by a health care professional in a hospital or clinic setting. Talk to your pediatrician regarding the use of this medicine in children. Special care may be needed. Overdosage: If you think you have taken too much of this medicine contact a poison control center or emergency room at once. NOTE: This medicine is only for you. Do not share this medicine with others. What if I miss a dose? It is important not to miss your dose. Call your doctor or health care professional if you are unable to keep an appointment. What may interact with this medicine? -certain antibiotics given by injection -NSAIDs, medicines for pain and inflammation, like ibuprofen or naproxen -some diuretics like bumetanide, furosemide -teriparatide -thalidomide This list may not describe all possible interactions. Give your health care provider a list of all the medicines, herbs, non-prescription drugs, or  dietary supplements you use. Also tell them if you smoke, drink alcohol, or use illegal drugs. Some items may interact with your medicine. What should I watch for while using this medicine? Visit your doctor or health care professional for regular checkups. It may be some time before you see the benefit from this medicine. Do not stop taking your medicine unless your doctor tells you to. Your doctor may order blood tests or other tests to see how you are doing. Women should inform their doctor if they wish to become pregnant or think they might be pregnant. There is a potential for serious side effects to an unborn child. Talk to your health care professional or pharmacist for more information. You should make sure that you get enough calcium and vitamin D while you are taking this medicine. Discuss the foods you eat and the vitamins you take with your health care professional. Some people who take this medicine have severe bone, joint, and/or muscle pain. This medicine may also increase your risk for jaw problems or a broken thigh bone. Tell your doctor right away if you have severe pain in your jaw, bones, joints, or muscles. Tell your doctor if you have any pain that does not go away or that gets worse. Tell your dentist and dental surgeon that you are taking this medicine. You should not have major dental surgery while on this medicine. See your dentist to have a dental exam and fix any dental problems before starting this medicine. Take good care of your teeth while on this medicine. Make sure you see your dentist for regular follow-up appointments. What side effects may I notice from receiving this medicine? Side effects that   you should report to your doctor or health care professional as soon as possible: -allergic reactions like skin rash, itching or hives, swelling of the face, lips, or tongue -anxiety, confusion, or depression -breathing problems -changes in vision -eye pain -feeling faint or  lightheaded, falls -jaw pain, especially after dental work -mouth sores -muscle cramps, stiffness, or weakness -trouble passing urine or change in the amount of urine Side effects that usually do not require medical attention (report to your doctor or health care professional if they continue or are bothersome): -bone, joint, or muscle pain -constipation -diarrhea -fever -hair loss -irritation at site where injected -loss of appetite -nausea, vomiting -stomach upset -trouble sleeping -trouble swallowing -weak or tired This list may not describe all possible side effects. Call your doctor for medical advice about side effects. You may report side effects to FDA at 1-800-FDA-1088. Where should I keep my medicine? This drug is given in a hospital or clinic and will not be stored at home. NOTE: This sheet is a summary. It may not cover all possible information. If you have questions about this medicine, talk to your doctor, pharmacist, or health care provider.  2015, Elsevier/Gold Standard. (2013-04-20 13:03:13)  Bortezomib injection What is this medicine? BORTEZOMIB (bor TEZ oh mib) is a chemotherapy drug. It slows the growth of cancer cells. This medicine is used to treat multiple myeloma, and certain lymphomas, such as mantle-cell lymphoma. This medicine may be used for other purposes; ask your health care provider or pharmacist if you have questions. COMMON BRAND NAME(S): Velcade What should I tell my health care provider before I take this medicine? They need to know if you have any of these conditions: -diabetes -heart disease -irregular heartbeat -liver disease -on hemodialysis -low blood counts, like low white blood cells, platelets, or hemoglobin -peripheral neuropathy -taking medicine for blood pressure -an unusual or allergic reaction to bortezomib, mannitol, boron, other medicines, foods, dyes, or preservatives -pregnant or trying to get pregnant -breast-feeding How  should I use this medicine? This medicine is for injection into a vein or for injection under the skin. It is given by a health care professional in a hospital or clinic setting. Talk to your pediatrician regarding the use of this medicine in children. Special care may be needed. Overdosage: If you think you have taken too much of this medicine contact a poison control center or emergency room at once. NOTE: This medicine is only for you. Do not share this medicine with others. What if I miss a dose? It is important not to miss your dose. Call your doctor or health care professional if you are unable to keep an appointment. What may interact with this medicine? This medicine may interact with the following medications: -ketoconazole -rifampin -ritonavir -St. John's Wort This list may not describe all possible interactions. Give your health care provider a list of all the medicines, herbs, non-prescription drugs, or dietary supplements you use. Also tell them if you smoke, drink alcohol, or use illegal drugs. Some items may interact with your medicine. What should I watch for while using this medicine? Visit your doctor for checks on your progress. This drug may make you feel generally unwell. This is not uncommon, as chemotherapy can affect healthy cells as well as cancer cells. Report any side effects. Continue your course of treatment even though you feel ill unless your doctor tells you to stop. You may get drowsy or dizzy. Do not drive, use machinery, or do anything that needs mental   until you know how this medicine affects you. Do not stand or sit up quickly, especially if you are an older patient. This reduces the risk of dizzy or fainting spells. In some cases, you may be given additional medicines to help with side effects. Follow all directions for their use. Call your doctor or health care professional for advice if you get a fever, chills or sore throat, or other symptoms of a  cold or flu. Do not treat yourself. This drug decreases your body's ability to fight infections. Try to avoid being around people who are sick. This medicine may increase your risk to bruise or bleed. Call your doctor or health care professional if you notice any unusual bleeding. You may need blood work done while you are taking this medicine. In some patients, this medicine may cause a serious brain infection that may cause death. If you have any problems seeing, thinking, speaking, walking, or standing, tell your doctor right away. If you cannot reach your doctor, urgently seek other source of medical care. Do not become pregnant while taking this medicine. Women should inform their doctor if they wish to become pregnant or think they might be pregnant. There is a potential for serious side effects to an unborn child. Talk to your health care professional or pharmacist for more information. Do not breast-feed an infant while taking this medicine. Check with your doctor or health care professional if you get an attack of severe diarrhea, nausea and vomiting, or if you sweat a lot. The loss of too much body fluid can make it dangerous for you to take this medicine. What side effects may I notice from receiving this medicine? Side effects that you should report to your doctor or health care professional as soon as possible: -allergic reactions like skin rash, itching or hives, swelling of the face, lips, or tongue -breathing problems -changes in hearing -changes in vision -fast, irregular heartbeat -feeling faint or lightheaded, falls -pain, tingling, numbness in the hands or feet -right upper belly pain -seizures -swelling of the ankles, feet, hands -unusual bleeding or bruising -unusually weak or tired -vomiting -yellowing of the eyes or skin Side effects that usually do not require medical attention (report to your doctor or health care professional if they continue or are  bothersome): -changes in emotions or moods -constipation -diarrhea -loss of appetite -headache -irritation at site where injected -nausea This list may not describe all possible side effects. Call your doctor for medical advice about side effects. You may report side effects to FDA at 1-800-FDA-1088. Where should I keep my medicine? This drug is given in a hospital or clinic and will not be stored at home. NOTE: This sheet is a summary. It may not cover all possible information. If you have questions about this medicine, talk to your doctor, pharmacist, or health care provider.  2015, Elsevier/Gold Standard. (2013-09-04 12:46:32)

## 2014-08-03 ENCOUNTER — Ambulatory Visit: Payer: BC Managed Care – PPO

## 2014-08-03 ENCOUNTER — Ambulatory Visit: Payer: BC Managed Care – PPO | Admitting: Family

## 2014-08-03 ENCOUNTER — Other Ambulatory Visit: Payer: BC Managed Care – PPO | Admitting: Lab

## 2014-08-07 ENCOUNTER — Encounter: Payer: Self-pay | Admitting: *Deleted

## 2014-08-10 ENCOUNTER — Other Ambulatory Visit: Payer: Self-pay | Admitting: *Deleted

## 2014-08-10 ENCOUNTER — Encounter: Payer: Self-pay | Admitting: Family

## 2014-08-10 ENCOUNTER — Ambulatory Visit (HOSPITAL_BASED_OUTPATIENT_CLINIC_OR_DEPARTMENT_OTHER): Payer: BC Managed Care – PPO | Admitting: Lab

## 2014-08-10 ENCOUNTER — Ambulatory Visit (HOSPITAL_BASED_OUTPATIENT_CLINIC_OR_DEPARTMENT_OTHER): Payer: BC Managed Care – PPO

## 2014-08-10 ENCOUNTER — Ambulatory Visit (HOSPITAL_BASED_OUTPATIENT_CLINIC_OR_DEPARTMENT_OTHER): Payer: BC Managed Care – PPO | Admitting: Family

## 2014-08-10 VITALS — BP 134/92 | HR 60 | Temp 98.1°F | Resp 18 | Ht 71.0 in | Wt 198.0 lb

## 2014-08-10 DIAGNOSIS — E291 Testicular hypofunction: Secondary | ICD-10-CM

## 2014-08-10 DIAGNOSIS — K5901 Slow transit constipation: Secondary | ICD-10-CM

## 2014-08-10 DIAGNOSIS — C9000 Multiple myeloma not having achieved remission: Secondary | ICD-10-CM

## 2014-08-10 DIAGNOSIS — N521 Erectile dysfunction due to diseases classified elsewhere: Secondary | ICD-10-CM

## 2014-08-10 DIAGNOSIS — Z5112 Encounter for antineoplastic immunotherapy: Secondary | ICD-10-CM

## 2014-08-10 LAB — CMP (CANCER CENTER ONLY)
ALT(SGPT): 18 U/L (ref 10–47)
AST: 22 U/L (ref 11–38)
Albumin: 3.9 g/dL (ref 3.3–5.5)
Alkaline Phosphatase: 28 U/L (ref 26–84)
BUN: 11 mg/dL (ref 7–22)
CO2: 26 mEq/L (ref 18–33)
CREATININE: 1.4 mg/dL — AB (ref 0.6–1.2)
Calcium: 9.2 mg/dL (ref 8.0–10.3)
Chloride: 104 mEq/L (ref 98–108)
GLUCOSE: 78 mg/dL (ref 73–118)
Potassium: 3.6 mEq/L (ref 3.3–4.7)
Sodium: 142 mEq/L (ref 128–145)
Total Bilirubin: 1.1 mg/dl (ref 0.20–1.60)
Total Protein: 7.1 g/dL (ref 6.4–8.1)

## 2014-08-10 LAB — CBC WITH DIFFERENTIAL (CANCER CENTER ONLY)
BASO#: 0 10*3/uL (ref 0.0–0.2)
BASO%: 0.7 % (ref 0.0–2.0)
EOS ABS: 0.1 10*3/uL (ref 0.0–0.5)
EOS%: 2.6 % (ref 0.0–7.0)
HCT: 40 % (ref 38.7–49.9)
HEMOGLOBIN: 14.4 g/dL (ref 13.0–17.1)
LYMPH#: 1.7 10*3/uL (ref 0.9–3.3)
LYMPH%: 39.9 % (ref 14.0–48.0)
MCH: 32.4 pg (ref 28.0–33.4)
MCHC: 36 g/dL — ABNORMAL HIGH (ref 32.0–35.9)
MCV: 90 fL (ref 82–98)
MONO#: 0.3 10*3/uL (ref 0.1–0.9)
MONO%: 7.5 % (ref 0.0–13.0)
NEUT#: 2.1 10*3/uL (ref 1.5–6.5)
NEUT%: 49.3 % (ref 40.0–80.0)
Platelets: 229 10*3/uL (ref 145–400)
RBC: 4.45 10*6/uL (ref 4.20–5.70)
RDW: 13.7 % (ref 11.1–15.7)
WBC: 4.3 10*3/uL (ref 4.0–10.0)

## 2014-08-10 LAB — LACTATE DEHYDROGENASE: LDH: 135 U/L (ref 94–250)

## 2014-08-10 MED ORDER — ONDANSETRON HCL 8 MG PO TABS
ORAL_TABLET | ORAL | Status: AC
  Filled 2014-08-10: qty 1

## 2014-08-10 MED ORDER — LENALIDOMIDE 25 MG PO CAPS: 25.0000 mg | ORAL_CAPSULE | Freq: Every day | ORAL | Status: DC

## 2014-08-10 MED ORDER — ONDANSETRON HCL 8 MG PO TABS
8.0000 mg | ORAL_TABLET | Freq: Once | ORAL | Status: AC
  Administered 2014-08-10: 8 mg via ORAL

## 2014-08-10 MED ORDER — BORTEZOMIB CHEMO SQ INJECTION 3.5 MG (2.5MG/ML)
1.3000 mg/m2 | Freq: Once | INTRAMUSCULAR | Status: AC
  Administered 2014-08-10: 2.75 mg via SUBCUTANEOUS
  Filled 2014-08-10: qty 2.75

## 2014-08-10 NOTE — Patient Instructions (Signed)
Bortezomib injection What is this medicine? BORTEZOMIB (bor TEZ oh mib) is a chemotherapy drug. It slows the growth of cancer cells. This medicine is used to treat multiple myeloma, and certain lymphomas, such as mantle-cell lymphoma. This medicine may be used for other purposes; ask your health care provider or pharmacist if you have questions. COMMON BRAND NAME(S): Velcade What should I tell my health care provider before I take this medicine? They need to know if you have any of these conditions: -diabetes -heart disease -irregular heartbeat -liver disease -on hemodialysis -low blood counts, like low white blood cells, platelets, or hemoglobin -peripheral neuropathy -taking medicine for blood pressure -an unusual or allergic reaction to bortezomib, mannitol, boron, other medicines, foods, dyes, or preservatives -pregnant or trying to get pregnant -breast-feeding How should I use this medicine? This medicine is for injection into a vein or for injection under the skin. It is given by a health care professional in a hospital or clinic setting. Talk to your pediatrician regarding the use of this medicine in children. Special care may be needed. Overdosage: If you think you have taken too much of this medicine contact a poison control center or emergency room at once. NOTE: This medicine is only for you. Do not share this medicine with others. What if I miss a dose? It is important not to miss your dose. Call your doctor or health care professional if you are unable to keep an appointment. What may interact with this medicine? This medicine may interact with the following medications: -ketoconazole -rifampin -ritonavir -St. John's Wort This list may not describe all possible interactions. Give your health care provider a list of all the medicines, herbs, non-prescription drugs, or dietary supplements you use. Also tell them if you smoke, drink alcohol, or use illegal drugs. Some items  may interact with your medicine. What should I watch for while using this medicine? Visit your doctor for checks on your progress. This drug may make you feel generally unwell. This is not uncommon, as chemotherapy can affect healthy cells as well as cancer cells. Report any side effects. Continue your course of treatment even though you feel ill unless your doctor tells you to stop. You may get drowsy or dizzy. Do not drive, use machinery, or do anything that needs mental alertness until you know how this medicine affects you. Do not stand or sit up quickly, especially if you are an older patient. This reduces the risk of dizzy or fainting spells. In some cases, you may be given additional medicines to help with side effects. Follow all directions for their use. Call your doctor or health care professional for advice if you get a fever, chills or sore throat, or other symptoms of a cold or flu. Do not treat yourself. This drug decreases your body's ability to fight infections. Try to avoid being around people who are sick. This medicine may increase your risk to bruise or bleed. Call your doctor or health care professional if you notice any unusual bleeding. You may need blood work done while you are taking this medicine. In some patients, this medicine may cause a serious brain infection that may cause death. If you have any problems seeing, thinking, speaking, walking, or standing, tell your doctor right away. If you cannot reach your doctor, urgently seek other source of medical care. Do not become pregnant while taking this medicine. Women should inform their doctor if they wish to become pregnant or think they might be pregnant. There is   a potential for serious side effects to an unborn child. Talk to your health care professional or pharmacist for more information. Do not breast-feed an infant while taking this medicine. Check with your doctor or health care professional if you get an attack of  severe diarrhea, nausea and vomiting, or if you sweat a lot. The loss of too much body fluid can make it dangerous for you to take this medicine. What side effects may I notice from receiving this medicine? Side effects that you should report to your doctor or health care professional as soon as possible: -allergic reactions like skin rash, itching or hives, swelling of the face, lips, or tongue -breathing problems -changes in hearing -changes in vision -fast, irregular heartbeat -feeling faint or lightheaded, falls -pain, tingling, numbness in the hands or feet -right upper belly pain -seizures -swelling of the ankles, feet, hands -unusual bleeding or bruising -unusually weak or tired -vomiting -yellowing of the eyes or skin Side effects that usually do not require medical attention (report to your doctor or health care professional if they continue or are bothersome): -changes in emotions or moods -constipation -diarrhea -loss of appetite -headache -irritation at site where injected -nausea This list may not describe all possible side effects. Call your doctor for medical advice about side effects. You may report side effects to FDA at 1-800-FDA-1088. Where should I keep my medicine? This drug is given in a hospital or clinic and will not be stored at home. NOTE: This sheet is a summary. It may not cover all possible information. If you have questions about this medicine, talk to your doctor, pharmacist, or health care provider.  2015, [employer]. (2013-09-04 12:46:32)  

## 2014-08-10 NOTE — Progress Notes (Signed)
[place]  [phone] [phone]) [phone]  ID: Jimmy Day OB: 12-24-1960 MR#: [id number] MWU#:[id number] Patient Care Team: Hendricks Limes, MD as PCP - General  DIAGNOSIS: IgG Kappa myeloma  INTERVAL HISTORY: Jimmy Day is here today for a follow-up. In July, he had his bone marrow test done which showed a normocellular marrow with 5% plasma cells. We did his bone survey. He had multiple small lytic lesions. He is feeling good and still working in [employer]. He is going to [facility] for a stem cell transplant work up. He plans on calling them back today to make an appointment in January. Lactulose has help with his constipation. He denies fever, chills, n/v, cough, rash, headache, dizziness, SOB, chest pain, palpitations, abdominal pain, diarrhea, blood in urine or stool. Has has had no pain or bleeding. No swelling, tenderness, numbness or tingling in his extremities. His appetite is good and he drinks lots of water. In June, his serum light chain was 5.15 mg/dL.   CURRENT TREATMENT: Status post 4 cycles of Velcade/Revlimid/Decadron  Zometa 4 mg IV every month  REVIEW OF SYSTEMS: All other 10 point review of systems is negative.   PAST MEDICAL HISTORY: Past Medical History  Diagnosis Date  . Fasting hyperglycemia     NORMAL A1c  . Hyperlipidemia   . Testosterone deficiency     Dr Hartley Barefoot  . Hypertension   . Kappa light chain myeloma 02/02/2014   PAST SURGICAL HISTORY: Past Surgical History  Procedure Laterality Date  . Lipomas removal      BENIGN FROM CHEST AND BACK  . Cervical plate insertion      POST COMPRESSION DISC INJURY   FAMILY HISTORY Family History  Problem Relation Age of Onset  . Breast cancer Mother   . Heart attack Father 74  . Kidney disease Brother     RENAL FAILURE  . Hypertension Brother   . Heart attack Maternal Aunt 65    CABG  . Stroke Maternal Uncle     CVA  . Diabetes Neg Hx   . Colon cancer Neg Hx   . Esophageal  cancer Neg Hx   . Rectal cancer Neg Hx   . Stomach cancer Neg Hx    GYNECOLOGIC HISTORY:  No LMP for male patient.   SOCIAL HISTORY:  History   Social History  . Marital Status: Married    Spouse Name: N/A    Number of Children: 3  . Years of Education: N/A   Occupational History  . Truck Geophysicist/field seismologist    Social History Main Topics  . Smoking status: Never Smoker   . Smokeless tobacco: Never Used     Comment: never used tobacco  . Alcohol Use: Yes     Comment:  rarely  . Drug Use: No  . Sexual Activity: Yes    Birth Control/ Protection: Condom   Other Topics Concern  . Not on file   Social History Narrative  . No narrative on file   ADVANCED DIRECTIVES: <no information>  HEALTH MAINTENANCE: History  Substance Use Topics  . Smoking status: Never Smoker   . Smokeless tobacco: Never Used     Comment: never used tobacco  . Alcohol Use: Yes     Comment:  rarely   Colonoscopy: PAP: Bone density: Lipid panel:  No Known Allergies  Current Outpatient Prescriptions  Medication Sig Dispense Refill  . amLODipine (NORVASC) 5 MG tablet Take 5 mg by mouth every morning.       Marland Kitchen  aspirin 325 MG tablet Take 325 mg by mouth daily.      Marland Kitchen atorvastatin (LIPITOR) 20 MG tablet Take 1 tablet (20 mg total) by mouth daily.  90 tablet  3  . famciclovir (FAMVIR) 250 MG tablet Take 250 mg by mouth daily.      . Lactulose 20 GM/30ML SOLN Take 30 mLs (20 g total) by mouth every 8 (eight) hours.  960 mL  2  . lenalidomide (REVLIMID) 25 MG capsule Take 1 capsule (25 mg total) by mouth daily. Auth # [id number]  21 capsule  4  . psyllium (METAMUCIL) 58.6 % powder Take 1 packet by mouth daily.      . sildenafil (VIAGRA) 100 MG tablet Take 1 tablet (100 mg total) by mouth daily as needed for erectile dysfunction.  10 tablet  2   No current facility-administered medications for this visit.   OBJECTIVE: Filed Vitals:   08/10/14 1156  BP: 134/92  Pulse: 60  Temp: 98.1 F (36.7 C)  Resp: 18    Body mass index is 27.63 kg/(m^2). ECOG FS:0 - Asymptomatic Ocular: Sclerae unicteric, pupils equal, round and reactive to light Ear-nose-throat: Oropharynx clear, dentition fair Lymphatic: No cervical or supraclavicular adenopathy Lungs no rales or rhonchi, good excursion bilaterally Heart regular rate and rhythm, no murmur appreciated Abd soft, nontender, positive bowel sounds MSK no focal spinal tenderness, no joint edema Neuro: non-focal, well-oriented, appropriate affect  LAB RESULTS: CMP     Component Value Date/Time   NA 142 08/10/2014 1150   NA 137 02/02/2014 1557   K 3.6 08/10/2014 1150   K 3.8 02/02/2014 1557   CL 104 08/10/2014 1150   CL 103 02/02/2014 1557   CO2 26 08/10/2014 1150   CO2 27 02/02/2014 1557   GLUCOSE 78 08/10/2014 1150   GLUCOSE 92 02/02/2014 1557   BUN 11 08/10/2014 1150   BUN 12 02/02/2014 1557   CREATININE 1.4* 08/10/2014 1150   CREATININE 1.22 02/02/2014 1557   CALCIUM 9.2 08/10/2014 1150   CALCIUM 9.2 02/02/2014 1557   PROT 7.1 08/10/2014 1150   PROT 6.7 02/02/2014 1557   ALBUMIN 4.0 02/02/2014 1557   AST 22 08/10/2014 1150   AST 16 02/02/2014 1557   ALT 18 08/10/2014 1150   ALT 15 02/02/2014 1557   ALKPHOS 28 08/10/2014 1150   ALKPHOS 47 02/02/2014 1557   BILITOT 1.10 08/10/2014 1150   BILITOT 0.4 02/02/2014 1557   GFRNONAA 82* 01/29/2014 0240   GFRAA >90 01/29/2014 0240   No results found for this basename: SPEP, UPEP,  kappa and lambda light chains   Lab Results  Component Value Date   WBC 4.3 08/10/2014   NEUTROABS 2.1 08/10/2014   HGB 14.4 08/10/2014   HCT 40.0 08/10/2014   MCV 90 08/10/2014   PLT 229 08/10/2014   No results found for this basename: LABCA2   No components found with this basename: LABCA125   No results found for this basename: INR,  in the last 168 hours  STUDIES: No results found.  ASSESSMENT/PLAN: Jimmy Day is 53 year old gentleman with IgG kappa myeloma. When he was initially seen he did not show an obvious heavy chain although he did  have decreased IgA and IgM levels. He now has detectable heavy chain.  He seems to have responded. His light chains have decreased nicely. His bone marrow shows only 5% plasma cells. He is making an appointment with [facility] today for stem cell transplant work up in January.  We will  proceed with treatment today.  His CBC and CMP today look good. We will wait and see what the rest of his labs show.  We will see him back next month for labs, follow-up and treatment.  He knows to call here with any questions and concerns and to go to the ED in the event of an emergency. We can certainly see him sooner if need be.   Eliezer Bottom, NP 08/10/2014 12:41 PM

## 2014-08-14 LAB — PROTEIN ELECTROPHORESIS, SERUM, WITH REFLEX
ALPHA-1-GLOBULIN: 5.3 % — AB (ref 2.9–4.9)
ALPHA-2-GLOBULIN: 7.1 % (ref 7.1–11.8)
Albumin ELP: 62.1 % (ref 55.8–66.1)
BETA 2: 2.5 % — AB (ref 3.2–6.5)
Beta Globulin: 5.2 % (ref 4.7–7.2)
GAMMA GLOBULIN: 17.8 % (ref 11.1–18.8)
M-Spike, %: 0.28 g/dL
Total Protein, Serum Electrophoresis: 6.7 g/dL (ref 6.0–8.3)

## 2014-08-14 LAB — KAPPA/LAMBDA LIGHT CHAINS
KAPPA FREE LGHT CHN: 5.1 mg/dL — AB (ref 0.33–1.94)
Kappa:Lambda Ratio: 5.05 — ABNORMAL HIGH (ref 0.26–1.65)
Lambda Free Lght Chn: 1.01 mg/dL (ref 0.57–2.63)

## 2014-08-14 LAB — BETA 2 MICROGLOBULIN, SERUM: Beta-2 Microglobulin: 1.52 mg/L (ref <=2.51)

## 2014-08-14 LAB — IGG, IGA, IGM
IGG (IMMUNOGLOBIN G), SERUM: 1320 mg/dL (ref 650–1600)
IGM, SERUM: 29 mg/dL — AB (ref 41–251)
IgA: 20 mg/dL — ABNORMAL LOW (ref 68–379)

## 2014-08-14 LAB — IFE INTERPRETATION

## 2014-08-16 LAB — UIFE/LIGHT CHAINS/TP QN, 24-HR UR
ALPHA 1 UR: DETECTED — AB
Albumin, U: DETECTED
Alpha 2, Urine: DETECTED — AB
Beta, Urine: DETECTED — AB
Gamma Globulin, Urine: DETECTED — AB
TIME-UPE24: 24 h
TOTAL PROTEIN, URINE-UPE24: 11 mg/dL (ref 5–25)
Total Protein, Urine-Ur/day: 44 mg/d (ref <150)
Volume, Urine: 400 mL

## 2014-08-17 ENCOUNTER — Other Ambulatory Visit: Payer: Self-pay | Admitting: Nurse Practitioner

## 2014-08-17 ENCOUNTER — Ambulatory Visit (HOSPITAL_BASED_OUTPATIENT_CLINIC_OR_DEPARTMENT_OTHER): Payer: BC Managed Care – PPO

## 2014-08-17 ENCOUNTER — Encounter: Payer: Self-pay | Admitting: Family

## 2014-08-17 ENCOUNTER — Ambulatory Visit (HOSPITAL_BASED_OUTPATIENT_CLINIC_OR_DEPARTMENT_OTHER): Payer: BC Managed Care – PPO | Admitting: Lab

## 2014-08-17 ENCOUNTER — Encounter: Payer: BC Managed Care – PPO | Admitting: Family

## 2014-08-17 DIAGNOSIS — C9000 Multiple myeloma not having achieved remission: Secondary | ICD-10-CM

## 2014-08-17 DIAGNOSIS — N521 Erectile dysfunction due to diseases classified elsewhere: Secondary | ICD-10-CM

## 2014-08-17 DIAGNOSIS — K5901 Slow transit constipation: Secondary | ICD-10-CM

## 2014-08-17 DIAGNOSIS — Z5112 Encounter for antineoplastic immunotherapy: Secondary | ICD-10-CM

## 2014-08-17 DIAGNOSIS — E291 Testicular hypofunction: Secondary | ICD-10-CM

## 2014-08-17 LAB — CMP (CANCER CENTER ONLY)
ALK PHOS: 49 U/L (ref 26–84)
ALT(SGPT): 17 U/L (ref 10–47)
AST: 22 U/L (ref 11–38)
Albumin: 3.8 g/dL (ref 3.3–5.5)
BILIRUBIN TOTAL: 0.8 mg/dL (ref 0.20–1.60)
BUN, Bld: 10 mg/dL (ref 7–22)
CO2: 25 mEq/L (ref 18–33)
CREATININE: 1.3 mg/dL — AB (ref 0.6–1.2)
Calcium: 8.9 mg/dL (ref 8.0–10.3)
Chloride: 102 mEq/L (ref 98–108)
Glucose, Bld: 134 mg/dL — ABNORMAL HIGH (ref 73–118)
Potassium: 3.5 mEq/L (ref 3.3–4.7)
SODIUM: 139 meq/L (ref 128–145)
TOTAL PROTEIN: 6.8 g/dL (ref 6.4–8.1)

## 2014-08-17 LAB — CBC WITH DIFFERENTIAL (CANCER CENTER ONLY)
BASO#: 0 10*3/uL (ref 0.0–0.2)
BASO%: 0.8 % (ref 0.0–2.0)
EOS ABS: 0.1 10*3/uL (ref 0.0–0.5)
EOS%: 2.1 % (ref 0.0–7.0)
HEMATOCRIT: 40.1 % (ref 38.7–49.9)
HGB: 14.4 g/dL (ref 13.0–17.1)
LYMPH#: 2.2 10*3/uL (ref 0.9–3.3)
LYMPH%: 42.9 % (ref 14.0–48.0)
MCH: 32.7 pg (ref 28.0–33.4)
MCHC: 35.9 g/dL (ref 32.0–35.9)
MCV: 91 fL (ref 82–98)
MONO#: 0.3 10*3/uL (ref 0.1–0.9)
MONO%: 6.4 % (ref 0.0–13.0)
NEUT#: 2.5 10*3/uL (ref 1.5–6.5)
NEUT%: 47.8 % (ref 40.0–80.0)
Platelets: 219 10*3/uL (ref 145–400)
RBC: 4.41 10*6/uL (ref 4.20–5.70)
RDW: 13.4 % (ref 11.1–15.7)
WBC: 5.1 10*3/uL (ref 4.0–10.0)

## 2014-08-17 LAB — IRON AND TIBC CHCC
%SAT: 42 % (ref 20–55)
Iron: 106 ug/dL (ref 42–163)
TIBC: 253 ug/dL (ref 202–409)
UIBC: 146 ug/dL (ref 117–376)

## 2014-08-17 LAB — FERRITIN CHCC: FERRITIN: 152 ng/mL (ref 22–316)

## 2014-08-17 MED ORDER — LACTULOSE 20 GM/30ML PO SOLN: 30.0000 mL | Freq: Three times a day (TID) | ORAL | Status: DC

## 2014-08-17 MED ORDER — FAMCICLOVIR 250 MG PO TABS: 250.0000 mg | ORAL_TABLET | Freq: Every day | ORAL | Status: DC

## 2014-08-17 MED ORDER — ONDANSETRON HCL 8 MG PO TABS
8.0000 mg | ORAL_TABLET | Freq: Once | ORAL | Status: AC
  Administered 2014-08-17: 8 mg via ORAL

## 2014-08-17 MED ORDER — BORTEZOMIB CHEMO SQ INJECTION 3.5 MG (2.5MG/ML)
1.3000 mg/m2 | Freq: Once | INTRAMUSCULAR | Status: AC
  Administered 2014-08-17: 2.75 mg via SUBCUTANEOUS
  Filled 2014-08-17: qty 2.75

## 2014-08-17 MED ORDER — ZOLEDRONIC ACID 4 MG/100ML IV SOLN
4.0000 mg | Freq: Once | INTRAVENOUS | Status: AC
  Administered 2014-08-17: 4 mg via INTRAVENOUS
  Filled 2014-08-17: qty 100

## 2014-08-17 MED ORDER — ONDANSETRON HCL 8 MG PO TABS
ORAL_TABLET | ORAL | Status: AC
  Filled 2014-08-17: qty 1

## 2014-08-17 NOTE — Patient Instructions (Signed)
Zoledronic Acid injection (Hypercalcemia, Oncology) What is this medicine? ZOLEDRONIC ACID (ZOE le dron ik AS id) lowers the amount of calcium loss from bone. It is used to treat too much calcium in your blood from cancer. It is also used to prevent complications of cancer that has spread to the bone. This medicine may be used for other purposes; ask your health care provider or pharmacist if you have questions. COMMON BRAND NAME(S): Zometa What should I tell my health care provider before I take this medicine? They need to know if you have any of these conditions: -aspirin-sensitive asthma -cancer, especially if you are receiving medicines used to treat cancer -dental disease or wear dentures -infection -kidney disease -receiving corticosteroids like dexamethasone or prednisone -an unusual or allergic reaction to zoledronic acid, other medicines, foods, dyes, or preservatives -pregnant or trying to get pregnant -breast-feeding How should I use this medicine? This medicine is for infusion into a vein. It is given by a health care professional in a hospital or clinic setting. Talk to your pediatrician regarding the use of this medicine in children. Special care may be needed. Overdosage: If you think you have taken too much of this medicine contact a poison control center or emergency room at once. NOTE: This medicine is only for you. Do not share this medicine with others. What if I miss a dose? It is important not to miss your dose. Call your doctor or health care professional if you are unable to keep an appointment. What may interact with this medicine? -certain antibiotics given by injection -NSAIDs, medicines for pain and inflammation, like ibuprofen or naproxen -some diuretics like bumetanide, furosemide -teriparatide -thalidomide This list may not describe all possible interactions. Give your health care provider a list of all the medicines, herbs, non-prescription drugs, or  dietary supplements you use. Also tell them if you smoke, drink alcohol, or use illegal drugs. Some items may interact with your medicine. What should I watch for while using this medicine? Visit your doctor or health care professional for regular checkups. It may be some time before you see the benefit from this medicine. Do not stop taking your medicine unless your doctor tells you to. Your doctor may order blood tests or other tests to see how you are doing. Women should inform their doctor if they wish to become pregnant or think they might be pregnant. There is a potential for serious side effects to an unborn child. Talk to your health care professional or pharmacist for more information. You should make sure that you get enough calcium and vitamin D while you are taking this medicine. Discuss the foods you eat and the vitamins you take with your health care professional. Some people who take this medicine have severe bone, joint, and/or muscle pain. This medicine may also increase your risk for jaw problems or a broken thigh bone. Tell your doctor right away if you have severe pain in your jaw, bones, joints, or muscles. Tell your doctor if you have any pain that does not go away or that gets worse. Tell your dentist and dental surgeon that you are taking this medicine. You should not have major dental surgery while on this medicine. See your dentist to have a dental exam and fix any dental problems before starting this medicine. Take good care of your teeth while on this medicine. Make sure you see your dentist for regular follow-up appointments. What side effects may I notice from receiving this medicine? Side effects that   you should report to your doctor or health care professional as soon as possible: -allergic reactions like skin rash, itching or hives, swelling of the face, lips, or tongue -anxiety, confusion, or depression -breathing problems -changes in vision -eye pain -feeling faint or  lightheaded, falls -jaw pain, especially after dental work -mouth sores -muscle cramps, stiffness, or weakness -trouble passing urine or change in the amount of urine Side effects that usually do not require medical attention (report to your doctor or health care professional if they continue or are bothersome): -bone, joint, or muscle pain -constipation -diarrhea -fever -hair loss -irritation at site where injected -loss of appetite -nausea, vomiting -stomach upset -trouble sleeping -trouble swallowing -weak or tired This list may not describe all possible side effects. Call your doctor for medical advice about side effects. You may report side effects to FDA at 1-800-FDA-1088. Where should I keep my medicine? This drug is given in a hospital or clinic and will not be stored at home. NOTE: This sheet is a summary. It may not cover all possible information. If you have questions about this medicine, talk to your doctor, pharmacist, or health care provider.  2015, Elsevier/Gold Standard. (2013-04-20 13:03:13)  Bortezomib injection What is this medicine? BORTEZOMIB (bor TEZ oh mib) is a chemotherapy drug. It slows the growth of cancer cells. This medicine is used to treat multiple myeloma, and certain lymphomas, such as mantle-cell lymphoma. This medicine may be used for other purposes; ask your health care provider or pharmacist if you have questions. COMMON BRAND NAME(S): Velcade What should I tell my health care provider before I take this medicine? They need to know if you have any of these conditions: -diabetes -heart disease -irregular heartbeat -liver disease -on hemodialysis -low blood counts, like low white blood cells, platelets, or hemoglobin -peripheral neuropathy -taking medicine for blood pressure -an unusual or allergic reaction to bortezomib, mannitol, boron, other medicines, foods, dyes, or preservatives -pregnant or trying to get pregnant -breast-feeding How  should I use this medicine? This medicine is for injection into a vein or for injection under the skin. It is given by a health care professional in a hospital or clinic setting. Talk to your pediatrician regarding the use of this medicine in children. Special care may be needed. Overdosage: If you think you have taken too much of this medicine contact a poison control center or emergency room at once. NOTE: This medicine is only for you. Do not share this medicine with others. What if I miss a dose? It is important not to miss your dose. Call your doctor or health care professional if you are unable to keep an appointment. What may interact with this medicine? This medicine may interact with the following medications: -ketoconazole -rifampin -ritonavir -St. John's Wort This list may not describe all possible interactions. Give your health care provider a list of all the medicines, herbs, non-prescription drugs, or dietary supplements you use. Also tell them if you smoke, drink alcohol, or use illegal drugs. Some items may interact with your medicine. What should I watch for while using this medicine? Visit your doctor for checks on your progress. This drug may make you feel generally unwell. This is not uncommon, as chemotherapy can affect healthy cells as well as cancer cells. Report any side effects. Continue your course of treatment even though you feel ill unless your doctor tells you to stop. You may get drowsy or dizzy. Do not drive, use machinery, or do anything that needs mental   until you know how this medicine affects you. Do not stand or sit up quickly, especially if you are an older patient. This reduces the risk of dizzy or fainting spells. In some cases, you may be given additional medicines to help with side effects. Follow all directions for their use. Call your doctor or health care professional for advice if you get a fever, chills or sore throat, or other symptoms of a  cold or flu. Do not treat yourself. This drug decreases your body's ability to fight infections. Try to avoid being around people who are sick. This medicine may increase your risk to bruise or bleed. Call your doctor or health care professional if you notice any unusual bleeding. You may need blood work done while you are taking this medicine. In some patients, this medicine may cause a serious brain infection that may cause death. If you have any problems seeing, thinking, speaking, walking, or standing, tell your doctor right away. If you cannot reach your doctor, urgently seek other source of medical care. Do not become pregnant while taking this medicine. Women should inform their doctor if they wish to become pregnant or think they might be pregnant. There is a potential for serious side effects to an unborn child. Talk to your health care professional or pharmacist for more information. Do not breast-feed an infant while taking this medicine. Check with your doctor or health care professional if you get an attack of severe diarrhea, nausea and vomiting, or if you sweat a lot. The loss of too much body fluid can make it dangerous for you to take this medicine. What side effects may I notice from receiving this medicine? Side effects that you should report to your doctor or health care professional as soon as possible: -allergic reactions like skin rash, itching or hives, swelling of the face, lips, or tongue -breathing problems -changes in hearing -changes in vision -fast, irregular heartbeat -feeling faint or lightheaded, falls -pain, tingling, numbness in the hands or feet -right upper belly pain -seizures -swelling of the ankles, feet, hands -unusual bleeding or bruising -unusually weak or tired -vomiting -yellowing of the eyes or skin Side effects that usually do not require medical attention (report to your doctor or health care professional if they continue or are  bothersome): -changes in emotions or moods -constipation -diarrhea -loss of appetite -headache -irritation at site where injected -nausea This list may not describe all possible side effects. Call your doctor for medical advice about side effects. You may report side effects to FDA at 1-800-FDA-1088. Where should I keep my medicine? This drug is given in a hospital or clinic and will not be stored at home. NOTE: This sheet is a summary. It may not cover all possible information. If you have questions about this medicine, talk to your doctor, pharmacist, or health care provider.  2015, Elsevier/Gold Standard. (2013-09-04 12:46:32)

## 2014-08-17 NOTE — Progress Notes (Signed)
error 

## 2014-08-20 ENCOUNTER — Telehealth: Payer: Self-pay | Admitting: Hematology & Oncology

## 2014-08-20 NOTE — Telephone Encounter (Signed)
Per MD ok to move oct tx to wends. Pt is aware 10-28 at [facility]

## 2014-08-21 ENCOUNTER — Other Ambulatory Visit: Payer: Self-pay | Admitting: *Deleted

## 2014-08-21 DIAGNOSIS — C9000 Multiple myeloma not having achieved remission: Secondary | ICD-10-CM

## 2014-08-21 LAB — KAPPA/LAMBDA LIGHT CHAINS
Kappa free light chain: 3.79 mg/dL — ABNORMAL HIGH (ref 0.33–1.94)
Kappa:Lambda Ratio: 2.79 — ABNORMAL HIGH (ref 0.26–1.65)
Lambda Free Lght Chn: 1.36 mg/dL (ref 0.57–2.63)

## 2014-08-21 LAB — PROTEIN ELECTROPHORESIS, SERUM, WITH REFLEX
ALPHA-1-GLOBULIN: 3.6 % (ref 2.9–4.9)
ALPHA-2-GLOBULIN: 7.5 % (ref 7.1–11.8)
Albumin ELP: 63 % (ref 55.8–66.1)
BETA 2: 3 % — AB (ref 3.2–6.5)
Beta Globulin: 5 % (ref 4.7–7.2)
Gamma Globulin: 17.9 % (ref 11.1–18.8)
M-SPIKE, %: 0.29 g/dL
Total Protein, Serum Electrophoresis: 6.6 g/dL (ref 6.0–8.3)

## 2014-08-21 LAB — IGG, IGA, IGM
IGA: 26 mg/dL — AB (ref 68–379)
IGM, SERUM: 38 mg/dL — AB (ref 41–251)
IgG (Immunoglobin G), Serum: 1170 mg/dL (ref 650–1600)

## 2014-08-21 LAB — LACTATE DEHYDROGENASE: LDH: 141 U/L (ref 94–250)

## 2014-08-21 LAB — BETA 2 MICROGLOBULIN, SERUM: BETA 2 MICROGLOBULIN: 1.47 mg/L (ref <=2.51)

## 2014-08-21 LAB — IFE INTERPRETATION

## 2014-08-22 ENCOUNTER — Ambulatory Visit (HOSPITAL_BASED_OUTPATIENT_CLINIC_OR_DEPARTMENT_OTHER): Payer: BC Managed Care – PPO | Admitting: Lab

## 2014-08-22 ENCOUNTER — Ambulatory Visit (HOSPITAL_BASED_OUTPATIENT_CLINIC_OR_DEPARTMENT_OTHER): Payer: BC Managed Care – PPO

## 2014-08-22 VITALS — BP 147/84 | HR 62 | Temp 98.1°F

## 2014-08-22 DIAGNOSIS — C9000 Multiple myeloma not having achieved remission: Secondary | ICD-10-CM

## 2014-08-22 DIAGNOSIS — Z5112 Encounter for antineoplastic immunotherapy: Secondary | ICD-10-CM

## 2014-08-22 LAB — CMP (CANCER CENTER ONLY)
ALBUMIN: 3.7 g/dL (ref 3.3–5.5)
ALT: 16 U/L (ref 10–47)
AST: 16 U/L (ref 11–38)
Alkaline Phosphatase: 43 U/L (ref 26–84)
BUN, Bld: 10 mg/dL (ref 7–22)
CALCIUM: 9 mg/dL (ref 8.0–10.3)
CO2: 24 meq/L (ref 18–33)
Chloride: 102 mEq/L (ref 98–108)
Creat: 1 mg/dl (ref 0.6–1.2)
Glucose, Bld: 92 mg/dL (ref 73–118)
POTASSIUM: 3.3 meq/L (ref 3.3–4.7)
SODIUM: 137 meq/L (ref 128–145)
TOTAL PROTEIN: 6.8 g/dL (ref 6.4–8.1)
Total Bilirubin: 0.6 mg/dl (ref 0.20–1.60)

## 2014-08-22 LAB — CBC WITH DIFFERENTIAL (CANCER CENTER ONLY)
BASO#: 0 10*3/uL (ref 0.0–0.2)
BASO%: 0.5 % (ref 0.0–2.0)
EOS%: 4.4 % (ref 0.0–7.0)
Eosinophils Absolute: 0.3 10*3/uL (ref 0.0–0.5)
HCT: 38.1 % — ABNORMAL LOW (ref 38.7–49.9)
HGB: 13.7 g/dL (ref 13.0–17.1)
LYMPH#: 1.4 10*3/uL (ref 0.9–3.3)
LYMPH%: 22.8 % (ref 14.0–48.0)
MCH: 33 pg (ref 28.0–33.4)
MCHC: 36 g/dL — AB (ref 32.0–35.9)
MCV: 92 fL (ref 82–98)
MONO#: 0.6 10*3/uL (ref 0.1–0.9)
MONO%: 10 % (ref 0.0–13.0)
NEUT%: 62.3 % (ref 40.0–80.0)
NEUTROS ABS: 3.8 10*3/uL (ref 1.5–6.5)
PLATELETS: 196 10*3/uL (ref 145–400)
RBC: 4.15 10*6/uL — ABNORMAL LOW (ref 4.20–5.70)
RDW: 13.3 % (ref 11.1–15.7)
WBC: 6.1 10*3/uL (ref 4.0–10.0)

## 2014-08-22 MED ORDER — BORTEZOMIB CHEMO SQ INJECTION 3.5 MG (2.5MG/ML)
1.3000 mg/m2 | Freq: Once | INTRAMUSCULAR | Status: AC
  Administered 2014-08-22: 2.75 mg via SUBCUTANEOUS
  Filled 2014-08-22: qty 2.75

## 2014-08-22 MED ORDER — ONDANSETRON HCL 8 MG PO TABS
ORAL_TABLET | ORAL | Status: AC
  Filled 2014-08-22: qty 1

## 2014-08-22 MED ORDER — ONDANSETRON HCL 8 MG PO TABS
8.0000 mg | ORAL_TABLET | Freq: Once | ORAL | Status: AC
  Administered 2014-08-22: 8 mg via ORAL

## 2014-08-22 NOTE — Patient Instructions (Signed)
Bortezomib injection What is this medicine? BORTEZOMIB (bor TEZ oh mib) is a chemotherapy drug. It slows the growth of cancer cells. This medicine is used to treat multiple myeloma, and certain lymphomas, such as mantle-cell lymphoma. This medicine may be used for other purposes; ask your health care provider or pharmacist if you have questions. COMMON BRAND NAME(S): Velcade What should I tell my health care provider before I take this medicine? They need to know if you have any of these conditions: -diabetes -heart disease -irregular heartbeat -liver disease -on hemodialysis -low blood counts, like low white blood cells, platelets, or hemoglobin -peripheral neuropathy -taking medicine for blood pressure -an unusual or allergic reaction to bortezomib, mannitol, boron, other medicines, foods, dyes, or preservatives -pregnant or trying to get pregnant -breast-feeding How should I use this medicine? This medicine is for injection into a vein or for injection under the skin. It is given by a health care professional in a hospital or clinic setting. Talk to your pediatrician regarding the use of this medicine in children. Special care may be needed. Overdosage: If you think you have taken too much of this medicine contact a poison control center or emergency room at once. NOTE: This medicine is only for you. Do not share this medicine with others. What if I miss a dose? It is important not to miss your dose. Call your doctor or health care professional if you are unable to keep an appointment. What may interact with this medicine? This medicine may interact with the following medications: -ketoconazole -rifampin -ritonavir -St. John's Wort This list may not describe all possible interactions. Give your health care provider a list of all the medicines, herbs, non-prescription drugs, or dietary supplements you use. Also tell them if you smoke, drink alcohol, or use illegal drugs. Some items  may interact with your medicine. What should I watch for while using this medicine? Visit your doctor for checks on your progress. This drug may make you feel generally unwell. This is not uncommon, as chemotherapy can affect healthy cells as well as cancer cells. Report any side effects. Continue your course of treatment even though you feel ill unless your doctor tells you to stop. You may get drowsy or dizzy. Do not drive, use machinery, or do anything that needs mental alertness until you know how this medicine affects you. Do not stand or sit up quickly, especially if you are an older patient. This reduces the risk of dizzy or fainting spells. In some cases, you may be given additional medicines to help with side effects. Follow all directions for their use. Call your doctor or health care professional for advice if you get a fever, chills or sore throat, or other symptoms of a cold or flu. Do not treat yourself. This drug decreases your body's ability to fight infections. Try to avoid being around people who are sick. This medicine may increase your risk to bruise or bleed. Call your doctor or health care professional if you notice any unusual bleeding. You may need blood work done while you are taking this medicine. In some patients, this medicine may cause a serious brain infection that may cause death. If you have any problems seeing, thinking, speaking, walking, or standing, tell your doctor right away. If you cannot reach your doctor, urgently seek other source of medical care. Do not become pregnant while taking this medicine. Women should inform their doctor if they wish to become pregnant or think they might be pregnant. There is   a potential for serious side effects to an unborn child. Talk to your health care professional or pharmacist for more information. Do not breast-feed an infant while taking this medicine. Check with your doctor or health care professional if you get an attack of  severe diarrhea, nausea and vomiting, or if you sweat a lot. The loss of too much body fluid can make it dangerous for you to take this medicine. What side effects may I notice from receiving this medicine? Side effects that you should report to your doctor or health care professional as soon as possible: -allergic reactions like skin rash, itching or hives, swelling of the face, lips, or tongue -breathing problems -changes in hearing -changes in vision -fast, irregular heartbeat -feeling faint or lightheaded, falls -pain, tingling, numbness in the hands or feet -right upper belly pain -seizures -swelling of the ankles, feet, hands -unusual bleeding or bruising -unusually weak or tired -vomiting -yellowing of the eyes or skin Side effects that usually do not require medical attention (report to your doctor or health care professional if they continue or are bothersome): -changes in emotions or moods -constipation -diarrhea -loss of appetite -headache -irritation at site where injected -nausea This list may not describe all possible side effects. Call your doctor for medical advice about side effects. You may report side effects to FDA at 1-800-FDA-1088. Where should I keep my medicine? This drug is given in a hospital or clinic and will not be stored at home. NOTE: This sheet is a summary. It may not cover all possible information. If you have questions about this medicine, talk to your doctor, pharmacist, or health care provider.  2015, [employer]. (2013-09-04 12:46:32)  

## 2014-08-24 ENCOUNTER — Ambulatory Visit: Payer: BC Managed Care – PPO

## 2014-08-24 ENCOUNTER — Other Ambulatory Visit: Payer: BC Managed Care – PPO | Admitting: Lab

## 2014-08-24 LAB — PROTEIN ELECTROPHORESIS, SERUM, WITH REFLEX
Albumin ELP: 59.6 % (ref 55.8–66.1)
Alpha-1-Globulin: 5.9 % — ABNORMAL HIGH (ref 2.9–4.9)
Alpha-2-Globulin: 8.8 % (ref 7.1–11.8)
Beta 2: 2.8 % — ABNORMAL LOW (ref 3.2–6.5)
Beta Globulin: 5.1 % (ref 4.7–7.2)
Gamma Globulin: 17.8 % (ref 11.1–18.8)
M-Spike, %: 0.29 g/dL
Total Protein, Serum Electrophoresis: 6.5 g/dL (ref 6.0–8.3)

## 2014-08-24 LAB — IGG, IGA, IGM
IGA: 19 mg/dL — AB (ref 68–379)
IgG (Immunoglobin G), Serum: 1160 mg/dL (ref 650–1600)
IgM, Serum: 24 mg/dL — ABNORMAL LOW (ref 41–251)

## 2014-08-24 LAB — LACTATE DEHYDROGENASE: LDH: 137 U/L (ref 94–250)

## 2014-08-24 LAB — KAPPA/LAMBDA LIGHT CHAINS
KAPPA LAMBDA RATIO: 3.55 — AB (ref 0.26–1.65)
Kappa free light chain: 5 mg/dL — ABNORMAL HIGH (ref 0.33–1.94)
Lambda Free Lght Chn: 1.41 mg/dL (ref 0.57–2.63)

## 2014-08-24 LAB — BETA 2 MICROGLOBULIN, SERUM: Beta-2 Microglobulin: 1.53 mg/L (ref <=2.51)

## 2014-08-24 LAB — IFE INTERPRETATION

## 2014-08-28 ENCOUNTER — Other Ambulatory Visit: Payer: Self-pay | Admitting: *Deleted

## 2014-08-28 DIAGNOSIS — C9000 Multiple myeloma not having achieved remission: Secondary | ICD-10-CM

## 2014-08-29 ENCOUNTER — Other Ambulatory Visit: Payer: Self-pay | Admitting: Internal Medicine

## 2014-08-29 ENCOUNTER — Other Ambulatory Visit: Payer: BC Managed Care – PPO | Admitting: Lab

## 2014-08-29 ENCOUNTER — Ambulatory Visit (HOSPITAL_BASED_OUTPATIENT_CLINIC_OR_DEPARTMENT_OTHER): Payer: BC Managed Care – PPO

## 2014-08-29 VITALS — BP 156/95 | HR 59 | Temp 97.7°F | Resp 16

## 2014-08-29 DIAGNOSIS — Z5112 Encounter for antineoplastic immunotherapy: Secondary | ICD-10-CM

## 2014-08-29 DIAGNOSIS — C9000 Multiple myeloma not having achieved remission: Secondary | ICD-10-CM

## 2014-08-29 MED ORDER — BORTEZOMIB CHEMO SQ INJECTION 3.5 MG (2.5MG/ML)
1.3000 mg/m2 | Freq: Once | INTRAMUSCULAR | Status: AC
  Administered 2014-08-29: 2.75 mg via SUBCUTANEOUS
  Filled 2014-08-29: qty 2.75

## 2014-08-29 MED ORDER — ONDANSETRON HCL 8 MG PO TABS
8.0000 mg | ORAL_TABLET | Freq: Once | ORAL | Status: AC
  Administered 2014-08-29: 8 mg via ORAL

## 2014-08-29 MED ORDER — ONDANSETRON HCL 8 MG PO TABS
ORAL_TABLET | ORAL | Status: AC
  Filled 2014-08-29: qty 1

## 2014-08-29 NOTE — Progress Notes (Signed)
Per Dr Marin Olp, pt is okay to receive velcade today based off the labs from last week.

## 2014-08-29 NOTE — Patient Instructions (Signed)
Bortezomib injection What is this medicine? BORTEZOMIB (bor TEZ oh mib) is a chemotherapy drug. It slows the growth of cancer cells. This medicine is used to treat multiple myeloma, and certain lymphomas, such as mantle-cell lymphoma. This medicine may be used for other purposes; ask your health care provider or pharmacist if you have questions. COMMON BRAND NAME(S): Velcade What should I tell my health care provider before I take this medicine? They need to know if you have any of these conditions: -diabetes -heart disease -irregular heartbeat -liver disease -on hemodialysis -low blood counts, like low white blood cells, platelets, or hemoglobin -peripheral neuropathy -taking medicine for blood pressure -an unusual or allergic reaction to bortezomib, mannitol, boron, other medicines, foods, dyes, or preservatives -pregnant or trying to get pregnant -breast-feeding How should I use this medicine? This medicine is for injection into a vein or for injection under the skin. It is given by a health care professional in a hospital or clinic setting. Talk to your pediatrician regarding the use of this medicine in children. Special care may be needed. Overdosage: If you think you have taken too much of this medicine contact a poison control center or emergency room at once. NOTE: This medicine is only for you. Do not share this medicine with others. What if I miss a dose? It is important not to miss your dose. Call your doctor or health care professional if you are unable to keep an appointment. What may interact with this medicine? This medicine may interact with the following medications: -ketoconazole -rifampin -ritonavir -St. John's Wort This list may not describe all possible interactions. Give your health care provider a list of all the medicines, herbs, non-prescription drugs, or dietary supplements you use. Also tell them if you smoke, drink alcohol, or use illegal drugs. Some items  may interact with your medicine. What should I watch for while using this medicine? Visit your doctor for checks on your progress. This drug may make you feel generally unwell. This is not uncommon, as chemotherapy can affect healthy cells as well as cancer cells. Report any side effects. Continue your course of treatment even though you feel ill unless your doctor tells you to stop. You may get drowsy or dizzy. Do not drive, use machinery, or do anything that needs mental alertness until you know how this medicine affects you. Do not stand or sit up quickly, especially if you are an older patient. This reduces the risk of dizzy or fainting spells. In some cases, you may be given additional medicines to help with side effects. Follow all directions for their use. Call your doctor or health care professional for advice if you get a fever, chills or sore throat, or other symptoms of a cold or flu. Do not treat yourself. This drug decreases your body's ability to fight infections. Try to avoid being around people who are sick. This medicine may increase your risk to bruise or bleed. Call your doctor or health care professional if you notice any unusual bleeding. You may need blood work done while you are taking this medicine. In some patients, this medicine may cause a serious brain infection that may cause death. If you have any problems seeing, thinking, speaking, walking, or standing, tell your doctor right away. If you cannot reach your doctor, urgently seek other source of medical care. Do not become pregnant while taking this medicine. Women should inform their doctor if they wish to become pregnant or think they might be pregnant. There is   a potential for serious side effects to an unborn child. Talk to your health care professional or pharmacist for more information. Do not breast-feed an infant while taking this medicine. Check with your doctor or health care professional if you get an attack of  severe diarrhea, nausea and vomiting, or if you sweat a lot. The loss of too much body fluid can make it dangerous for you to take this medicine. What side effects may I notice from receiving this medicine? Side effects that you should report to your doctor or health care professional as soon as possible: -allergic reactions like skin rash, itching or hives, swelling of the face, lips, or tongue -breathing problems -changes in hearing -changes in vision -fast, irregular heartbeat -feeling faint or lightheaded, falls -pain, tingling, numbness in the hands or feet -right upper belly pain -seizures -swelling of the ankles, feet, hands -unusual bleeding or bruising -unusually weak or tired -vomiting -yellowing of the eyes or skin Side effects that usually do not require medical attention (report to your doctor or health care professional if they continue or are bothersome): -changes in emotions or moods -constipation -diarrhea -loss of appetite -headache -irritation at site where injected -nausea This list may not describe all possible side effects. Call your doctor for medical advice about side effects. You may report side effects to FDA at 1-800-FDA-1088. Where should I keep my medicine? This drug is given in a hospital or clinic and will not be stored at home. NOTE: This sheet is a summary. It may not cover all possible information. If you have questions about this medicine, talk to your doctor, pharmacist, or health care provider.  2015, [employer]. (2013-09-04 12:46:32)  

## 2014-08-31 ENCOUNTER — Ambulatory Visit: Payer: BC Managed Care – PPO

## 2014-08-31 ENCOUNTER — Other Ambulatory Visit: Payer: BC Managed Care – PPO | Admitting: Lab

## 2014-09-07 ENCOUNTER — Ambulatory Visit: Payer: BC Managed Care – PPO | Admitting: Family

## 2014-09-10 ENCOUNTER — Ambulatory Visit: Payer: BC Managed Care – PPO

## 2014-09-10 ENCOUNTER — Other Ambulatory Visit: Payer: BC Managed Care – PPO | Admitting: Lab

## 2014-09-10 ENCOUNTER — Ambulatory Visit: Payer: BC Managed Care – PPO | Admitting: Family

## 2014-09-18 ENCOUNTER — Encounter (HOSPITAL_COMMUNITY): Payer: Self-pay

## 2014-09-19 ENCOUNTER — Ambulatory Visit (HOSPITAL_BASED_OUTPATIENT_CLINIC_OR_DEPARTMENT_OTHER): Payer: BC Managed Care – PPO

## 2014-09-19 ENCOUNTER — Other Ambulatory Visit (HOSPITAL_BASED_OUTPATIENT_CLINIC_OR_DEPARTMENT_OTHER): Payer: BC Managed Care – PPO

## 2014-09-19 ENCOUNTER — Ambulatory Visit: Payer: BC Managed Care – PPO

## 2014-09-19 ENCOUNTER — Ambulatory Visit: Payer: BC Managed Care – PPO | Admitting: Hematology & Oncology

## 2014-09-19 ENCOUNTER — Other Ambulatory Visit: Payer: BC Managed Care – PPO | Admitting: Lab

## 2014-09-19 VITALS — BP 120/66 | HR 60 | Temp 98.6°F | Resp 18

## 2014-09-19 DIAGNOSIS — Z5112 Encounter for antineoplastic immunotherapy: Secondary | ICD-10-CM

## 2014-09-19 DIAGNOSIS — C9000 Multiple myeloma not having achieved remission: Secondary | ICD-10-CM

## 2014-09-19 LAB — CBC WITH DIFFERENTIAL/PLATELET
BASO%: 2 % (ref 0.0–2.0)
Basophils Absolute: 0.1 10*3/uL (ref 0.0–0.1)
EOS%: 6.4 % (ref 0.0–7.0)
Eosinophils Absolute: 0.3 10*3/uL (ref 0.0–0.5)
HEMATOCRIT: 39.3 % (ref 38.4–49.9)
HGB: 14 g/dL (ref 13.0–17.1)
LYMPH%: 34.3 % (ref 14.0–49.0)
MCH: 32.2 pg (ref 27.2–33.4)
MCHC: 35.6 g/dL (ref 32.0–36.0)
MCV: 90.3 fL (ref 79.3–98.0)
MONO#: 0.4 10*3/uL (ref 0.1–0.9)
MONO%: 9 % (ref 0.0–14.0)
NEUT#: 2.2 10*3/uL (ref 1.5–6.5)
NEUT%: 48.3 % (ref 39.0–75.0)
PLATELETS: 232 10*3/uL (ref 140–400)
RBC: 4.35 10*6/uL (ref 4.20–5.82)
RDW: 13.7 % (ref 11.0–14.6)
WBC: 4.6 10*3/uL (ref 4.0–10.3)
lymph#: 1.6 10*3/uL (ref 0.9–3.3)

## 2014-09-19 LAB — COMPREHENSIVE METABOLIC PANEL (CC13)
ALK PHOS: 51 U/L (ref 40–150)
ALT: 16 U/L (ref 0–55)
AST: 12 U/L (ref 5–34)
Albumin: 3.9 g/dL (ref 3.5–5.0)
Anion Gap: 9 mEq/L (ref 3–11)
BILIRUBIN TOTAL: 0.49 mg/dL (ref 0.20–1.20)
BUN: 7.6 mg/dL (ref 7.0–26.0)
CO2: 23 mEq/L (ref 22–29)
CREATININE: 1.2 mg/dL (ref 0.7–1.3)
Calcium: 8.9 mg/dL (ref 8.4–10.4)
Chloride: 109 mEq/L (ref 98–109)
Glucose: 98 mg/dl (ref 70–140)
Potassium: 3.5 mEq/L (ref 3.5–5.1)
SODIUM: 141 meq/L (ref 136–145)
TOTAL PROTEIN: 6.7 g/dL (ref 6.4–8.3)

## 2014-09-19 LAB — LACTATE DEHYDROGENASE (CC13): LDH: 146 U/L (ref 125–245)

## 2014-09-19 MED ORDER — ONDANSETRON HCL 8 MG PO TABS
ORAL_TABLET | ORAL | Status: AC
  Filled 2014-09-19: qty 1

## 2014-09-19 MED ORDER — BORTEZOMIB CHEMO SQ INJECTION 3.5 MG (2.5MG/ML)
1.3000 mg/m2 | Freq: Once | INTRAMUSCULAR | Status: AC
  Administered 2014-09-19: 2.75 mg via SUBCUTANEOUS
  Filled 2014-09-19: qty 2.75

## 2014-09-19 MED ORDER — ONDANSETRON HCL 8 MG PO TABS
8.0000 mg | ORAL_TABLET | Freq: Once | ORAL | Status: AC
  Administered 2014-09-19: 8 mg via ORAL

## 2014-09-19 NOTE — Patient Instructions (Signed)
[facility] Discharge Instructions for Patients Receiving Chemotherapy  Today you received the following chemotherapy agents Velcade.  To help prevent nausea and vomiting after your treatment, we encourage you to take your nausea medication as directed.    If you develop nausea and vomiting that is not controlled by your nausea medication, call the clinic.   BELOW ARE SYMPTOMS THAT SHOULD BE REPORTED IMMEDIATELY:  *FEVER GREATER THAN 100.5 F  *CHILLS WITH OR WITHOUT FEVER  NAUSEA AND VOMITING THAT IS NOT CONTROLLED WITH YOUR NAUSEA MEDICATION  *UNUSUAL SHORTNESS OF BREATH  *UNUSUAL BRUISING OR BLEEDING  TENDERNESS IN MOUTH AND THROAT WITH OR WITHOUT PRESENCE OF ULCERS  *URINARY PROBLEMS  *BOWEL PROBLEMS  UNUSUAL RASH Items with * indicate a potential emergency and should be followed up as soon as possible.  Feel free to call the clinic you have any questions or concerns. The clinic phone number is [phone].    

## 2014-09-20 LAB — KAPPA/LAMBDA LIGHT CHAINS
KAPPA FREE LGHT CHN: 3.32 mg/dL — AB (ref 0.33–1.94)
KAPPA LAMBDA RATIO: 2.13 — AB (ref 0.26–1.65)
Lambda Free Lght Chn: 1.56 mg/dL (ref 0.57–2.63)

## 2014-09-21 LAB — PROTEIN ELECTROPHORESIS, SERUM
ALPHA-1-GLOBULIN: 4 % (ref 2.9–4.9)
ALPHA-2-GLOBULIN: 7.3 % (ref 7.1–11.8)
Albumin ELP: 61.8 % (ref 55.8–66.1)
Beta 2: 3.4 % (ref 3.2–6.5)
Beta Globulin: 4.8 % (ref 4.7–7.2)
Gamma Globulin: 18.7 % (ref 11.1–18.8)
M-SPIKE, %: 0.27 g/dL
TOTAL PROTEIN, SERUM ELECTROPHOR: 6.7 g/dL (ref 6.0–8.3)

## 2014-09-21 LAB — IGG, IGA, IGM
IGA: 29 mg/dL — AB (ref 68–379)
IgG (Immunoglobin G), Serum: 1320 mg/dL (ref 650–1600)
IgM, Serum: 35 mg/dL — ABNORMAL LOW (ref 41–251)

## 2014-09-21 LAB — BETA 2 MICROGLOBULIN, SERUM: BETA 2 MICROGLOBULIN: 1.35 mg/L (ref <=2.51)

## 2014-09-27 ENCOUNTER — Other Ambulatory Visit: Payer: Self-pay | Admitting: *Deleted

## 2014-09-27 DIAGNOSIS — N521 Erectile dysfunction due to diseases classified elsewhere: Secondary | ICD-10-CM

## 2014-09-27 DIAGNOSIS — C9000 Multiple myeloma not having achieved remission: Secondary | ICD-10-CM

## 2014-09-27 DIAGNOSIS — K5901 Slow transit constipation: Secondary | ICD-10-CM

## 2014-09-27 MED ORDER — LENALIDOMIDE 25 MG PO CAPS: 25.0000 mg | ORAL_CAPSULE | Freq: Every day | ORAL | Status: DC

## 2014-09-28 ENCOUNTER — Encounter: Payer: Self-pay | Admitting: Family

## 2014-09-28 ENCOUNTER — Ambulatory Visit (INDEPENDENT_AMBULATORY_CARE_PROVIDER_SITE_OTHER): Payer: BC Managed Care – PPO | Admitting: Family

## 2014-09-28 ENCOUNTER — Ambulatory Visit: Payer: BC Managed Care – PPO

## 2014-09-28 ENCOUNTER — Encounter: Payer: Self-pay | Admitting: Hematology & Oncology

## 2014-09-28 ENCOUNTER — Other Ambulatory Visit: Payer: BC Managed Care – PPO | Admitting: Lab

## 2014-09-28 ENCOUNTER — Ambulatory Visit (HOSPITAL_BASED_OUTPATIENT_CLINIC_OR_DEPARTMENT_OTHER): Payer: BC Managed Care – PPO | Admitting: Family

## 2014-09-28 ENCOUNTER — Other Ambulatory Visit (HOSPITAL_BASED_OUTPATIENT_CLINIC_OR_DEPARTMENT_OTHER): Payer: BC Managed Care – PPO | Admitting: Lab

## 2014-09-28 ENCOUNTER — Ambulatory Visit (HOSPITAL_BASED_OUTPATIENT_CLINIC_OR_DEPARTMENT_OTHER): Payer: BC Managed Care – PPO

## 2014-09-28 VITALS — BP 130/86 | HR 59 | Temp 97.8°F | Resp 16 | Ht 72.0 in | Wt 202.2 lb

## 2014-09-28 VITALS — BP 144/87 | HR 87 | Temp 98.2°F | Resp 18 | Ht 72.0 in | Wt 200.0 lb

## 2014-09-28 DIAGNOSIS — E785 Hyperlipidemia, unspecified: Secondary | ICD-10-CM

## 2014-09-28 DIAGNOSIS — E291 Testicular hypofunction: Secondary | ICD-10-CM

## 2014-09-28 DIAGNOSIS — I10 Essential (primary) hypertension: Secondary | ICD-10-CM

## 2014-09-28 DIAGNOSIS — Z0000 Encounter for general adult medical examination without abnormal findings: Secondary | ICD-10-CM

## 2014-09-28 DIAGNOSIS — C9000 Multiple myeloma not having achieved remission: Secondary | ICD-10-CM

## 2014-09-28 DIAGNOSIS — Z5112 Encounter for antineoplastic immunotherapy: Secondary | ICD-10-CM

## 2014-09-28 DIAGNOSIS — R7301 Impaired fasting glucose: Secondary | ICD-10-CM

## 2014-09-28 LAB — CMP (CANCER CENTER ONLY)
ALBUMIN: 3.8 g/dL (ref 3.3–5.5)
ALK PHOS: 33 U/L (ref 26–84)
ALT(SGPT): 26 U/L (ref 10–47)
AST: 20 U/L (ref 11–38)
BILIRUBIN TOTAL: 1 mg/dL (ref 0.20–1.60)
BUN: 9 mg/dL (ref 7–22)
CO2: 28 mEq/L (ref 18–33)
Calcium: 8.9 mg/dL (ref 8.0–10.3)
Chloride: 98 mEq/L (ref 98–108)
Creat: 1.1 mg/dl (ref 0.6–1.2)
Glucose, Bld: 91 mg/dL (ref 73–118)
POTASSIUM: 3.6 meq/L (ref 3.3–4.7)
SODIUM: 137 meq/L (ref 128–145)
Total Protein: 6.8 g/dL (ref 6.4–8.1)

## 2014-09-28 LAB — CBC WITH DIFFERENTIAL (CANCER CENTER ONLY)
BASO#: 0 10*3/uL (ref 0.0–0.2)
BASO%: 0.6 % (ref 0.0–2.0)
EOS%: 5.8 % (ref 0.0–7.0)
Eosinophils Absolute: 0.3 10*3/uL (ref 0.0–0.5)
HCT: 40.8 % (ref 38.7–49.9)
HGB: 14.5 g/dL (ref 13.0–17.1)
LYMPH#: 1.7 10*3/uL (ref 0.9–3.3)
LYMPH%: 35.2 % (ref 14.0–48.0)
MCH: 32.3 pg (ref 28.0–33.4)
MCHC: 35.5 g/dL (ref 32.0–35.9)
MCV: 91 fL (ref 82–98)
MONO#: 0.7 10*3/uL (ref 0.1–0.9)
MONO%: 13.7 % — AB (ref 0.0–13.0)
NEUT#: 2.2 10*3/uL (ref 1.5–6.5)
NEUT%: 44.7 % (ref 40.0–80.0)
PLATELETS: 173 10*3/uL (ref 145–400)
RBC: 4.49 10*6/uL (ref 4.20–5.70)
RDW: 12.7 % (ref 11.1–15.7)
WBC: 4.8 10*3/uL (ref 4.0–10.0)

## 2014-09-28 LAB — LIPID PANEL
Cholesterol: 122 mg/dL (ref 0–200)
HDL: 36.3 mg/dL — AB (ref >39.00)
LDL CALC: 69 mg/dL (ref 0–99)
NonHDL: 85.7
TRIGLYCERIDES: 85 mg/dL (ref 0.0–149.0)
Total CHOL/HDL Ratio: 3
VLDL: 17 mg/dL (ref 0.0–40.0)

## 2014-09-28 LAB — VITAMIN D 25 HYDROXY (VIT D DEFICIENCY, FRACTURES): VITD: 13.46 ng/mL — ABNORMAL LOW (ref 30.00–100.00)

## 2014-09-28 MED ORDER — ONDANSETRON HCL 8 MG PO TABS
8.0000 mg | ORAL_TABLET | Freq: Once | ORAL | Status: AC
Start: 2014-09-28 — End: 2014-09-28
  Administered 2014-09-28: 8 mg via ORAL

## 2014-09-28 MED ORDER — ONDANSETRON HCL 8 MG PO TABS
ORAL_TABLET | ORAL | Status: AC
  Filled 2014-09-28: qty 1

## 2014-09-28 MED ORDER — BORTEZOMIB CHEMO SQ INJECTION 3.5 MG (2.5MG/ML)
1.3000 mg/m2 | Freq: Once | INTRAMUSCULAR | Status: AC
  Administered 2014-09-28: 2.75 mg via SUBCUTANEOUS
  Filled 2014-09-28: qty 2.75

## 2014-09-28 NOTE — Progress Notes (Signed)
Pre visit review using our clinic review tool, if applicable. No additional management support is needed unless otherwise documented below in the visit note. 

## 2014-09-28 NOTE — Assessment & Plan Note (Addendum)
Stable on current meds.  I have asked pt to check bp at home once daily for the next week and email me his readings.

## 2014-09-28 NOTE — Progress Notes (Signed)
Jimmy Day  [phone] [phone]  ID: Jimmy Day [place] OB: Oct 03, 1961 MR#: [id number]#:[id number] Patient Care Team: [name], NP as PCP - General (Internal Medicine)  DIAGNOSIS: IgG Kappa myeloma  INTERVAL HISTORY: Jimmy Day is here today for a follow-up. He is going to [facility] two weeks to get his port a cath placed. His transplant is set for in January after the holidays. He is feeling overwhelmed right now by this whole process and the transplant itself. We talked for a while about this and he seems to be feeling better. Lactulose has help with his constipation but he doesn't like the taste. He denies fever, chills, n/v, cough, rash, headache, dizziness, SOB, chest pain, palpitations, abdominal pain, diarrhea, blood in urine or stool. Has has had no pain or bleeding. No swelling, tenderness, numbness or tingling in his extremities. His appetite is good and he drinks lots of water. In September, his serum light chain was 5.00 mg/dL.   CURRENT TREATMENT: Velcade/Revlimid/Decadron  Zometa 4 mg IV every month  REVIEW OF SYSTEMS: All other 10 point review of systems is negative.   PAST MEDICAL HISTORY: Past Medical History  Diagnosis Date  . Fasting hyperglycemia     NORMAL A1c  . Hyperlipidemia   . Testosterone deficiency     Dr Hartley Barefoot  . Hypertension   . Kappa light chain myeloma 02/02/2014   PAST SURGICAL HISTORY: Past Surgical History  Procedure Laterality Date  . Lipomas removal      BENIGN FROM CHEST AND BACK  . Cervical plate insertion      POST COMPRESSION DISC INJURY   FAMILY HISTORY Family History  Problem Relation Age of Onset  . Breast cancer Mother   . Heart attack Father 90  . Kidney disease Brother     RENAL FAILURE  . Hypertension Brother   . Heart attack Maternal Aunt 65    CABG  . Stroke Maternal Uncle     CVA  . Diabetes Neg Hx   . Colon cancer Neg Hx   . Esophageal cancer Neg Hx   . Rectal cancer Neg Hx    . Stomach cancer Neg Hx    GYNECOLOGIC HISTORY:  No LMP for male patient.   SOCIAL HISTORY:  History   Social History  . Marital Status: Married    Spouse Name: N/A    Number of Children: 3  . Years of Education: N/A   Occupational History  . Truck Geophysicist/field seismologist    Social History Main Topics  . Smoking status: Never Smoker   . Smokeless tobacco: Never Used     Comment: never used tobacco  . Alcohol Use: 0.0 oz/week    0 Not specified per week     Comment:  rarely  . Drug Use: No  . Sexual Activity: Yes    Birth Control/ Protection: Condom   Other Topics Concern  . Not on file   Social History Narrative   Son in [place] in [place]   Daughter at [employer] state   Married   Truck driver   Enjoys exercise, basketball, watch sports, work around [employer] in [place]: <no information>  HEALTH MAINTENANCE: History  Substance Use Topics  . Smoking status: Never Smoker   . Smokeless tobacco: Never Used     Comment: never used tobacco  . Alcohol Use: 0.0 oz/week    0 Not specified per week  Comment:  rarely   Colonoscopy: PAP: Bone density: Lipid panel:  No Known Allergies  Current Outpatient Prescriptions  Medication Sig Dispense Refill  . amLODipine (NORVASC) 5 MG tablet TAKE 1 TABLET BY MOUTH DAILY 90 tablet 1  . aspirin 325 MG tablet Take 325 mg by mouth daily.    Marland Kitchen atorvastatin (LIPITOR) 20 MG tablet TAKE 1 TABLET BY MOUTH DAILY 90 tablet 1  . famciclovir (FAMVIR) 250 MG tablet Take 1 tablet (250 mg total) by mouth daily. 30 tablet 4  . Lactulose 20 GM/30ML SOLN Take 30 mLs (20 g total) by mouth every 8 (eight) hours. 960 mL 2  . lenalidomide (REVLIMID) 25 MG capsule Take 1 capsule (25 mg total) by mouth daily. Take for 21 days and then off for 7 days. Auth #[id number] 21 capsule 0  . psyllium (METAMUCIL) 58.6 % powder Take 1 packet by mouth daily.    . sildenafil (VIAGRA) 100 MG tablet Take 1 tablet (100 mg  total) by mouth daily as needed for erectile dysfunction. 10 tablet 2   No current facility-administered medications for this visit.   OBJECTIVE: Filed Vitals:   09/28/14 0908  BP: 144/87  Pulse: 87  Temp: 98.2 F (36.8 C)  Resp: 18   Body mass index is 27.12 kg/(m^2). ECOG FS:0 - Asymptomatic Ocular: Sclerae unicteric, pupils equal, round and reactive to light Ear-nose-throat: Oropharynx clear, dentition fair Lymphatic: No cervical or supraclavicular adenopathy Lungs no rales or rhonchi, good excursion bilaterally Heart regular rate and rhythm, no murmur appreciated Abd soft, nontender, positive bowel sounds MSK no focal spinal tenderness, no joint edema Neuro: non-focal, well-oriented, appropriate affect  LAB RESULTS: CMP     Component Value Date/Time   NA 137 09/28/2014 0847   NA 141 09/19/2014 1543   NA 137 02/02/2014 1557   K 3.6 09/28/2014 0847   K 3.5 09/19/2014 1543   K 3.8 02/02/2014 1557   CL 98 09/28/2014 0847   CL 103 02/02/2014 1557   CO2 28 09/28/2014 0847   CO2 23 09/19/2014 1543   CO2 27 02/02/2014 1557   GLUCOSE 91 09/28/2014 0847   GLUCOSE 98 09/19/2014 1543   GLUCOSE 92 02/02/2014 1557   BUN 9 09/28/2014 0847   BUN 7.6 09/19/2014 1543   BUN 12 02/02/2014 1557   CREATININE 1.1 09/28/2014 0847   CREATININE 1.2 09/19/2014 1543   CREATININE 1.22 02/02/2014 1557   CALCIUM 8.9 09/28/2014 0847   CALCIUM 8.9 09/19/2014 1543   CALCIUM 9.2 02/02/2014 1557   PROT 6.8 09/28/2014 0847   PROT 6.7 09/19/2014 1543   PROT 6.7 02/02/2014 1557   ALBUMIN 3.9 09/19/2014 1543   ALBUMIN 4.0 02/02/2014 1557   AST 20 09/28/2014 0847   AST 12 09/19/2014 1543   AST 16 02/02/2014 1557   ALT 26 09/28/2014 0847   ALT 16 09/19/2014 1543   ALT 15 02/02/2014 1557   ALKPHOS 33 09/28/2014 0847   ALKPHOS 51 09/19/2014 1543   ALKPHOS 47 02/02/2014 1557   BILITOT 1.00 09/28/2014 0847   BILITOT 0.49 09/19/2014 1543   BILITOT 0.4 02/02/2014 1557   GFRNONAA 82* 01/29/2014  0240   GFRAA >90 01/29/2014 0240   No results found for: SPEP Lab Results  Component Value Date   WBC 4.8 09/28/2014   NEUTROABS 2.2 09/28/2014   HGB 14.5 09/28/2014   HCT 40.8 09/28/2014   MCV 91 09/28/2014   PLT 173 09/28/2014   No results found for: LABCA2 No components found for:  [id number] No results for input(s): INR in the last 168 hours.  STUDIES: No results found.  ASSESSMENT/PLAN: Mr. Potenza is 53 year old gentleman with IgG kappa myeloma. When he was initially seen he did not show an obvious heavy chain although he did have decreased IgA and IgM levels. He now has detectable heavy chain.  He has an appointment with [facility] in two weeks for port a cath placement. He will have his transplant in January.  We will proceed with treatment today as planned.  His CBC and CMP today look good. We will wait and see what the rest of his labs show.  We will see him back next month for labs and follow-up. He knows to call here with any questions and concerns and to go to the ED in the event of an emergency. We can certainly see him sooner if need be.   Eliezer Bottom, NP 09/28/2014 10:50 AM

## 2014-09-28 NOTE — Assessment & Plan Note (Signed)
Complete follow up lipid panel, continue statin.

## 2014-09-28 NOTE — Assessment & Plan Note (Signed)
Undergoing chemo, management per Dr.  Marin Olp.

## 2014-09-28 NOTE — Progress Notes (Signed)
Subjective:    Patient ID: Jimmy Day, male    DOB: 23-Apr-1961, 53 y.o.   MRN: [id number]  HPI  Jimmy Day is a 54 yr old male who presents today in transfer from Dr. Linna Darner at our [place] office.  Pmhx is significant for the following:    Hyperlipidemia- maintained on lipitor.  Denies myalgia.  Lab Results  Component Value Date   CHOL 115 09/29/2013   HDL 35.00* 09/29/2013   LDLCALC 53 09/29/2013   LDLDIRECT 88.5 08/07/2013   TRIG 137.0 09/29/2013   CHOLHDL 3 09/29/2013    Hypogonadism- reports that he used to use testosterone gel. Has followed with Dr. Gaynelle Arabian.  HTN- BP meds include amlodipine 80m once daily.  Denies CP/SOB/swelling.  BP Readings from Last 3 Encounters:  09/28/14 130/86  09/19/14 120/66  08/29/14 156/95   Multiple Myeloma- [facility]  Was diagnosed in March.  Reports that he had an x ray at [facility] which showed some lesions. [facility] is currently taking chemo, [facility]. Works 60-70 hours a week.    Fasting hyperglycemia- last two sugars have been normal.    Review of Systems See HPI  Past Medical History  Diagnosis Date  . Fasting hyperglycemia     NORMAL A1c  . Hyperlipidemia   . Testosterone deficiency     Dr THartley Barefoot . Hypertension   . Kappa light chain myeloma 02/02/2014    History   Social History  . Marital Status: Married    Spouse Name: N/A    Number of Children: 3  . Years of Education: N/A   Occupational History  . Truck DGeophysicist/field seismologist   Social History Main Topics  . Smoking status: Never Smoker   . Smokeless tobacco: Never Used     Comment: never used tobacco  . Alcohol Use: 0.0 oz/week    0 Not specified per week     Comment:  rarely  . Drug Use: No  . Sexual Activity: Yes    Birth Control/ Protection: Condom   Other Topics Concern  . Not on file   Social History Narrative   [place]  [place]   Married   Truck driver   Enjoys exercise, basketball, watch sports, work around [employer] [employer]      Past Surgical History  Procedure Laterality Date  . Lipomas removal      BENIGN FROM CHEST AND BACK  . Cervical plate insertion      POST COMPRESSION DISC INJURY    Family History  Problem Relation Age of Onset  . Breast cancer Mother   . Heart attack Father 454 . Kidney disease Brother     RENAL FAILURE  . Hypertension Brother   . Heart attack Maternal Aunt 65    CABG  . Stroke Maternal Uncle     CVA  . Diabetes Neg Hx   . Colon cancer Neg Hx   . Esophageal cancer Neg Hx   . Rectal cancer Neg Hx   . Stomach cancer Neg Hx     No Known Allergies  Current Outpatient Prescriptions on File Prior to Visit  Medication Sig Dispense Refill  . amLODipine (NORVASC) 5 MG tablet TAKE 1 TABLET BY MOUTH DAILY 90 tablet 1  . aspirin 325 MG tablet Take 325 mg  by mouth daily.    Marland Kitchen atorvastatin (LIPITOR) 20 MG tablet TAKE 1 TABLET BY MOUTH DAILY 90 tablet 1  . famciclovir (FAMVIR) 250 MG tablet Take 1 tablet (250 mg total) by mouth daily. 30 tablet 4  . Lactulose 20 GM/30ML SOLN Take 30 mLs (20 g total) by mouth every 8 (eight) hours. 960 mL 2  . lenalidomide (REVLIMID) 25 MG capsule Take 1 capsule (25 mg total) by mouth daily. Take for 21 days and then off for 7 days. Auth #[id number] 21 capsule 0  . psyllium (METAMUCIL) 58.6 % powder Take 1 packet by mouth daily.    . sildenafil (VIAGRA) 100 MG tablet Take 1 tablet (100 mg total) by mouth daily as needed for erectile dysfunction. 10 tablet 2   No current facility-administered medications on file prior to visit.    BP 130/86 mmHg  Pulse 59  Temp(Src) 97.8 F (36.6 C) (Oral)  Resp 16  Ht 6' (1.829 m)  Wt 202 lb 3.2 oz (91.717 kg)  BMI 27.42 kg/m2  SpO2 99%       Objective:   Physical Exam  Constitutional: He is oriented to person, place, and time. He appears well-developed and well-nourished. No  distress.  HENT:  Head: Normocephalic and atraumatic.  Right Ear: Tympanic membrane and ear canal normal.  Left Ear: Tympanic membrane and ear canal normal.  Mouth/Throat: No posterior oropharyngeal edema or posterior oropharyngeal erythema.  Cardiovascular: Normal rate and regular rhythm.   No murmur heard. Pulmonary/Chest: Effort normal and breath sounds normal. No respiratory distress. He has no wheezes. He has no rales. He exhibits no tenderness.  Neurological: He is alert and oriented to person, place, and time.  Psychiatric: He has a normal mood and affect. His behavior is normal. Judgment and thought content normal.          Assessment & Plan:

## 2014-09-28 NOTE — Patient Instructions (Signed)
Please complete lab work prior to leaving. Speak with Dr. Marin Olp re: flu shot and if he recommends, let us know and we will arrange nurse visit. Follow up in 3 months.

## 2014-09-28 NOTE — Patient Instructions (Signed)
Bortezomib injection What is this medicine? BORTEZOMIB (bor TEZ oh mib) is a chemotherapy drug. It slows the growth of cancer cells. This medicine is used to treat multiple myeloma, and certain lymphomas, such as mantle-cell lymphoma. This medicine may be used for other purposes; ask your health care provider or pharmacist if you have questions. COMMON BRAND NAME(S): Velcade What should I tell my health care provider before I take this medicine? They need to know if you have any of these conditions: -diabetes -heart disease -irregular heartbeat -liver disease -on hemodialysis -low blood counts, like low white blood cells, platelets, or hemoglobin -peripheral neuropathy -taking medicine for blood pressure -an unusual or allergic reaction to bortezomib, mannitol, boron, other medicines, foods, dyes, or preservatives -pregnant or trying to get pregnant -breast-feeding How should I use this medicine? This medicine is for injection into a vein or for injection under the skin. It is given by a health care professional in a hospital or clinic setting. Talk to your pediatrician regarding the use of this medicine in children. Special care may be needed. Overdosage: If you think you have taken too much of this medicine contact a poison control center or emergency room at once. NOTE: This medicine is only for you. Do not share this medicine with others. What if I miss a dose? It is important not to miss your dose. Call your doctor or health care professional if you are unable to keep an appointment. What may interact with this medicine? This medicine may interact with the following medications: -ketoconazole -rifampin -ritonavir -St. John's Wort This list may not describe all possible interactions. Give your health care provider a list of all the medicines, herbs, non-prescription drugs, or dietary supplements you use. Also tell them if you smoke, drink alcohol, or use illegal drugs. Some items  may interact with your medicine. What should I watch for while using this medicine? Visit your doctor for checks on your progress. This drug may make you feel generally unwell. This is not uncommon, as chemotherapy can affect healthy cells as well as cancer cells. Report any side effects. Continue your course of treatment even though you feel ill unless your doctor tells you to stop. You may get drowsy or dizzy. Do not drive, use machinery, or do anything that needs mental alertness until you know how this medicine affects you. Do not stand or sit up quickly, especially if you are an older patient. This reduces the risk of dizzy or fainting spells. In some cases, you may be given additional medicines to help with side effects. Follow all directions for their use. Call your doctor or health care professional for advice if you get a fever, chills or sore throat, or other symptoms of a cold or flu. Do not treat yourself. This drug decreases your body's ability to fight infections. Try to avoid being around people who are sick. This medicine may increase your risk to bruise or bleed. Call your doctor or health care professional if you notice any unusual bleeding. You may need blood work done while you are taking this medicine. In some patients, this medicine may cause a serious brain infection that may cause death. If you have any problems seeing, thinking, speaking, walking, or standing, tell your doctor right away. If you cannot reach your doctor, urgently seek other source of medical care. Do not become pregnant while taking this medicine. Women should inform their doctor if they wish to become pregnant or think they might be pregnant. There is   a potential for serious side effects to an unborn child. Talk to your health care professional or pharmacist for more information. Do not breast-feed an infant while taking this medicine. Check with your doctor or health care professional if you get an attack of  severe diarrhea, nausea and vomiting, or if you sweat a lot. The loss of too much body fluid can make it dangerous for you to take this medicine. What side effects may I notice from receiving this medicine? Side effects that you should report to your doctor or health care professional as soon as possible: -allergic reactions like skin rash, itching or hives, swelling of the face, lips, or tongue -breathing problems -changes in hearing -changes in vision -fast, irregular heartbeat -feeling faint or lightheaded, falls -pain, tingling, numbness in the hands or feet -right upper belly pain -seizures -swelling of the ankles, feet, hands -unusual bleeding or bruising -unusually weak or tired -vomiting -yellowing of the eyes or skin Side effects that usually do not require medical attention (report to your doctor or health care professional if they continue or are bothersome): -changes in emotions or moods -constipation -diarrhea -loss of appetite -headache -irritation at site where injected -nausea This list may not describe all possible side effects. Call your doctor for medical advice about side effects. You may report side effects to FDA at 1-800-FDA-1088. Where should I keep my medicine? This drug is given in a hospital or clinic and will not be stored at home. NOTE: This sheet is a summary. It may not cover all possible information. If you have questions about this medicine, talk to your doctor, pharmacist, or health care provider.  2015, [employer]. (2013-09-04 12:46:32)  

## 2014-09-28 NOTE — Assessment & Plan Note (Signed)
Recent sugars have been stable.

## 2014-09-28 NOTE — Assessment & Plan Note (Signed)
Will obtain follow up testosterone level.  Has been on topical therapy per Dr. Gaynelle Arabian in the past.

## 2014-10-01 ENCOUNTER — Telehealth: Payer: Self-pay | Admitting: Family

## 2014-10-01 DIAGNOSIS — E559 Vitamin D deficiency, unspecified: Secondary | ICD-10-CM

## 2014-10-01 LAB — TESTOSTERONE, FREE, TOTAL, SHBG
Sex Hormone Binding: 28 nmol/L (ref 13–71)
TESTOSTERONE-% FREE: 2.3 % (ref 1.6–2.9)
TESTOSTERONE: 432 ng/dL (ref 300–890)
Testosterone, Free: 97.3 pg/mL (ref 47.0–244.0)

## 2014-10-01 MED ORDER — VITAMIN D (ERGOCALCIFEROL) 1.25 MG (50000 UNIT) PO CAPS: 50000.0000 [IU] | ORAL_CAPSULE | ORAL | Status: DC

## 2014-10-01 NOTE — Telephone Encounter (Signed)
Testosterone level is in the normal range.  Vit D level is very low. Start vit supplement once weekly for 12 weeks, then repeat vit d level. Dx vit D deficiency. Cholesterol looks good except good cholesterol could be better.  Exercise can help raise good cholesterol.

## 2014-10-02 NOTE — Telephone Encounter (Signed)
Left detailed message on pt's cell# and to call and schedule lab appt in 12 weeks. Future lab order entered.

## 2014-10-02 NOTE — Telephone Encounter (Signed)
Left message for pt to return my call.

## 2014-10-02 NOTE — Telephone Encounter (Signed)
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

## 2014-10-05 ENCOUNTER — Ambulatory Visit (HOSPITAL_BASED_OUTPATIENT_CLINIC_OR_DEPARTMENT_OTHER): Payer: BC Managed Care – PPO | Admitting: Lab

## 2014-10-05 ENCOUNTER — Ambulatory Visit (HOSPITAL_BASED_OUTPATIENT_CLINIC_OR_DEPARTMENT_OTHER): Payer: BC Managed Care – PPO

## 2014-10-05 DIAGNOSIS — Z5112 Encounter for antineoplastic immunotherapy: Secondary | ICD-10-CM

## 2014-10-05 DIAGNOSIS — C9000 Multiple myeloma not having achieved remission: Secondary | ICD-10-CM

## 2014-10-05 DIAGNOSIS — Z9481 Bone marrow transplant status: Secondary | ICD-10-CM | POA: Insufficient documentation

## 2014-10-05 LAB — CBC WITH DIFFERENTIAL (CANCER CENTER ONLY)
BASO#: 0.1 10*3/uL (ref 0.0–0.2)
BASO%: 1 % (ref 0.0–2.0)
EOS%: 2.1 % (ref 0.0–7.0)
Eosinophils Absolute: 0.1 10*3/uL (ref 0.0–0.5)
HEMATOCRIT: 41.1 % (ref 38.7–49.9)
HGB: 14.8 g/dL (ref 13.0–17.1)
LYMPH#: 2 10*3/uL (ref 0.9–3.3)
LYMPH%: 34.1 % (ref 14.0–48.0)
MCH: 32.2 pg (ref 28.0–33.4)
MCHC: 36 g/dL — AB (ref 32.0–35.9)
MCV: 90 fL (ref 82–98)
MONO#: 0.5 10*3/uL (ref 0.1–0.9)
MONO%: 8.4 % (ref 0.0–13.0)
NEUT#: 3.2 10*3/uL (ref 1.5–6.5)
NEUT%: 54.4 % (ref 40.0–80.0)
Platelets: 181 10*3/uL (ref 145–400)
RBC: 4.59 10*6/uL (ref 4.20–5.70)
RDW: 12.8 % (ref 11.1–15.7)
WBC: 5.8 10*3/uL (ref 4.0–10.0)

## 2014-10-05 LAB — CMP (CANCER CENTER ONLY)
ALT(SGPT): 25 U/L (ref 10–47)
AST: 20 U/L (ref 11–38)
Albumin: 4 g/dL (ref 3.3–5.5)
Alkaline Phosphatase: 36 U/L (ref 26–84)
BILIRUBIN TOTAL: 1.1 mg/dL (ref 0.20–1.60)
BUN, Bld: 9 mg/dL (ref 7–22)
CO2: 26 meq/L (ref 18–33)
CREATININE: 1.3 mg/dL — AB (ref 0.6–1.2)
Calcium: 8.8 mg/dL (ref 8.0–10.3)
Chloride: 103 mEq/L (ref 98–108)
Glucose, Bld: 123 mg/dL — ABNORMAL HIGH (ref 73–118)
Potassium: 3.3 mEq/L (ref 3.3–4.7)
SODIUM: 140 meq/L (ref 128–145)
Total Protein: 7.2 g/dL (ref 6.4–8.1)

## 2014-10-05 MED ORDER — ONDANSETRON HCL 8 MG PO TABS
ORAL_TABLET | ORAL | Status: AC
  Filled 2014-10-05: qty 1

## 2014-10-05 MED ORDER — BORTEZOMIB CHEMO SQ INJECTION 3.5 MG (2.5MG/ML)
1.3000 mg/m2 | Freq: Once | INTRAMUSCULAR | Status: AC
  Administered 2014-10-05: 2.75 mg via SUBCUTANEOUS
  Filled 2014-10-05: qty 2.75

## 2014-10-05 MED ORDER — ONDANSETRON HCL 8 MG PO TABS
8.0000 mg | ORAL_TABLET | Freq: Once | ORAL | Status: AC
  Administered 2014-10-05: 8 mg via ORAL

## 2014-10-05 NOTE — Patient Instructions (Signed)
Bortezomib injection What is this medicine? BORTEZOMIB (bor TEZ oh mib) is a chemotherapy drug. It slows the growth of cancer cells. This medicine is used to treat multiple myeloma, and certain lymphomas, such as mantle-cell lymphoma. This medicine may be used for other purposes; ask your health care provider or pharmacist if you have questions. COMMON BRAND NAME(S): Velcade What should I tell my health care provider before I take this medicine? They need to know if you have any of these conditions: -diabetes -heart disease -irregular heartbeat -liver disease -on hemodialysis -low blood counts, like low white blood cells, platelets, or hemoglobin -peripheral neuropathy -taking medicine for blood pressure -an unusual or allergic reaction to bortezomib, mannitol, boron, other medicines, foods, dyes, or preservatives -pregnant or trying to get pregnant -breast-feeding How should I use this medicine? This medicine is for injection into a vein or for injection under the skin. It is given by a health care professional in a hospital or clinic setting. Talk to your pediatrician regarding the use of this medicine in children. Special care may be needed. Overdosage: If you think you have taken too much of this medicine contact a poison control center or emergency room at once. NOTE: This medicine is only for you. Do not share this medicine with others. What if I miss a dose? It is important not to miss your dose. Call your doctor or health care professional if you are unable to keep an appointment. What may interact with this medicine? This medicine may interact with the following medications: -ketoconazole -rifampin -ritonavir -St. John's Wort This list may not describe all possible interactions. Give your health care provider a list of all the medicines, herbs, non-prescription drugs, or dietary supplements you use. Also tell them if you smoke, drink alcohol, or use illegal drugs. Some items  may interact with your medicine. What should I watch for while using this medicine? Visit your doctor for checks on your progress. This drug may make you feel generally unwell. This is not uncommon, as chemotherapy can affect healthy cells as well as cancer cells. Report any side effects. Continue your course of treatment even though you feel ill unless your doctor tells you to stop. You may get drowsy or dizzy. Do not drive, use machinery, or do anything that needs mental alertness until you know how this medicine affects you. Do not stand or sit up quickly, especially if you are an older patient. This reduces the risk of dizzy or fainting spells. In some cases, you may be given additional medicines to help with side effects. Follow all directions for their use. Call your doctor or health care professional for advice if you get a fever, chills or sore throat, or other symptoms of a cold or flu. Do not treat yourself. This drug decreases your body's ability to fight infections. Try to avoid being around people who are sick. This medicine may increase your risk to bruise or bleed. Call your doctor or health care professional if you notice any unusual bleeding. You may need blood work done while you are taking this medicine. In some patients, this medicine may cause a serious brain infection that may cause death. If you have any problems seeing, thinking, speaking, walking, or standing, tell your doctor right away. If you cannot reach your doctor, urgently seek other source of medical care. Do not become pregnant while taking this medicine. Women should inform their doctor if they wish to become pregnant or think they might be pregnant. There is   a potential for serious side effects to an unborn child. Talk to your health care professional or pharmacist for more information. Do not breast-feed an infant while taking this medicine. Check with your doctor or health care professional if you get an attack of  severe diarrhea, nausea and vomiting, or if you sweat a lot. The loss of too much body fluid can make it dangerous for you to take this medicine. What side effects may I notice from receiving this medicine? Side effects that you should report to your doctor or health care professional as soon as possible: -allergic reactions like skin rash, itching or hives, swelling of the face, lips, or tongue -breathing problems -changes in hearing -changes in vision -fast, irregular heartbeat -feeling faint or lightheaded, falls -pain, tingling, numbness in the hands or feet -right upper belly pain -seizures -swelling of the ankles, feet, hands -unusual bleeding or bruising -unusually weak or tired -vomiting -yellowing of the eyes or skin Side effects that usually do not require medical attention (report to your doctor or health care professional if they continue or are bothersome): -changes in emotions or moods -constipation -diarrhea -loss of appetite -headache -irritation at site where injected -nausea This list may not describe all possible side effects. Call your doctor for medical advice about side effects. You may report side effects to FDA at 1-800-FDA-1088. Where should I keep my medicine? This drug is given in a hospital or clinic and will not be stored at home. NOTE: This sheet is a summary. It may not cover all possible information. If you have questions about this medicine, talk to your doctor, pharmacist, or health care provider.  2015, [employer]. (2013-09-04 12:46:32)  

## 2014-10-09 ENCOUNTER — Other Ambulatory Visit: Payer: BC Managed Care – PPO | Admitting: Lab

## 2014-10-09 LAB — KAPPA/LAMBDA LIGHT CHAINS
KAPPA LAMBDA RATIO: 2.58 — AB (ref 0.26–1.65)
Kappa free light chain: 3.97 mg/dL — ABNORMAL HIGH (ref 0.33–1.94)
LAMBDA FREE LGHT CHN: 1.54 mg/dL (ref 0.57–2.63)

## 2014-10-09 LAB — IFE INTERPRETATION

## 2014-10-09 LAB — PROTEIN ELECTROPHORESIS, SERUM, WITH REFLEX
Albumin ELP: 61 % (ref 55.8–66.1)
Alpha-1-Globulin: 3.8 % (ref 2.9–4.9)
Alpha-2-Globulin: 7.7 % (ref 7.1–11.8)
Beta 2: 3.5 % (ref 3.2–6.5)
Beta Globulin: 5 % (ref 4.7–7.2)
Gamma Globulin: 19 % — ABNORMAL HIGH (ref 11.1–18.8)
M-Spike, %: 0.28 g/dL
TOTAL PROTEIN, SERUM ELECTROPHOR: 7.4 g/dL (ref 6.0–8.3)

## 2014-10-09 LAB — IGG, IGA, IGM
IGA: 23 mg/dL — AB (ref 68–379)
IgG (Immunoglobin G), Serum: 1120 mg/dL (ref 650–1600)
IgM, Serum: 30 mg/dL — ABNORMAL LOW (ref 41–251)

## 2014-10-11 ENCOUNTER — Encounter: Payer: Self-pay | Admitting: Family

## 2014-10-11 ENCOUNTER — Other Ambulatory Visit: Payer: Self-pay | Admitting: *Deleted

## 2014-10-11 DIAGNOSIS — C9000 Multiple myeloma not having achieved remission: Secondary | ICD-10-CM

## 2014-10-11 LAB — UIFE/LIGHT CHAINS/TP QN, 24-HR UR
ALPHA 1 UR: DETECTED — AB
ALPHA 2 UR: DETECTED — AB
Albumin, U: DETECTED
BETA UR: DETECTED — AB
GAMMA UR: DETECTED — AB
TOTAL PROTEIN, URINE-UPE24: 11 mg/dL (ref 5–25)
Time: 24 hours
Total Protein, Urine-Ur/day: 99 mg/d (ref <150)
Volume, Urine: 900 mL

## 2014-10-12 LAB — KAPPA/LAMBDA LIGHT CHAINS, FREE, WITH RATIO, 24HR. URINE
KAPPA LIGHT CHAIN, FREE U: 35.6 mg/L — AB (ref 1.35–24.19)
Kappa/Lambda, Free Ratio: 21.98 — ABNORMAL HIGH (ref 2.04–10.37)
Lambda Light Chain, Free U: 1.62 mg/L (ref 0.24–6.66)

## 2014-10-19 ENCOUNTER — Emergency Department (INDEPENDENT_AMBULATORY_CARE_PROVIDER_SITE_OTHER)
Admission: EM | Admit: 2014-10-19 | Discharge: 2014-10-19 | Disposition: A | Discharge status: Home / Self Care | Payer: BC Managed Care – PPO | Source: Home / Self Care | Attending: Emergency Medicine | Admitting: Emergency Medicine

## 2014-10-19 ENCOUNTER — Encounter (HOSPITAL_COMMUNITY): Payer: Self-pay | Admitting: Emergency Medicine

## 2014-10-19 DIAGNOSIS — T148 Other injury of unspecified body region: Secondary | ICD-10-CM

## 2014-10-19 DIAGNOSIS — T148XXA Other injury of unspecified body region, initial encounter: Secondary | ICD-10-CM

## 2014-10-19 IMAGING — CT NM PET TUM IMG INITIAL (PI) SKULL BASE T - THIGH
1 of 7 series · 1 of 25 positions shown · non-contrast
Comparison: MR FON SPINE WO/W CM dated 01/29/2014; CT ANGIO CHEST AORTA
W/CM & WO/CM dated 01/28/2014

CLINICAL DATA: Initial treatment strategy for myeloma.

EXAM:
NUCLEAR MEDICINE PET SKULL BASE TO THIGH
TECHNIQUE: 10.1 mCi F-18 FDG was injected intravenously. Full-ring PET imaging
was performed from the skull base to thigh after the radiotracer. CT
data was obtained and used for attenuation correction and anatomic
localization.
FASTING BLOOD GLUCOSE:  Value: Eighty-nine mg/dl

[Series 4: ct sk_thigh 5.0 b31f · axial · 5.0mm · 0.95mm/px · 1 of 219 slices shown]
[im 219/219  brain]
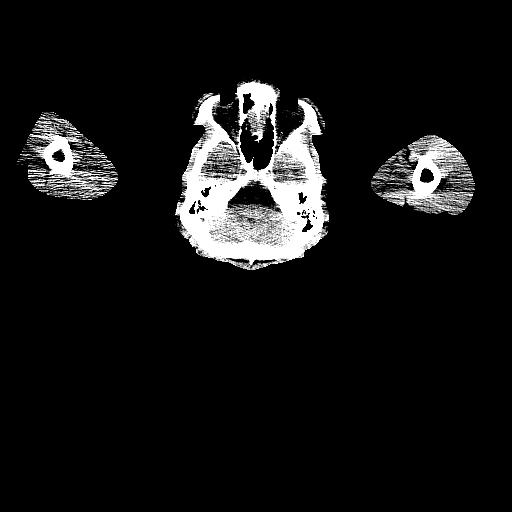

[1 of 25 positions shown; findings below may reference images not displayed]

FINDINGS: NECK

No hypermetabolic lymph nodes in the neck. CT images show small
retention cysts or polyps in the maxillary sinuses.

CHEST

No hypermetabolic mediastinal or hilar lymph nodes. No
hypermetabolic pulmonary nodules.

CT images show heart size at the upper limits of normal. No
pericardial effusion. Given respiratory motion, lungs are grossly
clear. No pleural fluid.

ABDOMEN/PELVIS

No abnormal hypermetabolism in the liver, adrenal glands, spleen or
pancreas. No hypermetabolic lymph nodes.

CT images show of the liver, gallbladder, adrenal glands, kidneys,
spleen and pancreas to be grossly unremarkable. There may be a small
diverticulum off the anteromedial margin of the stomach (series 4,
image 90). Stomach and bowel are otherwise grossly unremarkable. No
free fluid.

SKELETON

Numerous tiny lytic lesions are seen throughout the visualized
osseous structures without abnormal hypermetabolism. Lesions are
subcentimeter in size and therefore may be too small for PET
resolution.
IMPRESSION: Numerous tiny lytic lesions throughout the visualized osseous
structures, without associated abnormal hypermetabolism. Lesions may
be too small for PET resolution. CT morphology is in keeping with
the given suspicion of myeloma.

## 2014-10-19 MED ORDER — MELOXICAM 15 MG PO TABS: 15.0000 mg | ORAL_TABLET | Freq: Every day | ORAL | Status: DC

## 2014-10-19 MED ORDER — HYDROCODONE-ACETAMINOPHEN 5-325 MG PO TABS: ORAL_TABLET | ORAL | Status: DC

## 2014-10-19 NOTE — ED Notes (Signed)
Patient c/o left arm injury onset today. Patient reports he was moving furniture and felt a pop in his forearm. Patient is in NAD.

## 2014-10-19 NOTE — Discharge Instructions (Signed)

## 2014-10-19 NOTE — ED Provider Notes (Signed)
Chief Complaint    No chief complaint on file.   History of Present Illness     Jimmy Day is a 53 year old male who in hour ago was attempting to lift a heavy dresser, when he noticed a sensation of a pop in his left antecubital fossa and felt pain in that area. Thereafter unable to fully flex or fully extend at the elbow. There is no swelling or bruising. He denies any pain in the shoulder or the wrist. There is no numbness or tingling in the arm and no muscle weakness. No color change of the hand.  Review of Systems     Other than as noted above, the patient denies any of the following symptoms: Systemic:  No fevers or chills.   Musculoskeletal:  No arthritis, back pain, or neck pain. Neurological:  No muscular weakness or paresthesias.  [place]    Past medical history, family history, social history, meds, and allergies were reviewed.  He has multiple biloma and is on a number of medications including Revelimid, amlodipine, aspirin, Lipitor, Famvir, lactulose, Metamucil, Viagra, and vitamin D2.  Physical Exam    Vital signs:  BP 132/77 mmHg  Pulse 77  Temp(Src) 98.9 F (37.2 C) (Oral)  Resp 16  SpO2 100% Gen:  Alert and oriented times 3.  In no distress. Musculoskeletal: There is no swelling, bruising, or deformity. His pain to palpation in the antecubital fossa. The biceps tendon is intact to the hook test. He is able to forcibly flex at the elbow and forcibly extend, although it hurts. There is no pain to palpation the shoulder over the belly of the biceps. No deformity of the belly of the biceps or triceps. No pain to palpation over the olecranon or over the medial or lateral epicondyles. There is no pain to palpation in the mid to distal forearm, or the wrist.  Otherwise, all joints had a full a ROM with no swelling, bruising or deformity.  No edema, pulses full. Extremities were warm and pink.  Capillary refill was brisk.  Skin:  Clear, warm and dry.  No rash. Neuro:  Alert  and oriented times 3.  Muscle strength was normal.  Sensation was intact to light touch.   Course in Urgent [facility]   He was placed in a sling.  Assessment    The encounter diagnosis was Muscle tear.  There is no evidence of a biceps muscle tear or biceps tendon tear area and this all seems to be intact. He may have torn one of the lesser muscles in the forearm. For right now sling immobilization, rest, and ice would be all that he would need with follow-up next week with a hand surgeon.  Plan   1.  Meds:  The following meds were prescribed:   Discharge Medication List as of 10/19/2014  6:31 PM    START taking these medications   Details  HYDROcodone-acetaminophen (NORCO/VICODIN) 5-325 MG per tablet 1 to 2 tabs every 4 to 6 hours as needed for pain., Print    meloxicam (MOBIC) 15 MG tablet Take 1 tablet (15 mg total) by mouth daily., Starting 10/19/2014, Until Discontinued, Normal        2.  Patient Education/Counseling:  The patient was given appropriate handouts, self care instructions, and instructed in pain control, rest and activity, elevation, application of ice and compression.    3.  Follow up:  The patient was told to follow up here if no better in 3 to 4 days,  or sooner if becoming worse in any way, and given some red flag symptoms such as worsening pain or new neurological symptoms which would prompt immediate return. See Dr. Fredna Dow next week.    Harden Mo, MD 10/19/14 [phone]

## 2014-10-25 ENCOUNTER — Other Ambulatory Visit: Payer: Self-pay | Admitting: *Deleted

## 2014-10-25 DIAGNOSIS — C9000 Multiple myeloma not having achieved remission: Secondary | ICD-10-CM

## 2014-10-26 ENCOUNTER — Ambulatory Visit (HOSPITAL_BASED_OUTPATIENT_CLINIC_OR_DEPARTMENT_OTHER): Payer: BC Managed Care – PPO

## 2014-10-26 ENCOUNTER — Encounter: Payer: Self-pay | Admitting: Hematology & Oncology

## 2014-10-26 ENCOUNTER — Other Ambulatory Visit (HOSPITAL_BASED_OUTPATIENT_CLINIC_OR_DEPARTMENT_OTHER): Payer: BC Managed Care – PPO | Admitting: Lab

## 2014-10-26 ENCOUNTER — Ambulatory Visit (HOSPITAL_BASED_OUTPATIENT_CLINIC_OR_DEPARTMENT_OTHER): Payer: BC Managed Care – PPO | Admitting: Hematology & Oncology

## 2014-10-26 VITALS — BP 124/88 | HR 73 | Temp 98.1°F | Resp 18 | Ht 71.0 in | Wt 202.0 lb

## 2014-10-26 DIAGNOSIS — C9000 Multiple myeloma not having achieved remission: Secondary | ICD-10-CM

## 2014-10-26 DIAGNOSIS — Z5112 Encounter for antineoplastic immunotherapy: Secondary | ICD-10-CM

## 2014-10-26 LAB — CBC WITH DIFFERENTIAL (CANCER CENTER ONLY)
BASO#: 0 10*3/uL (ref 0.0–0.2)
BASO%: 0.3 % (ref 0.0–2.0)
EOS%: 4.4 % (ref 0.0–7.0)
Eosinophils Absolute: 0.1 10*3/uL (ref 0.0–0.5)
HCT: 37.9 % — ABNORMAL LOW (ref 38.7–49.9)
HGB: 13.7 g/dL (ref 13.0–17.1)
LYMPH#: 0.6 10*3/uL — AB (ref 0.9–3.3)
LYMPH%: 19.7 % (ref 14.0–48.0)
MCH: 32.5 pg (ref 28.0–33.4)
MCHC: 36.1 g/dL — ABNORMAL HIGH (ref 32.0–35.9)
MCV: 90 fL (ref 82–98)
MONO#: 0.4 10*3/uL (ref 0.1–0.9)
MONO%: 11.6 % (ref 0.0–13.0)
NEUT#: 2.1 10*3/uL (ref 1.5–6.5)
NEUT%: 64 % (ref 40.0–80.0)
PLATELETS: 189 10*3/uL (ref 145–400)
RBC: 4.22 10*6/uL (ref 4.20–5.70)
RDW: 12.9 % (ref 11.1–15.7)
WBC: 3.2 10*3/uL — AB (ref 4.0–10.0)

## 2014-10-26 LAB — CMP (CANCER CENTER ONLY)
ALT: 44 U/L (ref 10–47)
AST: 26 U/L (ref 11–38)
Albumin: 3.7 g/dL (ref 3.3–5.5)
Alkaline Phosphatase: 56 U/L (ref 26–84)
BILIRUBIN TOTAL: 0.8 mg/dL (ref 0.20–1.60)
BUN, Bld: 9 mg/dL (ref 7–22)
CALCIUM: 8.4 mg/dL (ref 8.0–10.3)
CHLORIDE: 104 meq/L (ref 98–108)
CO2: 27 mEq/L (ref 18–33)
Creat: 1 mg/dl (ref 0.6–1.2)
Glucose, Bld: 97 mg/dL (ref 73–118)
Potassium: 3.6 mEq/L (ref 3.3–4.7)
SODIUM: 137 meq/L (ref 128–145)
TOTAL PROTEIN: 6.9 g/dL (ref 6.4–8.1)

## 2014-10-26 LAB — VITAMIN B12: VITAMIN B 12: 1803 pg/mL — AB (ref 211–911)

## 2014-10-26 MED ORDER — ONDANSETRON HCL 8 MG PO TABS
ORAL_TABLET | ORAL | Status: AC
  Filled 2014-10-26: qty 1

## 2014-10-26 MED ORDER — BORTEZOMIB CHEMO SQ INJECTION 3.5 MG (2.5MG/ML)
1.3000 mg/m2 | Freq: Once | INTRAMUSCULAR | Status: AC
  Administered 2014-10-26: 2.75 mg via SUBCUTANEOUS
  Filled 2014-10-26: qty 1.1

## 2014-10-26 MED ORDER — ZOLEDRONIC ACID 4 MG/100ML IV SOLN
4.0000 mg | Freq: Once | INTRAVENOUS | Status: AC
  Administered 2014-10-26: 4 mg via INTRAVENOUS
  Filled 2014-10-26: qty 100

## 2014-10-26 MED ORDER — ONDANSETRON HCL 8 MG PO TABS
8.0000 mg | ORAL_TABLET | Freq: Once | ORAL | Status: AC
  Administered 2014-10-26: 8 mg via ORAL

## 2014-10-26 NOTE — Patient Instructions (Signed)
[facility] Discharge Instructions for Patients Receiving Chemotherapy  Today you received the following chemotherapy agents Velcade  To help prevent nausea and vomiting after your treatment, we encourage you to take your nausea medication    If you develop nausea and vomiting that is not controlled by your nausea medication, call the clinic.   BELOW ARE SYMPTOMS THAT SHOULD BE REPORTED IMMEDIATELY:  *FEVER GREATER THAN 100.5 F  *CHILLS WITH OR WITHOUT FEVER  NAUSEA AND VOMITING THAT IS NOT CONTROLLED WITH YOUR NAUSEA MEDICATION  *UNUSUAL SHORTNESS OF BREATH  *UNUSUAL BRUISING OR BLEEDING  TENDERNESS IN MOUTH AND THROAT WITH OR WITHOUT PRESENCE OF ULCERS  *URINARY PROBLEMS  *BOWEL PROBLEMS  UNUSUAL RASH Items with * indicate a potential emergency and should be followed up as soon as possible.  Feel free to call the clinic you have any questions or concerns. The clinic phone number is [phone].   Zoledronic Acid injection (Hypercalcemia, Oncology) What is this medicine? ZOLEDRONIC ACID (ZOE le dron ik AS id) lowers the amount of calcium loss from bone. It is used to treat too much calcium in your blood from cancer. It is also used to prevent complications of cancer that has spread to the bone. This medicine may be used for other purposes; ask your health care provider or pharmacist if you have questions. COMMON BRAND NAME(S): Zometa What should I tell my health care provider before I take this medicine? They need to know if you have any of these conditions: -aspirin-sensitive asthma -cancer, especially if you are receiving medicines used to treat cancer -dental disease or wear dentures -infection -kidney disease -receiving corticosteroids like dexamethasone or prednisone -an unusual or allergic reaction to zoledronic acid, other medicines, foods, dyes, or preservatives -pregnant or trying to get pregnant -breast-feeding How should I use this  medicine? This medicine is for infusion into a vein. It is given by a health care professional in a hospital or clinic setting. Talk to your pediatrician regarding the use of this medicine in children. Special care may be needed. Overdosage: If you think you have taken too much of this medicine contact a poison control center or emergency room at once. NOTE: This medicine is only for you. Do not share this medicine with others. What if I miss a dose? It is important not to miss your dose. Call your doctor or health care professional if you are unable to keep an appointment. What may interact with this medicine? -certain antibiotics given by injection -NSAIDs, medicines for pain and inflammation, like ibuprofen or naproxen -some diuretics like bumetanide, furosemide -teriparatide -thalidomide This list may not describe all possible interactions. Give your health care provider a list of all the medicines, herbs, non-prescription drugs, or dietary supplements you use. Also tell them if you smoke, drink alcohol, or use illegal drugs. Some items may interact with your medicine. What should I watch for while using this medicine? Visit your doctor or health care professional for regular checkups. It may be some time before you see the benefit from this medicine. Do not stop taking your medicine unless your doctor tells you to. Your doctor may order blood tests or other tests to see how you are doing. Women should inform their doctor if they wish to become pregnant or think they might be pregnant. There is a potential for serious side effects to an unborn child. Talk to your health care professional or pharmacist for more information. You should make sure that you get enough  calcium and vitamin D while you are taking this medicine. Discuss the foods you eat and the vitamins you take with your health care professional. Some people who take this medicine have severe bone, joint, and/or muscle pain. This  medicine may also increase your risk for jaw problems or a broken thigh bone. Tell your doctor right away if you have severe pain in your jaw, bones, joints, or muscles. Tell your doctor if you have any pain that does not go away or that gets worse. Tell your dentist and dental surgeon that you are taking this medicine. You should not have major dental surgery while on this medicine. See your dentist to have a dental exam and fix any dental problems before starting this medicine. Take good care of your teeth while on this medicine. Make sure you see your dentist for regular follow-up appointments. What side effects may I notice from receiving this medicine? Side effects that you should report to your doctor or health care professional as soon as possible: -allergic reactions like skin rash, itching or hives, swelling of the face, lips, or tongue -anxiety, confusion, or depression -breathing problems -changes in vision -eye pain -feeling faint or lightheaded, falls -jaw pain, especially after dental work -mouth sores -muscle cramps, stiffness, or weakness -trouble passing urine or change in the amount of urine Side effects that usually do not require medical attention (report to your doctor or health care professional if they continue or are bothersome): -bone, joint, or muscle pain -constipation -diarrhea -fever -hair loss -irritation at site where injected -loss of appetite -nausea, vomiting -stomach upset -trouble sleeping -trouble swallowing -weak or tired This list may not describe all possible side effects. Call your doctor for medical advice about side effects. You may report side effects to FDA at 1-800-FDA-1088. Where should I keep my medicine? This drug is given in a hospital or clinic and will not be stored at home. NOTE: This sheet is a summary. It may not cover all possible information. If you have questions about this medicine, talk to your doctor, pharmacist, or health  care provider.  2015, Elsevier/Gold Standard. (2013-04-20 13:03:13)

## 2014-10-26 NOTE — Progress Notes (Signed)
Hematology and Oncology Follow Up Visit  Jimmy Day [id number] [birth year] 53 y.o. 10/26/2014   Principle Diagnosis:  IgG Kappa myeloma  Current Therapy:    Status post 8 cycles of Velcade/Revlimid/Decadron  Zometa 4 mg IV every month     Interim History:  Jimmy Day is in for followup. He already has had a stem cells collected.  He still working. He feels well. He's had no problems with nausea or vomiting. He's had no cough. There's been no chest pain. He's had no bony pain. His last myeloma studies done back in November showed a monoclonal spike of 0.28 g/L. His IgG level was 1120 mg/dL. His kappa light chain was 4 mg/dL.  24-hour urine also done in November showed 99 mg of protein. I'm not sure how much light chain that he had. From the urine collection,, he had 35.6 mg/dL of kappa light chain.  His appetite has been good. There's been no leg swelling. He's had no change in bowel or bladder habits. His overall performance status is ECOG 0  Medications: Current outpatient prescriptions: amLODipine (NORVASC) 5 MG tablet, TAKE 1 TABLET BY MOUTH DAILY, Disp: 90 tablet, Rfl: 1;  aspirin 325 MG tablet, Take 325 mg by mouth daily., Disp: , Rfl: ;  atorvastatin (LIPITOR) 20 MG tablet, TAKE 1 TABLET BY MOUTH DAILY, Disp: 90 tablet, Rfl: 1;  famciclovir (FAMVIR) 250 MG tablet, Take 1 tablet (250 mg total) by mouth daily., Disp: 30 tablet, Rfl: 4 HYDROcodone-acetaminophen (NORCO/VICODIN) 5-325 MG per tablet, 1 to 2 tabs every 4 to 6 hours as needed for pain., Disp: 20 tablet, Rfl: 0;  Lactulose 20 GM/30ML SOLN, Take 30 mLs (20 g total) by mouth every 8 (eight) hours., Disp: 960 mL, Rfl: 2;  lenalidomide (REVLIMID) 25 MG capsule, Take 1 capsule (25 mg total) by mouth daily. Take for 21 days and then off for 7 days. [name] #[id number], Disp: 21 capsule, Rfl: 0 meloxicam (MOBIC) 15 MG tablet, Take 1 tablet (15 mg total) by mouth daily., Disp: 15 tablet, Rfl: 0;  psyllium (METAMUCIL) 58.6 % powder, Take 1  packet by mouth daily., Disp: , Rfl: ;  sildenafil (VIAGRA) 100 MG tablet, Take 1 tablet (100 mg total) by mouth daily as needed for erectile dysfunction., Disp: 10 tablet, Rfl: 2 Vitamin D, Ergocalciferol, (DRISDOL) 50000 UNITS CAPS capsule, Take 1 capsule (50,000 Units total) by mouth every 7 (seven) days., Disp: 12 capsule, Rfl: 0 No current facility-administered medications for this visit. Facility-Administered Medications Ordered in Other Visits: bortezomib SQ (VELCADE) chemo injection 2.75 mg, 1.3 mg/m2 (Treatment Plan Actual), Subcutaneous, Once, [name], MD;  ondansetron [facility]) tablet 8 mg, 8 mg, Oral, Once, [name], MD;  Zoledronic Acid (ZOMETA) 4 mg IVPB, 4 mg, Intravenous, Once, [name], MD  Allergies: No Known Allergies  Past Medical History, Surgical history, Social history, and Family History were reviewed and updated.  Review of Systems: As above  Physical Exam:  height is 5\' 11"  (1.803 m) and weight is 202 lb (91.627 kg). His oral temperature is 98.1 F (36.7 C). His blood pressure is 124/88 and his pulse is 73. His respiration is 18.   Well-developed and well-nourished African American gentleman. Head and neck exam shows no ocular or oral lesions. He has no palpable cervical or supraclavicular fizzed. Lungs are clear. Cardiac exam regular rate and rhythm with no murmurs rubs or bruits. Abdomen is soft. Has good bowel sounds. There is no fluid wave. There is no  palpable liver or spleen tip. Back exam shows no tenderness over the spine ribs or hips. Extremities shows no clubbing cyanosis or edema. Neurological exam is nonfocal.  Lab Results  Component Value Date   WBC 5.8 10/05/2014   HGB 14.8 10/05/2014   HCT 41.1 10/05/2014   MCV 90 10/05/2014   PLT 181 10/05/2014     Chemistry      Component Value Date/Time   NA 140 10/05/2014 0842   NA 141 09/19/2014 1543   NA 137 02/02/2014 1557   K 3.3 10/05/2014 0842   K 3.5 09/19/2014 1543   K 3.8  02/02/2014 1557   CL 103 10/05/2014 0842   CL 103 02/02/2014 1557   CO2 26 10/05/2014 0842   CO2 23 09/19/2014 1543   CO2 27 02/02/2014 1557   BUN 9 10/05/2014 0842   BUN 7.6 09/19/2014 1543   BUN 12 02/02/2014 1557   CREATININE 1.3* 10/05/2014 0842   CREATININE 1.2 09/19/2014 1543   CREATININE 1.22 02/02/2014 1557      Component Value Date/Time   CALCIUM 8.8 10/05/2014 0842   CALCIUM 8.9 09/19/2014 1543   CALCIUM 9.2 02/02/2014 1557   ALKPHOS 36 10/05/2014 0842   ALKPHOS 51 09/19/2014 1543   ALKPHOS 47 02/02/2014 1557   AST 20 10/05/2014 0842   AST 12 09/19/2014 1543   AST 16 02/02/2014 1557   ALT 25 10/05/2014 0842   ALT 16 09/19/2014 1543   ALT 15 02/02/2014 1557   BILITOT 1.10 10/05/2014 0842   BILITOT 0.49 09/19/2014 1543   BILITOT 0.4 02/02/2014 1557         Impression and Plan: Jimmy Day is 53 year old gentleman with IgG kappa myeloma. He is doing well with treatment. He is ready for transplant. I think that transplant we'll get him into a very good partial remission.  We will also give him his Zometa today.  He'll come back for the next 2 weeks for his Velcade.  I probably will then plan to see him back sometime in February, or whenever he is released from [place].  I spent about 35 minutes with him today.   [name], MD 12/4/20159:17 AM

## 2014-11-01 ENCOUNTER — Other Ambulatory Visit: Payer: Self-pay | Admitting: *Deleted

## 2014-11-01 DIAGNOSIS — C9000 Multiple myeloma not having achieved remission: Secondary | ICD-10-CM

## 2014-11-01 DIAGNOSIS — N521 Erectile dysfunction due to diseases classified elsewhere: Secondary | ICD-10-CM

## 2014-11-01 DIAGNOSIS — K5901 Slow transit constipation: Secondary | ICD-10-CM

## 2014-11-01 MED ORDER — LENALIDOMIDE 25 MG PO CAPS: 25.0000 mg | ORAL_CAPSULE | Freq: Every day | ORAL | Status: DC

## 2014-11-02 ENCOUNTER — Ambulatory Visit (HOSPITAL_BASED_OUTPATIENT_CLINIC_OR_DEPARTMENT_OTHER): Payer: BC Managed Care – PPO

## 2014-11-02 ENCOUNTER — Ambulatory Visit (HOSPITAL_BASED_OUTPATIENT_CLINIC_OR_DEPARTMENT_OTHER): Payer: BC Managed Care – PPO | Admitting: Lab

## 2014-11-02 DIAGNOSIS — C9000 Multiple myeloma not having achieved remission: Secondary | ICD-10-CM

## 2014-11-02 DIAGNOSIS — Z5112 Encounter for antineoplastic immunotherapy: Secondary | ICD-10-CM

## 2014-11-02 LAB — CMP (CANCER CENTER ONLY)
ALBUMIN: 3.4 g/dL (ref 3.3–5.5)
ALT(SGPT): 22 U/L (ref 10–47)
AST: 21 U/L (ref 11–38)
Alkaline Phosphatase: 42 U/L (ref 26–84)
BUN: 9 mg/dL (ref 7–22)
CALCIUM: 8.5 mg/dL (ref 8.0–10.3)
CHLORIDE: 104 meq/L (ref 98–108)
CO2: 26 mEq/L (ref 18–33)
Creat: 0.9 mg/dl (ref 0.6–1.2)
GLUCOSE: 119 mg/dL — AB (ref 73–118)
POTASSIUM: 3.8 meq/L (ref 3.3–4.7)
Sodium: 136 mEq/L (ref 128–145)
TOTAL PROTEIN: 6.7 g/dL (ref 6.4–8.1)
Total Bilirubin: 1.1 mg/dl (ref 0.20–1.60)

## 2014-11-02 LAB — CBC WITH DIFFERENTIAL (CANCER CENTER ONLY)
BASO#: 0 10*3/uL (ref 0.0–0.2)
BASO%: 0.4 % (ref 0.0–2.0)
EOS%: 3.3 % (ref 0.0–7.0)
Eosinophils Absolute: 0.2 10*3/uL (ref 0.0–0.5)
HCT: 37.2 % — ABNORMAL LOW (ref 38.7–49.9)
HGB: 13.1 g/dL (ref 13.0–17.1)
LYMPH#: 0.8 10*3/uL — ABNORMAL LOW (ref 0.9–3.3)
LYMPH%: 15.4 % (ref 14.0–48.0)
MCH: 32.4 pg (ref 28.0–33.4)
MCHC: 35.2 g/dL (ref 32.0–35.9)
MCV: 92 fL (ref 82–98)
MONO#: 0.6 10*3/uL (ref 0.1–0.9)
MONO%: 12.7 % (ref 0.0–13.0)
NEUT#: 3.3 10*3/uL (ref 1.5–6.5)
NEUT%: 68.2 % (ref 40.0–80.0)
PLATELETS: 225 10*3/uL (ref 145–400)
RBC: 4.04 10*6/uL — ABNORMAL LOW (ref 4.20–5.70)
RDW: 13.4 % (ref 11.1–15.7)
WBC: 4.9 10*3/uL (ref 4.0–10.0)

## 2014-11-02 MED ORDER — ONDANSETRON HCL 8 MG PO TABS
8.0000 mg | ORAL_TABLET | Freq: Once | ORAL | Status: AC
  Administered 2014-11-02: 8 mg via ORAL

## 2014-11-02 MED ORDER — BORTEZOMIB CHEMO SQ INJECTION 3.5 MG (2.5MG/ML)
1.3000 mg/m2 | Freq: Once | INTRAMUSCULAR | Status: AC
  Administered 2014-11-02: 2.75 mg via SUBCUTANEOUS
  Filled 2014-11-02: qty 2.75

## 2014-11-02 MED ORDER — ONDANSETRON HCL 8 MG PO TABS
ORAL_TABLET | ORAL | Status: AC
  Filled 2014-11-02: qty 1

## 2014-11-02 NOTE — Patient Instructions (Signed)
Bortezomib injection What is this medicine? BORTEZOMIB (bor TEZ oh mib) is a chemotherapy drug. It slows the growth of cancer cells. This medicine is used to treat multiple myeloma, and certain lymphomas, such as mantle-cell lymphoma. This medicine may be used for other purposes; ask your health care provider or pharmacist if you have questions. COMMON BRAND NAME(S): Velcade What should I tell my health care provider before I take this medicine? They need to know if you have any of these conditions: -diabetes -heart disease -irregular heartbeat -liver disease -on hemodialysis -low blood counts, like low white blood cells, platelets, or hemoglobin -peripheral neuropathy -taking medicine for blood pressure -an unusual or allergic reaction to bortezomib, mannitol, boron, other medicines, foods, dyes, or preservatives -pregnant or trying to get pregnant -breast-feeding How should I use this medicine? This medicine is for injection into a vein or for injection under the skin. It is given by a health care professional in a hospital or clinic setting. Talk to your pediatrician regarding the use of this medicine in children. Special care may be needed. Overdosage: If you think you have taken too much of this medicine contact a poison control center or emergency room at once. NOTE: This medicine is only for you. Do not share this medicine with others. What if I miss a dose? It is important not to miss your dose. Call your doctor or health care professional if you are unable to keep an appointment. What may interact with this medicine? This medicine may interact with the following medications: -ketoconazole -rifampin -ritonavir -St. John's Wort This list may not describe all possible interactions. Give your health care provider a list of all the medicines, herbs, non-prescription drugs, or dietary supplements you use. Also tell them if you smoke, drink alcohol, or use illegal drugs. Some items  may interact with your medicine. What should I watch for while using this medicine? Visit your doctor for checks on your progress. This drug may make you feel generally unwell. This is not uncommon, as chemotherapy can affect healthy cells as well as cancer cells. Report any side effects. Continue your course of treatment even though you feel ill unless your doctor tells you to stop. You may get drowsy or dizzy. Do not drive, use machinery, or do anything that needs mental alertness until you know how this medicine affects you. Do not stand or sit up quickly, especially if you are an older patient. This reduces the risk of dizzy or fainting spells. In some cases, you may be given additional medicines to help with side effects. Follow all directions for their use. Call your doctor or health care professional for advice if you get a fever, chills or sore throat, or other symptoms of a cold or flu. Do not treat yourself. This drug decreases your body's ability to fight infections. Try to avoid being around people who are sick. This medicine may increase your risk to bruise or bleed. Call your doctor or health care professional if you notice any unusual bleeding. You may need blood work done while you are taking this medicine. In some patients, this medicine may cause a serious brain infection that may cause death. If you have any problems seeing, thinking, speaking, walking, or standing, tell your doctor right away. If you cannot reach your doctor, urgently seek other source of medical care. Do not become pregnant while taking this medicine. Women should inform their doctor if they wish to become pregnant or think they might be pregnant. There is   a potential for serious side effects to an unborn child. Talk to your health care professional or pharmacist for more information. Do not breast-feed an infant while taking this medicine. Check with your doctor or health care professional if you get an attack of  severe diarrhea, nausea and vomiting, or if you sweat a lot. The loss of too much body fluid can make it dangerous for you to take this medicine. What side effects may I notice from receiving this medicine? Side effects that you should report to your doctor or health care professional as soon as possible: -allergic reactions like skin rash, itching or hives, swelling of the face, lips, or tongue -breathing problems -changes in hearing -changes in vision -fast, irregular heartbeat -feeling faint or lightheaded, falls -pain, tingling, numbness in the hands or feet -right upper belly pain -seizures -swelling of the ankles, feet, hands -unusual bleeding or bruising -unusually weak or tired -vomiting -yellowing of the eyes or skin Side effects that usually do not require medical attention (report to your doctor or health care professional if they continue or are bothersome): -changes in emotions or moods -constipation -diarrhea -loss of appetite -headache -irritation at site where injected -nausea This list may not describe all possible side effects. Call your doctor for medical advice about side effects. You may report side effects to FDA at 1-800-FDA-1088. Where should I keep my medicine? This drug is given in a hospital or clinic and will not be stored at home. NOTE: This sheet is a summary. It may not cover all possible information. If you have questions about this medicine, talk to your doctor, pharmacist, or health care provider.  2015, [employer]. (2013-09-04 12:46:32)  

## 2014-11-06 LAB — PROTEIN ELECTROPHORESIS, SERUM, WITH REFLEX
ALPHA-2-GLOBULIN: 8.4 % (ref 7.1–11.8)
Albumin ELP: 59.9 % (ref 55.8–66.1)
Alpha-1-Globulin: 4.6 % (ref 2.9–4.9)
Beta 2: 3.9 % (ref 3.2–6.5)
Beta Globulin: 5.1 % (ref 4.7–7.2)
Gamma Globulin: 18.1 % (ref 11.1–18.8)
M-Spike, %: 0.25 g/dL
TOTAL PROTEIN, SERUM ELECTROPHOR: 6.1 g/dL (ref 6.0–8.3)

## 2014-11-06 LAB — KAPPA/LAMBDA LIGHT CHAINS
Kappa free light chain: 7.34 mg/dL — ABNORMAL HIGH (ref 0.33–1.94)
Kappa:Lambda Ratio: 3.69 — ABNORMAL HIGH (ref 0.26–1.65)
LAMBDA FREE LGHT CHN: 1.99 mg/dL (ref 0.57–2.63)

## 2014-11-06 LAB — IGG, IGA, IGM
IgA: 20 mg/dL — ABNORMAL LOW (ref 68–379)
IgG (Immunoglobin G), Serum: 1220 mg/dL (ref 650–1600)
IgM, Serum: 25 mg/dL — ABNORMAL LOW (ref 41–251)

## 2014-11-06 LAB — IFE INTERPRETATION

## 2014-11-06 LAB — LACTATE DEHYDROGENASE: LDH: 120 U/L (ref 94–250)

## 2014-11-07 ENCOUNTER — Telehealth: Payer: Self-pay | Admitting: Hematology & Oncology

## 2014-11-07 ENCOUNTER — Encounter: Payer: Self-pay | Admitting: *Deleted

## 2014-11-07 NOTE — Telephone Encounter (Signed)
FMLA papers for his wife Rod Holler) are completed. Will give to pt on his next appt 11/09/2014.            COPY SCANNED

## 2014-11-07 NOTE — Progress Notes (Signed)
Received notice from [facility] that patient informed them that he doesn't need to refill his Revlimid prescription because he is now prepping for his transplant. Dr Marin Olp notified.

## 2014-11-08 ENCOUNTER — Other Ambulatory Visit: Payer: Self-pay | Admitting: Nurse Practitioner

## 2014-11-08 DIAGNOSIS — C9000 Multiple myeloma not having achieved remission: Secondary | ICD-10-CM

## 2014-11-09 ENCOUNTER — Ambulatory Visit (HOSPITAL_BASED_OUTPATIENT_CLINIC_OR_DEPARTMENT_OTHER): Payer: BC Managed Care – PPO

## 2014-11-09 ENCOUNTER — Other Ambulatory Visit: Payer: BC Managed Care – PPO | Admitting: Lab

## 2014-11-09 DIAGNOSIS — C9000 Multiple myeloma not having achieved remission: Secondary | ICD-10-CM

## 2014-11-09 DIAGNOSIS — Z5112 Encounter for antineoplastic immunotherapy: Secondary | ICD-10-CM

## 2014-11-09 MED ORDER — ONDANSETRON HCL 8 MG PO TABS
ORAL_TABLET | ORAL | Status: AC
  Filled 2014-11-09: qty 1

## 2014-11-09 MED ORDER — BORTEZOMIB CHEMO SQ INJECTION 3.5 MG (2.5MG/ML)
1.3000 mg/m2 | Freq: Once | INTRAMUSCULAR | Status: AC
  Administered 2014-11-09: 2.75 mg via SUBCUTANEOUS
  Filled 2014-11-09: qty 2.75

## 2014-11-09 MED ORDER — ONDANSETRON HCL 8 MG PO TABS
8.0000 mg | ORAL_TABLET | Freq: Once | ORAL | Status: AC
  Administered 2014-11-09: 8 mg via ORAL

## 2014-11-09 NOTE — Patient Instructions (Signed)
[facility] Discharge Instructions for Patients Receiving Chemotherapy  Today you received the following chemotherapy agents velcade.   To help prevent nausea and vomiting after your treatment, we encourage you to take your nausea medication as prescribed.   If you develop nausea and vomiting that is not controlled by your nausea medication, call the clinic.   BELOW ARE SYMPTOMS THAT SHOULD BE REPORTED IMMEDIATELY:  *FEVER GREATER THAN 100.5 F  *CHILLS WITH OR WITHOUT FEVER  NAUSEA AND VOMITING THAT IS NOT CONTROLLED WITH YOUR NAUSEA MEDICATION  *UNUSUAL SHORTNESS OF BREATH  *UNUSUAL BRUISING OR BLEEDING  TENDERNESS IN MOUTH AND THROAT WITH OR WITHOUT PRESENCE OF ULCERS  *URINARY PROBLEMS  *BOWEL PROBLEMS  UNUSUAL RASH Items with * indicate a potential emergency and should be followed up as soon as possible.  Feel free to call the clinic you have any questions or concerns. The clinic phone number is [phone].

## 2014-12-26 ENCOUNTER — Telehealth: Payer: Self-pay | Admitting: Hematology & Oncology

## 2014-12-26 NOTE — Telephone Encounter (Signed)
Pt canceled his appt, due to now getting chemo/labs  trreatments  at [facility]

## 2014-12-28 ENCOUNTER — Ambulatory Visit: Payer: BC Managed Care – PPO | Admitting: Family

## 2014-12-31 DIAGNOSIS — N529 Male erectile dysfunction, unspecified: Secondary | ICD-10-CM | POA: Insufficient documentation

## 2015-01-07 ENCOUNTER — Other Ambulatory Visit: Payer: BC Managed Care – PPO | Admitting: Lab

## 2015-02-07 ENCOUNTER — Other Ambulatory Visit: Payer: Self-pay | Admitting: Family

## 2015-02-26 DIAGNOSIS — Z52011 Autologous donor, stem cells: Secondary | ICD-10-CM | POA: Insufficient documentation

## 2015-02-26 DIAGNOSIS — Z52001 Unspecified donor, stem cells: Secondary | ICD-10-CM | POA: Insufficient documentation

## 2015-03-18 ENCOUNTER — Encounter: Payer: Self-pay | Admitting: Nurse Practitioner

## 2015-03-18 ENCOUNTER — Other Ambulatory Visit: Payer: Self-pay | Admitting: Nurse Practitioner

## 2015-03-18 DIAGNOSIS — C9000 Multiple myeloma not having achieved remission: Secondary | ICD-10-CM

## 2015-03-18 NOTE — Progress Notes (Signed)
Received a call from [place], Np with [facility] stating pt was to have a follow up with [place] in the next week or jso. This has been scheduled per Liliane Channel and the pt is aware. She stated he is s/p auto transplant and is doing well. He did have some issues with electrolyte imbalance but has now recovered. Pltz 117, Mg 1.9. This is day 16 for him and he did receive Mephalan. Orders were received and have been entered in computer for labs.

## 2015-03-18 NOTE — Progress Notes (Signed)
Received notice from [facility]. Pt is to receive weekly cbc, cmet, mg labs starting 5/2. Please fax/route results to [facility] [phone]). Pt has appointment with Dr. Laverta Baltimore on 5/25 @ 0915. Pt is to receive only filtered Irradiated leukoreduced blood products per orders. Parameters given with orders. Copy scanned to chart and located at nursing station.

## 2015-03-25 ENCOUNTER — Ambulatory Visit (HOSPITAL_BASED_OUTPATIENT_CLINIC_OR_DEPARTMENT_OTHER): Payer: BLUE CROSS/BLUE SHIELD | Admitting: Hematology & Oncology

## 2015-03-25 ENCOUNTER — Other Ambulatory Visit (HOSPITAL_BASED_OUTPATIENT_CLINIC_OR_DEPARTMENT_OTHER): Payer: BLUE CROSS/BLUE SHIELD

## 2015-03-25 VITALS — BP 115/75 | HR 79 | Temp 97.6°F | Wt 196.0 lb

## 2015-03-25 DIAGNOSIS — C9000 Multiple myeloma not having achieved remission: Secondary | ICD-10-CM

## 2015-03-25 LAB — CBC WITH DIFFERENTIAL (CANCER CENTER ONLY)
BASO#: 0 10*3/uL (ref 0.0–0.2)
BASO%: 0.5 % (ref 0.0–2.0)
EOS ABS: 0 10*3/uL (ref 0.0–0.5)
EOS%: 1 % (ref 0.0–7.0)
HCT: 33.1 % — ABNORMAL LOW (ref 38.7–49.9)
HEMOGLOBIN: 11.8 g/dL — AB (ref 13.0–17.1)
LYMPH#: 1.6 10*3/uL (ref 0.9–3.3)
LYMPH%: 38.8 % (ref 14.0–48.0)
MCH: 32.7 pg (ref 28.0–33.4)
MCHC: 35.6 g/dL (ref 32.0–35.9)
MCV: 92 fL (ref 82–98)
MONO#: 1 10*3/uL — ABNORMAL HIGH (ref 0.1–0.9)
MONO%: 25.1 % — ABNORMAL HIGH (ref 0.0–13.0)
NEUT%: 34.6 % — AB (ref 40.0–80.0)
NEUTROS ABS: 1.4 10*3/uL — AB (ref 1.5–6.5)
Platelets: 229 10*3/uL (ref 145–400)
RBC: 3.61 10*6/uL — ABNORMAL LOW (ref 4.20–5.70)
RDW: 14.5 % (ref 11.1–15.7)
WBC: 4.2 10*3/uL (ref 4.0–10.0)

## 2015-03-25 LAB — COMPREHENSIVE METABOLIC PANEL
ALBUMIN: 3.7 g/dL (ref 3.5–5.2)
ALK PHOS: 36 U/L — AB (ref 39–117)
ALT: 12 U/L (ref 0–53)
AST: 13 U/L (ref 0–37)
BUN: 9 mg/dL (ref 6–23)
CO2: 26 mEq/L (ref 19–32)
CREATININE: 1.09 mg/dL (ref 0.50–1.35)
Calcium: 8.9 mg/dL (ref 8.4–10.5)
Chloride: 105 mEq/L (ref 96–112)
GLUCOSE: 79 mg/dL (ref 70–99)
Potassium: 4.1 mEq/L (ref 3.5–5.3)
Sodium: 139 mEq/L (ref 135–145)
Total Bilirubin: 0.5 mg/dL (ref 0.2–1.2)
Total Protein: 6 g/dL (ref 6.0–8.3)

## 2015-03-25 LAB — MAGNESIUM: MAGNESIUM: 2 mg/dL (ref 1.5–2.5)

## 2015-03-25 NOTE — Progress Notes (Signed)
Hematology and Oncology Follow Up Visit  Frandy Basnett [facility] [id number] 1961/03/08 54 y.o. 03/25/2015   Principle Diagnosis:  IgG Kappa myeloma  Current Therapy:    Status post 8 cycles of Velcade/Revlimid/Decadron  Zometa 4 mg IV every month  Status post [place]'s stem cell transplant     Interim History:  Mr.  Rasmusson is in for followup.he now is back from [place]. He did undergo his them cell transplant. This was in early April. He tolerated this very well. He did have a brief hospitalization because of a low-grade fever.  He is going back to work next week.  He feels well. He's had no problem with nausea or vomiting. He's had no diarrhea. He's had no leg swelling. He's had no rashes.  Overall, he looks fantastic. His performance status is ECOG 1.  Medications:  Current outpatient prescriptions:  .  amLODipine (NORVASC) 5 MG tablet, TAKE 1 TABLET BY MOUTH DAILY, Disp: 90 tablet, Rfl: 1 .  famciclovir (FAMVIR) 250 MG tablet, Take 1 tablet (250 mg total) by mouth daily., Disp: 30 tablet, Rfl: 4 .  sildenafil (VIAGRA) 100 MG tablet, Take 1 tablet (100 mg total) by mouth daily as needed for erectile dysfunction., Disp: 10 tablet, Rfl: 2  Allergies: No Known Allergies  Past Medical History, Surgical history, Social history, and Family History were reviewed and updated.  Review of Systems: As above  Physical Exam:  weight is 196 lb (88.905 kg). His oral temperature is 97.6 F (36.4 C). His blood pressure is 115/75 and his pulse is 79.   Well-developed and well-nourished African American gentleman. Head and neck exam shows no ocular or oral lesions. He has no palpable cervical or supraclavicular fizzed. Lungs are clear. Cardiac exam regular rate and rhythm with no murmurs rubs or bruits. Abdomen is soft. Has good bowel sounds. There is no fluid wave. There is no palpable liver or spleen tip. Back exam shows no tenderness over the spine ribs or hips. Extremities shows no clubbing cyanosis or  edema. Neurological exam is nonfocal.  Lab Results  Component Value Date   WBC 4.2 03/25/2015   HGB 11.8* 03/25/2015   HCT 33.1* 03/25/2015   MCV 92 03/25/2015   PLT 229 03/25/2015     Chemistry      Component Value Date/Time   NA 136 11/02/2014 0819   NA 141 09/19/2014 1543   NA 137 02/02/2014 1557   K 3.8 11/02/2014 0819   K 3.5 09/19/2014 1543   K 3.8 02/02/2014 1557   CL 104 11/02/2014 0819   CL 103 02/02/2014 1557   CO2 26 11/02/2014 0819   CO2 23 09/19/2014 1543   CO2 27 02/02/2014 1557   BUN 9 11/02/2014 0819   BUN 7.6 09/19/2014 1543   BUN 12 02/02/2014 1557   CREATININE 0.9 11/02/2014 0819   CREATININE 1.2 09/19/2014 1543   CREATININE 1.22 02/02/2014 1557      Component Value Date/Time   CALCIUM 8.5 11/02/2014 0819   CALCIUM 8.9 09/19/2014 1543   CALCIUM 9.2 02/02/2014 1557   ALKPHOS 42 11/02/2014 0819   ALKPHOS 51 09/19/2014 1543   ALKPHOS 47 02/02/2014 1557   AST 21 11/02/2014 0819   AST 12 09/19/2014 1543   AST 16 02/02/2014 1557   ALT 22 11/02/2014 0819   ALT 16 09/19/2014 1543   ALT 15 02/02/2014 1557   BILITOT 1.10 11/02/2014 0819   BILITOT 0.49 09/19/2014 1543   BILITOT 0.4 02/02/2014 1557  Impression and Plan: Mr. Pendry is 54 year old gentleman with IgG kappa myeloma. He looks fantastic. Again, he had his transplant about a month ago.  Probably after Memorial Day, we can get him started on maintenance therapy with Revlimid. I think this would be feasible.  I would go ahead and get him started on 25 mg a day for 2 months and then get him on 10 mg a daily dose.  I will also do Zometa with him. I will plan to do this once every 3 months. I think this would be reasonable.  He comes with his wife. Again he looks fantastic.  I don't see that we have to do any bone marrow test on him.  We might want to consider another bone survey so we can see how things are looking with his bones.  I spent about 35 minutes with him  today.   [name], MD 5/2/20162:44 PM

## 2015-03-26 ENCOUNTER — Telehealth: Payer: Self-pay | Admitting: Hematology & Oncology

## 2015-03-26 NOTE — Telephone Encounter (Signed)
Left pt message on all lines to call and schedule weekly labs

## 2015-03-26 NOTE — Telephone Encounter (Signed)
Pt aware of weekly labs starting 5-9

## 2015-04-01 ENCOUNTER — Ambulatory Visit (HOSPITAL_BASED_OUTPATIENT_CLINIC_OR_DEPARTMENT_OTHER): Payer: BLUE CROSS/BLUE SHIELD

## 2015-04-01 ENCOUNTER — Other Ambulatory Visit: Payer: Self-pay | Admitting: Nurse Practitioner

## 2015-04-01 DIAGNOSIS — C9000 Multiple myeloma not having achieved remission: Secondary | ICD-10-CM

## 2015-04-01 LAB — COMPREHENSIVE METABOLIC PANEL (CC13)
ALT: 19 U/L (ref 0–55)
AST: 17 U/L (ref 5–34)
Albumin: 3.7 g/dL (ref 3.5–5.0)
Alkaline Phosphatase: 39 U/L — ABNORMAL LOW (ref 40–150)
Anion Gap: 12 mEq/L — ABNORMAL HIGH (ref 3–11)
BILIRUBIN TOTAL: 0.56 mg/dL (ref 0.20–1.20)
BUN: 10.4 mg/dL (ref 7.0–26.0)
CO2: 20 meq/L — AB (ref 22–29)
CREATININE: 1.2 mg/dL (ref 0.7–1.3)
Calcium: 8.6 mg/dL (ref 8.4–10.4)
Chloride: 109 mEq/L (ref 98–109)
EGFR: 82 mL/min/{1.73_m2} — ABNORMAL LOW (ref >90)
Glucose: 100 mg/dl (ref 70–140)
Potassium: 3.4 mEq/L — ABNORMAL LOW (ref 3.5–5.1)
Sodium: 142 mEq/L (ref 136–145)
Total Protein: 6.1 g/dL — ABNORMAL LOW (ref 6.4–8.3)

## 2015-04-01 LAB — CBC WITH DIFFERENTIAL (CANCER CENTER ONLY)
BASO#: 0 10*3/uL (ref 0.0–0.2)
BASO%: 0.7 % (ref 0.0–2.0)
EOS%: 6.1 % (ref 0.0–7.0)
Eosinophils Absolute: 0.3 10*3/uL (ref 0.0–0.5)
HEMATOCRIT: 34.7 % — AB (ref 38.7–49.9)
HGB: 12.4 g/dL — ABNORMAL LOW (ref 13.0–17.1)
LYMPH#: 1.3 10*3/uL (ref 0.9–3.3)
LYMPH%: 32.2 % (ref 14.0–48.0)
MCH: 33.1 pg (ref 28.0–33.4)
MCHC: 35.7 g/dL (ref 32.0–35.9)
MCV: 93 fL (ref 82–98)
MONO#: 0.7 10*3/uL (ref 0.1–0.9)
MONO%: 17.4 % — ABNORMAL HIGH (ref 0.0–13.0)
NEUT#: 1.8 10*3/uL (ref 1.5–6.5)
NEUT%: 43.6 % (ref 40.0–80.0)
Platelets: 217 10*3/uL (ref 145–400)
RBC: 3.75 10*6/uL — ABNORMAL LOW (ref 4.20–5.70)
RDW: 15 % (ref 11.1–15.7)
WBC: 4.1 10*3/uL (ref 4.0–10.0)

## 2015-04-01 LAB — MAGNESIUM (CC13): Magnesium: 2.3 mg/dl (ref 1.5–2.5)

## 2015-04-01 MED ORDER — PRAMOXINE HCL 1 % RE FOAM: 1.0000 "application " | Freq: Three times a day (TID) | RECTAL | Status: DC | PRN

## 2015-04-02 ENCOUNTER — Telehealth: Payer: Self-pay | Admitting: *Deleted

## 2015-04-02 MED ORDER — AMLODIPINE BESYLATE 5 MG PO TABS: 5.0000 mg | ORAL_TABLET | Freq: Every day | ORAL | Status: DC

## 2015-04-02 NOTE — Telephone Encounter (Signed)
Received rx for amlodipine besylate 5mg  from [facility]. Pt last seen in 09/2014 and advised 3 month follow up. Pt is past due. Refill sent to pharmacy.  Please call pt to arrange follow up before further refills are needed. 90 day supply sent to pharmacy.

## 2015-04-02 NOTE — Telephone Encounter (Signed)
Left message with woman to have patient return my call.

## 2015-04-03 NOTE — Telephone Encounter (Signed)
Left message for patient to return my call.

## 2015-04-03 NOTE — Telephone Encounter (Signed)
Mailed letter to pt

## 2015-04-04 ENCOUNTER — Telehealth: Payer: Self-pay | Admitting: *Deleted

## 2015-04-04 LAB — PROTEIN ELECTROPHORESIS, SERUM, WITH REFLEX
ALBUMIN ELP: 3.9 g/dL (ref 3.8–4.8)
ALPHA-1-GLOBULIN: 0.2 g/dL (ref 0.2–0.3)
ALPHA-2-GLOBULIN: 0.5 g/dL (ref 0.5–0.9)
BETA 2: 0.2 g/dL (ref 0.2–0.5)
Beta Globulin: 0.3 g/dL — ABNORMAL LOW (ref 0.4–0.6)
Gamma Globulin: 0.9 g/dL (ref 0.8–1.7)
TOTAL PROTEIN, SERUM ELECTROPHOR: 6.1 g/dL (ref 6.1–8.1)

## 2015-04-04 LAB — KAPPA/LAMBDA LIGHT CHAINS
KAPPA LAMBDA RATIO: 0.96 (ref 0.26–1.65)
Kappa free light chain: 0.72 mg/dL (ref 0.33–1.94)
Lambda Free Lght Chn: 0.75 mg/dL (ref 0.57–2.63)

## 2015-04-04 LAB — IGG, IGA, IGM
IGM, SERUM: 20 mg/dL — AB (ref 41–251)
IgA: 9 mg/dL — ABNORMAL LOW (ref 68–379)
IgG (Immunoglobin G), Serum: 1000 mg/dL (ref 650–1600)

## 2015-04-04 LAB — IFE INTERPRETATION

## 2015-04-04 NOTE — Telephone Encounter (Addendum)
Patient aware of results  ----- Message from [name], MD sent at 04/03/2015  6:11 PM EDT ----- Please call and let him know that there is no obvious myeloma protein in his blood. Thanks

## 2015-04-08 ENCOUNTER — Ambulatory Visit (HOSPITAL_BASED_OUTPATIENT_CLINIC_OR_DEPARTMENT_OTHER): Payer: BLUE CROSS/BLUE SHIELD | Admitting: Nurse Practitioner

## 2015-04-08 DIAGNOSIS — C9000 Multiple myeloma not having achieved remission: Secondary | ICD-10-CM | POA: Diagnosis not present

## 2015-04-08 LAB — CBC WITH DIFFERENTIAL (CANCER CENTER ONLY)
BASO#: 0 10*3/uL (ref 0.0–0.2)
BASO%: 0.5 % (ref 0.0–2.0)
EOS ABS: 0.4 10*3/uL (ref 0.0–0.5)
EOS%: 6.8 % (ref 0.0–7.0)
HCT: 35 % — ABNORMAL LOW (ref 38.7–49.9)
HEMOGLOBIN: 12.8 g/dL — AB (ref 13.0–17.1)
LYMPH#: 1.7 10*3/uL (ref 0.9–3.3)
LYMPH%: 27.3 % (ref 14.0–48.0)
MCH: 33.6 pg — ABNORMAL HIGH (ref 28.0–33.4)
MCHC: 36.6 g/dL — ABNORMAL HIGH (ref 32.0–35.9)
MCV: 92 fL (ref 82–98)
MONO#: 0.7 10*3/uL (ref 0.1–0.9)
MONO%: 11.6 % (ref 0.0–13.0)
NEUT#: 3.3 10*3/uL (ref 1.5–6.5)
NEUT%: 53.8 % (ref 40.0–80.0)
Platelets: 184 10*3/uL (ref 145–400)
RBC: 3.81 10*6/uL — AB (ref 4.20–5.70)
RDW: 15 % (ref 11.1–15.7)
WBC: 6 10*3/uL (ref 4.0–10.0)

## 2015-04-08 LAB — COMPREHENSIVE METABOLIC PANEL
ALK PHOS: 29 U/L — AB (ref 39–117)
ALT: 12 U/L (ref 0–53)
AST: 14 U/L (ref 0–37)
Albumin: 3.9 g/dL (ref 3.5–5.2)
BILIRUBIN TOTAL: 0.6 mg/dL (ref 0.2–1.2)
BUN: 9 mg/dL (ref 6–23)
CO2: 21 meq/L (ref 19–32)
CREATININE: 1.14 mg/dL (ref 0.50–1.35)
Calcium: 8.9 mg/dL (ref 8.4–10.5)
Chloride: 105 mEq/L (ref 96–112)
GLUCOSE: 99 mg/dL (ref 70–99)
Potassium: 3.8 mEq/L (ref 3.5–5.3)
Sodium: 137 mEq/L (ref 135–145)
Total Protein: 6.1 g/dL (ref 6.0–8.3)

## 2015-04-08 LAB — MAGNESIUM: Magnesium: 1.8 mg/dL (ref 1.5–2.5)

## 2015-04-09 LAB — TESTOSTERONE, FREE, TOTAL, SHBG
Sex Hormone Binding: 26 nmol/L (ref 10–50)
TESTOSTERONE-% FREE: 2.3 % (ref 1.6–2.9)
Testosterone, Free: 87 pg/mL (ref 47.0–244.0)
Testosterone: 378 ng/dL (ref 300–890)

## 2015-04-11 ENCOUNTER — Telehealth: Payer: Self-pay | Admitting: *Deleted

## 2015-04-11 NOTE — Telephone Encounter (Addendum)
Patient aware of results  ----- Message from [name], MD sent at 04/10/2015  5:21 PM EDT ----- Please call and tell him that the urine does not show any obvious myeloma protein. Thanks

## 2015-04-12 LAB — UIFE/LIGHT CHAINS/TP QN, 24-HR UR
Albumin, U: DETECTED
Alpha 1, Urine: DETECTED — AB
Alpha 2, Urine: DETECTED — AB
Beta, Urine: DETECTED — AB
Gamma Globulin, Urine: DETECTED — AB
TIME-UPE24: 24 h
TOTAL PROTEIN, URINE-UR/DAY: 54 mg/d (ref <150)
Total Protein, Urine: 9 mg/dL (ref 5–25)
Volume, Urine: 600 mL

## 2015-04-12 LAB — 24 HR URINE,KAPPA/LAMBDA LIGHT CHAINS
24H Urine Volume: 600 mL/24 h
Measured Kappa Chain: <0.4 mg/dL (ref <2.00)
Measured Lambda Chain: <0.4 mg/dL (ref <2.00)

## 2015-04-15 ENCOUNTER — Other Ambulatory Visit (HOSPITAL_BASED_OUTPATIENT_CLINIC_OR_DEPARTMENT_OTHER): Payer: BLUE CROSS/BLUE SHIELD

## 2015-04-15 DIAGNOSIS — C9000 Multiple myeloma not having achieved remission: Secondary | ICD-10-CM

## 2015-04-15 LAB — COMPREHENSIVE METABOLIC PANEL
ALBUMIN: 3.7 g/dL (ref 3.5–5.2)
ALK PHOS: 30 U/L — AB (ref 39–117)
ALT: 12 U/L (ref 0–53)
AST: 14 U/L (ref 0–37)
BILIRUBIN TOTAL: 0.6 mg/dL (ref 0.2–1.2)
BUN: 11 mg/dL (ref 6–23)
CHLORIDE: 106 meq/L (ref 96–112)
CO2: 24 mEq/L (ref 19–32)
Calcium: 8.3 mg/dL — ABNORMAL LOW (ref 8.4–10.5)
Creatinine, Ser: 1.21 mg/dL (ref 0.50–1.35)
Glucose, Bld: 101 mg/dL — ABNORMAL HIGH (ref 70–99)
Potassium: 4 mEq/L (ref 3.5–5.3)
Sodium: 137 mEq/L (ref 135–145)
Total Protein: 5.9 g/dL — ABNORMAL LOW (ref 6.0–8.3)

## 2015-04-15 LAB — CBC WITH DIFFERENTIAL (CANCER CENTER ONLY)
BASO#: 0 10*3/uL (ref 0.0–0.2)
BASO%: 0.5 % (ref 0.0–2.0)
EOS ABS: 0.3 10*3/uL (ref 0.0–0.5)
EOS%: 6.9 % (ref 0.0–7.0)
HEMATOCRIT: 35.2 % — AB (ref 38.7–49.9)
HGB: 12.6 g/dL — ABNORMAL LOW (ref 13.0–17.1)
LYMPH#: 1.3 10*3/uL (ref 0.9–3.3)
LYMPH%: 31.3 % (ref 14.0–48.0)
MCH: 33 pg (ref 28.0–33.4)
MCHC: 35.8 g/dL (ref 32.0–35.9)
MCV: 92 fL (ref 82–98)
MONO#: 0.5 10*3/uL (ref 0.1–0.9)
MONO%: 12.6 % (ref 0.0–13.0)
NEUT%: 48.7 % (ref 40.0–80.0)
NEUTROS ABS: 2 10*3/uL (ref 1.5–6.5)
Platelets: 180 10*3/uL (ref 145–400)
RBC: 3.82 10*6/uL — ABNORMAL LOW (ref 4.20–5.70)
RDW: 14.3 % (ref 11.1–15.7)
WBC: 4.1 10*3/uL (ref 4.0–10.0)

## 2015-04-15 LAB — MAGNESIUM: Magnesium: 1.9 mg/dL (ref 1.5–2.5)

## 2015-04-17 ENCOUNTER — Ambulatory Visit: Payer: BLUE CROSS/BLUE SHIELD | Admitting: Family

## 2015-04-26 ENCOUNTER — Emergency Department (HOSPITAL_COMMUNITY)
Admission: EM | Admit: 2015-04-26 | Discharge: 2015-04-26 | Disposition: A | Discharge status: Home / Self Care | Payer: BLUE CROSS/BLUE SHIELD | Attending: Emergency Medicine | Admitting: Emergency Medicine

## 2015-04-26 ENCOUNTER — Encounter (HOSPITAL_COMMUNITY): Payer: Self-pay | Admitting: *Deleted

## 2015-04-26 DIAGNOSIS — X58XXXA Exposure to other specified factors, initial encounter: Secondary | ICD-10-CM | POA: Diagnosis not present

## 2015-04-26 DIAGNOSIS — Z8639 Personal history of other endocrine, nutritional and metabolic disease: Secondary | ICD-10-CM | POA: Diagnosis not present

## 2015-04-26 DIAGNOSIS — T63441A Toxic effect of venom of bees, accidental (unintentional), initial encounter: Secondary | ICD-10-CM | POA: Diagnosis not present

## 2015-04-26 DIAGNOSIS — Y939 Activity, unspecified: Secondary | ICD-10-CM | POA: Insufficient documentation

## 2015-04-26 DIAGNOSIS — Z79899 Other long term (current) drug therapy: Secondary | ICD-10-CM | POA: Diagnosis not present

## 2015-04-26 DIAGNOSIS — Y999 Unspecified external cause status: Secondary | ICD-10-CM | POA: Diagnosis not present

## 2015-04-26 DIAGNOSIS — Z8579 Personal history of other malignant neoplasms of lymphoid, hematopoietic and related tissues: Secondary | ICD-10-CM | POA: Insufficient documentation

## 2015-04-26 DIAGNOSIS — S60861A Insect bite (nonvenomous) of right wrist, initial encounter: Secondary | ICD-10-CM | POA: Diagnosis present

## 2015-04-26 DIAGNOSIS — I10 Essential (primary) hypertension: Secondary | ICD-10-CM | POA: Insufficient documentation

## 2015-04-26 DIAGNOSIS — Y929 Unspecified place or not applicable: Secondary | ICD-10-CM | POA: Insufficient documentation

## 2015-04-26 DIAGNOSIS — Z9484 Stem cells transplant status: Secondary | ICD-10-CM | POA: Insufficient documentation

## 2015-04-26 MED ORDER — DIPHENHYDRAMINE HCL 25 MG PO CAPS
25.0000 mg | ORAL_CAPSULE | Freq: Once | ORAL | Status: AC
  Administered 2015-04-26: 25 mg via ORAL
  Filled 2015-04-26: qty 1

## 2015-04-26 NOTE — ED Provider Notes (Signed)
CSN: [id number]     Arrival date & time 04/26/15  2221 History  This chart was scribed for Montine Circle, PA-C, working with Wandra Arthurs, MD by Steva Colder, ED Scribe. The patient was seen in room TR10C/TR10C at 11:22 PM.    Chief Complaint  Patient presents with  . Insect Bite     The history is provided by the patient. No language interpreter was used.     HPI Comments: Jimmy Day is a 54 y.o. male who presents to the Emergency Department complaining of insect bite onset 3 hours ago PTA. He reports that he was stung by a yellow jacket on his right wrist. Pt rates his pain as 10/10 and is described as burning sensation, the area is localized to the right wrist. Pt reports that he had a stem cell transplant on 02/28/15 for multiple myeloma and he wanted to make sure that he will not have a reaction to the sting. He is having associated symptoms of mild redness to the sting site. He states that he has tried ice, gold bond, triple action cream gel, with no relief for his symptoms. He denies fever, and any other symptoms.    Past Medical History  Diagnosis Date  . Fasting hyperglycemia     NORMAL A1c  . Hyperlipidemia   . Testosterone deficiency     Dr Hartley Barefoot  . Hypertension   . Kappa light chain myeloma 02/02/2014   Past Surgical History  Procedure Laterality Date  . Lipomas removal      BENIGN FROM CHEST AND BACK  . Cervical plate insertion      POST COMPRESSION DISC INJURY   Family History  Problem Relation Age of Onset  . Breast cancer Mother   . Heart attack Father 66  . Kidney disease Brother     RENAL FAILURE  . Hypertension Brother   . Heart attack Maternal Aunt 65    CABG  . Stroke Maternal Uncle     CVA  . Diabetes Neg Hx   . Colon cancer Neg Hx   . Esophageal cancer Neg Hx   . Rectal cancer Neg Hx   . Stomach cancer Neg Hx    History  Substance Use Topics  . Smoking status: Never Smoker   . Smokeless tobacco: Never Used     Comment: never used tobacco   . Alcohol Use: 0.0 oz/week    0 Standard drinks or equivalent per week     Comment:  rarely    Review of Systems  Constitutional: Negative for fever.  Musculoskeletal: Positive for joint swelling.  Skin: Positive for color change (mild redness).       Yellow jacket sting to right wrist      Allergies  Review of patient's allergies indicates no known allergies.  Home Medications   Prior to Admission medications   Medication Sig Start Date End Date Taking? Authorizing Provider  amLODipine (NORVASC) 5 MG tablet Take 1 tablet (5 mg total) by mouth daily. 04/02/15   [name], NP  famciclovir (FAMVIR) 250 MG tablet Take 1 tablet (250 mg total) by mouth daily. 08/17/14   Eliezer Bottom, NP  pramoxine (PROCTOFOAM) 1 % foam Place 1 application rectally 3 (three) times daily as needed for itching or hemorrhoids. 04/01/15   [name], MD  sildenafil (VIAGRA) 100 MG tablet Take 1 tablet (100 mg total) by mouth daily as needed for erectile dysfunction. 06/25/14   [name], MD  BP 129/84 mmHg  Pulse 85  Temp(Src) 98.1 F (36.7 C) (Oral)  Resp 22  Ht '5\' 11"'  (1.803 m)  Wt 201 lb (91.173 kg)  BMI 28.05 kg/m2  SpO2 99% Physical Exam  Constitutional: He is oriented to person, place, and time. He appears well-developed and well-nourished. No distress.  HENT:  Head: Normocephalic and atraumatic.  Eyes: EOM are normal.  Neck: Neck supple. No tracheal deviation present.  Cardiovascular: Normal rate and intact distal pulses.   Intact distal pulses, brisk capillary refill  Pulmonary/Chest: Effort normal. No respiratory distress.  Musculoskeletal: Normal range of motion.  Neurological: He is alert and oriented to person, place, and time.  Sensation intact  Skin: Skin is warm and dry.  No evidence of cellulitis, abscess, retained foreign body/stinger  Psychiatric: He has a normal mood and affect. His behavior is normal.  Nursing note and vitals reviewed.   ED Course   Procedures (including critical care time) DIAGNOSTIC STUDIES: Oxygen Saturation is 99% on RA, nl by my interpretation.    COORDINATION OF CARE: 11:25 PM-Discussed treatment plan which includes benadryl, ice, alternate tylenol and ibuprofen with pt at bedside and pt agreed to plan.   Labs Review Labs Reviewed - No data to display  Imaging Review No results found.   EKG Interpretation None      MDM   Final diagnoses:  Bee sting, accidental or unintentional, initial encounter    Patient with bee sting. He has tried using ice with no relief. Complains of persistent pain. There is very mild swelling at the site. No evidence of abscess or infection. Will treat with Benadryl. Recommend primary care follow-up. Return precautions given. Patient understands and agrees with the plan. He is stable and ready for discharge.  I personally performed the services described in this documentation, which was scribed in my presence. The recorded information has been reviewed and is accurate.     Montine Circle, PA-C 04/26/15 2330  Wandra Arthurs, MD 04/26/15 [phone]

## 2015-04-26 NOTE — ED Notes (Signed)
The pot was stung by a yellow jacket 3 hours ago on the rt wrist.  He is still having pain .  Stem celkl transplant pt

## 2015-04-26 NOTE — Discharge Instructions (Signed)

## 2015-04-29 ENCOUNTER — Encounter: Payer: Self-pay | Admitting: Family

## 2015-04-29 ENCOUNTER — Ambulatory Visit (HOSPITAL_BASED_OUTPATIENT_CLINIC_OR_DEPARTMENT_OTHER): Payer: BLUE CROSS/BLUE SHIELD

## 2015-04-29 ENCOUNTER — Ambulatory Visit (HOSPITAL_BASED_OUTPATIENT_CLINIC_OR_DEPARTMENT_OTHER): Payer: BLUE CROSS/BLUE SHIELD | Admitting: Family

## 2015-04-29 ENCOUNTER — Other Ambulatory Visit (HOSPITAL_BASED_OUTPATIENT_CLINIC_OR_DEPARTMENT_OTHER): Payer: BLUE CROSS/BLUE SHIELD

## 2015-04-29 ENCOUNTER — Ambulatory Visit (INDEPENDENT_AMBULATORY_CARE_PROVIDER_SITE_OTHER): Payer: BLUE CROSS/BLUE SHIELD | Admitting: Family

## 2015-04-29 VITALS — BP 129/88 | HR 61 | Temp 97.8°F | Resp 18

## 2015-04-29 VITALS — BP 138/80 | HR 60 | Temp 97.9°F | Resp 16 | Ht 72.0 in | Wt 202.0 lb

## 2015-04-29 DIAGNOSIS — C9000 Multiple myeloma not having achieved remission: Secondary | ICD-10-CM

## 2015-04-29 DIAGNOSIS — E559 Vitamin D deficiency, unspecified: Secondary | ICD-10-CM

## 2015-04-29 DIAGNOSIS — Z9484 Stem cells transplant status: Secondary | ICD-10-CM

## 2015-04-29 DIAGNOSIS — N529 Male erectile dysfunction, unspecified: Secondary | ICD-10-CM | POA: Diagnosis not present

## 2015-04-29 DIAGNOSIS — E785 Hyperlipidemia, unspecified: Secondary | ICD-10-CM

## 2015-04-29 DIAGNOSIS — I10 Essential (primary) hypertension: Secondary | ICD-10-CM

## 2015-04-29 DIAGNOSIS — N521 Erectile dysfunction due to diseases classified elsewhere: Secondary | ICD-10-CM | POA: Diagnosis not present

## 2015-04-29 DIAGNOSIS — R531 Weakness: Secondary | ICD-10-CM

## 2015-04-29 DIAGNOSIS — K5901 Slow transit constipation: Secondary | ICD-10-CM

## 2015-04-29 DIAGNOSIS — N289 Disorder of kidney and ureter, unspecified: Secondary | ICD-10-CM

## 2015-04-29 LAB — CBC WITH DIFFERENTIAL (CANCER CENTER ONLY)
BASO#: 0 10*3/uL (ref 0.0–0.2)
BASO%: 0.2 % (ref 0.0–2.0)
EOS ABS: 0.2 10*3/uL (ref 0.0–0.5)
EOS%: 4.8 % (ref 0.0–7.0)
HCT: 38.5 % — ABNORMAL LOW (ref 38.7–49.9)
HGB: 13.9 g/dL (ref 13.0–17.1)
LYMPH#: 1.7 10*3/uL (ref 0.9–3.3)
LYMPH%: 37.7 % (ref 14.0–48.0)
MCH: 33.3 pg (ref 28.0–33.4)
MCHC: 36.1 g/dL — ABNORMAL HIGH (ref 32.0–35.9)
MCV: 92 fL (ref 82–98)
MONO#: 0.5 10*3/uL (ref 0.1–0.9)
MONO%: 10.4 % (ref 0.0–13.0)
NEUT%: 46.9 % (ref 40.0–80.0)
NEUTROS ABS: 2.2 10*3/uL (ref 1.5–6.5)
Platelets: 188 10*3/uL (ref 145–400)
RBC: 4.17 10*6/uL — ABNORMAL LOW (ref 4.20–5.70)
RDW: 13.5 % (ref 11.1–15.7)
WBC: 4.6 10*3/uL (ref 4.0–10.0)

## 2015-04-29 LAB — CMP (CANCER CENTER ONLY)
ALT(SGPT): 21 U/L (ref 10–47)
AST: 23 U/L (ref 11–38)
Albumin: 4 g/dL (ref 3.3–5.5)
Alkaline Phosphatase: 33 U/L (ref 26–84)
BUN: 11 mg/dL (ref 7–22)
CALCIUM: 8.8 mg/dL (ref 8.0–10.3)
CHLORIDE: 105 meq/L (ref 98–108)
CO2: 26 meq/L (ref 18–33)
Creat: 1.2 mg/dl (ref 0.6–1.2)
Glucose, Bld: 97 mg/dL (ref 73–118)
POTASSIUM: 3.7 meq/L (ref 3.3–4.7)
Sodium: 134 mEq/L (ref 128–145)
Total Bilirubin: 1.2 mg/dl (ref 0.20–1.60)
Total Protein: 6.7 g/dL (ref 6.4–8.1)

## 2015-04-29 LAB — LIPID PANEL
CHOLESTEROL: 163 mg/dL (ref 0–200)
HDL: 34.3 mg/dL — ABNORMAL LOW (ref >39.00)
LDL Cholesterol: 100 mg/dL — ABNORMAL HIGH (ref 0–99)
NONHDL: 128.7
Total CHOL/HDL Ratio: 5
Triglycerides: 142 mg/dL (ref 0.0–149.0)
VLDL: 28.4 mg/dL (ref 0.0–40.0)

## 2015-04-29 LAB — VITAMIN D 25 HYDROXY (VIT D DEFICIENCY, FRACTURES): VITD: 11.82 ng/mL — ABNORMAL LOW (ref 30.00–100.00)

## 2015-04-29 LAB — MAGNESIUM: MAGNESIUM: 2 mg/dL (ref 1.5–2.5)

## 2015-04-29 MED ORDER — ZOLEDRONIC ACID 4 MG/100ML IV SOLN
4.0000 mg | Freq: Once | INTRAVENOUS | Status: AC
  Administered 2015-04-29: 4 mg via INTRAVENOUS
  Filled 2015-04-29: qty 100

## 2015-04-29 MED ORDER — SILDENAFIL CITRATE 100 MG PO TABS: 100.0000 mg | ORAL_TABLET | Freq: Every day | ORAL | Status: DC | PRN

## 2015-04-29 MED ORDER — SODIUM CHLORIDE 0.9 % IV SOLN
INTRAVENOUS | Status: DC
  Administered 2015-04-29: 10:00:00 via INTRAVENOUS

## 2015-04-29 MED ORDER — LENALIDOMIDE 25 MG PO CAPS: 25.0000 mg | ORAL_CAPSULE | Freq: Every day | ORAL | Status: DC

## 2015-04-29 MED ORDER — AMLODIPINE BESYLATE 5 MG PO TABS: 5.0000 mg | ORAL_TABLET | Freq: Every day | ORAL | Status: DC

## 2015-04-29 NOTE — Patient Instructions (Signed)
Please complete lab work prior to leaving. Follow up in 4 months.  

## 2015-04-29 NOTE — Assessment & Plan Note (Signed)
S/p stem cell transplant at [facility], following with Dr. Marin Olp.

## 2015-04-29 NOTE — Assessment & Plan Note (Signed)
Obtain vit D level. If low resume supplement.

## 2015-04-29 NOTE — Progress Notes (Signed)
Pre visit review using our clinic review tool, if applicable. No additional management support is needed unless otherwise documented below in the visit note. 

## 2015-04-29 NOTE — Assessment & Plan Note (Signed)
BP stable on current dose of amlodipine. Continue same.

## 2015-04-29 NOTE — Assessment & Plan Note (Signed)
He is no longer on statin.  Will check FLP. Resume statin if elevated.

## 2015-04-29 NOTE — Patient Instructions (Signed)

## 2015-04-29 NOTE — Progress Notes (Signed)
Hematology and Oncology Follow Up Visit  Wilbon Obenchain [facility] [id number] September 14, 1961 54 y.o. 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 Stock is here today for a follow-up. He had his stem cell transplant in April and has recuperated nicely. He had one episode with a low grade fever and was hospitalized. Since then he has had no more issues.  He has not been taking his Revlimid but will start back today. He will take 25 mg daily 21 days on 7 days off for 2 months and then 10 mg daily 21 days on 7 days off. He follows up with [facility] monthly for now. His next appointment in on July 8th.  His energy is improving. He is now back to work and enjoying himself.  He does have some weakness in his knees and would like a physical therapy consult. He has no new aches or pains. No swelling, tenderness, numbness or tingling in his extremities.  He has had no fever, chills, n/v, cough, rash, dizziness, SOB, chest pain, palpitations, abdominal pain, constipation, diarrhea, blood in urine or stool.  No lymphadenopathy found on assessment.  His appetite is good and he is staying hydrated. His weight is stable.   Medications:    Medication List       This list is accurate as of: 04/29/15 11:31 AM.  Always use your most recent med list.               acyclovir 400 MG tablet  Commonly known as:  ZOVIRAX  Take 400 mg by mouth 2 (two) times daily.     amLODipine 5 MG tablet  Commonly known as:  NORVASC  Take 1 tablet (5 mg total) by mouth daily.     lenalidomide 25 MG capsule  Commonly known as:  REVLIMID  Take 1 capsule (25 mg total) by mouth daily. 21 days on 7 days off for 2 months.     pramoxine 1 % foam  Commonly known as:  PROCTOFOAM  Place 1 application rectally 3 (three) times daily as needed for itching or  hemorrhoids.     sildenafil 100 MG tablet  Commonly known as:  VIAGRA  Take 1 tablet (100 mg total) by mouth daily as needed for erectile dysfunction.        Allergies: No Known Allergies  Past Medical History, Surgical history, Social history, and Family History were reviewed and updated.  Review of Systems: All other 10 point review of systems is negative.   Physical Exam:  oral temperature is 97.8 F (36.6 C). His blood pressure is 129/88 and his pulse is 61. His respiration is 18.   Wt Readings from Last 3 Encounters:  04/29/15 202 lb (91.627 kg)  04/26/15 201 lb (91.173 kg)  03/25/15 196 lb (88.905 kg)    Ocular: Sclerae unicteric, pupils equal, round and reactive to light Ear-nose-throat: Oropharynx clear, dentition fair Lymphatic: No cervical or supraclavicular adenopathy Lungs no rales or rhonchi, good excursion bilaterally Heart regular rate and rhythm, no murmur appreciated Abd soft, nontender, positive bowel sounds MSK no focal spinal tenderness, no joint edema Neuro: non-focal, well-oriented, appropriate affect  Lab Results  Component Value Date   WBC 4.6 04/29/2015   HGB 13.9 04/29/2015   HCT 38.5* 04/29/2015   MCV 92 04/29/2015   PLT 188  04/29/2015   Lab Results  Component Value Date   FERRITIN 152 08/17/2014   IRON 106 08/17/2014   TIBC 253 08/17/2014   UIBC 146 08/17/2014   IRONPCTSAT 42 08/17/2014   Lab Results  Component Value Date   RBC 4.17* 04/29/2015   Lab Results  Component Value Date   KPAFRELGTCHN 0.72 04/01/2015   LAMBDASER 0.75 04/01/2015   KAPLAMBRATIO 0.96 04/01/2015   Lab Results  Component Value Date   IGGSERUM 1000 04/01/2015   IGA 9* 04/01/2015   IGMSERUM 20* 04/01/2015   Lab Results  Component Value Date   TOTALPROTELP 6.1 04/01/2015   ALBUMINELP 3.9 04/01/2015   A1GS 0.2 04/01/2015   A2GS 0.5 04/01/2015   BETS 0.3* 04/01/2015   BETA2SER 0.2 04/01/2015   GAMS 0.9 04/01/2015   MSPIKE 0.25 11/02/2014   SPEI *  04/01/2015     Chemistry      Component Value Date/Time   NA 134 04/29/2015 0834   NA 137 04/15/2015 0827   NA 142 04/01/2015 0834   K 3.7 04/29/2015 0834   K 4.0 04/15/2015 0827   K 3.4* 04/01/2015 0834   CL 105 04/29/2015 0834   CL 106 04/15/2015 0827   CO2 26 04/29/2015 0834   CO2 24 04/15/2015 0827   CO2 20* 04/01/2015 0834   BUN 11 04/29/2015 0834   BUN 11 04/15/2015 0827   BUN 10.4 04/01/2015 0834   CREATININE 1.2 04/29/2015 0834   CREATININE 1.21 04/15/2015 0827   CREATININE 1.2 04/01/2015 0834      Component Value Date/Time   CALCIUM 8.8 04/29/2015 0834   CALCIUM 8.3* 04/15/2015 0827   CALCIUM 8.6 04/01/2015 0834   ALKPHOS 33 04/29/2015 0834   ALKPHOS 30* 04/15/2015 0827   ALKPHOS 39* 04/01/2015 0834   AST 23 04/29/2015 0834   AST 14 04/15/2015 0827   AST 17 04/01/2015 0834   ALT 21 04/29/2015 0834   ALT 12 04/15/2015 0827   ALT 19 04/01/2015 0834   BILITOT 1.20 04/29/2015 0834   BILITOT 0.6 04/15/2015 0827   BILITOT 0.56 04/01/2015 0834     Impression and Plan: Mr. Gittleman is 54 year old gentleman with IgG kappa myeloma s/p autologous stem cell transplant in April 2016. He has really done very well. He is asymptomatic at this time.  We will start him on Revlimid 25 mg daily 21 days on 7 days off for 2 months and then go to 10 mg daily 21 days on 7 days off. Prescription sent to pharmacy.  He will receive Zometa every 3 months starting today.  We will consult PT for his knee weakness.  We will plan to see him back in 1 months for labs and follow-up.  He knows to contact us with any questions or concerns. We can certainly see him sooner if need be.   Eliezer Bottom, NP 6/6/201611:31 AM

## 2015-04-29 NOTE — Progress Notes (Signed)
Subjective:    Patient ID: Jimmy Day, male    DOB: December 05, 1960, 54 y.o.   MRN: [id number]  HPI  Jimmy Day is a 54 yr old male who presents today for follow up.  Patient presents today for follow up of multiple medical problems.  Hyperlipidemia  Patient is currently maintained on the following medication for hyperlipidemia: none (previously on statin) Last lipid panel as follows:  Lab Results  Component Value Date   CHOL 122 09/28/2014   HDL 36.30* 09/28/2014   LDLCALC 69 09/28/2014   LDLDIRECT 88.5 08/07/2013   TRIG 85.0 09/28/2014   CHOLHDL 3 09/28/2014   Patient denies myalgia. Patient reports good compliance with low fat/low cholesterol diet.   Hypertension  Patient is currently maintained on the following medications for blood pressure: amlodipine Patient reports good compliance with blood pressure medications. Patient denies chest pain, shortness of breath or swelling. Last 3 blood pressure readings in our office are as follows: BP Readings from Last 3 Encounters:  04/29/15 138/80  04/26/15 129/84  03/25/15 115/75   Myeloma- He is followed by Dr. Marin Olp and at [facility] (had stemp cell transplant in early April).    Vit D deficiency-not currently on supplement.  Review of Systems See HPI  Past Medical History  Diagnosis Date  . Fasting hyperglycemia     NORMAL A1c  . Hyperlipidemia   . Testosterone deficiency     Dr Hartley Barefoot  . Hypertension   . Kappa light chain myeloma 02/02/2014    History   Social History  . Marital Status: Married    Spouse Name: N/A  . Number of Children: 3  . Years of Education: N/A   Occupational History  . Truck Geophysicist/field seismologist    Social History Main Topics  . Smoking status: Never Smoker   . Smokeless tobacco: Never Used     Comment: never used tobacco  . Alcohol Use: 0.0 oz/week    0 Standard drinks or equivalent per week     Comment:  rarely  . Drug Use: No  . Sexual Activity: Yes    Birth Control/ Protection: Condom    Other Topics Concern  . Not on file   Social History Narrative   Son in [place] in [place]   Daughter at [employer] state   Married   Truck driver   Enjoys exercise, basketball, watch sports, work around [employer] in Program[employer], applications       Past Surgical History  Procedure Laterality Date  . Lipomas removal      BENIGN FROM CHEST AND BACK  . Cervical plate insertion      POST COMPRESSION DISC INJURY    Family History  Problem Relation Age of Onset  . Breast cancer Mother   . Heart attack Father 26  . Kidney disease Brother     RENAL FAILURE  . Hypertension Brother   . Heart attack Maternal Aunt 65    CABG  . Stroke Maternal Uncle     CVA  . Diabetes Neg Hx   . Colon cancer Neg Hx   . Esophageal cancer Neg Hx   . Rectal cancer Neg Hx   . Stomach cancer Neg Hx     No Known Allergies  Current Outpatient Prescriptions on File Prior to Visit  Medication Sig Dispense Refill  . amLODipine (NORVASC) 5 MG tablet Take 1 tablet (5 mg total) by mouth daily. 90 tablet 0  . pramoxine (PROCTOFOAM) 1 %  foam Place 1 application rectally 3 (three) times daily as needed for itching or hemorrhoids. 15 g 3  . sildenafil (VIAGRA) 100 MG tablet Take 1 tablet (100 mg total) by mouth daily as needed for erectile dysfunction. 10 tablet 2   No current facility-administered medications on file prior to visit.    BP 138/80 mmHg  Pulse 60  Temp(Src) 97.9 F (36.6 C) (Oral)  Resp 16  Ht 6' (1.829 m)  Wt 202 lb (91.627 kg)  BMI 27.39 kg/m2  SpO2 99%       Objective:   Physical Exam  Constitutional: He is oriented to person, place, and time. He appears well-developed and well-nourished. No distress.  HENT:  Head: Normocephalic and atraumatic.  Cardiovascular: Normal rate and regular rhythm.   No murmur heard. Pulmonary/Chest: Effort normal and breath sounds normal. No respiratory distress. He has no wheezes. He has no rales.  Musculoskeletal: He exhibits no  edema.  Neurological: He is alert and oriented to person, place, and time.  Skin: Skin is warm and dry.  Psychiatric: He has a normal mood and affect. His behavior is normal. Thought content normal.          Assessment & Plan:

## 2015-05-02 ENCOUNTER — Telehealth: Payer: Self-pay | Admitting: Family

## 2015-05-02 DIAGNOSIS — E559 Vitamin D deficiency, unspecified: Secondary | ICD-10-CM

## 2015-05-02 MED ORDER — VITAMIN D (ERGOCALCIFEROL) 1.25 MG (50000 UNIT) PO CAPS: 50000.0000 [IU] | ORAL_CAPSULE | ORAL | Status: DC

## 2015-05-02 NOTE — Telephone Encounter (Signed)
Vit d still very low. rec vit D once weekly x 12 weeks, then repeat level dx vit d deficiency. Cholesterol looks good except HDL "good cholesterol" should be higher. Exercise can help raise HDL.

## 2015-05-03 NOTE — Telephone Encounter (Signed)
Left message on cell# to check mychart message that was sent today. Lab order entered.

## 2015-05-07 ENCOUNTER — Other Ambulatory Visit: Payer: Self-pay | Admitting: *Deleted

## 2015-05-07 DIAGNOSIS — N289 Disorder of kidney and ureter, unspecified: Secondary | ICD-10-CM

## 2015-05-07 DIAGNOSIS — C9000 Multiple myeloma not having achieved remission: Secondary | ICD-10-CM

## 2015-05-07 MED ORDER — LENALIDOMIDE 25 MG PO CAPS: 25.0000 mg | ORAL_CAPSULE | Freq: Every day | ORAL | Status: DC

## 2015-05-09 ENCOUNTER — Telehealth: Payer: Self-pay | Admitting: Family

## 2015-05-09 NOTE — Telephone Encounter (Signed)
Caller name: Jimmy Day Relationship to patient: self Can be reached: [phone] Pharmacy: [facility]  Reason for call: Pt called in to see if he is to continue taking atorvastation. He said that it was not mentioned at his appt 04/29/15. Please notify pt either way. If he is to continue he will need refills sent in.

## 2015-05-09 NOTE — Telephone Encounter (Signed)
Notified pt that per last office note we have that he was not taking atorvastatin. Pt confirmed and states he has been off of it since April when he had his stem cell transplant. Advised him that recent labs were good and no need to restart atorvastatin at this time. Continue diet and exercise and if cholesterol elevates in the future may need to resume. Pt voices understanding.

## 2015-05-21 ENCOUNTER — Ambulatory Visit: Payer: BLUE CROSS/BLUE SHIELD | Attending: Family | Admitting: Physical Therapy

## 2015-05-21 ENCOUNTER — Encounter: Payer: Self-pay | Admitting: Physical Therapy

## 2015-05-21 DIAGNOSIS — M25562 Pain in left knee: Secondary | ICD-10-CM | POA: Diagnosis not present

## 2015-05-21 DIAGNOSIS — M6289 Other specified disorders of muscle: Secondary | ICD-10-CM | POA: Insufficient documentation

## 2015-05-21 DIAGNOSIS — M25561 Pain in right knee: Secondary | ICD-10-CM | POA: Insufficient documentation

## 2015-05-21 DIAGNOSIS — R29898 Other symptoms and signs involving the musculoskeletal system: Secondary | ICD-10-CM

## 2015-05-21 NOTE — Therapy (Signed)
[facility] [address]  [place], [place], [address] Phone: [phone]   Fax:  [phone]  Physical Therapy Evaluation  Patient Details  Name: Jimmy Day MRN: [id number] Date of Birth: 08/06/1961 Referring Provider:  Eliezer Bottom, NP  Encounter Date: 05/21/2015      PT End of Session - 05/21/15 1637    Visit Number 1   Number of Visits 12   Date for PT Re-Evaluation 07/02/15   PT Start Time 1628   PT Stop Time 1702   PT Time Calculation (min) 34 min      Past Medical History  Diagnosis Date  . Fasting hyperglycemia     NORMAL A1c  . Hyperlipidemia   . Testosterone deficiency     Dr Hartley Barefoot  . Hypertension   . Kappa light chain myeloma 02/02/2014    Past Surgical History  Procedure Laterality Date  . Lipomas removal      BENIGN FROM CHEST AND BACK  . Cervical plate insertion      POST COMPRESSION DISC INJURY    There were no vitals filed for this visit.  Visit Diagnosis:  Knee pain, bilateral - Plan: PT plan of care cert/re-cert  Weakness of both hips - Plan: PT plan of care cert/re-cert      Subjective Assessment - 05/21/15 1633    Subjective Pt with c/o B knee pain and LE weakness since undergoing CA treatment which ended in April 2016.  Prior to this pt high level function and no c/o pain.   Patient Stated Goals no pain in knees   Currently in Pain? Yes   Pain Score --  pain 7/10 with sit->stand transfers, pain-free with walking, 4/10 with stairs   Pain Location Knee   Pain Orientation Left;Right   Pain Frequency Intermittent   Aggravating Factors  sit->stand, stairs   Pain Relieving Factors rest            [facility] PT Assessment - 05/21/15 0001    Assessment   Medical Diagnosis B Knee pain   Onset Date/Surgical Date 02/22/15   Balance Screen   Has the patient fallen in the past 6 months No   Has the patient had a decrease in activity level because of a fear of  falling?  No   Is the patient reluctant to leave their home because of a fear of falling?  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   Leisure Prior to CA exercised regularly at home (basketball, running, and some Charity fundraiser)   Observation/Other Assessments   Focus on Therapeutic Outcomes (FOTO)  44% limitation   ROM / Strength   AROM / PROM / Strength AROM;Strength   AROM   Overall AROM Comments B LE WNL without c/o pain   PROM   Overall PROM Comments mild pain L Knee with OP into Flexion   Strength   Strength Assessment Site Hip   Right/Left Hip Right;Left   Right Hip Flexion 4+/5   Right Hip Extension 4/5   Right Hip External Rotation  --  5/5   Right Hip Internal Rotation  --  5/5   Right Hip ABduction 4/5   Right Hip ADduction 5/5   Left Hip Flexion 4/5   Left Hip Extension 4/5   Left Hip External Rotation  --  4/5 - knee pain   Left Hip Internal Rotation  --  4+/5   Left Hip ABduction 4/5   Left Hip ADduction 5/5   Right/Left Knee Right;Left   Right Knee Flexion 4/5   Right Knee Extension 4+/5   Left Knee Flexion 4/5  anterior knee pain   Left Knee Extension 4+/5  anterior knee pain           TODAY'S TREATMENT Seated patellar inferior glides Bridge 10x Bridge with Alt Knee Ext 5x SLR with slight ER 10x each                PT Education - 05/21/15 1659    Education provided Yes   Education Details HEP   Person(s) Educated Patient   Methods Explanation;Demonstration;Handout   Comprehension Verbalized understanding;Returned demonstration          PT Short Term Goals - 05/21/15 1729    PT SHORT TERM GOAL #1   Title pt independent with initial HEP by 05/30/15   Status New           PT Long Term Goals - 05/21/15 1729    PT LONG TERM GOAL #1   Title pt able to perform sit->stand transfers without c/o knee pain by 07/02/15   Status New   PT LONG TERM GOAL #2   Title B Hip  and Knee MMT 4+/5 or better grossly without c/o pain by 07/02/15   Status New               Plan - 05/21/15 1717    Clinical Impression Statement Pt with c/o B knee pain and B LE weakness since undergoing CA treatment which include stem cell transplant in April 2016.  Assessment today does reveal weakness along with rapid fatigue to B LE.  Pt's c/o knee pain seems patellofemoral pain syndrome due to recent onset of weakness causing abnormal patellar tracking. Mild tightness noted B hip flexors and quadriceps as well as L hamstring.  All ligament testing normal B knees but possible meniscus vs OA issue in L knee (reproducible clicking with [place]).   Pt will benefit from skilled therapeutic intervention in order to improve on the following deficits Pain;Decreased strength;Decreased activity tolerance;Decreased endurance   Rehab Potential Good   PT Frequency 2x / week   PT Duration 6 weeks   PT Treatment/Interventions Therapeutic exercise;Therapeutic activities;Manual techniques;Taping;Patient/family education;Gait training;Stair training   PT Next Visit Plan B Hip stability training, patellar mobes, VMO strengthening   Consulted and Agree with Plan of Care Patient         Problem List Patient Active Problem List   Diagnosis Date Noted  . 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 Vitamin D deficiency 10/01/2014  . Essential (primary) hypertension 09/19/2014  . Kappa light chain myeloma 02/02/2014  . 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 Abnormal findings on diagnostic imaging of spine 03/03/2013  . Hypogonadism in male 04/08/2009  . Hyperlipidemia 04/08/2009  . Fasting hyperglycemia 04/08/2009  . NONSPECIFIC ABNORMAL ELECTROCARDIOGRAM 04/08/2009  . HTN (hypertension) 04/08/2009    Kayla Deshaies PT, OCS 05/21/2015, 5:47 PM  [facility] [address]  [place], [place], [address] Phone: [phone]   Fax:  [phone]

## 2015-05-29 ENCOUNTER — Other Ambulatory Visit: Payer: Self-pay | Admitting: *Deleted

## 2015-05-29 DIAGNOSIS — N289 Disorder of kidney and ureter, unspecified: Secondary | ICD-10-CM

## 2015-05-29 DIAGNOSIS — C9000 Multiple myeloma not having achieved remission: Secondary | ICD-10-CM

## 2015-05-29 MED ORDER — LENALIDOMIDE 25 MG PO CAPS: 25.0000 mg | ORAL_CAPSULE | Freq: Every day | ORAL | Status: DC

## 2015-05-30 ENCOUNTER — Ambulatory Visit: Payer: 59 | Admitting: Rehabilitation

## 2015-06-03 ENCOUNTER — Telehealth: Payer: Self-pay | Admitting: *Deleted

## 2015-06-03 ENCOUNTER — Other Ambulatory Visit (HOSPITAL_BASED_OUTPATIENT_CLINIC_OR_DEPARTMENT_OTHER): Payer: 59

## 2015-06-03 ENCOUNTER — Ambulatory Visit (HOSPITAL_BASED_OUTPATIENT_CLINIC_OR_DEPARTMENT_OTHER): Payer: 59 | Admitting: Hematology & Oncology

## 2015-06-03 VITALS — BP 127/80 | HR 53 | Temp 97.1°F | Resp 18 | Wt 202.1 lb

## 2015-06-03 DIAGNOSIS — N289 Disorder of kidney and ureter, unspecified: Secondary | ICD-10-CM

## 2015-06-03 DIAGNOSIS — C9000 Multiple myeloma not having achieved remission: Secondary | ICD-10-CM | POA: Diagnosis not present

## 2015-06-03 DIAGNOSIS — Z9484 Stem cells transplant status: Secondary | ICD-10-CM

## 2015-06-03 LAB — CBC WITH DIFFERENTIAL (CANCER CENTER ONLY)
BASO#: 0 10*3/uL (ref 0.0–0.2)
BASO%: 1.1 % (ref 0.0–2.0)
EOS%: 12 % — ABNORMAL HIGH (ref 0.0–7.0)
Eosinophils Absolute: 0.4 10*3/uL (ref 0.0–0.5)
HCT: 39.2 % (ref 38.7–49.9)
HEMOGLOBIN: 14 g/dL (ref 13.0–17.1)
LYMPH#: 1.3 10*3/uL (ref 0.9–3.3)
LYMPH%: 35.2 % (ref 14.0–48.0)
MCH: 32.8 pg (ref 28.0–33.4)
MCHC: 35.7 g/dL (ref 32.0–35.9)
MCV: 92 fL (ref 82–98)
MONO#: 0.6 10*3/uL (ref 0.1–0.9)
MONO%: 15.3 % — AB (ref 0.0–13.0)
NEUT%: 36.4 % — ABNORMAL LOW (ref 40.0–80.0)
NEUTROS ABS: 1.3 10*3/uL — AB (ref 1.5–6.5)
Platelets: 188 10*3/uL (ref 145–400)
RBC: 4.27 10*6/uL (ref 4.20–5.70)
RDW: 12.7 % (ref 11.1–15.7)
WBC: 3.7 10*3/uL — AB (ref 4.0–10.0)

## 2015-06-03 LAB — CMP (CANCER CENTER ONLY)
ALT: 23 U/L (ref 10–47)
AST: 24 U/L (ref 11–38)
Albumin: 4 g/dL (ref 3.3–5.5)
Alkaline Phosphatase: 44 U/L (ref 26–84)
BUN, Bld: 8 mg/dL (ref 7–22)
CALCIUM: 9 mg/dL (ref 8.0–10.3)
CO2: 29 meq/L (ref 18–33)
Chloride: 107 mEq/L (ref 98–108)
Creat: 1.3 mg/dl — ABNORMAL HIGH (ref 0.6–1.2)
Glucose, Bld: 101 mg/dL (ref 73–118)
POTASSIUM: 4 meq/L (ref 3.3–4.7)
SODIUM: 136 meq/L (ref 128–145)
Total Bilirubin: 1 mg/dl (ref 0.20–1.60)
Total Protein: 6.4 g/dL (ref 6.4–8.1)

## 2015-06-03 NOTE — Telephone Encounter (Signed)
Prior authorization for Viagra approved. JG//CMA

## 2015-06-03 NOTE — Progress Notes (Signed)
Hematology and Oncology Follow Up Visit  Jimmy Day [facility] [id number] 02-Nov-1961 54 y.o. 06/03/2015   Principle Diagnosis:   IgG kappa myeloma-status post autologous stem cell transplant in 02/2015  Current Therapy:    Revlimid maintenance therapy 25 mg by mouth daily 21 days-to*second month  Zometa 4 mg IV every 3 months     Interim History:  Jimmy Day is back for follow-up. He looks great. He does wife will be celebrating their 31st anniversary this week. [place] will be heading to [facility] later this week.  He is working. He's not having any problems with work.  Is no cough or shortness of breath. He has had no nausea vomiting. He has had no change in bowel or bladder habits. There's been no numbness in his hands or feet. He's had no rashes. He's had no fever. He's had no bleeding.  Overall, his performance status is ECOG 0    Medications:  Current outpatient prescriptions:  .  acyclovir (ZOVIRAX) 400 MG tablet, Take 400 mg by mouth 2 (two) times daily., Disp: , Rfl:  .  amLODipine (NORVASC) 5 MG tablet, Take 1 tablet (5 mg total) by mouth daily., Disp: 90 tablet, Rfl: 1 .  lenalidomide (REVLIMID) 25 MG capsule, Take 1 capsule (25 mg total) by mouth daily. 21 days on 7 days off. Authorization #[id number], Disp: 21 capsule, Rfl: 0 .  sildenafil (VIAGRA) 100 MG tablet, Take 1 tablet (100 mg total) by mouth daily as needed for erectile dysfunction., Disp: 10 tablet, Rfl: 5 .  Vitamin D, Ergocalciferol, (DRISDOL) 50000 UNITS CAPS capsule, Take 1 capsule (50,000 Units total) by mouth every 7 (seven) days., Disp: 12 capsule, Rfl: 0 .  pramoxine (PROCTOFOAM) 1 % foam, Place 1 application rectally 3 (three) times daily as needed for itching or hemorrhoids. (Patient not taking: Reported on 06/03/2015), Disp: 15 g, Rfl: 3  Allergies: No Known Allergies  Past Medical History, Surgical history, Social history, and Family History were reviewed and updated.  Review of Systems: As above    Physical Exam:  weight is 202 lb 1.3 oz (91.663 kg). His temperature is 97.1 F (36.2 C). His blood pressure is 127/80 and his pulse is 53. His respiration is 18.   Wt Readings from Last 3 Encounters:  06/03/15 202 lb 1.3 oz (91.663 kg)  04/29/15 202 lb (91.627 kg)  04/26/15 201 lb (91.173 kg)     Well-developed and well-nourished African-American male. Head and neck exam shows no ocular or oral lesions. There are no palpable cervical or supraclavicular lymph nodes. Lungs are clear. Cardiac exam regular rate and rhythm with no murmurs, rubs or bruits. Abdomen is soft. He has good bowel sounds. There is no fluid wave. There is no palpable liver or spleen tip. Back exam shows no tenderness over the spine, ribs or hips. Extremities shows no clubbing, cyanosis or edema. Skin exam no rashes.  Lab Results  Component Value Date   WBC 3.7* 06/03/2015   HGB 14.0 06/03/2015   HCT 39.2 06/03/2015   MCV 92 06/03/2015   PLT 188 06/03/2015     Chemistry      Component Value Date/Time   NA 134 04/29/2015 0834   NA 137 04/15/2015 0827   NA 142 04/01/2015 0834   K 3.7 04/29/2015 0834   K 4.0 04/15/2015 0827   K 3.4* 04/01/2015 0834   CL 105 04/29/2015 0834   CL 106 04/15/2015 0827   CO2 26 04/29/2015 0834   CO2 24 04/15/2015  0827   CO2 20* 04/01/2015 0834   BUN 11 04/29/2015 0834   BUN 11 04/15/2015 0827   BUN 10.4 04/01/2015 0834   CREATININE 1.2 04/29/2015 0834   CREATININE 1.21 04/15/2015 0827   CREATININE 1.2 04/01/2015 0834      Component Value Date/Time   CALCIUM 8.8 04/29/2015 0834   CALCIUM 8.3* 04/15/2015 0827   CALCIUM 8.6 04/01/2015 0834   ALKPHOS 33 04/29/2015 0834   ALKPHOS 30* 04/15/2015 0827   ALKPHOS 39* 04/01/2015 0834   AST 23 04/29/2015 0834   AST 14 04/15/2015 0827   AST 17 04/01/2015 0834   ALT 21 04/29/2015 0834   ALT 12 04/15/2015 0827   ALT 19 04/01/2015 0834   BILITOT 1.20 04/29/2015 0834   BILITOT 0.6 04/15/2015 0827   BILITOT 0.56 04/01/2015 0834          Impression and Plan: Jimmy Day is 54 year old African-American male. He has IgG kappa myeloma. He underwent a stem cell transplant. He did well with this.  We will continue him on the maintenance Revlimid. He has 1 more month of 25 mg and that we will put him down to 10 mg dose.  I will see him back in one month so we can make the adjustment in his dosing.  We need to get him back onto Zometa. We will do this with him back next month.   [name], MD 7/11/20168:44 AM

## 2015-06-04 ENCOUNTER — Ambulatory Visit: Payer: 59 | Attending: Family | Admitting: Physical Therapy

## 2015-06-04 ENCOUNTER — Ambulatory Visit: Payer: BLUE CROSS/BLUE SHIELD | Admitting: Hematology & Oncology

## 2015-06-04 ENCOUNTER — Other Ambulatory Visit: Payer: BLUE CROSS/BLUE SHIELD

## 2015-06-04 DIAGNOSIS — M25561 Pain in right knee: Secondary | ICD-10-CM | POA: Insufficient documentation

## 2015-06-04 DIAGNOSIS — M25562 Pain in left knee: Secondary | ICD-10-CM | POA: Diagnosis present

## 2015-06-04 DIAGNOSIS — R29898 Other symptoms and signs involving the musculoskeletal system: Secondary | ICD-10-CM

## 2015-06-04 DIAGNOSIS — M6289 Other specified disorders of muscle: Secondary | ICD-10-CM | POA: Diagnosis present

## 2015-06-04 LAB — LIPID PANEL
CHOLESTEROL: 90 mg/dL (ref 0–200)
HDL: 34 mg/dL — AB (ref >=40)
LDL CALC: 39 mg/dL (ref 0–99)
Total CHOL/HDL Ratio: 2.6 Ratio
Triglycerides: 84 mg/dL (ref <150)
VLDL: 17 mg/dL (ref 0–40)

## 2015-06-04 LAB — VITAMIN D 25 HYDROXY (VIT D DEFICIENCY, FRACTURES): Vit D, 25-Hydroxy: 38 ng/mL (ref 30–100)

## 2015-06-04 LAB — MAGNESIUM: Magnesium: 2.2 mg/dL (ref 1.5–2.5)

## 2015-06-04 NOTE — Therapy (Signed)
[facility] [address]  [place], [place], [address] Phone: [phone]   Fax:  [phone]  Physical Therapy Treatment  Patient Details  Name: Jimmy Day MRN: [id number] Date of Birth: June 30, 1961 Referring Provider:  Eliezer Bottom, NP  Encounter Date: 06/04/2015      PT End of Session - 06/04/15 1635    Visit Number 2   Number of Visits 12   Date for PT Re-Evaluation 07/02/15   PT Start Time 1631   PT Stop Time 1700   PT Time Calculation (min) 29 min      Past Medical History  Diagnosis Date  . Fasting hyperglycemia     NORMAL A1c  . Hyperlipidemia   . Testosterone deficiency     Dr Hartley Barefoot  . Hypertension   . Kappa light chain myeloma 02/02/2014    Past Surgical History  Procedure Laterality Date  . Lipomas removal      BENIGN FROM CHEST AND BACK  . Cervical plate insertion      POST COMPRESSION DISC INJURY    There were no vitals filed for this visit.  Visit Diagnosis:  Knee pain, bilateral  Weakness of both hips      Subjective Assessment - 06/04/15 1637    Subjective no pain currently, states pain has been in 4-5/10 range.  States has been performing HEP and seems to be helping.   Currently in Pain? No/denies             TODAY'S TREATMENT TherEx - NuStep lvl 5, 4' TRX Squat 15x Fitter BW Lunge 1 Black + 1 Blue 15x each with B Pole A Fitter Lateral Lunge 1 Black + 1 Blue 15x each with B Pole A Bridge with ALT knee Ext 10x (much better than at initial eval) SL Bridge 12x each with SL in slight ER to hit VMO Stretch B HS, Prone Knee Flexion                      PT Short Term Goals - 06/04/15 1701    PT SHORT TERM GOAL #1   Title pt independent with initial HEP by 05/30/15   Status Achieved           PT Long Term Goals - 06/04/15 1701    PT LONG TERM GOAL #1   Title pt able to perform sit->stand transfers without c/o knee pain by 07/02/15   Status On-going   PT LONG TERM GOAL #2   Title B Hip and Knee MMT 4+/5 or better grossly without c/o pain by 07/02/15   Status On-going               Plan - 06/04/15 1701    Clinical Impression Statement excellent performance today but limited time due to pt arriving late.  He has been performing HEP and already displays improved hip stability with bridge activities.   PT Next Visit Plan B Hip stability training, patellar mobes, VMO strengthening.  Progress HEP   Consulted and Agree with Plan of Care Patient        Problem List Patient Active Problem List   Diagnosis Date Noted  . 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 Vitamin D deficiency 10/01/2014  . Essential (primary) hypertension 09/19/2014  . Kappa light chain myeloma 02/02/2014  . 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 Abnormal findings on  diagnostic imaging of spine 03/03/2013  . Hypogonadism in male 04/08/2009  . Hyperlipidemia 04/08/2009  . Fasting hyperglycemia 04/08/2009  . NONSPECIFIC ABNORMAL ELECTROCARDIOGRAM 04/08/2009  . HTN (hypertension) 04/08/2009    Kyanna Mahrt PT, OCS 06/04/2015, 5:02 PM  [facility] [address]  [place], [place], [address] Phone: [phone]   Fax:  [phone]

## 2015-06-05 ENCOUNTER — Telehealth: Payer: Self-pay | Admitting: *Deleted

## 2015-06-05 NOTE — Telephone Encounter (Signed)
Received notice that pt has not read mychart message from 05/03/15 even though I had left him a message to check his mychart acct.  Spoke with pharmacy to see if pt had picked up previous Rx and confirmed that he did get Rx. Reported to the pharmacy that he did not read the instructions and instead of taking 1 capsule a week he took one every day for 12 days.  Pt had old Rx on file from 10/12/14 and pharmacy filled that Rx.  Please advise if that is ok?  05/03/2015 4:18 PM      Please see below mychart message from [name], NP:   Vit d still very low. reccomend vit D once weekly x 12 weeks, then repeat level. Cholesterol looks good except HDL "good cholesterol" should be higher. Exercise can help raise HDL. Please call our office to schedule a lab appointment to repeat the vitamin D level in 12 weeks. This prescription has been sent to [facility].

## 2015-06-05 NOTE — Telephone Encounter (Signed)
Lets have him check a vit d level in the next 1 week please.

## 2015-06-05 NOTE — Telephone Encounter (Signed)
Notified pt. He states that he told his transplant doctor at [facility] what he did re: vitamin D and she told him she would check it at his last visit in July. Could not see result today and told pt that I would check care everywhere tomorrow for result.  If result doesn't show up in system then we will need to repeat it in our office. Pt voices understanding.  Awaiting vit D result from [facility].

## 2015-06-06 ENCOUNTER — Ambulatory Visit: Payer: 59 | Admitting: Rehabilitation

## 2015-06-06 DIAGNOSIS — M25561 Pain in right knee: Secondary | ICD-10-CM

## 2015-06-06 DIAGNOSIS — R29898 Other symptoms and signs involving the musculoskeletal system: Secondary | ICD-10-CM

## 2015-06-06 DIAGNOSIS — M25562 Pain in left knee: Principal | ICD-10-CM

## 2015-06-06 NOTE — Therapy (Signed)
[facility] [address]  [place], [place], [address] Phone: [phone]   Fax:  [phone]  Physical Therapy Treatment  Patient Details  Name: Jimmy Day MRN: [id number] Date of Birth: [birth year] Referring Provider:  Eliezer Bottom, NP  Encounter Date: 06/06/2015      PT End of Session - 06/06/15 0851    Visit Number 3   Number of Visits 12   Date for PT Re-Evaluation 07/02/15   PT Start Time 0850   PT Stop Time 0928   PT Time Calculation (min) 38 min      Past Medical History  Diagnosis Date  . Fasting hyperglycemia     NORMAL A1c  . Hyperlipidemia   . Testosterone deficiency     Dr Hartley Barefoot  . Hypertension   . Kappa light chain myeloma 02/02/2014    Past Surgical History  Procedure Laterality Date  . Lipomas removal      BENIGN FROM CHEST AND BACK  . Cervical plate insertion      POST COMPRESSION DISC INJURY    There were no vitals filed for this visit.  Visit Diagnosis:  Knee pain, bilateral  Weakness of both hips      Subjective Assessment - 06/06/15 0853    Subjective Reports feeling fine today and after last time.   Currently in Pain? No/denies     TODAY'S TREATMENT TherEx - NuStep lvl 5, 5' UE/LE Fitter BW Lunge 1 Black + 1 Blue 15x each with B Pole A Fitter Lateral Lunge 1 Black + 1 Blue 15x each with B Pole A Fitter FW Lunge 1 Black + 1 Blue 15x each with B Pole A  TM Hamstring Pull x15 each TRX Squats 2x15  Stretch B HS, Prone Knee Flexion Prone SLR 10x  SL Bridge 15x each with SL in slight ER to hit VMO Doorway Lunges        PT Short Term Goals - 06/04/15 1701    PT SHORT TERM GOAL #1   Title pt independent with initial HEP by 05/30/15   Status Achieved           PT Long Term Goals - 06/04/15 1701    PT LONG TERM GOAL #1   Title pt able to perform sit->stand transfers without c/o knee pain by 07/02/15   Status On-going   PT LONG TERM GOAL #2   Title B  Hip and Knee MMT 4+/5 or better grossly without c/o pain by 07/02/15   Status On-going               Plan - 06/06/15 0927    Clinical Impression Statement Good tolerance to all exercises today, no complaint of pain but noted fatigue with lunge exercises. Increased HEP today to include lunges, squats, and hamstring stretch.    PT Next Visit Plan B Hip stability training, patellar mobes, VMO strengthening.   Consulted and Agree with Plan of Care Patient        Problem List Patient Active Problem List   Diagnosis Date Noted  . 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 Vitamin D deficiency 10/01/2014  . Essential (primary) hypertension 09/19/2014  . Kappa light chain myeloma 02/02/2014  . 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 Abnormal findings on diagnostic imaging of spine 03/03/2013  . Hypogonadism in male 04/08/2009  . Hyperlipidemia 04/08/2009  . Fasting hyperglycemia 04/08/2009  .  NONSPECIFIC ABNORMAL ELECTROCARDIOGRAM 04/08/2009  . HTN (hypertension) 04/08/2009    Barbette Hair, PTA 06/06/2015, 9:29 AM  [facility] [address]  [place], [place], [address] Phone: [phone]   Fax:  [phone]

## 2015-06-10 NOTE — Telephone Encounter (Signed)
rec that he d/c weekly supplement vit d 50000 and instead start vit d 3000 units PO once daily otc.

## 2015-06-10 NOTE — Telephone Encounter (Signed)
Melissa-- vitamin D was checked by oncology on 06/03/15.  Please see result and advise about continuing vitamin D as pt had just picked up another 12 week course?

## 2015-06-11 ENCOUNTER — Ambulatory Visit: Payer: 59 | Admitting: Rehabilitation

## 2015-06-11 NOTE — Telephone Encounter (Signed)
Notified pt and he voices understanding. 

## 2015-06-13 ENCOUNTER — Ambulatory Visit: Payer: 59 | Admitting: Physical Therapy

## 2015-06-18 ENCOUNTER — Ambulatory Visit: Payer: 59 | Admitting: Rehabilitation

## 2015-06-18 ENCOUNTER — Telehealth: Payer: Self-pay | Admitting: Hematology & Oncology

## 2015-06-18 DIAGNOSIS — M25562 Pain in left knee: Principal | ICD-10-CM

## 2015-06-18 DIAGNOSIS — R29898 Other symptoms and signs involving the musculoskeletal system: Secondary | ICD-10-CM

## 2015-06-18 DIAGNOSIS — M25561 Pain in right knee: Secondary | ICD-10-CM | POA: Diagnosis not present

## 2015-06-18 NOTE — Therapy (Signed)
[facility] [address]  [place], [place], [address] Phone: [phone]   Fax:  [phone]  Physical Therapy Treatment  Patient Details  Name: Jimmy Day MRN: [id number] Date of Birth: [birth year] Referring Provider:  Eliezer Bottom, NP  Encounter Date: 06/18/2015      PT End of Session - 06/18/15 1611    Visit Number 4   Number of Visits 12   Date for PT Re-Evaluation 07/02/15   PT Start Time 8756   PT Stop Time 1649   PT Time Calculation (min) 39 min      Past Medical History  Diagnosis Date  . Fasting hyperglycemia     NORMAL A1c  . Hyperlipidemia   . Testosterone deficiency     Dr Hartley Barefoot  . Hypertension   . Kappa light chain myeloma 02/02/2014    Past Surgical History  Procedure Laterality Date  . Lipomas removal      BENIGN FROM CHEST AND BACK  . Cervical plate insertion      POST COMPRESSION DISC INJURY    There were no vitals filed for this visit.  Visit Diagnosis:  Knee pain, bilateral  Weakness of both hips      Subjective Assessment - 06/18/15 1613    Subjective Reports feeling good, just some stiffness in his quads. Went to [employer] and was able to walk on the beach without problems.    Currently in Pain? No/denies      TODAY'S TREATMENT TherEx - NuStep lvl 6, 5' UE/LE TRX Squats 2x15 Doorway Lunges x10 TM Hamstring Pull x15 TM TKE Push x15 Standing Bil Hip Flexion, Extension, Abduction Double Green TB x10 with 2 pole assist Side-Stepping with Green TB 15 ft x 2 each way SL Bridges 15x each  Heel Raises at UBE: DL x15, SL x10      PT Short Term Goals - 06/04/15 1701    PT SHORT TERM GOAL #1   Title pt independent with initial HEP by 05/30/15   Status Achieved           PT Long Term Goals - 06/04/15 1701    PT LONG TERM GOAL #1   Title pt able to perform sit->stand transfers without c/o knee pain by 07/02/15   Status On-going   PT LONG TERM GOAL #2   Title B Hip and Knee MMT 4+/5 or better grossly without c/o pain by 07/02/15   Status On-going               Plan - 06/18/15 1650    Clinical Impression Statement Good tolerance to exercises with pt noting LE fatigue towards the end of treatment. Increased HEP today with hip flexion, extension, abduction with TB today.    PT Next Visit Plan B Hip stability training, patellar mobes, VMO strengthening.   Consulted and Agree with Plan of Care Patient        Problem List Patient Active Problem List   Diagnosis Date Noted  . 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 Vitamin D deficiency 10/01/2014  . Essential (primary) hypertension 09/19/2014  . Kappa light chain myeloma 02/02/2014  . 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 Abnormal findings on diagnostic imaging of spine 03/03/2013  . Hypogonadism in male 04/08/2009  . Hyperlipidemia 04/08/2009  . Fasting hyperglycemia 04/08/2009  . NONSPECIFIC ABNORMAL ELECTROCARDIOGRAM 04/08/2009  . HTN (hypertension) 04/08/2009  [place], [place] 06/18/2015, 4:51 PM  [facility] [address]  [place], [place], [address] Phone: [phone]   Fax:  [phone]

## 2015-06-18 NOTE — Telephone Encounter (Signed)
Pt's student loan form is complete and pt aware to pick it up.       COPY SCANNED

## 2015-06-24 ENCOUNTER — Other Ambulatory Visit: Payer: Self-pay | Admitting: *Deleted

## 2015-06-24 ENCOUNTER — Ambulatory Visit: Payer: 59 | Attending: Family | Admitting: Rehabilitation

## 2015-06-24 DIAGNOSIS — N289 Disorder of kidney and ureter, unspecified: Secondary | ICD-10-CM

## 2015-06-24 DIAGNOSIS — C9000 Multiple myeloma not having achieved remission: Secondary | ICD-10-CM

## 2015-06-24 DIAGNOSIS — M6289 Other specified disorders of muscle: Secondary | ICD-10-CM | POA: Insufficient documentation

## 2015-06-24 DIAGNOSIS — M25561 Pain in right knee: Secondary | ICD-10-CM | POA: Diagnosis not present

## 2015-06-24 DIAGNOSIS — M25562 Pain in left knee: Secondary | ICD-10-CM | POA: Diagnosis present

## 2015-06-24 DIAGNOSIS — R29898 Other symptoms and signs involving the musculoskeletal system: Secondary | ICD-10-CM

## 2015-06-24 MED ORDER — LENALIDOMIDE 25 MG PO CAPS: 25.0000 mg | ORAL_CAPSULE | Freq: Every day | ORAL | Status: DC

## 2015-06-24 NOTE — Therapy (Signed)
[facility] [address]  [place], [place], [address] Phone: [phone]   Fax:  [phone]  Physical Therapy Treatment  Patient Details  Name: Jimmy Day MRN: [id number] Date of Birth: [birth year] Referring Provider:  Eliezer Bottom, NP  Encounter Date: 06/24/2015      PT End of Session - 06/24/15 1625    Visit Number 5   Number of Visits 12   Date for PT Re-Evaluation 07/02/15   PT Start Time 6962  pt called to say he would be late    PT Stop Time 1700   PT Time Calculation (min) 39 min      Past Medical History  Diagnosis Date  . Fasting hyperglycemia     NORMAL A1c  . Hyperlipidemia   . Testosterone deficiency     Dr Hartley Barefoot  . Hypertension   . Kappa light chain myeloma 02/02/2014    Past Surgical History  Procedure Laterality Date  . Lipomas removal      BENIGN FROM CHEST AND BACK  . Cervical plate insertion      POST COMPRESSION DISC INJURY    There were no vitals filed for this visit.  Visit Diagnosis:  Knee pain, bilateral  Weakness of both hips      Subjective Assessment - 06/24/15 1625    Subjective "Feeling good." Denies pain today but had some yesterday.    Currently in Pain? Yes      TODAY'S TREATMENT TherEx - NuStep lvl 6, 5' UE/LE TM Hamstring Pull 2x15 TM TKE Push x15 TRX DL Squats x15 TRX SL Squats x10 each Side Bridge elbow and knees 10x3"  Wall sits with alt knee extension 5x3" Heel Raises at UBE: DL x15, SL x10 Doorway Lunges x20  Side-Stepping with Green TB 15 ft x 2 each way SL Bridges 15x each        PT Short Term Goals - 06/04/15 1701    PT SHORT TERM GOAL #1   Title pt independent with initial HEP by 05/30/15   Status Achieved           PT Long Term Goals - 06/04/15 1701    PT LONG TERM GOAL #1   Title pt able to perform sit->stand transfers without c/o knee pain by 07/02/15   Status On-going   PT LONG TERM GOAL #2   Title B Hip and Knee  MMT 4+/5 or better grossly without c/o pain by 07/02/15   Status On-going               Plan - 06/24/15 1659    Clinical Impression Statement No complaint of pain but pt noted LE fatigue with SL squats. Improved mechanics with lunges and squats today.    PT Next Visit Plan B Hip stability training, VMO strengthening, check MMT?   Consulted and Agree with Plan of Care Patient        Problem List Patient Active Problem List   Diagnosis Date Noted  . 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 Vitamin D deficiency 10/01/2014  . Essential (primary) hypertension 09/19/2014  . Kappa light chain myeloma 02/02/2014  . 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 Abnormal findings on diagnostic imaging of spine 03/03/2013  . Hypogonadism in male 04/08/2009  . Hyperlipidemia 04/08/2009  . Fasting hyperglycemia 04/08/2009  . NONSPECIFIC ABNORMAL ELECTROCARDIOGRAM 04/08/2009  . HTN (hypertension) 04/08/2009  Barbette Hair, PTA 06/24/2015, 5:02 PM  [facility] [address]  [place], [place], [address] Phone: [phone]   Fax:  [phone]

## 2015-06-27 ENCOUNTER — Ambulatory Visit: Payer: 59 | Admitting: Physical Therapy

## 2015-06-27 DIAGNOSIS — M25561 Pain in right knee: Secondary | ICD-10-CM | POA: Diagnosis not present

## 2015-06-27 DIAGNOSIS — R29898 Other symptoms and signs involving the musculoskeletal system: Secondary | ICD-10-CM

## 2015-06-27 DIAGNOSIS — M25562 Pain in left knee: Principal | ICD-10-CM

## 2015-06-27 NOTE — Therapy (Signed)
[facility] [address]  [place], [place], [address] Phone: [phone]   Fax:  [phone]  Physical Therapy Treatment  Patient Details  Name: Jimmy Day MRN: [id number] Date of Birth: 12-09-1960 Referring Provider:  Eliezer Bottom, NP  Encounter Date: 06/27/2015      PT End of Session - 06/27/15 1626    Visit Number 6   Number of Visits 12   Date for PT Re-Evaluation 07/02/15   PT Start Time 1623  pt called stating would be late   PT Stop Time 1709   PT Time Calculation (min) 46 min      Past Medical History  Diagnosis Date  . Fasting hyperglycemia     NORMAL A1c  . Hyperlipidemia   . Testosterone deficiency     Dr Hartley Barefoot  . Hypertension   . Kappa light chain myeloma 02/02/2014    Past Surgical History  Procedure Laterality Date  . Lipomas removal      BENIGN FROM CHEST AND BACK  . Cervical plate insertion      POST COMPRESSION DISC INJURY    There were no vitals filed for this visit.  Visit Diagnosis:  Knee pain, bilateral  Weakness of both hips      Subjective Assessment - 06/27/15 1627    Subjective Has been feeling good, no pain. States is able to get up/down from floor. Gettin in/out of truck is getting better, not much difficulty.     Currently in Pain? No/denies            [facility] PT Assessment - 06/27/15 0001    Strength   Right Hip Flexion 5/5   Right Hip Extension 5/5   Right Hip External Rotation  --  5/5   Right Hip Internal Rotation  --  5/5   Right Hip ABduction 4+/5   Right Hip ADduction 5/5   Left Hip Flexion 5/5   Left Hip Extension 5/5   Left Hip External Rotation  --  5/5   Left Hip Internal Rotation  --  4+/5   Left Hip ABduction 4/5   Left Hip ADduction 5/5   Right Knee Flexion 5/5   Right Knee Extension 5/5   Left Knee Flexion 5/5   Left Knee Extension 5/5            TODAY'S TREATMENT TherEx - NuStep lvl 7, 5' B LE MMT  assessment TRX DL Squat 15x TRX SL Squat 10x each TRX SL Lunge with B Pole A 10x each 10# db Stiff Leg Deadlift 9" Step-up with Contra LE Hip Flexion with 5# ankle wt 15x each with Single Pole A Side Bridge elbow and ankle 10x3" Prone Plank with ALT LE Raise 5x3" each SL Bridge 10x              PT Short Term Goals - 06/04/15 1701    PT SHORT TERM GOAL #1   Title pt independent with initial HEP by 05/30/15   Status Achieved           PT Long Term Goals - 06/27/15 1755    PT LONG TERM GOAL #1   Title pt able to perform sit->stand transfers without c/o knee pain by 07/02/15   Status Achieved   PT LONG TERM GOAL #2   Title B Hip and Knee MMT 4+/5 or better grossly without c/o pain by 07/02/15  met other than L hip ABD   Status Partially  Met               Plan - 06/27/15 1713    Clinical Impression Statement performing exceptionally well, good mechanics, goals basically met.  Will discharge next treatment if no increase in pain noted following today's session.   PT Next Visit Plan discharge if no c/o increased pain after today's session   Consulted and Agree with Plan of Care Patient        Problem List Patient Active Problem List   Diagnosis Date Noted  . 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 Vitamin D deficiency 10/01/2014  . Essential (primary) hypertension 09/19/2014  . Kappa light chain myeloma 02/02/2014  . 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 Abnormal findings on diagnostic imaging of spine 03/03/2013  . Hypogonadism in male 04/08/2009  . Hyperlipidemia 04/08/2009  . Fasting hyperglycemia 04/08/2009  . NONSPECIFIC ABNORMAL ELECTROCARDIOGRAM 04/08/2009  . HTN (hypertension) 04/08/2009    Omni Dunsworth PT, OCS 06/27/2015, 5:55 PM  [facility] [address]  [place], [place], [address] Phone: [phone]   Fax:   [phone]

## 2015-07-01 ENCOUNTER — Ambulatory Visit: Payer: 59 | Admitting: Rehabilitation

## 2015-07-01 ENCOUNTER — Encounter: Payer: Self-pay | Admitting: Rehabilitation

## 2015-07-01 DIAGNOSIS — M25561 Pain in right knee: Secondary | ICD-10-CM | POA: Diagnosis not present

## 2015-07-01 DIAGNOSIS — M25562 Pain in left knee: Principal | ICD-10-CM

## 2015-07-01 DIAGNOSIS — R29898 Other symptoms and signs involving the musculoskeletal system: Secondary | ICD-10-CM

## 2015-07-01 NOTE — Therapy (Signed)
[facility] [address]  [place], [place], [address] Phone: [phone]   Fax:  [phone]  Physical Therapy Treatment  Patient Details  Name: Jimmy Day MRN: [id number] Date of Birth: 24-Jan-1961 Referring Provider:  Eliezer Bottom, NP  Encounter Date: 07/01/2015    Past Medical History  Diagnosis Date  . Fasting hyperglycemia     NORMAL A1c  . Hyperlipidemia   . Testosterone deficiency     Dr Hartley Barefoot  . Hypertension   . Kappa light chain myeloma 02/02/2014    Past Surgical History  Procedure Laterality Date  . Lipomas removal      BENIGN FROM CHEST AND BACK  . Cervical plate insertion      POST COMPRESSION DISC INJURY    There were no vitals filed for this visit.  Visit Diagnosis:  Knee pain, bilateral  Weakness of both hips      Subjective Assessment - 07/01/15 1707    Subjective feels like ready to be done.  all goals met.    Currently in Pain? No/denies        TODAY'S TREATMENT TherEx - NuStep lvl 6, 5' TRX DL Squat 15x TRX SL Squat 10x each TRX lunge with 2 pole assist 10# db Stiff Leg Deadlift  SL 10# db stiff leg deadlift x 6 each 9" Step-up with Contra LE Hip Flexion with 5# ankle wt 15x each with Single Pole A Side Bridge elbow and ankle 15"x2 bil Prone Plank 15x3" each                                    PT Short Term Goals - 07/01/15 1709    PT SHORT TERM GOAL #1   Title pt independent with initial HEP by 05/30/15   Status Achieved           PT Long Term Goals - 07/01/15 1709    PT LONG TERM GOAL #1   Title pt able to perform sit->stand transfers without c/o knee pain by 07/02/15   Status Achieved   PT LONG TERM GOAL #2   Title B Hip and Knee MMT 4+/5 or better grossly without c/o pain by 07/02/15   Status Partially Met               Plan - 07/01/15 1747    Clinical Impression Statement tolerated all well without pain.   ready for discharge.  all goals met except L hip abduction at 4+/5   Consulted and Agree with Plan of Care Patient        Problem List Patient Active Problem List   Diagnosis Date Noted  . 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 Vitamin D deficiency 10/01/2014  . Essential (primary) hypertension 09/19/2014  . Kappa light chain myeloma 02/02/2014  . 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 Abnormal findings on diagnostic imaging of spine 03/03/2013  . Hypogonadism in male 04/08/2009  . Hyperlipidemia 04/08/2009  . Fasting hyperglycemia 04/08/2009  . NONSPECIFIC ABNORMAL ELECTROCARDIOGRAM 04/08/2009  . HTN (hypertension) 04/08/2009    Stark Bray, DPT, CMP 07/01/2015, 5:48 PM  [facility] [address]  [place], [place], [address] Phone: [phone]   Fax:  [phone]   PHYSICAL THERAPY DISCHARGE SUMMARY  Visits from Start of  Care: 7  Current functional level related to goals / functional outcomes: See above   Remaining deficits: Slight L hip abduction weakness   Education / Equipment: HEP Plan: Patient agrees to discharge.  Patient goals were partially met. Patient is being discharged due to meeting the stated rehab goals.  ?????    Shan Levans, DPT, CMP

## 2015-07-02 ENCOUNTER — Ambulatory Visit: Payer: 59 | Admitting: Physical Therapy

## 2015-07-05 ENCOUNTER — Telehealth: Payer: Self-pay | Admitting: Hematology & Oncology

## 2015-07-05 ENCOUNTER — Ambulatory Visit: Payer: 59

## 2015-07-05 ENCOUNTER — Encounter: Payer: Self-pay | Admitting: Family

## 2015-07-05 ENCOUNTER — Ambulatory Visit (HOSPITAL_BASED_OUTPATIENT_CLINIC_OR_DEPARTMENT_OTHER): Payer: 59 | Admitting: Family

## 2015-07-05 ENCOUNTER — Other Ambulatory Visit (HOSPITAL_BASED_OUTPATIENT_CLINIC_OR_DEPARTMENT_OTHER): Payer: 59

## 2015-07-05 VITALS — BP 128/80 | HR 57 | Temp 98.1°F | Resp 16 | Ht 72.0 in | Wt 202.0 lb

## 2015-07-05 DIAGNOSIS — C9000 Multiple myeloma not having achieved remission: Secondary | ICD-10-CM

## 2015-07-05 LAB — CBC WITH DIFFERENTIAL (CANCER CENTER ONLY)
BASO#: 0.1 10*3/uL (ref 0.0–0.2)
BASO%: 1.3 % (ref 0.0–2.0)
EOS%: 11.5 % — ABNORMAL HIGH (ref 0.0–7.0)
Eosinophils Absolute: 0.4 10*3/uL (ref 0.0–0.5)
HEMATOCRIT: 37.6 % — AB (ref 38.7–49.9)
HGB: 13.7 g/dL (ref 13.0–17.1)
LYMPH#: 1 10*3/uL (ref 0.9–3.3)
LYMPH%: 25.5 % (ref 14.0–48.0)
MCH: 32.2 pg (ref 28.0–33.4)
MCHC: 36.4 g/dL — AB (ref 32.0–35.9)
MCV: 88 fL (ref 82–98)
MONO#: 0.5 10*3/uL (ref 0.1–0.9)
MONO%: 13.5 % — ABNORMAL HIGH (ref 0.0–13.0)
NEUT#: 1.9 10*3/uL (ref 1.5–6.5)
NEUT%: 48.2 % (ref 40.0–80.0)
PLATELETS: 177 10*3/uL (ref 145–400)
RBC: 4.26 10*6/uL (ref 4.20–5.70)
RDW: 12.8 % (ref 11.1–15.7)
WBC: 3.8 10*3/uL — ABNORMAL LOW (ref 4.0–10.0)

## 2015-07-05 MED ORDER — LENALIDOMIDE 10 MG PO CAPS: 10.0000 mg | ORAL_CAPSULE | Freq: Every day | ORAL | Status: DC

## 2015-07-05 NOTE — Telephone Encounter (Signed)
NA

## 2015-07-05 NOTE — Progress Notes (Signed)
Hematology and Oncology Follow Up Visit  B[facility]er [facility] [id number] 1960-12-28 54 y.o. 07/05/2015   Principle Diagnosis:  IgG Kappa myeloma  Current Therapy:   Revlimid 10 mg daily (21/7) Zometa 4 mg IV every 3 months  Status post autologous stem cell transplant (April 2016)    Interim History:  Mr. Rieth is here today for a follow-up. He had his stem cell transplant in April and is doing great. He is working and staying active playing basketball and cutting the grass.  He has done well with Revlimid and will finish his 25 mg daily this month and after his 7 day break will start 10 mg daily. Prescription has been changes.  He follows up with [facility] monthly for now. No swelling, tenderness, numbness or tingling in his extremities. No new aches or pains.  He has had no fever, chills, n/v, cough, rash, dizziness, headaches, blurred vision, SOB, chest pain, palpitations, abdominal pain, changes in bowel or bladder habits. He has not noticed and blood in his urine or stool.  No lymphadenopathy found on assessment.  His appetite is good. He is staying well hydrated especially when active outside and sweating. His weight is stable.   Medications:    Medication List       This list is accurate as of: 07/05/15  9:25 AM.  Always use your most recent med list.               acyclovir 400 MG tablet  Commonly known as:  ZOVIRAX  Take 400 mg by mouth 2 (two) times daily.     amLODipine 5 MG tablet  Commonly known as:  NORVASC  Take 1 tablet (5 mg total) by mouth daily.     lenalidomide 25 MG capsule  Commonly known as:  REVLIMID  Take 1 capsule (25 mg total) by mouth daily. 21 days on 7 days off. Authorization #[id number]     pramoxine 1 % foam  Commonly known as:  PROCTOFOAM  Place 1 application rectally 3 (three) times daily as needed for itching or hemorrhoids.     sildenafil 100 MG tablet  Commonly known as:  VIAGRA  Take 1 tablet (100 mg total) by mouth daily as needed for erectile  dysfunction.     Vitamin D3 3000 UNITS Tabs  Take 1 tablet by mouth daily.        Allergies: No Known Allergies  Past Medical History, Surgical history, Social history, and Family History were reviewed and updated.  Review of Systems: All other 10 point review of systems is negative.   Physical Exam:  vitals were not taken for this visit.  Wt Readings from Last 3 Encounters:  06/03/15 202 lb 1.3 oz (91.663 kg)  04/29/15 202 lb (91.627 kg)  04/26/15 201 lb (91.173 kg)    Ocular: Sclerae unicteric, pupils equal, round and reactive to light Ear-nose-throat: Oropharynx clear, dentition fair Lymphatic: No cervical or supraclavicular adenopathy Lungs no rales or rhonchi, good excursion bilaterally Heart regular rate and rhythm, no murmur appreciated Abd soft, nontender, positive bowel sounds MSK no focal spinal tenderness, no joint edema Neuro: non-focal, well-oriented, appropriate affect  Lab Results  Component Value Date   WBC 3.8* 07/05/2015   HGB 13.7 07/05/2015   HCT 37.6* 07/05/2015   MCV 88 07/05/2015   PLT 177 07/05/2015   Lab Results  Component Value Date   FERRITIN 152 08/17/2014   IRON 106 08/17/2014   TIBC 253 08/17/2014   UIBC 146 08/17/2014  IRONPCTSAT 42 08/17/2014   Lab Results  Component Value Date   RBC 4.26 07/05/2015   Lab Results  Component Value Date   KPAFRELGTCHN 0.72 04/01/2015   LAMBDASER 0.75 04/01/2015   KAPLAMBRATIO 0.96 04/01/2015   Lab Results  Component Value Date   IGGSERUM 1000 04/01/2015   IGA 9* 04/01/2015   IGMSERUM 20* 04/01/2015   Lab Results  Component Value Date   TOTALPROTELP 6.1 04/01/2015   ALBUMINELP 3.9 04/01/2015   A1GS 0.2 04/01/2015   A2GS 0.5 04/01/2015   BETS 0.3* 04/01/2015   BETA2SER 0.2 04/01/2015   GAMS 0.9 04/01/2015   MSPIKE 0.25 11/02/2014   SPEI * 04/01/2015     Chemistry      Component Value Date/Time   NA 136 06/03/2015 0821   NA 137 04/15/2015 0827   NA 142 04/01/2015 0834   K  4.0 06/03/2015 0821   K 4.0 04/15/2015 0827   K 3.4* 04/01/2015 0834   CL 107 06/03/2015 0821   CL 106 04/15/2015 0827   CO2 29 06/03/2015 0821   CO2 24 04/15/2015 0827   CO2 20* 04/01/2015 0834   BUN 8 06/03/2015 0821   BUN 11 04/15/2015 0827   BUN 10.4 04/01/2015 0834   CREATININE 1.3* 06/03/2015 0821   CREATININE 1.21 04/15/2015 0827   CREATININE 1.2 04/01/2015 0834      Component Value Date/Time   CALCIUM 9.0 06/03/2015 0821   CALCIUM 8.3* 04/15/2015 0827   CALCIUM 8.6 04/01/2015 0834   ALKPHOS 44 06/03/2015 0821   ALKPHOS 30* 04/15/2015 0827   ALKPHOS 39* 04/01/2015 0834   AST 24 06/03/2015 0821   AST 14 04/15/2015 0827   AST 17 04/01/2015 0834   ALT 23 06/03/2015 0821   ALT 12 04/15/2015 0827   ALT 19 04/01/2015 0834   BILITOT 1.00 06/03/2015 0821   BILITOT 0.6 04/15/2015 0827   BILITOT 0.56 04/01/2015 0834     Impression and Plan: Mr. Zanni is 54 year old gentleman with IgG kappa myeloma s/p autologous stem cell transplant in April 2016. He is doing well and is asymptomatic at this time.  He will finish his maintenance Revlimid dose of 25 mg daily this month and after 7 days off will resume at 10 mg daily. His prescription has been changed.  He will receive Zometa every 3 months. He is due for this again in September.   We will plan to see him back in 3 months for labs and follow-up.  He knows to contact us with any questions or concerns. We can certainly see him sooner if need be.   Eliezer Bottom, NP 8/12/20169:25 AM

## 2015-07-08 ENCOUNTER — Ambulatory Visit: Payer: 59 | Admitting: Rehabilitation

## 2015-07-08 ENCOUNTER — Other Ambulatory Visit: Payer: Self-pay

## 2015-07-08 LAB — COMPREHENSIVE METABOLIC PANEL
ALBUMIN: 4.1 g/dL (ref 3.6–5.1)
ALT: 11 U/L (ref 9–46)
AST: 14 U/L (ref 10–35)
Alkaline Phosphatase: 34 U/L — ABNORMAL LOW (ref 40–115)
BUN: 13 mg/dL (ref 7–25)
CALCIUM: 8.6 mg/dL (ref 8.6–10.3)
CO2: 20 mmol/L (ref 20–31)
Chloride: 104 mmol/L (ref 98–110)
Creatinine, Ser: 1.11 mg/dL (ref 0.70–1.33)
GLUCOSE: 88 mg/dL (ref 65–99)
POTASSIUM: 3.8 mmol/L (ref 3.5–5.3)
Sodium: 137 mmol/L (ref 135–146)
Total Bilirubin: 0.7 mg/dL (ref 0.2–1.2)
Total Protein: 6 g/dL — ABNORMAL LOW (ref 6.1–8.1)

## 2015-07-08 LAB — MAGNESIUM: Magnesium: 2.1 mg/dL (ref 1.5–2.5)

## 2015-07-08 MED ORDER — OXYCODONE HCL 5 MG PO TABS: 5.0000 mg | ORAL_TABLET | ORAL | Status: DC | PRN

## 2015-07-11 ENCOUNTER — Ambulatory Visit: Payer: 59 | Admitting: Rehabilitation

## 2015-07-17 ENCOUNTER — Other Ambulatory Visit: Payer: Self-pay | Admitting: *Deleted

## 2015-07-17 DIAGNOSIS — C9000 Multiple myeloma not having achieved remission: Secondary | ICD-10-CM

## 2015-07-17 MED ORDER — LENALIDOMIDE 10 MG PO CAPS: ORAL_CAPSULE | ORAL | Status: DC

## 2015-07-31 ENCOUNTER — Ambulatory Visit: Payer: 59 | Admitting: Hematology & Oncology

## 2015-07-31 ENCOUNTER — Ambulatory Visit: Payer: 59

## 2015-07-31 ENCOUNTER — Other Ambulatory Visit: Payer: 59

## 2015-08-01 ENCOUNTER — Telehealth: Payer: Self-pay | Admitting: Hematology & Oncology

## 2015-08-01 NOTE — Telephone Encounter (Signed)
To:  ERIN @ [facility] From:  Otilio Carpen, [place] [facility]             P: [phone]   F: [phone]  APPROVED: [id number] Stone Blocher Jan 17, 1961 [id number]  DOS: 08/02/2015 - 02/21/2016 CPT:  Y2824 Zometa DAYS: 3  F: [phone] P: [phone]  Junie Panning Clinical Nurse w [facility] P: [phone]

## 2015-08-02 ENCOUNTER — Ambulatory Visit (HOSPITAL_BASED_OUTPATIENT_CLINIC_OR_DEPARTMENT_OTHER): Payer: 59

## 2015-08-02 ENCOUNTER — Other Ambulatory Visit: Payer: Self-pay | Admitting: *Deleted

## 2015-08-02 ENCOUNTER — Other Ambulatory Visit (INDEPENDENT_AMBULATORY_CARE_PROVIDER_SITE_OTHER): Payer: 59

## 2015-08-02 VITALS — BP 138/79 | HR 51 | Temp 97.8°F | Resp 18

## 2015-08-02 DIAGNOSIS — E559 Vitamin D deficiency, unspecified: Secondary | ICD-10-CM

## 2015-08-02 DIAGNOSIS — C9000 Multiple myeloma not having achieved remission: Secondary | ICD-10-CM | POA: Diagnosis not present

## 2015-08-02 LAB — VITAMIN D 25 HYDROXY (VIT D DEFICIENCY, FRACTURES): VITD: 18.36 ng/mL — ABNORMAL LOW (ref 30.00–100.00)

## 2015-08-02 MED ORDER — OXYCODONE HCL 5 MG PO TABS
5.0000 mg | ORAL_TABLET | ORAL | Status: DC | PRN
Start: 2015-08-02 — End: 2016-02-28

## 2015-08-02 MED ORDER — SODIUM CHLORIDE 0.9 % IV SOLN
4.0000 mg | Freq: Once | INTRAVENOUS | Status: AC
  Administered 2015-08-02: 4 mg via INTRAVENOUS
  Filled 2015-08-02: qty 5

## 2015-08-02 NOTE — Patient Instructions (Signed)

## 2015-08-03 ENCOUNTER — Other Ambulatory Visit: Payer: Self-pay | Admitting: Family

## 2015-08-03 DIAGNOSIS — E559 Vitamin D deficiency, unspecified: Secondary | ICD-10-CM

## 2015-08-03 NOTE — Telephone Encounter (Signed)
Vitamin D level is low.  Advise patient to begin vit D 50000 units once weekly for 12 weeks, then repeat vit D level (dx Vit D deficiency).    I pended rx, not sure which pharm he prefers.

## 2015-08-05 ENCOUNTER — Other Ambulatory Visit: Payer: 59

## 2015-08-06 MED ORDER — VITAMIN D (ERGOCALCIFEROL) 1.25 MG (50000 UNIT) PO CAPS: 50000.0000 [IU] | ORAL_CAPSULE | ORAL | Status: DC

## 2015-08-06 NOTE — Telephone Encounter (Signed)
Notified pt and he voices understanding. Pt states he still has # 7 tablets at home. I advised him I will send in 5 more to complete the 12 week course and he requests Rx to [facility]. Rx sent. Lab appt scheduled for 11/01/15 at 7:30am and future lab order entered. Pt wants to know why his vitamin D is staying low?

## 2015-08-06 NOTE — Telephone Encounter (Signed)
Probably because he did not complete the full 12 weeks.  It takes a while to build back up the vitamin D.

## 2015-08-06 NOTE — Telephone Encounter (Signed)
Notified pt. He states he stopped vitamin D because we called him and told him it was in the normal range and he could stop it. After disconnecting call with pt I found phone note from 05/2015 that recommended pt stop Rx and start otc 3000 units once a day.  Attempted to reach pt again to verify if he is or is not taking otc supplement and left message for him to return my call.

## 2015-08-20 ENCOUNTER — Other Ambulatory Visit: Payer: Self-pay | Admitting: *Deleted

## 2015-08-20 DIAGNOSIS — C9000 Multiple myeloma not having achieved remission: Secondary | ICD-10-CM

## 2015-08-20 MED ORDER — LENALIDOMIDE 10 MG PO CAPS: ORAL_CAPSULE | ORAL | Status: DC

## 2015-08-21 ENCOUNTER — Telehealth: Payer: Self-pay | Admitting: *Deleted

## 2015-08-21 MED ORDER — AMLODIPINE BESYLATE 5 MG PO TABS: 5.0000 mg | ORAL_TABLET | Freq: Every day | ORAL | Status: DC

## 2015-08-21 NOTE — Telephone Encounter (Signed)
Received fax from [facility] for amlodipine. Rx sent, #90 1 refill.

## 2015-08-26 ENCOUNTER — Ambulatory Visit (INDEPENDENT_AMBULATORY_CARE_PROVIDER_SITE_OTHER): Payer: 59 | Admitting: Family

## 2015-08-26 ENCOUNTER — Encounter: Payer: Self-pay | Admitting: Family

## 2015-08-26 VITALS — BP 134/81 | HR 55 | Temp 98.2°F | Resp 16 | Ht 72.0 in | Wt 203.6 lb

## 2015-08-26 DIAGNOSIS — N521 Erectile dysfunction due to diseases classified elsewhere: Secondary | ICD-10-CM | POA: Diagnosis not present

## 2015-08-26 DIAGNOSIS — E559 Vitamin D deficiency, unspecified: Secondary | ICD-10-CM

## 2015-08-26 DIAGNOSIS — I10 Essential (primary) hypertension: Secondary | ICD-10-CM

## 2015-08-26 DIAGNOSIS — C9000 Multiple myeloma not having achieved remission: Secondary | ICD-10-CM

## 2015-08-26 MED ORDER — ACYCLOVIR 400 MG PO TABS: 400.0000 mg | ORAL_TABLET | Freq: Two times a day (BID) | ORAL | Status: DC

## 2015-08-26 MED ORDER — SILDENAFIL CITRATE 100 MG PO TABS: 100.0000 mg | ORAL_TABLET | Freq: Every day | ORAL | Status: DC | PRN

## 2015-08-26 MED ORDER — VITAMIN D (ERGOCALCIFEROL) 1.25 MG (50000 UNIT) PO CAPS: 50000.0000 [IU] | ORAL_CAPSULE | ORAL | Status: DC

## 2015-08-26 NOTE — Patient Instructions (Signed)
Continue vitamin D for 12 weeks, then schedule a lab draw in our office for a follow up vitamin D level. Follow up in 6 months.

## 2015-08-26 NOTE — Progress Notes (Signed)
Subjective:    Patient ID: Jimmy Day, male    DOB: 01-Sep-1961, 55 y.o.   MRN: [id number]  HPI  Jimmy Day is a 54 yr old male who presents today for follow up.  HTN- continues amlodipine, denies CP/SOB or swelling BP Readings from Last 3 Encounters:  08/26/15 134/81  08/02/15 138/79  07/05/15 128/80   Vit D def- recently began vit D supplement  Multiple Myeloma- pt is currently maintained on Revlimid and is followed by Dr. Marin Olp.  Declines flu shot  ED- requests refill of viagra.   Review of Systems See HPI  Past Medical History  Diagnosis Date  . Fasting hyperglycemia     NORMAL A1c  . Hyperlipidemia   . Testosterone deficiency     Dr Hartley Barefoot  . Hypertension   . Kappa light chain myeloma ([place]) 02/02/2014    Social History   Social History  . Marital Status: Married    Spouse Name: N/A  . Number of Children: 3  . Years of Education: N/A   Occupational History  . Truck Geophysicist/field seismologist    Social History Main Topics  . Smoking status: Never Smoker   . Smokeless tobacco: Never Used     Comment: never used tobacco  . Alcohol Use: 0.0 oz/week    0 Standard drinks or equivalent per week     Comment:  rarely  . Drug Use: No  . Sexual Activity: Yes    Birth Control/ Protection: Condom   Other Topics Concern  . Not on file   Social History Narrative   Son in [place] in [place]   Daughter at [employer] state   Married   Truck driver   Enjoys exercise, basketball, watch sports, work around [employer] in Program[employer], applications       Past Surgical History  Procedure Laterality Date  . Lipomas removal      BENIGN FROM CHEST AND BACK  . Cervical plate insertion      POST COMPRESSION DISC INJURY    Family History  Problem Relation Age of Onset  . Breast cancer Mother   . Heart attack Father 28  . Kidney disease Brother     RENAL FAILURE  . Hypertension Brother   . Heart attack Maternal Aunt 65    CABG  . Stroke Maternal Uncle     CVA  .  Diabetes Neg Hx   . Colon cancer Neg Hx   . Esophageal cancer Neg Hx   . Rectal cancer Neg Hx   . Stomach cancer Neg Hx     No Known Allergies  Current Outpatient Prescriptions on File Prior to Visit  Medication Sig Dispense Refill  . amLODipine (NORVASC) 5 MG tablet Take 1 tablet (5 mg total) by mouth daily. 90 tablet 1  . lenalidomide (REVLIMID) 10 MG capsule Daily for 21 days then 7 days off. PFXT#[id number] 21 capsule 0  . oxyCODONE (OXY IR/ROXICODONE) 5 MG immediate release tablet Take 1 tablet (5 mg total) by mouth every 4 (four) hours as needed for severe pain. 60 tablet 0   No current facility-administered medications on file prior to visit.    BP 134/81 mmHg  Pulse 55  Temp(Src) 98.2 F (36.8 C) (Oral)  Resp 16  Ht 6' (1.829 m)  Wt 203 lb 9.6 oz (92.352 kg)  BMI 27.61 kg/m2  SpO2 100%       Objective:   Physical Exam  Constitutional: He is oriented  to person, place, and time. He appears well-developed and well-nourished. No distress.  HENT:  Head: Normocephalic and atraumatic.  Cardiovascular: Normal rate and regular rhythm.   No murmur heard. Pulmonary/Chest: Effort normal and breath sounds normal. No respiratory distress. He has no wheezes. He has no rales.  Musculoskeletal: He exhibits no edema.  Neurological: He is alert and oriented to person, place, and time.  Skin: Skin is warm and dry.  Psychiatric: He has a normal mood and affect. His behavior is normal. Thought content normal.          Assessment & Plan:

## 2015-08-26 NOTE — Assessment & Plan Note (Signed)
Advised pt to continue vit D supplement weekly x 12 weeks, then schedule lab draw for vitamin D recheck.

## 2015-08-26 NOTE — Progress Notes (Signed)
Pre visit review using our clinic review tool, if applicable. No additional management support is needed unless otherwise documented below in the visit note. 

## 2015-08-26 NOTE — Assessment & Plan Note (Signed)
Management per Oncology.  

## 2015-08-26 NOTE — Assessment & Plan Note (Addendum)
BP stable on amlodipine. Continue same. bmet up to date in epic.

## 2015-09-17 ENCOUNTER — Other Ambulatory Visit: Payer: Self-pay | Admitting: Nurse Practitioner

## 2015-09-17 DIAGNOSIS — C9000 Multiple myeloma not having achieved remission: Secondary | ICD-10-CM

## 2015-09-17 MED ORDER — LENALIDOMIDE 10 MG PO CAPS: ORAL_CAPSULE | ORAL | Status: DC

## 2015-10-04 ENCOUNTER — Ambulatory Visit (HOSPITAL_BASED_OUTPATIENT_CLINIC_OR_DEPARTMENT_OTHER): Payer: 59 | Admitting: Hematology & Oncology

## 2015-10-04 ENCOUNTER — Other Ambulatory Visit (HOSPITAL_BASED_OUTPATIENT_CLINIC_OR_DEPARTMENT_OTHER): Payer: 59

## 2015-10-04 ENCOUNTER — Ambulatory Visit (HOSPITAL_BASED_OUTPATIENT_CLINIC_OR_DEPARTMENT_OTHER): Payer: 59

## 2015-10-04 ENCOUNTER — Encounter: Payer: Self-pay | Admitting: Hematology & Oncology

## 2015-10-04 VITALS — BP 139/89 | HR 63 | Temp 97.3°F | Wt 204.8 lb

## 2015-10-04 DIAGNOSIS — C9000 Multiple myeloma not having achieved remission: Secondary | ICD-10-CM | POA: Diagnosis not present

## 2015-10-04 DIAGNOSIS — Z9484 Stem cells transplant status: Secondary | ICD-10-CM

## 2015-10-04 LAB — CMP (CANCER CENTER ONLY)
ALBUMIN: 3.9 g/dL (ref 3.3–5.5)
ALK PHOS: 41 U/L (ref 26–84)
ALT: 21 U/L (ref 10–47)
AST: 22 U/L (ref 11–38)
BILIRUBIN TOTAL: 1.1 mg/dL (ref 0.20–1.60)
BUN, Bld: 12 mg/dL (ref 7–22)
CALCIUM: 9.5 mg/dL (ref 8.0–10.3)
CO2: 26 meq/L (ref 18–33)
CREATININE: 1.4 mg/dL — AB (ref 0.6–1.2)
Chloride: 102 mEq/L (ref 98–108)
GLUCOSE: 93 mg/dL (ref 73–118)
Potassium: 3.9 mEq/L (ref 3.3–4.7)
SODIUM: 139 meq/L (ref 128–145)
Total Protein: 6.9 g/dL (ref 6.4–8.1)

## 2015-10-04 LAB — CBC WITH DIFFERENTIAL (CANCER CENTER ONLY)
BASO#: 0 10*3/uL (ref 0.0–0.2)
BASO%: 0.6 % (ref 0.0–2.0)
EOS%: 12.6 % — AB (ref 0.0–7.0)
Eosinophils Absolute: 0.5 10*3/uL (ref 0.0–0.5)
HCT: 42.4 % (ref 38.7–49.9)
HGB: 14.8 g/dL (ref 13.0–17.1)
LYMPH#: 1.3 10*3/uL (ref 0.9–3.3)
LYMPH%: 35.7 % (ref 14.0–48.0)
MCH: 31.7 pg (ref 28.0–33.4)
MCHC: 34.9 g/dL (ref 32.0–35.9)
MCV: 91 fL (ref 82–98)
MONO#: 0.4 10*3/uL (ref 0.1–0.9)
MONO%: 12.1 % (ref 0.0–13.0)
NEUT%: 39 % — AB (ref 40.0–80.0)
NEUTROS ABS: 1.4 10*3/uL — AB (ref 1.5–6.5)
Platelets: 189 10*3/uL (ref 145–400)
RBC: 4.67 10*6/uL (ref 4.20–5.70)
RDW: 13.6 % (ref 11.1–15.7)
WBC: 3.6 10*3/uL — ABNORMAL LOW (ref 4.0–10.0)

## 2015-10-04 LAB — MAGNESIUM (CC13): Magnesium: 2.3 mg/dl (ref 1.5–2.5)

## 2015-10-04 MED ORDER — SODIUM CHLORIDE 0.9 % IV SOLN
INTRAVENOUS | Status: DC
  Administered 2015-10-04: 10:00:00 via INTRAVENOUS

## 2015-10-04 MED ORDER — ZOLEDRONIC ACID 4 MG/100ML IV SOLN
4.0000 mg | Freq: Once | INTRAVENOUS | Status: AC
  Administered 2015-10-04: 4 mg via INTRAVENOUS
  Filled 2015-10-04: qty 100

## 2015-10-04 NOTE — Progress Notes (Signed)
Hematology and Oncology Follow Up Visit  Jimmy Day [id number] Aug 06, 1961 54 y.o. 10/04/2015   Principle Diagnosis:   IgG kappa myeloma-status post autologous stem cell transplant in 02/2015  Current Therapy:    Revlimid maintenance therapy 10mg  po q day (21/7)  Zometa 4 mg IV every 3 months     Interim History:  Jimmy Day is back for follow-up. He looks great. We talked quite a bit about the recent presidential election. He and I are on "different sides" but yet, we're able to have a very meaningful dialog which hopefully will translate to a wider audience.  He is working without difficulties. He's having no problems with pain.  He's had no change in bowel or bladder habits. He's had no nausea or vomiting.  He is on 1 full dose aspirin daily. I told him to make sure he takes a full dose aspirin.  He's had no bleeding.  He is exercising.  He is again 4 to Thanksgiving.  Currently, his performance status is ECOG 0    Medications:  Current outpatient prescriptions:  .  acyclovir (ZOVIRAX) 400 MG tablet, Take 1 tablet (400 mg total) by mouth 2 (two) times daily., Disp: 90 tablet, Rfl: 1 .  amLODipine (NORVASC) 5 MG tablet, Take 1 tablet (5 mg total) by mouth daily., Disp: 90 tablet, Rfl: 1 .  lenalidomide (REVLIMID) 10 MG capsule, Daily for 21 days then 7 days off. [id number], Disp: 21 capsule, Rfl: 0 .  oxyCODONE (OXY IR/ROXICODONE) 5 MG immediate release tablet, Take 1 tablet (5 mg total) by mouth every 4 (four) hours as needed for severe pain., Disp: 60 tablet, Rfl: 0 .  sildenafil (VIAGRA) 100 MG tablet, Take 1 tablet (100 mg total) by mouth daily as needed for erectile dysfunction., Disp: 4 tablet, Rfl: 5 .  Vitamin D, Ergocalciferol, (DRISDOL) 50000 UNITS CAPS capsule, Take 1 capsule (50,000 Units total) by mouth every 7 (seven) days., Disp: 7 capsule, Rfl: 0  Allergies: No Known Allergies  Past Medical History, Surgical history, Social history, and Family History  were reviewed and updated.  Review of Systems: As above  Physical Exam:  weight is 204 lb 12.8 oz (92.897 kg). His oral temperature is 97.3 F (36.3 C). His blood pressure is 139/89 and his pulse is 63.   Wt Readings from Last 3 Encounters:  10/04/15 204 lb 12.8 oz (92.897 kg)  08/26/15 203 lb 9.6 oz (92.352 kg)  07/05/15 202 lb (91.627 kg)     Well-developed and well-nourished African-American male. Head and neck exam shows no ocular or oral lesions. There are no palpable cervical or supraclavicular lymph nodes. Lungs are clear. Cardiac exam regular rate and rhythm with no murmurs, rubs or bruits. Abdomen is soft. He has good bowel sounds. There is no fluid wave. There is no palpable liver or spleen tip. Back exam shows no tenderness over the spine, ribs or hips. Extremities shows no clubbing, cyanosis or edema. Skin exam no rashes.  Lab Results  Component Value Date   WBC 3.6* 10/04/2015   HGB 14.8 10/04/2015   HCT 42.4 10/04/2015   MCV 91 10/04/2015   PLT 189 10/04/2015     Chemistry      Component Value Date/Time   NA 139 10/04/2015 0856   NA 137 07/05/2015 0911   NA 142 04/01/2015 0834   K 3.9 10/04/2015 0856   K 3.8 07/05/2015 0911   K 3.4* 04/01/2015 0834   CL 102 10/04/2015 0856   CL  104 07/05/2015 0911   CO2 26 10/04/2015 0856   CO2 20 07/05/2015 0911   CO2 20* 04/01/2015 0834   BUN 12 10/04/2015 0856   BUN 13 07/05/2015 0911   BUN 10.4 04/01/2015 0834   CREATININE 1.4* 10/04/2015 0856   CREATININE 1.11 07/05/2015 0911   CREATININE 1.2 04/01/2015 0834      Component Value Date/Time   CALCIUM 9.5 10/04/2015 0856   CALCIUM 8.6 07/05/2015 0911   CALCIUM 8.6 04/01/2015 0834   ALKPHOS 41 10/04/2015 0856   ALKPHOS 34* 07/05/2015 0911   ALKPHOS 39* 04/01/2015 0834   AST 22 10/04/2015 0856   AST 14 07/05/2015 0911   AST 17 04/01/2015 0834   ALT 21 10/04/2015 0856   ALT 11 07/05/2015 0911   ALT 19 04/01/2015 0834   BILITOT 1.10 10/04/2015 0856   BILITOT 0.7  07/05/2015 0911   BILITOT 0.56 04/01/2015 0834         Impression and Plan: Jimmy Day is 54 year old African-American male. He has IgG kappa myeloma. He underwent a stem cell transplant. He did well with this.  We will continue him on the maintenance Revlimid.  I will see him back in 3 months. I told him to make sure that he drinks a lot of fluid. His creatinine is up just a little bit. We will have to watch this.   [name], MD 11/11/20169:49 AM

## 2015-10-04 NOTE — Patient Instructions (Signed)

## 2015-10-05 LAB — IGG, IGA, IGM
IGA: 23 mg/dL — AB (ref 68–379)
IGG (IMMUNOGLOBIN G), SERUM: 959 mg/dL (ref 650–1600)
IgM, Serum: 15 mg/dL — ABNORMAL LOW (ref 41–251)

## 2015-10-08 LAB — SPEP & IFE WITH QIG
ALBUMIN ELP: 4.3 g/dL (ref 3.8–4.8)
ALPHA-1-GLOBULIN: 0.4 g/dL — AB (ref 0.2–0.3)
Alpha-2-Globulin: 0.5 g/dL (ref 0.5–0.9)
BETA 2: 0.2 g/dL (ref 0.2–0.5)
Beta Globulin: 0.4 g/dL (ref 0.4–0.6)
GAMMA GLOBULIN: 1 g/dL (ref 0.8–1.7)
IGA: 13 mg/dL — AB (ref 68–379)
IGM, SERUM: 9 mg/dL — AB (ref 41–251)
IgG (Immunoglobin G), Serum: 1190 mg/dL (ref 650–1600)
Total Protein, Serum Electrophoresis: 6.8 g/dL (ref 6.1–8.1)

## 2015-10-08 LAB — KAPPA/LAMBDA LIGHT CHAINS
KAPPA LAMBDA RATIO: 1.81 — AB (ref 0.26–1.65)
Kappa free light chain: 2.34 mg/dL — ABNORMAL HIGH (ref 0.33–1.94)
Lambda Free Lght Chn: 1.29 mg/dL (ref 0.57–2.63)

## 2015-10-08 LAB — VITAMIN D 25 HYDROXY (VIT D DEFICIENCY, FRACTURES): Vit D, 25-Hydroxy: 37 ng/mL (ref 30–100)

## 2015-10-10 ENCOUNTER — Other Ambulatory Visit: Payer: Self-pay | Admitting: Nurse Practitioner

## 2015-10-10 DIAGNOSIS — C9000 Multiple myeloma not having achieved remission: Secondary | ICD-10-CM

## 2015-10-10 MED ORDER — LENALIDOMIDE 10 MG PO CAPS: ORAL_CAPSULE | ORAL | Status: DC

## 2015-11-01 ENCOUNTER — Other Ambulatory Visit: Payer: 59

## 2015-11-14 ENCOUNTER — Other Ambulatory Visit: Payer: Self-pay | Admitting: Family

## 2015-11-15 ENCOUNTER — Other Ambulatory Visit: Payer: Self-pay | Admitting: *Deleted

## 2015-11-15 DIAGNOSIS — C9000 Multiple myeloma not having achieved remission: Secondary | ICD-10-CM

## 2015-11-15 MED ORDER — LENALIDOMIDE 10 MG PO CAPS: ORAL_CAPSULE | ORAL | Status: DC

## 2015-11-21 ENCOUNTER — Other Ambulatory Visit: Payer: 59

## 2015-12-04 ENCOUNTER — Other Ambulatory Visit (INDEPENDENT_AMBULATORY_CARE_PROVIDER_SITE_OTHER): Payer: 59

## 2015-12-04 ENCOUNTER — Telehealth: Payer: Self-pay | Admitting: Family

## 2015-12-04 DIAGNOSIS — E559 Vitamin D deficiency, unspecified: Secondary | ICD-10-CM | POA: Diagnosis not present

## 2015-12-04 LAB — VITAMIN D 25 HYDROXY (VIT D DEFICIENCY, FRACTURES): VITD: 26.8 ng/mL — AB (ref 30.00–100.00)

## 2015-12-04 NOTE — Telephone Encounter (Signed)
Vit D is still low. Please confirm that he has been taking vit D 50000 units once weekly. If so, I would like him to increase to 50000 units 2 x a week and repeat vit D in 3 months.

## 2015-12-04 NOTE — Telephone Encounter (Signed)
Notified pt. He states that he was out of vitamin D x 1 month.  Do you still want him to proceed with twice weekly dosing? If so, pt prefers Rx to go to [facility].

## 2015-12-05 NOTE — Telephone Encounter (Signed)
No, lets just do once weekly please x 12 weeks.

## 2015-12-06 MED ORDER — VITAMIN D (ERGOCALCIFEROL) 1.25 MG (50000 UNIT) PO CAPS: 50000.0000 [IU] | ORAL_CAPSULE | ORAL | Status: DC

## 2015-12-06 MED FILL — VIT D2 1.25 MG (50,000 UNIT: 1.25 MG | 28 days supply | Qty: 4 | Fill #0

## 2015-12-06 NOTE — Telephone Encounter (Signed)
Rx sent, notified pt. He has f/u with PCP on 02/28/16 and was advised that he can do vitamin D recheck at that visit. Pt states he has appt in [place] on 4/12 and wanted to know if we could r/s appt for that date.  Looks like PCP may be off during that time and scheduled is blocked. Pt states he will call us back if he needs to r/s appt.

## 2015-12-16 ENCOUNTER — Other Ambulatory Visit: Payer: Self-pay | Admitting: *Deleted

## 2015-12-16 DIAGNOSIS — C9000 Multiple myeloma not having achieved remission: Secondary | ICD-10-CM

## 2015-12-16 MED ORDER — LENALIDOMIDE 10 MG PO CAPS: ORAL_CAPSULE | ORAL | Status: DC

## 2015-12-20 ENCOUNTER — Telehealth: Payer: Self-pay | Admitting: Internal Medicine

## 2015-12-20 NOTE — Telephone Encounter (Signed)
Hi [name]  Do you mind calling me about Jimmy Day [facility] 12-04[facility]60 CT chest please?  My cell [phone]  THanks  Dr. Brand Males, M.D., [facility] An.C.P Pulmonary and Critical Care Medicine Staff Physician [place] [facility] Pager: [phone], If no answer or between  15:00h - 7:00h: call [phone]  12/20/2015 3:42 PM

## 2016-01-03 ENCOUNTER — Other Ambulatory Visit (HOSPITAL_BASED_OUTPATIENT_CLINIC_OR_DEPARTMENT_OTHER): Payer: 59

## 2016-01-03 ENCOUNTER — Ambulatory Visit (HOSPITAL_BASED_OUTPATIENT_CLINIC_OR_DEPARTMENT_OTHER): Payer: 59 | Admitting: Family

## 2016-01-03 ENCOUNTER — Ambulatory Visit (HOSPITAL_BASED_OUTPATIENT_CLINIC_OR_DEPARTMENT_OTHER): Payer: 59

## 2016-01-03 ENCOUNTER — Encounter: Payer: Self-pay | Admitting: Family

## 2016-01-03 VITALS — BP 137/83 | HR 57 | Temp 97.8°F | Resp 16 | Ht 72.0 in | Wt 199.0 lb

## 2016-01-03 DIAGNOSIS — C9000 Multiple myeloma not having achieved remission: Secondary | ICD-10-CM

## 2016-01-03 DIAGNOSIS — Z9484 Stem cells transplant status: Secondary | ICD-10-CM | POA: Diagnosis not present

## 2016-01-03 LAB — CMP (CANCER CENTER ONLY)
ALT(SGPT): 21 U/L (ref 10–47)
AST: 22 U/L (ref 11–38)
Albumin: 3.9 g/dL (ref 3.3–5.5)
Alkaline Phosphatase: 35 U/L (ref 26–84)
BUN: 10 mg/dL (ref 7–22)
CHLORIDE: 105 meq/L (ref 98–108)
CO2: 28 meq/L (ref 18–33)
CREATININE: 1.2 mg/dL (ref 0.6–1.2)
Calcium: 8.6 mg/dL (ref 8.0–10.3)
GLUCOSE: 98 mg/dL (ref 73–118)
Potassium: 3.7 mEq/L (ref 3.3–4.7)
SODIUM: 135 meq/L (ref 128–145)
Total Bilirubin: 0.9 mg/dl (ref 0.20–1.60)
Total Protein: 7 g/dL (ref 6.4–8.1)

## 2016-01-03 LAB — CBC WITH DIFFERENTIAL (CANCER CENTER ONLY)
BASO#: 0 10*3/uL (ref 0.0–0.2)
BASO%: 0.3 % (ref 0.0–2.0)
EOS ABS: 0.3 10*3/uL (ref 0.0–0.5)
EOS%: 8.3 % — AB (ref 0.0–7.0)
HCT: 40.4 % (ref 38.7–49.9)
HEMOGLOBIN: 14.6 g/dL (ref 13.0–17.1)
LYMPH#: 1.4 10*3/uL (ref 0.9–3.3)
LYMPH%: 39.6 % (ref 14.0–48.0)
MCH: 31.8 pg (ref 28.0–33.4)
MCHC: 36.1 g/dL — AB (ref 32.0–35.9)
MCV: 88 fL (ref 82–98)
MONO#: 0.4 10*3/uL (ref 0.1–0.9)
MONO%: 11.4 % (ref 0.0–13.0)
NEUT%: 40.4 % (ref 40.0–80.0)
NEUTROS ABS: 1.5 10*3/uL (ref 1.5–6.5)
PLATELETS: 217 10*3/uL (ref 145–400)
RBC: 4.59 10*6/uL (ref 4.20–5.70)
RDW: 12.9 % (ref 11.1–15.7)
WBC: 3.6 10*3/uL — ABNORMAL LOW (ref 4.0–10.0)

## 2016-01-03 MED ORDER — ZOLEDRONIC ACID 4 MG/100ML IV SOLN
4.0000 mg | Freq: Once | INTRAVENOUS | Status: AC
  Administered 2016-01-03: 4 mg via INTRAVENOUS
  Filled 2016-01-03: qty 100

## 2016-01-03 NOTE — Progress Notes (Signed)
Hematology and Oncology Follow Up Visit  Jimmy Day [facility] [id number] November 22, 1961 55 y.o. 01/03/2016   Principle Diagnosis:  IgG Kappa myeloma  Current Therapy:   Revlimid 10 mg daily (21/7) Zometa 4 mg IV every 3 months  Status post autologous stem cell transplant (April 2016)    Interim History:  Jimmy Day is here today for a follow-up. He is doing fairly well but complains of having midsternal "toothache" type pain when he inhales deeply. He states that he is quite active working out, running and walking almost daily and these activities do not bother him or cause pain while breathing.  His recent CT scan at [facility] showed no explanation for his c/o pleuritic chest pain.  He follows up with [facility] again in 2 months.  No fever, chills, n/v, rash, dizziness, SOB, chest pain, palpitations, abdominal pain or changes in bowel or bladder habits.  He states that he has had a mild cough that is "clearing up."  No lymphadenopathy on exam.  No swelling, tenderness, numbness or tingling in his extremities. No c/o joint aches or "bone" pain. He has maintained a good appetite and is staying well hydrated.    Medications:    Medication List       This list is accurate as of: 01/03/16  4:01 PM.  Always use your most recent med list.               acyclovir 400 MG tablet  Commonly known as:  ZOVIRAX  Take 1 tablet (400 mg total) by mouth 2 (two) times daily.     amLODipine 5 MG tablet  Commonly known as:  NORVASC  Take 1 tablet (5 mg total) by mouth daily.     lenalidomide 10 MG capsule  Commonly known as:  REVLIMID  Daily for 21 days then 7 days off. [id number]     oxyCODONE 5 MG immediate release tablet  Commonly known as:  Oxy IR/ROXICODONE  Take 1 tablet (5 mg total) by mouth every 4 (four) hours as needed for severe pain.     sildenafil 100 MG tablet  Commonly known as:  VIAGRA  Take 1 tablet (100 mg total) by mouth daily as needed for erectile dysfunction.     Vitamin D  (Ergocalciferol) 50000 units Caps capsule  Commonly known as:  DRISDOL  Take 1 capsule (50,000 Units total) by mouth every 7 (seven) days.        Allergies: No Known Allergies  Past Medical History, Surgical history, Social history, and Family History were reviewed and updated.  Review of Systems: All other 10 point review of systems is negative.   Physical Exam:  height is 6' (1.829 m) and weight is 199 lb (90.266 kg). His oral temperature is 97.8 F (36.6 C). His blood pressure is 137/83 and his pulse is 57. His respiration is 16.   Wt Readings from Last 3 Encounters:  01/03/16 199 lb (90.266 kg)  10/04/15 204 lb 12.8 oz (92.897 kg)  08/26/15 203 lb 9.6 oz (92.352 kg)    Ocular: Sclerae unicteric, pupils equal, round and reactive to light Ear-nose-throat: Oropharynx clear, dentition fair Lymphatic: No cervical supraclavicular or axillary adenopathy Lungs no rales or rhonchi, good excursion bilaterally Heart regular rate and rhythm, no murmur appreciated Abd soft, nontender, positive bowel sounds, no liver or spleen tip palpated on exam  MSK no focal spinal tenderness, no joint edema Neuro: non-focal, well-oriented, appropriate affect  Lab Results  Component Value Date   WBC 3.6*  01/03/2016   HGB 14.6 01/03/2016   HCT 40.4 01/03/2016   MCV 88 01/03/2016   PLT 217 01/03/2016   Lab Results  Component Value Date   FERRITIN 152 08/17/2014   IRON 106 08/17/2014   TIBC 253 08/17/2014   UIBC 146 08/17/2014   IRONPCTSAT 42 08/17/2014   Lab Results  Component Value Date   RBC 4.59 01/03/2016   Lab Results  Component Value Date   KPAFRELGTCHN 2.34* 10/04/2015   LAMBDASER 1.29 10/04/2015   KAPLAMBRATIO 1.81* 10/04/2015   Lab Results  Component Value Date   IGGSERUM 959 10/04/2015   IGA 23* 10/04/2015   IGMSERUM 15* 10/04/2015   Lab Results  Component Value Date   TOTALPROTELP 6.8 10/04/2015   ALBUMINELP 4.3 10/04/2015   A1GS 0.4* 10/04/2015   A2GS 0.5  10/04/2015   BETS 0.4 10/04/2015   BETA2SER 0.2 10/04/2015   GAMS 1.0 10/04/2015   MSPIKE 0.25 11/02/2014   SPEI * 10/04/2015     Chemistry      Component Value Date/Time   NA 135 01/03/2016 0856   NA 137 07/05/2015 0911   NA 142 04/01/2015 0834   K 3.7 01/03/2016 0856   K 3.8 07/05/2015 0911   K 3.4* 04/01/2015 0834   CL 105 01/03/2016 0856   CL 104 07/05/2015 0911   CO2 28 01/03/2016 0856   CO2 20 07/05/2015 0911   CO2 20* 04/01/2015 0834   BUN 10 01/03/2016 0856   BUN 13 07/05/2015 0911   BUN 10.4 04/01/2015 0834   CREATININE 1.2 01/03/2016 0856   CREATININE 1.11 07/05/2015 0911   CREATININE 1.2 04/01/2015 0834      Component Value Date/Time   CALCIUM 8.6 01/03/2016 0856   CALCIUM 8.6 07/05/2015 0911   CALCIUM 8.6 04/01/2015 0834   ALKPHOS 35 01/03/2016 0856   ALKPHOS 34* 07/05/2015 0911   ALKPHOS 39* 04/01/2015 0834   AST 22 01/03/2016 0856   AST 14 07/05/2015 0911   AST 17 04/01/2015 0834   ALT 21 01/03/2016 0856   ALT 11 07/05/2015 0911   ALT 19 04/01/2015 0834   BILITOT 0.90 01/03/2016 0856   BILITOT 0.7 07/05/2015 0911   BILITOT 0.56 04/01/2015 0834     Impression and Plan: Jimmy Day is 55 year old gentleman with IgG kappa myeloma s/p autologous stem cell transplant in April 2016. So far, he has done well.  He will continue on maintenance Revlimid and will receive his Zometa infusion today.  His CBC and CMP today look good. No anemia. His creatinine is back to normal.  His CT scan at [facility] showed no explanation for his c/o pleuritic chest pain. We will repeat the scan in 1 month to re-evaluate.   We will plan to see him back in 3 months for labs, follow-up and infusion.  He knows to contact us with any questions or concerns. We can certainly see him sooner if need be.   Eliezer Bottom, NP 2/10/20174:01 PM

## 2016-01-03 NOTE — Patient Instructions (Signed)

## 2016-01-04 LAB — IGG, IGA, IGM
IGM (IMMUNOGLOBIN M), SRM: 18 mg/dL — AB (ref 20–172)
IgA, Qn, Serum: 11 mg/dL — ABNORMAL LOW (ref 90–386)
IgG, Qn, Serum: 1100 mg/dL (ref 700–1600)

## 2016-01-06 LAB — PROTEIN ELECTROPHORESIS, SERUM, WITH REFLEX
A/G RATIO SPE: 1.5 (ref 0.7–1.7)
ALBUMIN: 3.9 g/dL (ref 2.9–4.4)
ALPHA 1: 0.2 g/dL (ref 0.0–0.4)
Alpha 2: 0.4 g/dL (ref 0.4–1.0)
Beta: 0.9 g/dL (ref 0.7–1.3)
GAMMA GLOBULIN: 1 g/dL (ref 0.4–1.8)
Globulin, Total: 2.6 g/dL (ref 2.2–3.9)
TOTAL PROTEIN: 6.5 g/dL (ref 6.0–8.5)

## 2016-01-06 LAB — KAPPA/LAMBDA LIGHT CHAINS
Ig Kappa Free Light Chain: 22.26 mg/L — ABNORMAL HIGH (ref 3.30–19.40)
Ig Lambda Free Light Chain: 15.3 mg/L (ref 5.71–26.30)
KAPPA/LAMBDA FLC RATIO: 1.45 (ref 0.26–1.65)

## 2016-01-12 ENCOUNTER — Other Ambulatory Visit: Payer: Self-pay | Admitting: Family

## 2016-01-20 ENCOUNTER — Other Ambulatory Visit: Payer: Self-pay | Admitting: *Deleted

## 2016-01-20 DIAGNOSIS — C9000 Multiple myeloma not having achieved remission: Secondary | ICD-10-CM

## 2016-01-20 MED ORDER — LENALIDOMIDE 10 MG PO CAPS
ORAL_CAPSULE | ORAL | Status: DC
Start: 2016-01-20 — End: 2016-03-09

## 2016-01-20 MED FILL — VIT D2 1.25 MG (50,000 UNIT: 1.25 MG | 28 days supply | Qty: 4 | Fill #1

## 2016-01-20 MED FILL — VIAGRA 100 MG TABLET: 100 | 30 days supply | Qty: 4 | Fill #7

## 2016-01-31 ENCOUNTER — Ambulatory Visit (HOSPITAL_BASED_OUTPATIENT_CLINIC_OR_DEPARTMENT_OTHER): Payer: 59

## 2016-01-31 ENCOUNTER — Telehealth: Payer: Self-pay | Admitting: Hematology & Oncology

## 2016-01-31 NOTE — Telephone Encounter (Signed)
ALLEGIANCE a Government social research officer. CT Chest w contrast [phone]) APPROVED  Auth: [id number] Valid: 90 days   P: [id number] F: [id number]     COPY SCANNED

## 2016-02-11 ENCOUNTER — Ambulatory Visit (HOSPITAL_BASED_OUTPATIENT_CLINIC_OR_DEPARTMENT_OTHER): Payer: 59

## 2016-02-28 ENCOUNTER — Ambulatory Visit (INDEPENDENT_AMBULATORY_CARE_PROVIDER_SITE_OTHER): Payer: 59 | Admitting: Family

## 2016-02-28 ENCOUNTER — Encounter: Payer: Self-pay | Admitting: Family

## 2016-02-28 VITALS — BP 120/82 | HR 64 | Temp 98.4°F | Ht 71.0 in | Wt 200.1 lb

## 2016-02-28 DIAGNOSIS — E291 Testicular hypofunction: Secondary | ICD-10-CM | POA: Diagnosis not present

## 2016-02-28 DIAGNOSIS — N529 Male erectile dysfunction, unspecified: Secondary | ICD-10-CM | POA: Diagnosis not present

## 2016-02-28 DIAGNOSIS — I10 Essential (primary) hypertension: Secondary | ICD-10-CM | POA: Diagnosis not present

## 2016-02-28 DIAGNOSIS — E559 Vitamin D deficiency, unspecified: Secondary | ICD-10-CM | POA: Diagnosis not present

## 2016-02-28 LAB — VITAMIN D 25 HYDROXY (VIT D DEFICIENCY, FRACTURES): VITD: 38.81 ng/mL (ref 30.00–100.00)

## 2016-02-28 MED ORDER — ACYCLOVIR 400 MG PO TABS: 400.0000 mg | ORAL_TABLET | Freq: Two times a day (BID) | ORAL | Status: DC

## 2016-02-28 MED FILL — VIAGRA 100 MG TABLET: 100 | 30 days supply | Qty: 4 | Fill #8

## 2016-02-28 MED FILL — ACYCLOVIR 400 MG TABLET: 400 | 30 days supply | Qty: 60 | Fill #0

## 2016-02-28 MED FILL — VIT D2 1.25 MG (50,000 UNIT: 1.25 MG | 28 days supply | Qty: 4 | Fill #2

## 2016-02-28 NOTE — Assessment & Plan Note (Signed)
Stable on prn viagra.

## 2016-02-28 NOTE — Assessment & Plan Note (Signed)
BP stable on amlodipine.

## 2016-02-28 NOTE — Progress Notes (Signed)
Pre visit review using our clinic review tool, if applicable. No additional management support is needed unless otherwise documented below in the visit note. 

## 2016-02-28 NOTE — Assessment & Plan Note (Signed)
Obtain follow up vit D level.  

## 2016-02-28 NOTE — Assessment & Plan Note (Signed)
He has not seen urology in some time. Advised pt to arrange follow up with urology.

## 2016-02-28 NOTE — Progress Notes (Signed)
Subjective:    Patient ID: Jimmy Day, male    DOB: 1960-12-09, 55 y.o.   MRN: [id number]  HPI  Jimmy Day is a 55 yr old male who presents today for follow up.  1) HTN- on amlodipine once daily. No problems with current medication BP Readings from Last 3 Encounters:  02/28/16 120/82  01/03/16 137/83  10/04/15 139/89    Lab Results  Component Value Date   CHOL 90 06/03/2015   HDL 34* 06/03/2015   LDLCALC 39 06/03/2015   LDLDIRECT 88.5 08/07/2013   TRIG 84 06/03/2015   CHOLHDL 2.6 06/03/2015   2) Vit D deficiency-  Continues weekly vit D 50000 iu.   3) ED- Viagra is helping.  Review of Systems  Respiratory: Negative for shortness of breath.   Cardiovascular: Negative for chest pain and leg swelling.   Past Medical History  Diagnosis Date  . Fasting hyperglycemia     NORMAL A1c  . Hyperlipidemia   . Testosterone deficiency     Dr Hartley Barefoot  . Hypertension   . Kappa light chain myeloma ([place]) 02/02/2014    Social History   Social History  . Marital Status: Married    Spouse Name: N/A  . Number of Children: 3  . Years of Education: N/A   Occupational History  . Truck Geophysicist/field seismologist    Social History Main Topics  . Smoking status: Never Smoker   . Smokeless tobacco: Never Used     Comment: never used tobacco  . Alcohol Use: 0.0 oz/week    0 Standard drinks or equivalent per week     Comment:  rarely  . Drug Use: No  . Sexual Activity: Yes    Birth Control/ Protection: Condom   Other Topics Concern  . 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 Lipomas removal      BENIGN FROM CHEST AND BACK  . Cervical plate insertion      POST COMPRESSION DISC INJURY    Family History  Problem Relation Age of Onset  .  Breast cancer Mother   . Heart attack Father 77  . Kidney disease Brother     RENAL FAILURE  . Hypertension Brother   . Heart attack Maternal Aunt 65    CABG  . Stroke Maternal Uncle     CVA  . Diabetes Neg Hx   . Colon cancer Neg Hx   . Esophageal cancer Neg Hx   . Rectal cancer Neg Hx   . Stomach cancer Neg Hx     No Known Allergies  Current Outpatient Prescriptions on File Prior to Visit  Medication Sig Dispense Refill  . acyclovir (ZOVIRAX) 400 MG tablet Take 1 tablet (400 mg total) by mouth 2 (two) times daily. 90 tablet 1  . amLODipine (NORVASC) 5 MG tablet TAKE 1 TABLET DAILY 90 tablet 1  . lenalidomide (REVLIMID) 10 MG capsule Daily for 21 days then 7 days off. [id number] 21 capsule 0  . sildenafil (VIAGRA) 100 MG tablet Take 1 tablet (100 mg total) by mouth daily as needed for erectile dysfunction. 4 tablet 5  . Vitamin D, Ergocalciferol, (DRISDOL) 50000 units CAPS capsule Take 1  capsule (50,000 Units total) by mouth every 7 (seven) days. 12 capsule 0   No current facility-administered medications on file prior to visit.    BP 120/82 mmHg  Pulse 64  Temp(Src) 98.4 F (36.9 C) (Oral)  Ht 5\' 11"  (1.803 m)  Wt 200 lb 2 oz (90.776 kg)  BMI 27.92 kg/m2  SpO2 98%       Objective:   Physical Exam  Constitutional: He is oriented to person, place, and time. He appears well-developed and well-nourished. No distress.  HENT:  Head: Normocephalic and atraumatic.  Cardiovascular: Normal rate and regular rhythm.   No murmur heard. Pulmonary/Chest: Effort normal and breath sounds normal. No respiratory distress. He has no wheezes. He has no rales.  Musculoskeletal: He exhibits no edema.  Neurological: He is alert and oriented to person, place, and time.  Skin: Skin is warm and dry.  Psychiatric: He has a normal mood and affect. His behavior is normal. Thought content normal.          Assessment & Plan:

## 2016-02-28 NOTE — Patient Instructions (Addendum)
Please complete lab work prior to leaving. Please schedule a complete physical at the front desk.  

## 2016-02-29 ENCOUNTER — Other Ambulatory Visit: Payer: Self-pay | Admitting: Family

## 2016-02-29 MED ORDER — VITAMIN D3 75 MCG (3000 UT) PO TABS: 1.0000 | ORAL_TABLET | Freq: Every day | ORAL | Status: DC

## 2016-03-09 ENCOUNTER — Other Ambulatory Visit: Payer: Self-pay | Admitting: Nurse Practitioner

## 2016-03-09 DIAGNOSIS — C9000 Multiple myeloma not having achieved remission: Secondary | ICD-10-CM

## 2016-03-09 MED ORDER — LENALIDOMIDE 10 MG PO CAPS: ORAL_CAPSULE | ORAL | Status: DC

## 2016-03-31 ENCOUNTER — Other Ambulatory Visit: Payer: Self-pay | Admitting: Family

## 2016-04-03 ENCOUNTER — Other Ambulatory Visit (HOSPITAL_BASED_OUTPATIENT_CLINIC_OR_DEPARTMENT_OTHER): Payer: 59

## 2016-04-03 ENCOUNTER — Ambulatory Visit (HOSPITAL_BASED_OUTPATIENT_CLINIC_OR_DEPARTMENT_OTHER): Payer: 59

## 2016-04-03 ENCOUNTER — Encounter: Payer: Self-pay | Admitting: Family

## 2016-04-03 ENCOUNTER — Ambulatory Visit (HOSPITAL_BASED_OUTPATIENT_CLINIC_OR_DEPARTMENT_OTHER): Payer: 59 | Admitting: Family

## 2016-04-03 VITALS — BP 121/82 | HR 56 | Temp 97.8°F | Resp 16 | Ht 71.0 in | Wt 201.0 lb

## 2016-04-03 DIAGNOSIS — C9000 Multiple myeloma not having achieved remission: Secondary | ICD-10-CM | POA: Diagnosis not present

## 2016-04-03 DIAGNOSIS — E291 Testicular hypofunction: Secondary | ICD-10-CM

## 2016-04-03 DIAGNOSIS — Z9484 Stem cells transplant status: Secondary | ICD-10-CM

## 2016-04-03 LAB — CBC WITH DIFFERENTIAL (CANCER CENTER ONLY)
BASO#: 0 10*3/uL (ref 0.0–0.2)
BASO%: 0.5 % (ref 0.0–2.0)
EOS%: 15.6 % — ABNORMAL HIGH (ref 0.0–7.0)
Eosinophils Absolute: 0.6 10*3/uL — ABNORMAL HIGH (ref 0.0–0.5)
HCT: 40.1 % (ref 38.7–49.9)
HGB: 14.5 g/dL (ref 13.0–17.1)
LYMPH#: 1.4 10*3/uL (ref 0.9–3.3)
LYMPH%: 34.9 % (ref 14.0–48.0)
MCH: 32.7 pg (ref 28.0–33.4)
MCHC: 36.2 g/dL — ABNORMAL HIGH (ref 32.0–35.9)
MCV: 91 fL (ref 82–98)
MONO#: 0.5 10*3/uL (ref 0.1–0.9)
MONO%: 13.6 % — AB (ref 0.0–13.0)
NEUT#: 1.4 10*3/uL — ABNORMAL LOW (ref 1.5–6.5)
NEUT%: 35.4 % — AB (ref 40.0–80.0)
PLATELETS: 182 10*3/uL (ref 145–400)
RBC: 4.43 10*6/uL (ref 4.20–5.70)
RDW: 13.1 % (ref 11.1–15.7)
WBC: 3.9 10*3/uL — ABNORMAL LOW (ref 4.0–10.0)

## 2016-04-03 LAB — CMP (CANCER CENTER ONLY)
ALK PHOS: 35 U/L (ref 26–84)
ALT: 24 U/L (ref 10–47)
AST: 20 U/L (ref 11–38)
Albumin: 3.8 g/dL (ref 3.3–5.5)
BUN: 11 mg/dL (ref 7–22)
CO2: 26 mEq/L (ref 18–33)
Calcium: 9.2 mg/dL (ref 8.0–10.3)
Chloride: 106 mEq/L (ref 98–108)
Creat: 1.4 mg/dl — ABNORMAL HIGH (ref 0.6–1.2)
Glucose, Bld: 91 mg/dL (ref 73–118)
Potassium: 3.9 mEq/L (ref 3.3–4.7)
SODIUM: 133 meq/L (ref 128–145)
TOTAL PROTEIN: 6.7 g/dL (ref 6.4–8.1)
Total Bilirubin: 1 mg/dl (ref 0.20–1.60)

## 2016-04-03 MED ORDER — ZOLEDRONIC ACID 4 MG/100ML IV SOLN
4.0000 mg | Freq: Once | INTRAVENOUS | Status: AC
  Administered 2016-04-03: 4 mg via INTRAVENOUS
  Filled 2016-04-03: qty 100

## 2016-04-03 MED FILL — VIAGRA 100 MG TABLET: 100 | 30 days supply | Qty: 4 | Fill #9

## 2016-04-03 NOTE — Progress Notes (Signed)
Hematology and Oncology Follow Up Visit  Doyne Barcroft [facility] [id number] Nov 09, 1961 55 y.o. 04/03/2016   Principle Diagnosis:  IgG Kappa myeloma  Current Therapy:   Revlimid 10 mg daily (21/7) Zometa 4 mg IV every 3 months  Status post autologous stem cell transplant (April 2016)    Interim History:  Mr. Crear is here today for a follow-up. He is asymptomatic and has no complaints at this time. He is doing well on Revlimid and has had no issues with side effects.   In April, his IgG level was 1210 and no M-spike was detected. His Myeloma studies were redrawn today and results are pending.  He was seen at [facility] in April and will follow-up with them again in June He follows up with urology in August. He did ask that we check his PSA today since it has been over a year.  He is staying active working out in his home gym and mowing the lawn. He really enjoys staying busy.  No fever, chills, n/v, cough, rash, dizziness, headaches, vision changes, SOB, chest pain, palpitations, abdominal pain or changes in bowel or bladder habits.  No lymphadenopathy on exam. No episodes of bleeding or bruising.  No swelling, tenderness, numbness or tingling in his extremities. No c/o joint aches or "bone" pain. He is eating well and staying hydrated. His weight is unchanged.    Medications:    Medication List       This list is accurate as of: 04/03/16  9:27 AM.  Always use your most recent med list.               acyclovir 400 MG tablet  Commonly known as:  ZOVIRAX  Take 1 tablet (400 mg total) by mouth 2 (two) times daily.     amLODipine 5 MG tablet  Commonly known as:  NORVASC  TAKE 1 TABLET DAILY     lenalidomide 10 MG capsule  Commonly known as:  REVLIMID  Daily for 21 days then 7 days off. [id number]     sildenafil 100 MG tablet  Commonly known as:  VIAGRA  Take 1 tablet (100 mg total) by mouth daily as needed for erectile dysfunction.     Vitamin D3 3000 units Tabs  Take 1 tablet by mouth  daily.        Allergies: No Known Allergies  Past Medical History, Surgical history, Social history, and Family History were reviewed and updated.  Review of Systems: All other 10 point review of systems is negative.   Physical Exam:  vitals were not taken for this visit.  Wt Readings from Last 3 Encounters:  02/28/16 200 lb 2 oz (90.776 kg)  01/03/16 199 lb (90.266 kg)  10/04/15 204 lb 12.8 oz (92.897 kg)    Ocular: Sclerae unicteric, pupils equal, round and reactive to light Ear-nose-throat: Oropharynx clear, dentition fair Lymphatic: No cervical supraclavicular or axillary adenopathy Lungs no rales or rhonchi, good excursion bilaterally Heart regular rate and rhythm, no murmur appreciated Abd soft, nontender, positive bowel sounds, no liver or spleen tip palpated on exam, no fluid wave MSK no focal spinal tenderness, no joint edema Neuro: non-focal, well-oriented, appropriate affect Breast: Deferred  Lab Results  Component Value Date   WBC 3.9* 04/03/2016   HGB 14.5 04/03/2016   HCT 40.1 04/03/2016   MCV 91 04/03/2016   PLT 182 04/03/2016   Lab Results  Component Value Date   FERRITIN 152 08/17/2014   IRON 106 08/17/2014   TIBC 253 08/17/2014  UIBC 146 08/17/2014   IRONPCTSAT 42 08/17/2014   Lab Results  Component Value Date   RBC 4.43 04/03/2016   Lab Results  Component Value Date   KPAFRELGTCHN 2.34* 10/04/2015   LAMBDASER 1.29 10/04/2015   KAPLAMBRATIO 1.45 01/03/2016   Lab Results  Component Value Date   IGGSERUM 1,100 01/03/2016   IGA 23* 10/04/2015   IGMSERUM 18* 01/03/2016   Lab Results  Component Value Date   TOTALPROTELP 6.8 10/04/2015   ALBUMINELP 4.3 10/04/2015   A1GS 0.4* 10/04/2015   A2GS 0.5 10/04/2015   BETS 0.4 10/04/2015   BETA2SER 0.2 10/04/2015   GAMS 1.0 10/04/2015   MSPIKE Not Observed 01/03/2016   SPEI * 10/04/2015     Chemistry      Component Value Date/Time   NA 135 01/03/2016 0856   NA 137 07/05/2015 0911    NA 142 04/01/2015 0834   K 3.7 01/03/2016 0856   K 3.8 07/05/2015 0911   K 3.4* 04/01/2015 0834   CL 105 01/03/2016 0856   CL 104 07/05/2015 0911   CO2 28 01/03/2016 0856   CO2 20 07/05/2015 0911   CO2 20* 04/01/2015 0834   BUN 10 01/03/2016 0856   BUN 13 07/05/2015 0911   BUN 10.4 04/01/2015 0834   CREATININE 1.2 01/03/2016 0856   CREATININE 1.11 07/05/2015 0911   CREATININE 1.2 04/01/2015 0834      Component Value Date/Time   CALCIUM 8.6 01/03/2016 0856   CALCIUM 8.6 07/05/2015 0911   CALCIUM 8.6 04/01/2015 0834   ALKPHOS 35 01/03/2016 0856   ALKPHOS 34* 07/05/2015 0911   ALKPHOS 39* 04/01/2015 0834   AST 22 01/03/2016 0856   AST 14 07/05/2015 0911   AST 17 04/01/2015 0834   ALT 21 01/03/2016 0856   ALT 11 07/05/2015 0911   ALT 19 04/01/2015 0834   BILITOT 0.90 01/03/2016 0856   BILITOT 0.7 07/05/2015 0911   BILITOT 0.56 04/01/2015 0834     Impression and Plan: Mr. Sherburn is 55 year old gentleman with IgG kappa myeloma s/p autologous stem cell transplant in April 2016. So far, he has done well and there has been no evidence of recurrence.  He will continue on maintenance Revlimid and will receive his Zometa infusion today.  CBC and CMP today look good. No anemia. Myeloma studies are pending.  We will plan to see him back in 3 months for labs, follow-up and infusion.  He will contact us with any questions or concerns. We can certainly see him sooner if need be.   Eliezer Bottom, NP 5/12/20179:27 AM

## 2016-04-03 NOTE — Patient Instructions (Signed)

## 2016-04-04 LAB — IGG, IGA, IGM
IGA/IMMUNOGLOBULIN A, SERUM: 17 mg/dL — AB (ref 90–386)
IgM, Qn, Serum: 27 mg/dL (ref 20–172)

## 2016-04-04 LAB — PSA: PROSTATE SPECIFIC AG, SERUM: 3.7 ng/mL (ref 0.0–4.0)

## 2016-04-06 ENCOUNTER — Other Ambulatory Visit: Payer: Self-pay | Admitting: *Deleted

## 2016-04-06 DIAGNOSIS — C9000 Multiple myeloma not having achieved remission: Secondary | ICD-10-CM

## 2016-04-06 LAB — KAPPA/LAMBDA LIGHT CHAINS
IG KAPPA FREE LIGHT CHAIN: 25.93 mg/L — AB (ref 3.30–19.40)
Ig Lambda Free Light Chain: 14.33 mg/L (ref 5.71–26.30)
Kappa/Lambda FluidC Ratio: 1.81 — ABNORMAL HIGH (ref 0.26–1.65)

## 2016-04-06 MED ORDER — LENALIDOMIDE 10 MG PO CAPS: ORAL_CAPSULE | ORAL | Status: DC

## 2016-04-07 LAB — PROTEIN ELECTROPHORESIS, SERUM, WITH REFLEX
A/G RATIO SPE: 1.4 (ref 0.7–1.7)
Albumin: 3.8 g/dL (ref 2.9–4.4)
Alpha 1: 0.2 g/dL (ref 0.0–0.4)
Alpha 2: 0.4 g/dL (ref 0.4–1.0)
BETA: 0.8 g/dL (ref 0.7–1.3)
GAMMA GLOBULIN: 1.2 g/dL (ref 0.4–1.8)
GLOBULIN, TOTAL: 2.7 g/dL (ref 2.2–3.9)
Total Protein: 6.5 g/dL (ref 6.0–8.5)

## 2016-05-01 ENCOUNTER — Other Ambulatory Visit: Payer: Self-pay | Admitting: *Deleted

## 2016-05-01 DIAGNOSIS — C9000 Multiple myeloma not having achieved remission: Secondary | ICD-10-CM

## 2016-05-01 MED ORDER — LENALIDOMIDE 10 MG PO CAPS: ORAL_CAPSULE | ORAL | Status: DC

## 2016-05-27 MED FILL — ACYCLOVIR 400 MG TABLET: 400 | 30 days supply | Qty: 60 | Fill #2

## 2016-05-27 MED FILL — VIAGRA 100 MG TABLET: 100 | 30 days supply | Qty: 4 | Fill #0

## 2016-06-02 ENCOUNTER — Other Ambulatory Visit: Payer: Self-pay | Admitting: *Deleted

## 2016-06-02 DIAGNOSIS — C9000 Multiple myeloma not having achieved remission: Secondary | ICD-10-CM

## 2016-06-02 MED ORDER — LENALIDOMIDE 10 MG PO CAPS: ORAL_CAPSULE | ORAL | Status: DC

## 2016-06-08 ENCOUNTER — Telehealth: Payer: Self-pay | Admitting: *Deleted

## 2016-06-08 NOTE — Telephone Encounter (Signed)
PA re-certification initiated on [email or web address] Key: [id number] - PA Case ID: [id number] Approved effective 05/09/16 through 05/29/17

## 2016-06-26 ENCOUNTER — Encounter: Payer: Self-pay | Admitting: Family

## 2016-06-26 ENCOUNTER — Ambulatory Visit (HOSPITAL_BASED_OUTPATIENT_CLINIC_OR_DEPARTMENT_OTHER): Payer: 59

## 2016-06-26 ENCOUNTER — Ambulatory Visit (HOSPITAL_BASED_OUTPATIENT_CLINIC_OR_DEPARTMENT_OTHER): Payer: 59 | Admitting: Family

## 2016-06-26 ENCOUNTER — Other Ambulatory Visit (HOSPITAL_BASED_OUTPATIENT_CLINIC_OR_DEPARTMENT_OTHER): Payer: 59

## 2016-06-26 VITALS — BP 128/79 | HR 54 | Temp 97.9°F | Resp 18 | Ht 71.0 in | Wt 199.0 lb

## 2016-06-26 DIAGNOSIS — C9000 Multiple myeloma not having achieved remission: Secondary | ICD-10-CM

## 2016-06-26 DIAGNOSIS — E291 Testicular hypofunction: Secondary | ICD-10-CM

## 2016-06-26 DIAGNOSIS — Z9484 Stem cells transplant status: Secondary | ICD-10-CM | POA: Diagnosis not present

## 2016-06-26 LAB — CMP (CANCER CENTER ONLY)
ALK PHOS: 37 U/L (ref 26–84)
ALT: 31 U/L (ref 10–47)
AST: 20 U/L (ref 11–38)
Albumin: 3.6 g/dL (ref 3.3–5.5)
BILIRUBIN TOTAL: 1.1 mg/dL (ref 0.20–1.60)
BUN: 10 mg/dL (ref 7–22)
CALCIUM: 8.8 mg/dL (ref 8.0–10.3)
CO2: 26 meq/L (ref 18–33)
CREATININE: 1.3 mg/dL — AB (ref 0.6–1.2)
Chloride: 107 mEq/L (ref 98–108)
GLUCOSE: 95 mg/dL (ref 73–118)
Potassium: 3.9 mEq/L (ref 3.3–4.7)
SODIUM: 135 meq/L (ref 128–145)
Total Protein: 6.6 g/dL (ref 6.4–8.1)

## 2016-06-26 LAB — CBC WITH DIFFERENTIAL (CANCER CENTER ONLY)
BASO#: 0 10*3/uL (ref 0.0–0.2)
BASO%: 0.5 % (ref 0.0–2.0)
EOS ABS: 0.3 10*3/uL (ref 0.0–0.5)
EOS%: 8.8 % — ABNORMAL HIGH (ref 0.0–7.0)
HCT: 40.4 % (ref 38.7–49.9)
HGB: 14.6 g/dL (ref 13.0–17.1)
LYMPH#: 1.3 10*3/uL (ref 0.9–3.3)
LYMPH%: 35.1 % (ref 14.0–48.0)
MCH: 32.7 pg (ref 28.0–33.4)
MCHC: 36.1 g/dL — ABNORMAL HIGH (ref 32.0–35.9)
MCV: 90 fL (ref 82–98)
MONO#: 0.4 10*3/uL (ref 0.1–0.9)
MONO%: 12.1 % (ref 0.0–13.0)
NEUT#: 1.6 10*3/uL (ref 1.5–6.5)
NEUT%: 43.5 % (ref 40.0–80.0)
PLATELETS: 185 10*3/uL (ref 145–400)
RBC: 4.47 10*6/uL (ref 4.20–5.70)
RDW: 13 % (ref 11.1–15.7)
WBC: 3.7 10*3/uL — AB (ref 4.0–10.0)

## 2016-06-26 MED ORDER — ZOLEDRONIC ACID 4 MG/100ML IV SOLN
4.0000 mg | Freq: Once | INTRAVENOUS | Status: AC
  Administered 2016-06-26: 4 mg via INTRAVENOUS
  Filled 2016-06-26: qty 100

## 2016-06-26 MED FILL — VIAGRA 100 MG TABLET: 100 | 30 days supply | Qty: 4 | Fill #1

## 2016-06-26 NOTE — Patient Instructions (Signed)

## 2016-06-26 NOTE — Progress Notes (Signed)
Hematology and Oncology Follow Up Visit  Jimmy Day [facility] [id number] 09/07/1961 55 y.o. 06/26/2016   Principle Diagnosis:  IgG Kappa myeloma  Current Therapy:   Revlimid 10 mg daily (21/7) Zometa 4 mg IV every 3 months  Status post autologous stem cell transplant (April 2016)    Interim History:  Jimmy Day is here today for a follow-up. He continues to do well on Revlimid and is asymptomatic at this time.  In May, no M spike was detected and kappa light chain was 25.93 mg/L. He is still followed buy [facility] and will go there again in October.  No fever, chills, n/v, cough, rash, dizziness, headaches, vision changes, SOB, chest pain, palpitations, abdominal pain or changes in bowel or bladder habits.  No lymphadenopathy on exam. No episodes of bleeding or bruising.  No swelling, tenderness, numbness or tingling in his extremities. No c/o joint aches or "bone" pain. He is exercising and staying active. His appetite is good and he is staying well hydrated. His weight is stable.   Medications:    Medication List       Accurate as of 06/26/16  9:45 AM. Always use your most recent med list.          acyclovir 400 MG tablet Commonly known as:  ZOVIRAX Take 1 tablet (400 mg total) by mouth 2 (two) times daily.   amLODipine 5 MG tablet Commonly known as:  NORVASC TAKE 1 TABLET DAILY   lenalidomide 10 MG capsule Commonly known as:  REVLIMID Daily for 21 days then 7 days off. Auth# [id number]   sildenafil 100 MG tablet Commonly known as:  VIAGRA Take 1 tablet (100 mg total) by mouth daily as needed for erectile dysfunction.   Vitamin D (Ergocalciferol) 50000 units Caps capsule Commonly known as:  DRISDOL       Allergies: No Known Allergies  Past Medical History, Surgical history, Social history, and Family History were reviewed and updated.  Review of Systems: All other 10 point review of systems is negative.   Physical Exam:  height is 5\' 11"  (1.803 m) and weight is 199 lb  (90.3 kg). His oral temperature is 97.9 F (36.6 C). His blood pressure is 128/79 and his pulse is 54 (abnormal). His respiration is 18.   Wt Readings from Last 3 Encounters:  06/26/16 199 lb (90.3 kg)  04/03/16 201 lb (91.2 kg)  02/28/16 200 lb 2 oz (90.8 kg)    Ocular: Sclerae unicteric, pupils equal, round and reactive to light Ear-nose-throat: Oropharynx clear, dentition fair Lymphatic: No cervical supraclavicular or axillary adenopathy Lungs no rales or rhonchi, good excursion bilaterally Heart regular rate and rhythm, no murmur appreciated Abd soft, nontender, positive bowel sounds, no liver or spleen tip palpated on exam, no fluid wave MSK no focal spinal tenderness, no joint edema Neuro: non-focal, well-oriented, appropriate affect Breast: Deferred  Lab Results  Component Value Date   WBC 3.7 (L) 06/26/2016   HGB 14.6 06/26/2016   HCT 40.4 06/26/2016   MCV 90 06/26/2016   PLT 185 06/26/2016   Lab Results  Component Value Date   FERRITIN 152 08/17/2014   IRON 106 08/17/2014   TIBC 253 08/17/2014   UIBC 146 08/17/2014   IRONPCTSAT 42 08/17/2014   Lab Results  Component Value Date   RBC 4.47 06/26/2016   Lab Results  Component Value Date   KPAFRELGTCHN 2.34 (H) 10/04/2015   LAMBDASER 1.29 10/04/2015   KAPLAMBRATIO 1.81 (H) 04/03/2016   Lab Results  Component  Value Date   IGGSERUM 1,225 04/03/2016   IGA 23 (L) 10/04/2015   IGMSERUM 27 04/03/2016   Lab Results  Component Value Date   TOTALPROTELP 6.8 10/04/2015   ALBUMINELP 4.3 10/04/2015   A1GS 0.4 (H) 10/04/2015   A2GS 0.5 10/04/2015   BETS 0.4 10/04/2015   BETA2SER 0.2 10/04/2015   GAMS 1.0 10/04/2015   MSPIKE Not Observed 04/03/2016   SPEI * 10/04/2015     Chemistry      Component Value Date/Time   NA 135 06/26/2016 0858   NA 142 04/01/2015 0834   K 3.9 06/26/2016 0858   K 3.4 (L) 04/01/2015 0834   CL 107 06/26/2016 0858   CO2 26 06/26/2016 0858   CO2 20 (L) 04/01/2015 0834   BUN 10  06/26/2016 0858   BUN 10.4 04/01/2015 0834   CREATININE 1.3 (H) 06/26/2016 0858   CREATININE 1.2 04/01/2015 0834      Component Value Date/Time   CALCIUM 8.8 06/26/2016 0858   CALCIUM 8.6 04/01/2015 0834   ALKPHOS 37 06/26/2016 0858   ALKPHOS 39 (L) 04/01/2015 0834   AST 20 06/26/2016 0858   AST 17 04/01/2015 0834   ALT 31 06/26/2016 0858   ALT 19 04/01/2015 0834   BILITOT 1.10 06/26/2016 0858   BILITOT 0.56 04/01/2015 0834     Impression and Plan: Jimmy Day is 55 year old gentleman with IgG kappa myeloma s/p autologous stem cell transplant in April 2016. So far, there has been no evidence of recurrence. He is asymptomatic at this time and has no complaints. No M-spike detected in May. Myeloma studies today are pending.  He will continue on maintenance Revlimid and will receive his Zometa infusion today.  We will plan to see him back in 3 months for labs, follow-up and infusion.  He will contact us with any questions or concerns. We can certainly see him sooner if need be.   Eliezer Bottom, NP 8/4/20179:45 AM

## 2016-06-27 LAB — IGG, IGA, IGM
IGA/IMMUNOGLOBULIN A, SERUM: 16 mg/dL — AB (ref 90–386)
IGM (IMMUNOGLOBIN M), SRM: 31 mg/dL (ref 20–172)
IgG, Qn, Serum: 1213 mg/dL (ref 700–1600)

## 2016-06-29 LAB — KAPPA/LAMBDA LIGHT CHAINS
IG LAMBDA FREE LIGHT CHAIN: 14.2 mg/L (ref 5.7–26.3)
Ig Kappa Free Light Chain: 23.9 mg/L — ABNORMAL HIGH (ref 3.3–19.4)
KAPPA/LAMBDA FLC RATIO: 1.68 — AB (ref 0.26–1.65)

## 2016-06-30 LAB — PROTEIN ELECTROPHORESIS, SERUM, WITH REFLEX
A/G RATIO SPE: 1.5 (ref 0.7–1.7)
Albumin: 4 g/dL (ref 2.9–4.4)
Alpha 1: 0.2 g/dL (ref 0.0–0.4)
Alpha 2: 0.4 g/dL (ref 0.4–1.0)
Beta: 0.8 g/dL (ref 0.7–1.3)
GLOBULIN, TOTAL: 2.6 g/dL (ref 2.2–3.9)
Gamma Globulin: 1.2 g/dL (ref 0.4–1.8)
TOTAL PROTEIN: 6.6 g/dL (ref 6.0–8.5)

## 2016-07-01 ENCOUNTER — Other Ambulatory Visit: Payer: Self-pay

## 2016-07-01 DIAGNOSIS — C9000 Multiple myeloma not having achieved remission: Secondary | ICD-10-CM

## 2016-07-01 MED ORDER — LENALIDOMIDE 10 MG PO CAPS: ORAL_CAPSULE | ORAL | 0 refills | Status: DC

## 2016-07-23 ENCOUNTER — Other Ambulatory Visit: Payer: Self-pay | Admitting: Family

## 2016-08-03 ENCOUNTER — Other Ambulatory Visit: Payer: Self-pay | Admitting: Nurse Practitioner

## 2016-08-03 DIAGNOSIS — C9000 Multiple myeloma not having achieved remission: Secondary | ICD-10-CM

## 2016-08-03 MED ORDER — LENALIDOMIDE 10 MG PO CAPS: ORAL_CAPSULE | ORAL | 0 refills | Status: DC

## 2016-08-07 ENCOUNTER — Other Ambulatory Visit: Payer: Self-pay | Admitting: Hematology & Oncology

## 2016-08-07 MED FILL — ACYCLOVIR 400 MG TABLET: 400 | 30 days supply | Qty: 60 | Fill #1

## 2016-08-07 MED FILL — VIAGRA 100 MG TABLET: 100 | 30 days supply | Qty: 4 | Fill #2

## 2016-08-20 ENCOUNTER — Other Ambulatory Visit: Payer: Self-pay | Admitting: *Deleted

## 2016-08-21 ENCOUNTER — Other Ambulatory Visit: Payer: 59

## 2016-08-26 ENCOUNTER — Other Ambulatory Visit: Payer: Self-pay | Admitting: *Deleted

## 2016-08-26 DIAGNOSIS — C9000 Multiple myeloma not having achieved remission: Secondary | ICD-10-CM

## 2016-08-26 MED ORDER — LENALIDOMIDE 10 MG PO CAPS: ORAL_CAPSULE | ORAL | 0 refills | Status: DC

## 2016-09-18 ENCOUNTER — Ambulatory Visit: Payer: 59 | Admitting: Family

## 2016-09-18 ENCOUNTER — Ambulatory Visit: Payer: 59

## 2016-09-18 ENCOUNTER — Other Ambulatory Visit: Payer: 59

## 2016-09-22 ENCOUNTER — Other Ambulatory Visit: Payer: Self-pay | Admitting: *Deleted

## 2016-09-22 DIAGNOSIS — C9000 Multiple myeloma not having achieved remission: Secondary | ICD-10-CM

## 2016-09-22 MED ORDER — LENALIDOMIDE 10 MG PO CAPS: ORAL_CAPSULE | ORAL | 0 refills | Status: DC

## 2016-09-25 ENCOUNTER — Ambulatory Visit (HOSPITAL_BASED_OUTPATIENT_CLINIC_OR_DEPARTMENT_OTHER): Payer: 59

## 2016-09-25 ENCOUNTER — Other Ambulatory Visit (HOSPITAL_BASED_OUTPATIENT_CLINIC_OR_DEPARTMENT_OTHER): Payer: 59

## 2016-09-25 ENCOUNTER — Ambulatory Visit (HOSPITAL_BASED_OUTPATIENT_CLINIC_OR_DEPARTMENT_OTHER): Payer: 59 | Admitting: Hematology & Oncology

## 2016-09-25 VITALS — BP 135/92 | HR 61 | Temp 97.5°F | Resp 20 | Wt 202.0 lb

## 2016-09-25 DIAGNOSIS — Z9484 Stem cells transplant status: Secondary | ICD-10-CM

## 2016-09-25 DIAGNOSIS — C9000 Multiple myeloma not having achieved remission: Secondary | ICD-10-CM

## 2016-09-25 DIAGNOSIS — C9001 Multiple myeloma in remission: Secondary | ICD-10-CM | POA: Diagnosis not present

## 2016-09-25 LAB — CBC WITH DIFFERENTIAL (CANCER CENTER ONLY)
BASO#: 0 10*3/uL (ref 0.0–0.2)
BASO%: 1.1 % (ref 0.0–2.0)
EOS ABS: 0.2 10*3/uL (ref 0.0–0.5)
EOS%: 6 % (ref 0.0–7.0)
HCT: 39.2 % (ref 38.7–49.9)
HGB: 14.3 g/dL (ref 13.0–17.1)
LYMPH#: 1.5 10*3/uL (ref 0.9–3.3)
LYMPH%: 42.6 % (ref 14.0–48.0)
MCH: 33 pg (ref 28.0–33.4)
MCHC: 36.5 g/dL — AB (ref 32.0–35.9)
MCV: 91 fL (ref 82–98)
MONO#: 0.4 10*3/uL (ref 0.1–0.9)
MONO%: 12.3 % (ref 0.0–13.0)
NEUT#: 1.3 10*3/uL — ABNORMAL LOW (ref 1.5–6.5)
NEUT%: 38 % — AB (ref 40.0–80.0)
PLATELETS: 186 10*3/uL (ref 145–400)
RBC: 4.33 10*6/uL (ref 4.20–5.70)
RDW: 13.3 % (ref 11.1–15.7)
WBC: 3.5 10*3/uL — AB (ref 4.0–10.0)

## 2016-09-25 LAB — CMP (CANCER CENTER ONLY)
ALT(SGPT): 26 U/L (ref 10–47)
AST: 23 U/L (ref 11–38)
Albumin: 3.9 g/dL (ref 3.3–5.5)
Alkaline Phosphatase: 37 U/L (ref 26–84)
BUN: 8 mg/dL (ref 7–22)
CHLORIDE: 105 meq/L (ref 98–108)
CO2: 26 meq/L (ref 18–33)
CREATININE: 1.2 mg/dL (ref 0.6–1.2)
Calcium: 8.9 mg/dL (ref 8.0–10.3)
GLUCOSE: 100 mg/dL (ref 73–118)
Potassium: 3.7 mEq/L (ref 3.3–4.7)
SODIUM: 136 meq/L (ref 128–145)
TOTAL PROTEIN: 6.6 g/dL (ref 6.4–8.1)
Total Bilirubin: 1.1 mg/dl (ref 0.20–1.60)

## 2016-09-25 MED ORDER — SODIUM CHLORIDE 0.9 % IV SOLN
INTRAVENOUS | Status: DC
  Administered 2016-09-25: 10:00:00 via INTRAVENOUS

## 2016-09-25 MED ORDER — ZOLEDRONIC ACID 4 MG/100ML IV SOLN
4.0000 mg | Freq: Once | INTRAVENOUS | Status: AC
  Administered 2016-09-25: 4 mg via INTRAVENOUS
  Filled 2016-09-25: qty 100

## 2016-09-25 NOTE — Patient Instructions (Signed)

## 2016-09-25 NOTE — Progress Notes (Signed)
Hematology and Oncology Follow Up Visit  Purvis Croswell [facility] [id number] [birth year] 55 y.o. 09/25/2016   Principle Diagnosis:  IgG Kappa myeloma  Current Therapy:   Revlimid 10 mg daily (21/7) Zometa 4 mg IV every 3 months  Status post autologous stem cell transplant (April 2016)    Interim History:  Mr. Hewlett is here today for a follow-up. He continues to do well on Revlimid and is asymptomatic at this time.  In August, no M spike was detected and kappa light chain was 23.9  mg/L. He is still followed buy [facility] and will go there again in January.   He is still working. He works quite a bit. He is exercising. He played pickup basketball yesterday.  No fever, chills, n/v, cough, rash, dizziness, headaches, vision changes, SOB, chest pain, palpitations, abdominal pain or changes in bowel or bladder habits.   No lymphadenopathy on exam. No episodes of bleeding or bruising.  No swelling, tenderness, numbness or tingling in his extremities. No c/o joint aches or "bone" pain.  Currently, his performance status is ECOG 0.   Medications:    Medication List       Accurate as of 09/25/16 11:07 AM. Always use your most recent med list.          acyclovir 400 MG tablet Commonly known as:  ZOVIRAX Take 1 tablet (400 mg total) by mouth 2 (two) times daily.   amLODipine 5 MG tablet Commonly known as:  NORVASC TAKE 1 TABLET DAILY   lenalidomide 10 MG capsule Commonly known as:  REVLIMID Daily for 21 days then 7 days off. [id number]   sildenafil 100 MG tablet Commonly known as:  VIAGRA Take 1 tablet (100 mg total) by mouth daily as needed for erectile dysfunction.       Allergies: No Known Allergies  Past Medical History, Surgical history, Social history, and Family History were reviewed and updated.  Review of Systems: All other 10 point review of systems is negative.   Physical Exam:  weight is 202 lb (91.6 kg). His oral temperature is 97.5 F (36.4 C). His blood pressure is  135/92 (abnormal) and his pulse is 61. His respiration is 20.   Wt Readings from Last 3 Encounters:  09/25/16 202 lb (91.6 kg)  06/26/16 199 lb (90.3 kg)  04/03/16 201 lb (91.2 kg)    Ocular: Sclerae unicteric, pupils equal, round and reactive to light Ear-nose-throat: Oropharynx clear, dentition fair Lymphatic: No cervical supraclavicular or axillary adenopathy Lungs no rales or rhonchi, good excursion bilaterally Heart regular rate and rhythm, no murmur appreciated Abd soft, nontender, positive bowel sounds, no liver or spleen tip palpated on exam, no fluid wave MSK no focal spinal tenderness, no joint edema Neuro: non-focal, well-oriented, appropriate affect Breast: Deferred  Lab Results  Component Value Date   WBC 3.5 (L) 09/25/2016   HGB 14.3 09/25/2016   HCT 39.2 09/25/2016   MCV 91 09/25/2016   PLT 186 09/25/2016   Lab Results  Component Value Date   FERRITIN 152 08/17/2014   IRON 106 08/17/2014   TIBC 253 08/17/2014   UIBC 146 08/17/2014   IRONPCTSAT 42 08/17/2014   Lab Results  Component Value Date   RBC 4.33 09/25/2016   Lab Results  Component Value Date   KPAFRELGTCHN 2.34 (H) 10/04/2015   LAMBDASER 1.29 10/04/2015   KAPLAMBRATIO 1.68 (H) 06/26/2016   Lab Results  Component Value Date   IGGSERUM 1,213 06/26/2016   IGA 23 (L) 10/04/2015   IGMSERUM  31 06/26/2016   Lab Results  Component Value Date   TOTALPROTELP 6.8 10/04/2015   ALBUMINELP 4.3 10/04/2015   A1GS 0.4 (H) 10/04/2015   A2GS 0.5 10/04/2015   BETS 0.4 10/04/2015   BETA2SER 0.2 10/04/2015   GAMS 1.0 10/04/2015   MSPIKE Not Observed 06/26/2016   SPEI * 10/04/2015     Chemistry      Component Value Date/Time   NA 136 09/25/2016 0903   NA 142 04/01/2015 0834   K 3.7 09/25/2016 0903   K 3.4 (L) 04/01/2015 0834   CL 105 09/25/2016 0903   CO2 26 09/25/2016 0903   CO2 20 (L) 04/01/2015 0834   BUN 8 09/25/2016 0903   BUN 10.4 04/01/2015 0834   CREATININE 1.2 09/25/2016 0903    CREATININE 1.2 04/01/2015 0834      Component Value Date/Time   CALCIUM 8.9 09/25/2016 0903   CALCIUM 8.6 04/01/2015 0834   ALKPHOS 37 09/25/2016 0903   ALKPHOS 39 (L) 04/01/2015 0834   AST 23 09/25/2016 0903   AST 17 04/01/2015 0834   ALT 26 09/25/2016 0903   ALT 19 04/01/2015 0834   BILITOT 1.10 09/25/2016 0903   BILITOT 0.56 04/01/2015 0834     Impression and Plan: Mr. Delon is 55 year old gentleman with IgG kappa myeloma s/p autologous stem cell transplant in April 2016. So far, there has been no evidence of recurrence. He is asymptomatic at this time and has no complaints. No M-spike detected in August. Myeloma studies today are pending.   He will continue on maintenance Revlimid and will receive his Zometa infusion today.   We will plan to see him back in 3 months for labs, follow-up and infusion.   He will contact us with any questions or concerns. We can certainly see him sooner if need be.   [name], MD 11/3/201711:07 AM

## 2016-09-28 LAB — KAPPA/LAMBDA LIGHT CHAINS
IG KAPPA FREE LIGHT CHAIN: 17.8 mg/L (ref 3.3–19.4)
IG LAMBDA FREE LIGHT CHAIN: 11.8 mg/L (ref 5.7–26.3)
KAPPA/LAMBDA FLC RATIO: 1.51 (ref 0.26–1.65)

## 2016-09-29 LAB — MULTIPLE MYELOMA PANEL, SERUM
ALBUMIN SERPL ELPH-MCNC: 3.9 g/dL (ref 2.9–4.4)
ALPHA 1: 0.1 g/dL (ref 0.0–0.4)
Albumin/Glob SerPl: 1.5 (ref 0.7–1.7)
Alpha2 Glob SerPl Elph-Mcnc: 0.4 g/dL (ref 0.4–1.0)
B-Globulin SerPl Elph-Mcnc: 0.8 g/dL (ref 0.7–1.3)
GAMMA GLOB SERPL ELPH-MCNC: 1.3 g/dL (ref 0.4–1.8)
GLOBULIN, TOTAL: 2.7 g/dL (ref 2.2–3.9)
IGA/IMMUNOGLOBULIN A, SERUM: 16 mg/dL — AB (ref 90–386)
IgG, Qn, Serum: 1360 mg/dL (ref 700–1600)
IgM, Qn, Serum: 22 mg/dL (ref 20–172)
TOTAL PROTEIN: 6.6 g/dL (ref 6.0–8.5)

## 2016-10-20 ENCOUNTER — Other Ambulatory Visit: Payer: Self-pay | Admitting: *Deleted

## 2016-10-20 DIAGNOSIS — C9000 Multiple myeloma not having achieved remission: Secondary | ICD-10-CM

## 2016-10-20 MED ORDER — LENALIDOMIDE 10 MG PO CAPS: ORAL_CAPSULE | ORAL | 0 refills | Status: DC

## 2016-11-17 ENCOUNTER — Other Ambulatory Visit: Payer: Self-pay | Admitting: *Deleted

## 2016-11-17 DIAGNOSIS — C9000 Multiple myeloma not having achieved remission: Secondary | ICD-10-CM

## 2016-11-17 MED ORDER — LENALIDOMIDE 10 MG PO CAPS: ORAL_CAPSULE | ORAL | 0 refills | Status: DC

## 2016-11-25 ENCOUNTER — Other Ambulatory Visit: Payer: Self-pay | Admitting: *Deleted

## 2016-11-25 DIAGNOSIS — C9000 Multiple myeloma not having achieved remission: Secondary | ICD-10-CM

## 2016-11-25 MED ORDER — LENALIDOMIDE 10 MG PO CAPS: ORAL_CAPSULE | ORAL | 0 refills | Status: DC

## 2016-12-15 ENCOUNTER — Other Ambulatory Visit: Payer: Self-pay | Admitting: *Deleted

## 2016-12-15 DIAGNOSIS — C9000 Multiple myeloma not having achieved remission: Secondary | ICD-10-CM

## 2016-12-15 MED ORDER — LENALIDOMIDE 10 MG PO CAPS: ORAL_CAPSULE | ORAL | 0 refills | Status: DC

## 2016-12-25 ENCOUNTER — Ambulatory Visit: Payer: 59

## 2016-12-25 ENCOUNTER — Other Ambulatory Visit: Payer: 59

## 2016-12-25 ENCOUNTER — Ambulatory Visit: Payer: 59 | Admitting: Family

## 2017-01-05 ENCOUNTER — Other Ambulatory Visit: Payer: Self-pay | Admitting: *Deleted

## 2017-01-05 DIAGNOSIS — C9000 Multiple myeloma not having achieved remission: Secondary | ICD-10-CM

## 2017-01-05 MED ORDER — LENALIDOMIDE 10 MG PO CAPS
ORAL_CAPSULE | ORAL | 0 refills | Status: DC
Start: 2017-01-05 — End: 2017-01-29

## 2017-01-19 ENCOUNTER — Other Ambulatory Visit: Payer: Self-pay | Admitting: Family

## 2017-01-19 MED ORDER — AMLODIPINE BESYLATE 5 MG PO TABS: 5.0000 mg | ORAL_TABLET | Freq: Every day | ORAL | 0 refills | Status: DC

## 2017-01-29 ENCOUNTER — Other Ambulatory Visit: Payer: Self-pay | Admitting: *Deleted

## 2017-01-29 DIAGNOSIS — C9000 Multiple myeloma not having achieved remission: Secondary | ICD-10-CM

## 2017-01-29 MED ORDER — LENALIDOMIDE 10 MG PO CAPS: ORAL_CAPSULE | ORAL | 0 refills | Status: DC

## 2017-02-03 NOTE — Telephone Encounter (Signed)
Pt has not read mychart message. Mailed letter to pt.

## 2017-02-10 ENCOUNTER — Other Ambulatory Visit: Payer: Self-pay | Admitting: Family

## 2017-02-10 DIAGNOSIS — E559 Vitamin D deficiency, unspecified: Secondary | ICD-10-CM

## 2017-02-11 ENCOUNTER — Other Ambulatory Visit (HOSPITAL_BASED_OUTPATIENT_CLINIC_OR_DEPARTMENT_OTHER): Payer: 59

## 2017-02-11 ENCOUNTER — Encounter: Payer: Self-pay | Admitting: Hematology & Oncology

## 2017-02-11 ENCOUNTER — Ambulatory Visit (HOSPITAL_BASED_OUTPATIENT_CLINIC_OR_DEPARTMENT_OTHER): Payer: 59 | Admitting: Hematology & Oncology

## 2017-02-11 ENCOUNTER — Ambulatory Visit (HOSPITAL_BASED_OUTPATIENT_CLINIC_OR_DEPARTMENT_OTHER): Payer: 59

## 2017-02-11 VITALS — BP 128/74 | HR 54 | Temp 97.7°F | Resp 16 | Wt 201.4 lb

## 2017-02-11 DIAGNOSIS — Z9484 Stem cells transplant status: Secondary | ICD-10-CM | POA: Diagnosis not present

## 2017-02-11 DIAGNOSIS — C9001 Multiple myeloma in remission: Secondary | ICD-10-CM

## 2017-02-11 DIAGNOSIS — C9000 Multiple myeloma not having achieved remission: Secondary | ICD-10-CM

## 2017-02-11 LAB — CBC WITH DIFFERENTIAL (CANCER CENTER ONLY)
BASO#: 0 10*3/uL (ref 0.0–0.2)
BASO%: 1.1 % (ref 0.0–2.0)
EOS ABS: 0.2 10*3/uL (ref 0.0–0.5)
EOS%: 5.9 % (ref 0.0–7.0)
HCT: 39.4 % (ref 38.7–49.9)
HEMOGLOBIN: 14.3 g/dL (ref 13.0–17.1)
LYMPH#: 1.7 10*3/uL (ref 0.9–3.3)
LYMPH%: 47.1 % (ref 14.0–48.0)
MCH: 32.4 pg (ref 28.0–33.4)
MCHC: 36.3 g/dL — AB (ref 32.0–35.9)
MCV: 89 fL (ref 82–98)
MONO#: 0.4 10*3/uL (ref 0.1–0.9)
MONO%: 12.3 % (ref 0.0–13.0)
NEUT%: 33.6 % — ABNORMAL LOW (ref 40.0–80.0)
NEUTROS ABS: 1.2 10*3/uL — AB (ref 1.5–6.5)
PLATELETS: 191 10*3/uL (ref 145–400)
RBC: 4.41 10*6/uL (ref 4.20–5.70)
RDW: 12.8 % (ref 11.1–15.7)
WBC: 3.6 10*3/uL — AB (ref 4.0–10.0)

## 2017-02-11 LAB — CMP (CANCER CENTER ONLY)
ALBUMIN: 3.7 g/dL (ref 3.3–5.5)
ALT(SGPT): 23 U/L (ref 10–47)
AST: 23 U/L (ref 11–38)
Alkaline Phosphatase: 42 U/L (ref 26–84)
BILIRUBIN TOTAL: 1.1 mg/dL (ref 0.20–1.60)
BUN: 10 mg/dL (ref 7–22)
CHLORIDE: 105 meq/L (ref 98–108)
CO2: 26 meq/L (ref 18–33)
CREATININE: 1.2 mg/dL (ref 0.6–1.2)
Calcium: 9.3 mg/dL (ref 8.0–10.3)
Glucose, Bld: 104 mg/dL (ref 73–118)
Potassium: 3.7 mEq/L (ref 3.3–4.7)
SODIUM: 140 meq/L (ref 128–145)
Total Protein: 6.7 g/dL (ref 6.4–8.1)

## 2017-02-11 MED ORDER — ALTEPLASE 2 MG IJ SOLR
2.0000 mg | Freq: Once | INTRAMUSCULAR | Status: DC | PRN
  Filled 2017-02-11: qty 2

## 2017-02-11 MED ORDER — SODIUM CHLORIDE 0.9 % IJ SOLN
3.0000 mL | Freq: Once | INTRAMUSCULAR | Status: DC | PRN
  Filled 2017-02-11: qty 10

## 2017-02-11 MED ORDER — ZOLEDRONIC ACID 4 MG/100ML IV SOLN
4.0000 mg | Freq: Once | INTRAVENOUS | Status: AC
  Administered 2017-02-11: 4 mg via INTRAVENOUS
  Filled 2017-02-11: qty 100

## 2017-02-11 MED ORDER — SODIUM CHLORIDE 0.9 % IJ SOLN
10.0000 mL | INTRAMUSCULAR | Status: DC | PRN
  Filled 2017-02-11: qty 10

## 2017-02-11 MED ORDER — HEPARIN SOD (PORK) LOCK FLUSH 100 UNIT/ML IV SOLN
250.0000 [IU] | Freq: Once | INTRAVENOUS | Status: DC | PRN
  Filled 2017-02-11: qty 5

## 2017-02-11 MED ORDER — HEPARIN SOD (PORK) LOCK FLUSH 100 UNIT/ML IV SOLN
500.0000 [IU] | Freq: Once | INTRAVENOUS | Status: DC | PRN
  Filled 2017-02-11: qty 5

## 2017-02-11 NOTE — Progress Notes (Signed)
Hematology and Oncology Follow Up Visit  Jimmy Day [facility] [id number] 1961/08/18 56 y.o. 02/11/2017   Principle Diagnosis:  IgG Kappa myeloma  Current Therapy:   Revlimid 10 mg daily (21/7) Zometa 4 mg IV every 3 months  Status post autologous stem cell transplant (April 2016)    Interim History:  Jimmy Day is here today for a follow-up. He continues to do well on Revlimid and is asymptomatic at this time.   We last saw him back in November, there is no monoclonal spike in his blood. His IgG level was 1360 mg/dL. His Kappa Light chain was 1.8 mg/dL.  He is still working. He drives a truck.  He and his wife are planning on going to [facility] in a week or so. He will be out there for about a week or so. From [place], they will be going to [facility].  He sees the transplant doctors at [facility] in 2 or 3 weeks. It will be 2 years in April that he had his stem cell transplant.  Now that Delton See is approved for myeloma, we might want to consider getting him on Xgeva as bone therapy.  He is not complaining of any bony pain. There is no back pain. He's had no change in bowel or bladder habits. He's had no infections. He's had no fever. He's had no rashes. He's had no leg swelling.  Currently, his performance status is ECOG 0.   Medications:  Allergies as of 02/11/2017   No Known Allergies     Medication List       Accurate as of 02/11/17 12:57 PM. Always use your most recent med list.          acyclovir 400 MG tablet Commonly known as:  ZOVIRAX Take 1 tablet (400 mg total) by mouth 2 (two) times daily.   amLODipine 5 MG tablet Commonly known as:  NORVASC Take 1 tablet (5 mg total) by mouth daily.   lenalidomide 10 MG capsule Commonly known as:  REVLIMID Daily for 21 days then 7 days off. [id number]   sildenafil 100 MG tablet Commonly known as:  VIAGRA Take 1 tablet (100 mg total) by mouth daily as needed for erectile dysfunction.       Allergies: No Known Allergies  Past  Medical History, Surgical history, Social history, and Family History were reviewed and updated.  Review of Systems: All other 10 point review of systems is negative.   Physical Exam:  weight is 201 lb 6.4 oz (91.4 kg). His oral temperature is 97.7 F (36.5 C). His blood pressure is 128/74 and his pulse is 54 (abnormal). His respiration is 16 and oxygen saturation is 100%.   Wt Readings from Last 3 Encounters:  02/11/17 201 lb 6.4 oz (91.4 kg)  09/25/16 202 lb (91.6 kg)  06/26/16 199 lb (90.3 kg)    Ocular: Sclerae unicteric, pupils equal, round and reactive to light Ear-nose-throat: Oropharynx clear, dentition fair Lymphatic: No cervical supraclavicular or axillary adenopathy Lungs no rales or rhonchi, good excursion bilaterally Heart regular rate and rhythm, no murmur appreciated Abd soft, nontender, positive bowel sounds, no liver or spleen tip palpated on exam, no fluid wave MSK no focal spinal tenderness, no joint edema Neuro: non-focal, well-oriented, appropriate affect Breast: Deferred  Lab Results  Component Value Date   WBC 3.6 (L) 02/11/2017   HGB 14.3 02/11/2017   HCT 39.4 02/11/2017   MCV 89 02/11/2017   PLT 191 02/11/2017   Lab Results  Component  Value Date   FERRITIN 152 08/17/2014   IRON 106 08/17/2014   TIBC 253 08/17/2014   UIBC 146 08/17/2014   IRONPCTSAT 42 08/17/2014   Lab Results  Component Value Date   RBC 4.41 02/11/2017   Lab Results  Component Value Date   KPAFRELGTCHN 2.34 (H) 10/04/2015   LAMBDASER 1.29 10/04/2015   KAPLAMBRATIO 1.51 09/25/2016   Lab Results  Component Value Date   IGGSERUM 1,360 09/25/2016   IGA 23 (L) 10/04/2015   IGMSERUM 22 09/25/2016   Lab Results  Component Value Date   TOTALPROTELP 6.8 10/04/2015   ALBUMINELP 4.3 10/04/2015   A1GS 0.4 (H) 10/04/2015   A2GS 0.5 10/04/2015   BETS 0.4 10/04/2015   BETA2SER 0.2 10/04/2015   GAMS 1.0 10/04/2015   MSPIKE Not Observed 06/26/2016   SPEI * 10/04/2015      Chemistry      Component Value Date/Time   NA 140 02/11/2017 1143   NA 142 04/01/2015 0834   K 3.7 02/11/2017 1143   K 3.4 (L) 04/01/2015 0834   CL 105 02/11/2017 1143   CO2 26 02/11/2017 1143   CO2 20 (L) 04/01/2015 0834   BUN 10 02/11/2017 1143   BUN 10.4 04/01/2015 0834   CREATININE 1.2 02/11/2017 1143   CREATININE 1.2 04/01/2015 0834      Component Value Date/Time   CALCIUM 9.3 02/11/2017 1143   CALCIUM 8.6 04/01/2015 0834   ALKPHOS 42 02/11/2017 1143   ALKPHOS 39 (L) 04/01/2015 0834   AST 23 02/11/2017 1143   AST 17 04/01/2015 0834   ALT 23 02/11/2017 1143   ALT 19 04/01/2015 0834   BILITOT 1.10 02/11/2017 1143   BILITOT 0.56 04/01/2015 0834     Impression and Plan: Jimmy Day is 56 year old gentleman with IgG kappa myeloma s/p autologous stem cell transplant in April 2016. So far, there has been no evidence of recurrence. He is asymptomatic at this time and has no complaints. No M-spike detected in August. Myeloma studies today are pending.   He is planning on going to [facility] next week. He and his wife and a another couple will be going out there. From [facility], he is going to drive to [place]. He wants to see the [facility]. Hopefully, he will go to [facility]. He likes to work out so [employer] is where all the bodybuilders workout.  I think that if his M spike still was not found, we might be overdue it him down to 5 mg of Revlimid.  Now that Delton See is approved for myeloma, we might want to consider giving him Delton See which be a lot easier for him.  I will plan to see him back in 3 months.   [name], MD 3/22/201812:57 PM

## 2017-02-11 NOTE — Patient Instructions (Signed)

## 2017-02-12 ENCOUNTER — Other Ambulatory Visit: Payer: Self-pay | Admitting: *Deleted

## 2017-02-12 MED ORDER — DENOSUMAB 120 MG/1.7ML ~~LOC~~ SOLN: 120.0000 mg | SUBCUTANEOUS | 4 refills | Status: DC

## 2017-02-12 MED FILL — VIT D2 1.25 MG (50,000 UNIT: 1.25 MG | 28 days supply | Qty: 4 | Fill #0

## 2017-02-12 NOTE — Telephone Encounter (Signed)
1 month supply is sent, pt is due for follow up please.

## 2017-02-16 NOTE — Telephone Encounter (Signed)
lvm for pt to call back to schedule F/U with PCP.

## 2017-03-01 ENCOUNTER — Other Ambulatory Visit: Payer: Self-pay | Admitting: *Deleted

## 2017-03-01 ENCOUNTER — Ambulatory Visit (INDEPENDENT_AMBULATORY_CARE_PROVIDER_SITE_OTHER): Payer: 59 | Admitting: Family Medicine

## 2017-03-01 ENCOUNTER — Ambulatory Visit: Payer: 59 | Admitting: Family

## 2017-03-01 ENCOUNTER — Telehealth: Payer: Self-pay

## 2017-03-01 VITALS — BP 137/85 | HR 78 | Temp 98.4°F | Ht 71.0 in | Wt 191.2 lb

## 2017-03-01 DIAGNOSIS — B9689 Other specified bacterial agents as the cause of diseases classified elsewhere: Secondary | ICD-10-CM | POA: Diagnosis not present

## 2017-03-01 DIAGNOSIS — J208 Acute bronchitis due to other specified organisms: Secondary | ICD-10-CM | POA: Diagnosis not present

## 2017-03-01 DIAGNOSIS — C9000 Multiple myeloma not having achieved remission: Secondary | ICD-10-CM

## 2017-03-01 MED ORDER — BENZONATATE 100 MG PO CAPS: 100.0000 mg | ORAL_CAPSULE | Freq: Three times a day (TID) | ORAL | 0 refills | Status: DC | PRN

## 2017-03-01 MED ORDER — LENALIDOMIDE 10 MG PO CAPS: ORAL_CAPSULE | ORAL | 0 refills | Status: DC

## 2017-03-01 MED ORDER — AZITHROMYCIN 250 MG PO TABS: ORAL_TABLET | ORAL | 0 refills | Status: DC

## 2017-03-01 MED FILL — AZITHROMYCIN 250 MG TABLET: 250 | 5 days supply | Qty: 6 | Fill #0

## 2017-03-01 MED FILL — BENZONATATE 100 MG CAP: 100 | 10 days supply | Qty: 30 | Fill #0

## 2017-03-01 NOTE — Patient Instructions (Addendum)
Don't take the antibiotic (azithromycin) unless you start having fevers again.   This can last up to 3 weeks. Most causes are viral in nature.  Continue to push fluids, practice good hand hygiene, and cover your mouth if you cough.  If, despite the antibiotic, you start having fevers, shaking or shortness of breath, seek immediate care.

## 2017-03-01 NOTE — Progress Notes (Signed)
Chief Complaint  Patient presents with  . Cough    cough and congestion x 7 days     Ranell Patrick here for URI complaints.  Duration: 7 days  Associated symptoms: rhinorrhea, intermittently productive cough, and subjective fevers (stopped after Thursday- 3 days ago) Denies: sinus congestion, sinus pain, itchy watery eyes, ear pain, ear drainage, sore throat, shortness of breath, myalgia and rigors Treatment to date: Tylenol, Mucinex,  Sick contacts: No  ROS:  Const: Denies current fevers HEENT: As noted in HPI Lungs: No SOB  Past Medical History:  Diagnosis Date  . Fasting hyperglycemia    NORMAL A1c  . Hyperlipidemia   . Hypertension   . Kappa light chain myeloma ([place]) 02/02/2014  . Testosterone deficiency    Dr Hartley Barefoot   Family History  Problem Relation Age of Onset  . Breast cancer Mother   . Heart attack Father 74  . Kidney disease Brother     RENAL FAILURE  . Hypertension Brother   . Heart attack Maternal Aunt 65    CABG  . Stroke Maternal Uncle     CVA  . Diabetes Neg Hx   . Colon cancer Neg Hx   . Esophageal cancer Neg Hx   . Rectal cancer Neg Hx   . Stomach cancer Neg Hx     BP 137/85 (BP Location: Left Arm, Patient Position: Sitting, Cuff Size: Large)   Pulse 78   Temp 98.4 F (36.9 C) (Oral)   Ht 5\' 11"  (1.803 m)   Wt 191 lb 3.2 oz (86.7 kg)   BMI 26.67 kg/m  General: Awake, alert, appears stated age HEENT: AT, [place], ears patent b/l and TM's neg, nares patent w/o discharge, pharynx pink and without exudates, MMM Neck: No masses or asymmetry Heart: RRR, no murmurs, no bruits Lungs: CTAB, no accessory muscle use, coughs upon deep inspiration Psych: Age appropriate judgment and insight, normal mood and affect  Acute bacterial bronchitis - Plan: azithromycin (ZITHROMAX) 250 MG tablet, benzonatate (TESSALON) 100 MG capsule  Orders as above. Try supportive care. If fevers return, take. He may be turning the corner.  Continue to push fluids,  practice good hand hygiene, cover mouth when coughing. F/u prn. If, despite abx, starting to experience fevers, shaking, or shortness of breath, seek immediate care. Pt voiced understanding and agreement to the plan.  [place], DO 03/01/17 11:45 AM

## 2017-03-01 NOTE — Telephone Encounter (Signed)
Hx. Kappa light chain myeloma ([place])  Pt was scheduled to see Melissa today. Arrived late due to not feeling well.... Pt c/o cough, congestion, fatigue and mild shortness of breath.  States cough and congestions started last Monday and progressively worsen.  Has been taking mucous relief. Not really helpful.  Fever and chest pain yesterday.  None today.  Pt would like to be seen.  He also has an appt today in [place] for chemo treatment at 12:45p.     Lung clears, O2 Sat: 98%, HR: 78, BP;  137/85 Wt: 191.2, Ht: 5'11,    Provider completely booked.  Next available appt in office:  11:15 am today with Dr. Nani Ravens.    Discussed above with Melissa and Wendling. Wendling agreed to see patient.

## 2017-03-15 MED FILL — AMLODIPINE BESYLATE 5 MG TA: 5 | 30 days supply | Qty: 30 | Fill #0

## 2017-03-29 ENCOUNTER — Other Ambulatory Visit: Payer: Self-pay | Admitting: *Deleted

## 2017-03-29 DIAGNOSIS — C9000 Multiple myeloma not having achieved remission: Secondary | ICD-10-CM

## 2017-03-29 MED ORDER — LENALIDOMIDE 10 MG PO CAPS: ORAL_CAPSULE | ORAL | 0 refills | Status: DC

## 2017-04-20 ENCOUNTER — Telehealth: Payer: Self-pay | Admitting: Family

## 2017-04-20 MED ORDER — AMLODIPINE BESYLATE 5 MG PO TABS: 5.0000 mg | ORAL_TABLET | Freq: Every day | ORAL | 0 refills | Status: DC

## 2017-04-20 MED FILL — AMLODIPINE BESYLATE 5 MG TA: 5 | 30 days supply | Qty: 30 | Fill #0

## 2017-04-20 NOTE — Telephone Encounter (Signed)
Pt has not seen PCP since 02/2016. Seen for acute visit 03/01/17 by Dr Nani Ravens. Sent 30 day supply of amlodipine to pharmacy. Looks like last vitamin D level was 02/28/16. Please advise request?

## 2017-04-20 NOTE — Telephone Encounter (Signed)
My chart message sent to pt.

## 2017-04-20 NOTE — Telephone Encounter (Signed)
Needs OV please prior to additional vit D refills.

## 2017-04-20 NOTE — Telephone Encounter (Signed)
Caller name: Azazel Relation to pt: self Call back number:[phone] Pharmacy: [place], [place] [facility]  Reason for call: PT came in office requesting refill on Vitamin D, Ergocalciferol, (DRISDOL) 50000 units CAPS capsule and refill on amLODipine (NORVASC) 5 MG tablet. Please advise.

## 2017-04-21 ENCOUNTER — Other Ambulatory Visit: Payer: Self-pay | Admitting: Family

## 2017-04-21 DIAGNOSIS — E559 Vitamin D deficiency, unspecified: Secondary | ICD-10-CM

## 2017-04-22 ENCOUNTER — Telehealth: Payer: Self-pay | Admitting: Medical

## 2017-04-22 ENCOUNTER — Ambulatory Visit (INDEPENDENT_AMBULATORY_CARE_PROVIDER_SITE_OTHER): Payer: 59 | Admitting: Medical

## 2017-04-22 ENCOUNTER — Encounter: Payer: Self-pay | Admitting: Medical

## 2017-04-22 VITALS — BP 144/87 | HR 67 | Temp 98.4°F | Resp 16 | Ht 71.0 in | Wt 200.4 lb

## 2017-04-22 DIAGNOSIS — R5383 Other fatigue: Secondary | ICD-10-CM | POA: Diagnosis not present

## 2017-04-22 DIAGNOSIS — R7989 Other specified abnormal findings of blood chemistry: Secondary | ICD-10-CM

## 2017-04-22 DIAGNOSIS — E559 Vitamin D deficiency, unspecified: Secondary | ICD-10-CM

## 2017-04-22 DIAGNOSIS — R319 Hematuria, unspecified: Secondary | ICD-10-CM

## 2017-04-22 NOTE — Telephone Encounter (Signed)
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 So I am putting in future order urine. Let pt know when he comes in to see Dr Marin Olp. He could come to our lab for repeat urine. Could you get him on lab schedule?

## 2017-04-22 NOTE — Telephone Encounter (Signed)
Rx denied.  Pt was seen for office visit today with Jimmy Day and labs were drawn.//AB/CMA

## 2017-04-22 NOTE — Progress Notes (Signed)
Subjective:    Patient ID: Jimmy Day, male    DOB: 11/20/1961, 56 y.o.   MRN: [id number]  HPI  Pt in for follow up on vitamin D level. Pt ran out of vitamin d tabs about one week ago. He had been taking 50,000 units weekly. Last checked vit d one year ago.  Pt does report some  Mild fatigue. He states energy level seemed low after having diarrhea for about one week. Now 2 weeks free from diarrhea. He never vomited. But did have chills early on.  Recently he has stopped working out since he had the diarrhea. Energy not back to his normal baseline.      Review of Systems  Constitutional: Positive for fatigue. Negative for chills and fever.  HENT: Negative for congestion.   Respiratory: Negative for apnea, cough, shortness of breath and wheezing.   Cardiovascular: Negative for chest pain and palpitations.  Gastrointestinal: Negative for abdominal pain, constipation, diarrhea and vomiting.       2 weeks ago diarrhea.  Genitourinary: Negative for dysuria, flank pain, frequency, penile swelling, testicular pain and urgency.  Musculoskeletal: Negative for back pain and neck pain.  Skin: Negative for rash.  Neurological: Negative for dizziness, seizures, syncope, facial asymmetry, weakness and headaches.  Hematological: Negative for adenopathy. Does not bruise/bleed easily.  Psychiatric/Behavioral: Negative for behavioral problems and confusion. The patient is not nervous/anxious.    Past Medical History:  Diagnosis Date  . Fasting hyperglycemia    NORMAL A1c  . Hyperlipidemia   . Hypertension   . Kappa light chain myeloma ([place]) 02/02/2014  . Testosterone deficiency    Dr Hartley Barefoot     Social History   Social History  . Marital status: Married    Spouse name: N/A  . Number of children: 3  . Years of education: N/A   Occupational History  . Truck Driver [employer]   Social History Main Topics  . Smoking status: Never Smoker  . Smokeless tobacco: Never Used   Comment: never used tobacco  . Alcohol use 0.0 oz/week     Comment:  rarely  . Drug use: No  . Sexual activity: Yes    Birth control/ protection: Condom   Other Topics Concern  . 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 CERVICAL PLATE INSERTION     POST COMPRESSION DISC INJURY  . LIPOMAS REMOVAL     BENIGN FROM CHEST AND BACK    Family History  Problem Relation Age of Onset  . Breast cancer Mother   . Heart attack Father 6  . Kidney disease Brother        RENAL FAILURE  . Hypertension Brother   . Heart attack Maternal Aunt 65       CABG  . Stroke Maternal Uncle        CVA  . Diabetes Neg Hx   . Colon cancer Neg Hx   . Esophageal cancer Neg Hx   . Rectal cancer Neg Hx   . Stomach cancer Neg Hx     No Known Allergies  Current Outpatient Prescriptions on File Prior to Visit  Medication Sig Dispense Refill  . acyclovir (  ZOVIRAX) 400 MG tablet Take 1 tablet (400 mg total) by mouth 2 (two) times daily. 90 tablet 1  . amLODipine (NORVASC) 5 MG tablet Take 1 tablet (5 mg total) by mouth daily. 30 tablet 0  . azithromycin (ZITHROMAX) 250 MG tablet Take 2 tabs the first day and then 1 tab daily until you run out. 6 tablet 0  . benzonatate (TESSALON) 100 MG capsule Take 1 capsule (100 mg total) by mouth 3 (three) times daily as needed. 30 capsule 0  . denosumab (XGEVA) 120 MG/1.7ML SOLN injection Inject 1.7 mLs (120 mg total) into the skin every 3 (three) months. 1.7 mL 4  . lenalidomide (REVLIMID) 10 MG capsule Daily for 21 days then 7 days off. [id number] 21 capsule 0  . sildenafil (VIAGRA) 100 MG tablet Take 1 tablet (100 mg total) by mouth daily as needed for erectile dysfunction. 4 tablet 5  . Vitamin D, Ergocalciferol,  (DRISDOL) 50000 units CAPS capsule TAKE ONE CAPSULE BY MOUTH EVERY 7 DAYS 4 capsule 0   No current facility-administered medications on file prior to visit.     BP (!) 144/87 (BP Location: Left Arm, Patient Position: Sitting, Cuff Size: Large)   Pulse 67   Temp 98.4 F (36.9 C) (Oral)   Resp 16   Ht 5\' 11"  (1.803 m)   Wt 200 lb 6.4 oz (90.9 kg)   SpO2 100%   BMI 27.95 kg/m       Objective:   Physical Exam  General Appearance- Not in acute distress.  HEENT Eyes- Scleraeral/Conjuntiva-bilat- Not Yellow. Mouth & Throat- Normal.  Chest and Lung Exam Auscultation: Breath sounds:-Normal. Adventitious sounds:- No Adventitious sounds.  Cardiovascular Auscultation:Rythm - Regular. Heart Sounds -Normal heart sounds.  Abdomen Inspection:-Inspection Normal.  Palpation/Perucssion: Palpation and Percussion of the abdomen reveal- Non Tender, No Rebound tenderness, No rigidity(Guarding) and No Palpable abdominal masses.  Liver:-Normal.  Spleen:- Normal.   Back- no cva tenderness.  Lymphatic exam- no abnormality palpated.     Assessment & Plan:  For your low vitamin d history will get vitamin d level today. With your recent reported fatigue will also get cbc, cmp, tsh, b12 and b1..  We will let you know results of labs when they come in.  Follow date to be determined after lab review.  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 So I am putting in future order urine. Let pt know when he comes in to see Dr Marin Olp. He could come to our lab for repeat urine.  Giliana Vantil, Percell Miller, PA-C

## 2017-04-22 NOTE — Patient Instructions (Signed)
For your low vitamin d history will get vitamin d level today. With your recent reported fatigue will also get cbc, cmp, tsh, b12 and b1..  We will let you know results of labs when they come in.  Follow date to be determined after lab review.

## 2017-04-23 LAB — CBC WITH DIFFERENTIAL/PLATELET
BASOS ABS: 0.1 10*3/uL (ref 0.0–0.1)
Basophils Relative: 1.6 % (ref 0.0–3.0)
EOS ABS: 0.2 10*3/uL (ref 0.0–0.7)
Eosinophils Relative: 6.2 % — ABNORMAL HIGH (ref 0.0–5.0)
HEMATOCRIT: 39.3 % (ref 39.0–52.0)
Hemoglobin: 13.7 g/dL (ref 13.0–17.0)
LYMPHS PCT: 39.2 % (ref 12.0–46.0)
Lymphs Abs: 1.5 10*3/uL (ref 0.7–4.0)
MCHC: 34.8 g/dL (ref 30.0–36.0)
MCV: 94.6 fl (ref 78.0–100.0)
Monocytes Absolute: 0.5 10*3/uL (ref 0.1–1.0)
Monocytes Relative: 12.9 % — ABNORMAL HIGH (ref 3.0–12.0)
NEUTROS ABS: 1.6 10*3/uL (ref 1.4–7.7)
Neutrophils Relative %: 40.1 % — ABNORMAL LOW (ref 43.0–77.0)
PLATELETS: 291 10*3/uL (ref 150.0–400.0)
RBC: 4.15 Mil/uL — ABNORMAL LOW (ref 4.22–5.81)
RDW: 15.3 % (ref 11.5–15.5)
WBC: 3.9 10*3/uL — AB (ref 4.0–10.5)

## 2017-04-23 LAB — COMPREHENSIVE METABOLIC PANEL
ALT: 12 U/L (ref 0–53)
AST: 13 U/L (ref 0–37)
Albumin: 4.4 g/dL (ref 3.5–5.2)
Alkaline Phosphatase: 52 U/L (ref 39–117)
BILIRUBIN TOTAL: 0.4 mg/dL (ref 0.2–1.2)
BUN: 13 mg/dL (ref 6–23)
CALCIUM: 9.6 mg/dL (ref 8.4–10.5)
CO2: 31 meq/L (ref 19–32)
CREATININE: 1.25 mg/dL (ref 0.40–1.50)
Chloride: 102 mEq/L (ref 96–112)
GFR: 76.78 mL/min (ref >60.00)
Glucose, Bld: 86 mg/dL (ref 70–99)
Potassium: 4.2 mEq/L (ref 3.5–5.1)
Sodium: 137 mEq/L (ref 135–145)
Total Protein: 7.4 g/dL (ref 6.0–8.3)

## 2017-04-23 LAB — TSH: TSH: 1.35 u[IU]/mL (ref 0.35–4.50)

## 2017-04-23 LAB — VITAMIN B12: VITAMIN B 12: 173 pg/mL — AB (ref 211–911)

## 2017-04-23 NOTE — Telephone Encounter (Signed)
Spoke with pt. Pt states he will come in to drop urine off when he comes to see DR Ennever.Pt is on lab schedule.

## 2017-04-26 LAB — VITAMIN B1: VITAMIN B1 (THIAMINE): 10 nmol/L (ref 8–30)

## 2017-04-28 ENCOUNTER — Ambulatory Visit (INDEPENDENT_AMBULATORY_CARE_PROVIDER_SITE_OTHER): Payer: 59

## 2017-04-28 DIAGNOSIS — E538 Deficiency of other specified B group vitamins: Secondary | ICD-10-CM | POA: Diagnosis not present

## 2017-04-28 LAB — VITAMIN D 1,25 DIHYDROXY
VITAMIN D 1, 25 (OH) TOTAL: 57 pg/mL (ref 18–72)
VITAMIN D2 1, 25 (OH): 34 pg/mL
VITAMIN D3 1, 25 (OH): 23 pg/mL

## 2017-04-28 MED ORDER — CYANOCOBALAMIN 1000 MCG/ML IJ SOLN
1000.0000 ug | Freq: Once | INTRAMUSCULAR | Status: AC
  Administered 2017-04-28: 1000 ug via INTRAMUSCULAR

## 2017-04-28 NOTE — Progress Notes (Signed)
Noted  

## 2017-04-28 NOTE — Progress Notes (Signed)
Pre visit review using our clinic tool,if applicable. No additional management support is needed unless otherwise documented below in the visit note.   Patient in for B12 injection. Given 1st dose 1000 mcg IM left deltoid. No complaints voiced today.   Apointment scheduled for next dose in 1 week.  Note for work sent to patients My Chart.

## 2017-05-04 ENCOUNTER — Ambulatory Visit (INDEPENDENT_AMBULATORY_CARE_PROVIDER_SITE_OTHER): Payer: 59

## 2017-05-04 ENCOUNTER — Other Ambulatory Visit: Payer: Self-pay | Admitting: *Deleted

## 2017-05-04 DIAGNOSIS — E538 Deficiency of other specified B group vitamins: Secondary | ICD-10-CM

## 2017-05-04 DIAGNOSIS — C9000 Multiple myeloma not having achieved remission: Secondary | ICD-10-CM

## 2017-05-04 MED ORDER — CYANOCOBALAMIN 1000 MCG/ML IJ SOLN
1000.0000 ug | Freq: Once | INTRAMUSCULAR | Status: AC
  Administered 2017-05-04: 1000 ug via INTRAMUSCULAR

## 2017-05-04 MED ORDER — LENALIDOMIDE 10 MG PO CAPS: ORAL_CAPSULE | ORAL | 0 refills | Status: DC

## 2017-05-04 NOTE — Patient Instructions (Addendum)
Per Dr. Charlett Blake patient to come in weekly for 4 weeks then monthly for 2 months after reviewing labs..  Patient in today for his 2nd B12 injection. Given 1000 mcg IM Right deltoid. Patient tolerated well.  Appointment scheduled  For next injection(3 of first  4) on May 12, 2017 @ 4 pm.

## 2017-05-04 NOTE — Progress Notes (Signed)
Pre visit review using our clinic tool,if applicable. No additional management support is needed unless otherwise documented below in the visit note.   Patient in for 2nd B12 injection per order from [facility], PA-C dated 04/24/17.  Per Dr. Charlett Blake patient to come in weekly for 4 weeks then monthly for 2 months after reviewing labs..  Patient in today for his 2nd B12 injection. Given 1000 mcg IM Right deltoid. Patient tolerated well.  Appointment scheduled  For next injection(3 of first  4) on May 12, 2017 @ 4 pm.

## 2017-05-04 NOTE — Progress Notes (Signed)
Nurse note reviewed. Agree with documention and plan. 

## 2017-05-12 ENCOUNTER — Ambulatory Visit (INDEPENDENT_AMBULATORY_CARE_PROVIDER_SITE_OTHER): Payer: 59 | Admitting: Behavioral Health

## 2017-05-12 DIAGNOSIS — E538 Deficiency of other specified B group vitamins: Secondary | ICD-10-CM | POA: Diagnosis not present

## 2017-05-12 MED ORDER — CYANOCOBALAMIN 1000 MCG/ML IJ SOLN
1000.0000 ug | Freq: Once | INTRAMUSCULAR | Status: AC
  Administered 2017-05-12: 1000 ug via INTRAMUSCULAR

## 2017-05-12 NOTE — Progress Notes (Signed)
Pre visit review using our clinic review tool, if applicable. No additional management support is needed unless otherwise documented below in the visit note.  Patient came in clinic today for #3rd weekly B12 injection. IM injection was given in the left deltoid. Patient tolerated it well.

## 2017-05-13 NOTE — Progress Notes (Signed)
Noted  

## 2017-05-17 ENCOUNTER — Other Ambulatory Visit: Payer: 59

## 2017-05-17 ENCOUNTER — Ambulatory Visit: Payer: 59

## 2017-05-17 ENCOUNTER — Ambulatory Visit: Payer: 59 | Admitting: Hematology & Oncology

## 2017-05-18 ENCOUNTER — Ambulatory Visit (HOSPITAL_BASED_OUTPATIENT_CLINIC_OR_DEPARTMENT_OTHER): Payer: 59

## 2017-05-18 ENCOUNTER — Ambulatory Visit (INDEPENDENT_AMBULATORY_CARE_PROVIDER_SITE_OTHER): Payer: 59

## 2017-05-18 ENCOUNTER — Ambulatory Visit (HOSPITAL_BASED_OUTPATIENT_CLINIC_OR_DEPARTMENT_OTHER): Payer: 59 | Admitting: Hematology & Oncology

## 2017-05-18 ENCOUNTER — Other Ambulatory Visit: Payer: Self-pay | Admitting: *Deleted

## 2017-05-18 ENCOUNTER — Ambulatory Visit: Payer: 59 | Admitting: Family

## 2017-05-18 ENCOUNTER — Other Ambulatory Visit (HOSPITAL_BASED_OUTPATIENT_CLINIC_OR_DEPARTMENT_OTHER): Payer: 59

## 2017-05-18 VITALS — BP 139/83 | HR 64 | Temp 98.3°F | Resp 16 | Wt 196.0 lb

## 2017-05-18 DIAGNOSIS — C9001 Multiple myeloma in remission: Secondary | ICD-10-CM

## 2017-05-18 DIAGNOSIS — E538 Deficiency of other specified B group vitamins: Secondary | ICD-10-CM

## 2017-05-18 DIAGNOSIS — C9000 Multiple myeloma not having achieved remission: Secondary | ICD-10-CM

## 2017-05-18 LAB — CBC WITH DIFFERENTIAL (CANCER CENTER ONLY)
BASO#: 0 10*3/uL (ref 0.0–0.2)
BASO%: 1 % (ref 0.0–2.0)
EOS%: 4.2 % (ref 0.0–7.0)
Eosinophils Absolute: 0.2 10*3/uL (ref 0.0–0.5)
HEMATOCRIT: 38.4 % — AB (ref 38.7–49.9)
HGB: 14 g/dL (ref 13.0–17.1)
LYMPH#: 1.5 10*3/uL (ref 0.9–3.3)
LYMPH%: 38.6 % (ref 14.0–48.0)
MCH: 32.9 pg (ref 28.0–33.4)
MCHC: 36.5 g/dL — ABNORMAL HIGH (ref 32.0–35.9)
MCV: 90 fL (ref 82–98)
MONO#: 0.5 10*3/uL (ref 0.1–0.9)
MONO%: 14.1 % — ABNORMAL HIGH (ref 0.0–13.0)
NEUT%: 42.1 % (ref 40.0–80.0)
NEUTROS ABS: 1.6 10*3/uL (ref 1.5–6.5)
Platelets: 215 10*3/uL (ref 145–400)
RBC: 4.25 10*6/uL (ref 4.20–5.70)
RDW: 13.3 % (ref 11.1–15.7)
WBC: 3.8 10*3/uL — AB (ref 4.0–10.0)

## 2017-05-18 LAB — CMP (CANCER CENTER ONLY)
ALK PHOS: 46 U/L (ref 26–84)
ALT: 24 U/L (ref 10–47)
AST: 22 U/L (ref 11–38)
Albumin: 3.6 g/dL (ref 3.3–5.5)
BILIRUBIN TOTAL: 1.1 mg/dL (ref 0.20–1.60)
BUN: 8 mg/dL (ref 7–22)
CALCIUM: 8.9 mg/dL (ref 8.0–10.3)
CO2: 26 mEq/L (ref 18–33)
Chloride: 108 mEq/L (ref 98–108)
Creat: 1.4 mg/dl — ABNORMAL HIGH (ref 0.6–1.2)
GLUCOSE: 91 mg/dL (ref 73–118)
POTASSIUM: 3.6 meq/L (ref 3.3–4.7)
Sodium: 137 mEq/L (ref 128–145)
TOTAL PROTEIN: 6.8 g/dL (ref 6.4–8.1)

## 2017-05-18 MED ORDER — DENOSUMAB 120 MG/1.7ML ~~LOC~~ SOLN
120.0000 mg | Freq: Once | SUBCUTANEOUS | Status: AC
Start: 2017-05-18 — End: 2017-05-18
  Administered 2017-05-18: 120 mg via SUBCUTANEOUS
  Filled 2017-05-18: qty 1.7

## 2017-05-18 MED ORDER — CYCLOBENZAPRINE HCL 7.5 MG PO TABS: 7.5000 mg | ORAL_TABLET | Freq: Three times a day (TID) | ORAL | 0 refills | Status: DC | PRN

## 2017-05-18 MED ORDER — CYCLOBENZAPRINE HCL 5 MG PO TABS: 5.0000 mg | ORAL_TABLET | Freq: Three times a day (TID) | ORAL | 0 refills | Status: DC | PRN

## 2017-05-18 MED ORDER — CYANOCOBALAMIN 1000 MCG/ML IJ SOLN
1000.0000 ug | Freq: Once | INTRAMUSCULAR | Status: AC
  Administered 2017-05-18: 1000 ug via INTRAMUSCULAR

## 2017-05-18 MED FILL — CYCLOBENZAPRINE 5 MG TABLET: 5 | 10 days supply | Qty: 30 | Fill #0

## 2017-05-18 NOTE — Progress Notes (Signed)
Pre visit review using our clinic tool,if applicable. No additional management support is needed unless otherwise documented below in the visit note.   Patient in for B12 injection per order from M. Osullivan,NP due to patient having B12 deficiency.  Given 1000 mcg IM left deltoid. Patient tolerated well.    Marland Kitchen

## 2017-05-18 NOTE — Progress Notes (Signed)
Hematology and Oncology Follow Up Visit  Jimmy Day [facility] [id number] May 28, 1961 56 y.o. 05/18/2017   Principle Diagnosis:  IgG Kappa myeloma  Current Therapy:   Revlimid 10 mg daily (21/7) Xgeva 120 mg sq every 3 months  Status post autologous stem cell transplant (April 2016)    Interim History:  Jimmy Day is here today for a follow-up. He continues to do well on Revlimid and is asymptomatic at this time.   We last saw him back in March there is no monoclonal spike in his blood. His IgG level was 1360 mg/dL. His Kappa Light chain was 1.8 mg/dL.  He is still working. He drives a truck.  He had good time in [facility]. He has wife went to [facility] April. The had a really nice time. Going to see shows. His favorite show was the Jimmy Day show. He also went to [facility] to see a basketball game.   There's been no problems with fever. He's had no change in bowel or bladder habits. He's had no leg swelling. He's had no headaches.  He has complained of some pain in the right hip. I think he has had this before. We will get a x-ray for this.   Currently, his performance status is ECOG 0.   Medications:  Allergies as of 05/18/2017   No Known Allergies     Medication List       Accurate as of 05/18/17 11:57 AM. Always use your most recent med list.          acyclovir 400 MG tablet Commonly known as:  ZOVIRAX Take 1 tablet (400 mg total) by mouth 2 (two) times daily.   amLODipine 5 MG tablet Commonly known as:  NORVASC Take 1 tablet (5 mg total) by mouth daily.   azithromycin 250 MG tablet Commonly known as:  ZITHROMAX Take 2 tabs the first day and then 1 tab daily until you run out.   benzonatate 100 MG capsule Commonly known as:  TESSALON Take 1 capsule (100 mg total) by mouth 3 (three) times daily as needed.   denosumab 120 MG/1.7ML Soln injection Commonly known as:  XGEVA Inject 1.7 mLs (120 mg total) into the skin every 3 (three) months.   lenalidomide 10 MG  capsule Commonly known as:  REVLIMID Daily for 21 days then 7 days off. [id number]   sildenafil 100 MG tablet Commonly known as:  VIAGRA Take 1 tablet (100 mg total) by mouth daily as needed for erectile dysfunction.   Vitamin D (Ergocalciferol) 50000 units Caps capsule Commonly known as:  DRISDOL TAKE ONE CAPSULE BY MOUTH EVERY 7 DAYS       Allergies: No Known Allergies  Past Medical History, Surgical history, Social history, and Family History were reviewed and updated.  Review of Systems: All other 10 point review of systems is negative.   Physical Exam:  weight is 196 lb (88.9 kg). His oral temperature is 98.3 F (36.8 C). His blood pressure is 139/83 and his pulse is 64. His respiration is 16 and oxygen saturation is 100%.   Wt Readings from Last 3 Encounters:  05/18/17 196 lb (88.9 kg)  04/22/17 200 lb 6.4 oz (90.9 kg)  03/01/17 191 lb 3.2 oz (86.7 kg)    Well-developed and well-nourished African-American male. Head and neck exam shows no ocular or oral lesions. There are no palpable cervical or supraclavicular lymph nodes. Lungs are clear. Cardiac exam regular rate and rhythm with no murmurs, rubs or bruits.  Abdomen is soft. He has good bowel sounds. There is no fluid wave. There is no palpable liver or spleen tip. Back exam shows no tenderness over the spine, ribs or hips. Extremities shows no clubbing, cyanosis or edema. Skin exam no rashes.ed  Lab Results  Component Value Date   WBC 3.8 (L) 05/18/2017   HGB 14.0 05/18/2017   HCT 38.4 (L) 05/18/2017   MCV 90 05/18/2017   PLT 215 05/18/2017   Lab Results  Component Value Date   FERRITIN 152 08/17/2014   IRON 106 08/17/2014   TIBC 253 08/17/2014   UIBC 146 08/17/2014   IRONPCTSAT 42 08/17/2014   Lab Results  Component Value Date   RBC 4.25 05/18/2017   Lab Results  Component Value Date   KPAFRELGTCHN 2.34 (H) 10/04/2015   LAMBDASER 1.29 10/04/2015   KAPLAMBRATIO 1.51 09/25/2016   Lab Results    Component Value Date   IGGSERUM 1,360 09/25/2016   IGA 23 (L) 10/04/2015   IGMSERUM 22 09/25/2016   Lab Results  Component Value Date   TOTALPROTELP 6.8 10/04/2015   ALBUMINELP 4.3 10/04/2015   A1GS 0.4 (H) 10/04/2015   A2GS 0.5 10/04/2015   BETS 0.4 10/04/2015   BETA2SER 0.2 10/04/2015   GAMS 1.0 10/04/2015   MSPIKE Not Observed 06/26/2016   SPEI * 10/04/2015     Chemistry      Component Value Date/Time   NA 137 05/18/2017 1042   NA 142 04/01/2015 0834   K 3.6 05/18/2017 1042   K 3.4 (L) 04/01/2015 0834   CL 108 05/18/2017 1042   CO2 26 05/18/2017 1042   CO2 20 (L) 04/01/2015 0834   BUN 8 05/18/2017 1042   BUN 10.4 04/01/2015 0834   CREATININE 1.4 (H) 05/18/2017 1042   CREATININE 1.2 04/01/2015 0834      Component Value Date/Time   CALCIUM 8.9 05/18/2017 1042   CALCIUM 8.6 04/01/2015 0834   ALKPHOS 46 05/18/2017 1042   ALKPHOS 39 (L) 04/01/2015 0834   AST 22 05/18/2017 1042   AST 17 04/01/2015 0834   ALT 24 05/18/2017 1042   ALT 19 04/01/2015 0834   BILITOT 1.10 05/18/2017 1042   BILITOT 0.56 04/01/2015 0834     Impression and Plan: Jimmy Day is 56 year old gentleman with IgG kappa myeloma s/p autologous stem cell transplant in April 2016. So far, there has been no evidence of recurrence. He is asymptomatic at this time and has no complaints. No M-spike detected in August. Myeloma studies today are pending.   We will do Xgeva today.  He will continue on the Revlimid.  We will see what his x-ray shows.  I will see him back in 3 months.   [name], MD 6/26/201811:57 AM

## 2017-05-18 NOTE — Progress Notes (Signed)
Noted  

## 2017-05-18 NOTE — Addendum Note (Signed)
Addended by: Burney Gauze R on: 05/18/2017 12:19 PM   Modules accepted: Orders

## 2017-05-18 NOTE — Patient Instructions (Signed)

## 2017-05-19 LAB — PROTEIN ELECTROPHORESIS, SERUM, WITH REFLEX
A/G RATIO SPE: 1.3 (ref 0.7–1.7)
ALPHA 2: 0.4 g/dL (ref 0.4–1.0)
Albumin: 3.9 g/dL (ref 2.9–4.4)
Alpha 1: 0.2 g/dL (ref 0.0–0.4)
BETA: 0.8 g/dL (ref 0.7–1.3)
GLOBULIN, TOTAL: 2.9 g/dL (ref 2.2–3.9)
Gamma Globulin: 1.5 g/dL (ref 0.4–1.8)
Total Protein: 6.8 g/dL (ref 6.0–8.5)

## 2017-05-19 LAB — KAPPA/LAMBDA LIGHT CHAINS
IG KAPPA FREE LIGHT CHAIN: 19.5 mg/L — AB (ref 3.3–19.4)
IG LAMBDA FREE LIGHT CHAIN: 13.5 mg/L (ref 5.7–26.3)
KAPPA/LAMBDA FLC RATIO: 1.44 (ref 0.26–1.65)

## 2017-05-19 LAB — IGG, IGA, IGM
IgA, Qn, Serum: 17 mg/dL — ABNORMAL LOW (ref 90–386)
IgG, Qn, Serum: 1511 mg/dL (ref 700–1600)
IgM, Qn, Serum: 34 mg/dL (ref 20–172)

## 2017-05-25 ENCOUNTER — Other Ambulatory Visit: Payer: Self-pay | Admitting: Family

## 2017-05-25 DIAGNOSIS — N521 Erectile dysfunction due to diseases classified elsewhere: Secondary | ICD-10-CM

## 2017-05-25 DIAGNOSIS — C9000 Multiple myeloma not having achieved remission: Secondary | ICD-10-CM

## 2017-05-25 MED FILL — AMLODIPINE BESYLATE 5 MG TA: 5 | 30 days supply | Qty: 30 | Fill #0

## 2017-05-25 MED FILL — SILDENAFIL 100 MG TABLET: 100 | 30 days supply | Qty: 4 | Fill #0

## 2017-05-31 ENCOUNTER — Other Ambulatory Visit: Payer: Self-pay | Admitting: *Deleted

## 2017-05-31 DIAGNOSIS — C9000 Multiple myeloma not having achieved remission: Secondary | ICD-10-CM

## 2017-05-31 MED ORDER — LENALIDOMIDE 10 MG PO CAPS: ORAL_CAPSULE | ORAL | 0 refills | Status: DC

## 2017-06-16 ENCOUNTER — Ambulatory Visit (INDEPENDENT_AMBULATORY_CARE_PROVIDER_SITE_OTHER): Payer: 59

## 2017-06-16 ENCOUNTER — Ambulatory Visit (HOSPITAL_BASED_OUTPATIENT_CLINIC_OR_DEPARTMENT_OTHER)
Admission: RE | Admit: 2017-06-16 | Discharge: 2017-06-16 | Disposition: A | Discharge status: Home / Self Care | Payer: 59 | Source: Ambulatory Visit | Attending: Hematology & Oncology | Admitting: Hematology & Oncology

## 2017-06-16 DIAGNOSIS — C9000 Multiple myeloma not having achieved remission: Secondary | ICD-10-CM

## 2017-06-16 DIAGNOSIS — E538 Deficiency of other specified B group vitamins: Secondary | ICD-10-CM | POA: Diagnosis not present

## 2017-06-16 DIAGNOSIS — M25551 Pain in right hip: Secondary | ICD-10-CM | POA: Diagnosis present

## 2017-06-16 MED ORDER — CYANOCOBALAMIN 1000 MCG/ML IJ SOLN
1000.0000 ug | Freq: Once | INTRAMUSCULAR | Status: AC
  Administered 2017-06-16: 1000 ug via INTRAMUSCULAR

## 2017-06-16 MED FILL — AMLODIPINE BESYLATE 5 MG TA: 5 | 30 days supply | Qty: 30 | Fill #1

## 2017-06-16 MED FILL — SILDENAFIL CITRATE 100 MG T: 100 | 29 days supply | Qty: 4 | Fill #1

## 2017-06-16 NOTE — Progress Notes (Signed)
Pre visit review using our clinic tool,if applicable. No additional management support is needed unless otherwise documented below in the visit note.   Patient in for B12 injection per order from M.O'sulivan, NP due to B12 deficiency.   Given 1000 mcg IM left deltoid. Patient tolerated well. Patient to make return appointment for 1 month.

## 2017-06-17 ENCOUNTER — Telehealth: Payer: Self-pay | Admitting: *Deleted

## 2017-06-17 NOTE — Telephone Encounter (Addendum)
Patient is aware of results  ----- Message from Eliezer Bottom, NP sent at 06/17/2017  9:43 AM EDT ----- Regarding: Xray Degenerative changes to the right hip only. No evidence of malignancy. Thank you!  Sarah  ----- Message ----- From: [name], MD Sent: 06/17/2017   7:04 AM To: Eliezer Bottom, NP    ----- Message ----- From: Buel Ream, Rad Results In Sent: 06/16/2017   7:20 PM To: [name], MD

## 2017-06-28 ENCOUNTER — Other Ambulatory Visit: Payer: Self-pay | Admitting: *Deleted

## 2017-06-28 DIAGNOSIS — C9000 Multiple myeloma not having achieved remission: Secondary | ICD-10-CM

## 2017-06-28 MED ORDER — LENALIDOMIDE 10 MG PO CAPS: ORAL_CAPSULE | ORAL | 0 refills | Status: DC

## 2017-07-16 ENCOUNTER — Other Ambulatory Visit: Payer: Self-pay | Admitting: *Deleted

## 2017-07-16 ENCOUNTER — Ambulatory Visit: Payer: 59

## 2017-07-16 DIAGNOSIS — C9000 Multiple myeloma not having achieved remission: Secondary | ICD-10-CM

## 2017-07-16 MED ORDER — LENALIDOMIDE 10 MG PO CAPS: ORAL_CAPSULE | ORAL | 0 refills | Status: DC

## 2017-07-19 MED FILL — AMLODIPINE BESYLATE 5 MG TA: 5 | 30 days supply | Qty: 30 | Fill #2

## 2017-08-04 ENCOUNTER — Other Ambulatory Visit: Payer: Self-pay | Admitting: *Deleted

## 2017-08-04 DIAGNOSIS — C9000 Multiple myeloma not having achieved remission: Secondary | ICD-10-CM

## 2017-08-04 MED ORDER — LENALIDOMIDE 10 MG PO CAPS: ORAL_CAPSULE | ORAL | 0 refills | Status: DC

## 2017-08-13 ENCOUNTER — Ambulatory Visit (INDEPENDENT_AMBULATORY_CARE_PROVIDER_SITE_OTHER): Payer: 59 | Admitting: Family

## 2017-08-13 ENCOUNTER — Other Ambulatory Visit (HOSPITAL_BASED_OUTPATIENT_CLINIC_OR_DEPARTMENT_OTHER): Payer: 59

## 2017-08-13 ENCOUNTER — Encounter: Payer: Self-pay | Admitting: Family

## 2017-08-13 ENCOUNTER — Ambulatory Visit (HOSPITAL_BASED_OUTPATIENT_CLINIC_OR_DEPARTMENT_OTHER): Payer: 59 | Admitting: Hematology & Oncology

## 2017-08-13 ENCOUNTER — Ambulatory Visit (HOSPITAL_BASED_OUTPATIENT_CLINIC_OR_DEPARTMENT_OTHER): Payer: 59

## 2017-08-13 VITALS — BP 139/87 | HR 62 | Temp 98.0°F | Resp 16 | Wt 194.0 lb

## 2017-08-13 VITALS — BP 138/85 | HR 57 | Temp 98.1°F | Resp 18 | Ht 71.0 in | Wt 194.6 lb

## 2017-08-13 DIAGNOSIS — E559 Vitamin D deficiency, unspecified: Secondary | ICD-10-CM

## 2017-08-13 DIAGNOSIS — Z9484 Stem cells transplant status: Secondary | ICD-10-CM | POA: Diagnosis not present

## 2017-08-13 DIAGNOSIS — E291 Testicular hypofunction: Secondary | ICD-10-CM | POA: Diagnosis not present

## 2017-08-13 DIAGNOSIS — Z23 Encounter for immunization: Secondary | ICD-10-CM

## 2017-08-13 DIAGNOSIS — C9000 Multiple myeloma not having achieved remission: Secondary | ICD-10-CM

## 2017-08-13 DIAGNOSIS — N529 Male erectile dysfunction, unspecified: Secondary | ICD-10-CM

## 2017-08-13 DIAGNOSIS — C9001 Multiple myeloma in remission: Secondary | ICD-10-CM | POA: Diagnosis not present

## 2017-08-13 DIAGNOSIS — E785 Hyperlipidemia, unspecified: Secondary | ICD-10-CM | POA: Diagnosis not present

## 2017-08-13 DIAGNOSIS — E538 Deficiency of other specified B group vitamins: Secondary | ICD-10-CM | POA: Diagnosis not present

## 2017-08-13 DIAGNOSIS — I10 Essential (primary) hypertension: Secondary | ICD-10-CM | POA: Diagnosis not present

## 2017-08-13 LAB — CBC WITH DIFFERENTIAL (CANCER CENTER ONLY)
BASO#: 0 10*3/uL (ref 0.0–0.2)
BASO%: 0.3 % (ref 0.0–2.0)
EOS ABS: 0.1 10*3/uL (ref 0.0–0.5)
EOS%: 3.4 % (ref 0.0–7.0)
HCT: 39.4 % (ref 38.7–49.9)
HGB: 14.1 g/dL (ref 13.0–17.1)
LYMPH#: 1.2 10*3/uL (ref 0.9–3.3)
LYMPH%: 34.3 % (ref 14.0–48.0)
MCH: 32.6 pg (ref 28.0–33.4)
MCHC: 35.8 g/dL (ref 32.0–35.9)
MCV: 91 fL (ref 82–98)
MONO#: 0.3 10*3/uL (ref 0.1–0.9)
MONO%: 8.5 % (ref 0.0–13.0)
NEUT#: 1.9 10*3/uL (ref 1.5–6.5)
NEUT%: 53.5 % (ref 40.0–80.0)
PLATELETS: 184 10*3/uL (ref 145–400)
RBC: 4.33 10*6/uL (ref 4.20–5.70)
RDW: 13.6 % (ref 11.1–15.7)
WBC: 3.5 10*3/uL — ABNORMAL LOW (ref 4.0–10.0)

## 2017-08-13 LAB — CMP (CANCER CENTER ONLY)
ALBUMIN: 3.8 g/dL (ref 3.3–5.5)
ALT(SGPT): 21 U/L (ref 10–47)
AST: 22 U/L (ref 11–38)
Alkaline Phosphatase: 42 U/L (ref 26–84)
BUN: 8 mg/dL (ref 7–22)
CHLORIDE: 105 meq/L (ref 98–108)
CO2: 28 meq/L (ref 18–33)
CREATININE: 1.4 mg/dL — AB (ref 0.6–1.2)
Calcium: 9.1 mg/dL (ref 8.0–10.3)
GLUCOSE: 98 mg/dL (ref 73–118)
Potassium: 3.6 mEq/L (ref 3.3–4.7)
SODIUM: 139 meq/L (ref 128–145)
Total Bilirubin: 1 mg/dl (ref 0.20–1.60)
Total Protein: 7.1 g/dL (ref 6.4–8.1)

## 2017-08-13 LAB — LIPID PANEL
CHOL/HDL RATIO: 4
CHOLESTEROL: 139 mg/dL (ref 0–200)
HDL: 34 mg/dL — ABNORMAL LOW (ref >39.00)
LDL CALC: 83 mg/dL (ref 0–99)
NonHDL: 105.42
TRIGLYCERIDES: 110 mg/dL (ref 0.0–149.0)
VLDL: 22 mg/dL (ref 0.0–40.0)

## 2017-08-13 LAB — BASIC METABOLIC PANEL
BUN: 9 mg/dL (ref 6–23)
CHLORIDE: 103 meq/L (ref 96–112)
CO2: 30 meq/L (ref 19–32)
Calcium: 9.4 mg/dL (ref 8.4–10.5)
Creatinine, Ser: 1.34 mg/dL (ref 0.40–1.50)
GFR: 70.79 mL/min (ref >60.00)
Glucose, Bld: 93 mg/dL (ref 70–99)
Potassium: 3.8 mEq/L (ref 3.5–5.1)
Sodium: 138 mEq/L (ref 135–145)

## 2017-08-13 MED ORDER — DENOSUMAB 120 MG/1.7ML ~~LOC~~ SOLN
120.0000 mg | Freq: Once | SUBCUTANEOUS | Status: AC
  Administered 2017-08-13: 120 mg via SUBCUTANEOUS

## 2017-08-13 MED ORDER — CYANOCOBALAMIN 1000 MCG/ML IJ SOLN
1000.0000 ug | Freq: Once | INTRAMUSCULAR | Status: AC
  Administered 2017-08-13: 1000 ug via INTRAMUSCULAR

## 2017-08-13 MED ORDER — CYANOCOBALAMIN 1000 MCG/ML IJ SOLN: 1000.0000 ug | Freq: Once | INTRAMUSCULAR | 0 refills | Status: AC

## 2017-08-13 MED ORDER — AMLODIPINE BESYLATE 5 MG PO TABS: 5.0000 mg | ORAL_TABLET | Freq: Every day | ORAL | 5 refills | Status: DC

## 2017-08-13 MED ORDER — VITAMIN D3 75 MCG (3000 UT) PO TABS
1.0000 | ORAL_TABLET | Freq: Every day | ORAL | Status: DC | PRN
Start: 2017-08-13 — End: 2018-11-28

## 2017-08-13 MED ORDER — DENOSUMAB 120 MG/1.7ML ~~LOC~~ SOLN
SUBCUTANEOUS | Status: AC
  Filled 2017-08-13: qty 1.7

## 2017-08-13 MED FILL — AMLODIPINE BESYLATE 5 MG TA: 5 | 30 days supply | Qty: 30 | Fill #0

## 2017-08-13 MED FILL — CYANOCOBALAMIN 1,000 MCG/ML: 1000 | 1 days supply | Qty: 1 | Fill #0

## 2017-08-13 NOTE — Assessment & Plan Note (Signed)
Encouraged pt to keep apt with urology later today.

## 2017-08-13 NOTE — Assessment & Plan Note (Signed)
Check vit D level. 

## 2017-08-13 NOTE — Addendum Note (Signed)
Addended by: Kelle Darting A on: 08/13/2017 10:33 AM   Modules accepted: Orders

## 2017-08-13 NOTE — Progress Notes (Signed)
Hematology and Oncology Follow Up Visit  Jimmy Day [facility] [id number] 09/26/1961 56 y.o. 08/13/2017   Principle Diagnosis:  IgG Kappa myeloma  Current Therapy:   Revlimid 10 mg daily (21/7) Xgeva 120 mg sq every 3 months  Status post autologous stem cell transplant (April 2016)    Interim History:  Jimmy Day is here today for a follow-up. He is doing quite well. He is still working without any difficulties. He is having some pain in the right hip. He has some degenerative changes in this hip. I told him that if this becomes more of a problem, then he can see orthopedic surgery.  His myeloma has not been active. His last monoclonal studies done back in June showed no monoclonal spike in his blood. His IgG level was 1511 mg/dL. His kappa light chain was 1.9 mg/dL.   His appetite is good. He's had no cough or shortness of breath. He's had no rashes. There's been no change in bowel or bladder habits.   Overall, his performance status is ECOG 0.   Medications:  Allergies as of 08/13/2017   No Known Allergies     Medication List       Accurate as of 08/13/17 10:35 AM. Always use your most recent med list.          amLODipine 5 MG tablet Commonly known as:  NORVASC Take 1 tablet (5 mg total) by mouth daily.   cyanocobalamin 1000 MCG/ML injection Commonly known as:  (VITAMIN B-12) Inject 1 mL (1,000 mcg total) into the muscle once.   denosumab 120 MG/1.7ML Soln injection Commonly known as:  XGEVA Inject 1.7 mLs (120 mg total) into the skin every 3 (three) months.   lenalidomide 10 MG capsule Commonly known as:  REVLIMID Daily for 21 days then 7 days off. [id number]   VIAGRA 100 MG tablet Generic drug:  sildenafil TAKE 1 TABLET BY MOUTH DAILY AS NEEDED FOR ERECTILE DYSFUNCTION   Vitamin D3 3000 units Tabs Take 1 tablet by mouth daily as needed.       Allergies: No Known Allergies  Past Medical History, Surgical history, Social history, and Family History were reviewed  and updated.  Review of Systems: As stated in the interim history   Physical Exam:  weight is 194 lb (88 kg). His oral temperature is 98 F (36.7 C). His blood pressure is 139/87 and his pulse is 62. His respiration is 16 and oxygen saturation is 100%.   Wt Readings from Last 3 Encounters:  08/13/17 194 lb (88 kg)  08/13/17 194 lb 9.6 oz (88.3 kg)  05/18/17 196 lb (88.9 kg)    Physical Exam  Constitutional: He is oriented to person, place, and time.  HENT:  Head: Normocephalic and atraumatic.  Mouth/Throat: Oropharynx is clear and moist.  Eyes: Pupils are equal, round, and reactive to light. EOM are normal.  Neck: Normal range of motion.  Cardiovascular: Normal rate, regular rhythm and normal heart sounds.   Pulmonary/Chest: Effort normal and breath sounds normal.  Abdominal: Soft. Bowel sounds are normal.  Musculoskeletal: Normal range of motion. He exhibits no edema, tenderness or deformity.  Lymphadenopathy:    He has no cervical adenopathy.  Neurological: He is alert and oriented to person, place, and time.  Skin: Skin is warm and dry. No rash noted. No erythema.  Psychiatric: He has a normal mood and affect. His behavior is normal. Judgment and thought content normal.  Vitals reviewed.    Lab Results  Component Value  Date   WBC 3.5 (L) 08/13/2017   HGB 14.1 08/13/2017   HCT 39.4 08/13/2017   MCV 91 08/13/2017   PLT 184 08/13/2017   Lab Results  Component Value Date   FERRITIN 152 08/17/2014   IRON 106 08/17/2014   TIBC 253 08/17/2014   UIBC 146 08/17/2014   IRONPCTSAT 42 08/17/2014   Lab Results  Component Value Date   RBC 4.33 08/13/2017   Lab Results  Component Value Date   KPAFRELGTCHN 2.34 (H) 10/04/2015   LAMBDASER 1.29 10/04/2015   KAPLAMBRATIO 1.44 05/18/2017   Lab Results  Component Value Date   IGGSERUM 1,511 05/18/2017   IGA 23 (L) 10/04/2015   IGMSERUM 34 05/18/2017   Lab Results  Component Value Date   TOTALPROTELP 6.8 10/04/2015     ALBUMINELP 4.3 10/04/2015   A1GS 0.4 (H) 10/04/2015   A2GS 0.5 10/04/2015   BETS 0.4 10/04/2015   BETA2SER 0.2 10/04/2015   GAMS 1.0 10/04/2015   MSPIKE Not Observed 05/18/2017   SPEI * 10/04/2015     Chemistry      Component Value Date/Time   NA 139 08/13/2017 0918   NA 142 04/01/2015 0834   K 3.6 08/13/2017 0918   K 3.4 (L) 04/01/2015 0834   CL 105 08/13/2017 0918   CO2 28 08/13/2017 0918   CO2 20 (L) 04/01/2015 0834   BUN 8 08/13/2017 0918   BUN 10.4 04/01/2015 0834   CREATININE 1.4 (H) 08/13/2017 0918   CREATININE 1.2 04/01/2015 0834      Component Value Date/Time   CALCIUM 9.1 08/13/2017 0918   CALCIUM 8.6 04/01/2015 0834   ALKPHOS 42 08/13/2017 0918   ALKPHOS 39 (L) 04/01/2015 0834   AST 22 08/13/2017 0918   AST 17 04/01/2015 0834   ALT 21 08/13/2017 0918   ALT 19 04/01/2015 0834   BILITOT 1.00 08/13/2017 0918   BILITOT 0.56 04/01/2015 0834     Impression and Plan: Jimmy Day is 56 year old gentleman with IgG kappa myeloma s/p autologous stem cell transplant in April 2016.   We will go ahead and give him his Delton See today.  We will continue him on the Revlimid. I think he sees his transplant doctors next month.  The degenerative changes in his right hip are about the same. I told him that he can always see an orthopedist if this becomes more of a problem.  We will plan to see him back in 3 more months.  [name], MD 9/21/201810:35 AM

## 2017-08-13 NOTE — Assessment & Plan Note (Signed)
Stable on prn viagra, continue same.

## 2017-08-13 NOTE — Assessment & Plan Note (Signed)
Not on statin, obtain follow up lipid panel

## 2017-08-13 NOTE — Assessment & Plan Note (Signed)
b12 injection today.  

## 2017-08-13 NOTE — Assessment & Plan Note (Signed)
BP is acceptable. Continue amlodipine.

## 2017-08-13 NOTE — Patient Instructions (Signed)
Please complete lab work prior to leaving.   

## 2017-08-13 NOTE — Progress Notes (Signed)
Subjective:    Patient ID: Jimmy Day, male    DOB: Jul 13, 1961, 56 y.o.   MRN: [id number]  HPI   [name] is a 56 yr old male who presents today for follow up.  1) HTN- Pt is maintained on amlodipine 23m.   BP Readings from Last 3 Encounters:  08/13/17 138/85  05/18/17 139/83  04/22/17 (!) 144/87   2) Hyperlipidemia- He is not maintained on statin- diet only.  Lab Results  Component Value Date   CHOL 90 06/03/2015   HDL 34 (L) 06/03/2015   LDLCALC 39 06/03/2015   LDLDIRECT 88.5 08/07/2013   TRIG 84 06/03/2015   CHOLHDL 2.6 06/03/2015   3) Hypogonadism- last visit we discussed follow up with urology. Will see urology today.    4) Multiple Myeloma- follows with Dr. EMarin Olp He is maintained on Revlimid.   5) ED- maintained on prn Viagra. Reports good response.    6) Vit D deficiency- maintained.   7) B12 deficiency- due for injection today   Review of Systems    see HPI  Past Medical History:  Diagnosis Date  . Fasting hyperglycemia    NORMAL A1c  . Hyperlipidemia   . Hypertension   . Kappa light chain myeloma ([place] 02/02/2014  . Testosterone deficiency    Dr THartley Barefoot    Social History   Social History  . Marital status: Married    Spouse name: N/A  . Number of children: 3  . Years of education: N/A   Occupational History  . Truck Driver [employer]  Social History Main Topics  . Smoking status: Never Smoker  . Smokeless tobacco: Never Used     Comment: never used tobacco  . Alcohol use 0.0 oz/week     Comment:  rarely  . Drug use: No  . Sexual activity: Yes    Birth control/ protection: Condom   Other Topics Concern  . Not on file   Social History Narrative   [place]  [place]aughter at [place]   Married   Truck driver   Enjoys exercise, basketball, watch sports, work around [employer]in [employer], applications      Past Surgical History:  Procedure Laterality Date  . CERVICAL PLATE  INSERTION     POST COMPRESSION DISC INJURY  . LIPOMAS REMOVAL     BENIGN FROM CHEST AND BACK    Family History  Problem Relation Age of Onset  . Breast cancer Mother   . Heart attack Father 475 . Kidney disease Brother        RENAL FAILURE  . Hypertension Brother   . Heart attack Maternal Aunt 65       CABG  . Stroke Maternal Uncle        CVA  . Diabetes Neg Hx   . Colon cancer Neg Hx   . Esophageal cancer Neg Hx   . Rectal cancer Neg Hx   . Stomach cancer Neg Hx     No Known Allergies  Current Outpatient Prescriptions on File Prior to Visit  Medication Sig Dispense Refill  . amLODipine (NORVASC) 5 MG tablet TAKE 1 TABLET (5 MG TOTAL) BY MOUTH DAILY. 30 tablet 5  . denosumab (XGEVA) 120 MG/1.7ML SOLN injection Inject 1.7 mLs (120 mg total) into the skin every 3 (three) months. 1.7 mL 4  . lenalidomide (REVLIMID) 10 MG capsule Daily for 21 days then 7  days off. [id number] 21 capsule 0  . VIAGRA 100 MG tablet TAKE 1 TABLET BY MOUTH DAILY AS NEEDED FOR ERECTILE DYSFUNCTION 4 tablet 5   No current facility-administered medications on file prior to visit.     BP 138/85   Pulse (!) 57   Temp 98.1 F (36.7 C) (Oral)   Resp 18   Ht _0  (1.803 m)   Wt 194 lb 9.6 oz (88.3 kg)   SpO2 100%   BMI 27.14 kg/m    Objective:   Physical Exam  Constitutional: He is oriented to person, place, and time. He appears well-developed and well-nourished. No distress.  HENT:  Head: Normocephalic and atraumatic.  Cardiovascular: Normal rate and regular rhythm.   No murmur heard. Pulmonary/Chest: Effort normal and breath sounds normal. No respiratory distress. He has no wheezes. He has no rales.  Musculoskeletal: He exhibits no edema.  Neurological: He is alert and oriented to person, place, and time.  Skin: Skin is warm and dry.  Psychiatric: He has a normal mood and affect. His behavior is normal. Thought content normal.          Assessment & Plan:

## 2017-08-13 NOTE — Assessment & Plan Note (Signed)
Stable, continue follow up with oncology.

## 2017-08-13 NOTE — Patient Instructions (Signed)

## 2017-08-14 LAB — IGG, IGA, IGM
IgA, Qn, Serum: 30 mg/dL — ABNORMAL LOW (ref 90–386)
IgG, Qn, Serum: 1740 mg/dL — ABNORMAL HIGH (ref 700–1600)
IgM, Qn, Serum: 27 mg/dL (ref 20–172)

## 2017-08-16 LAB — KAPPA/LAMBDA LIGHT CHAINS
Ig Kappa Free Light Chain: 21.5 mg/L — ABNORMAL HIGH (ref 3.3–19.4)
Ig Lambda Free Light Chain: 14.4 mg/L (ref 5.7–26.3)
KAPPA/LAMBDA FLC RATIO: 1.49 (ref 0.26–1.65)

## 2017-08-17 LAB — VITAMIN D 1,25 DIHYDROXY
Vitamin D 1, 25 (OH)2 Total: 44 pg/mL (ref 18–72)
Vitamin D3 1, 25 (OH)2: 44 pg/mL

## 2017-08-17 LAB — PROTEIN ELECTROPHORESIS, SERUM, WITH REFLEX
A/G RATIO SPE: 1.2 (ref 0.7–1.7)
ALBUMIN: 3.8 g/dL (ref 2.9–4.4)
ALPHA 2: 0.5 g/dL (ref 0.4–1.0)
Alpha 1: 0.2 g/dL (ref 0.0–0.4)
BETA: 0.9 g/dL (ref 0.7–1.3)
Gamma Globulin: 1.7 g/dL (ref 0.4–1.8)
Globulin, Total: 3.3 g/dL (ref 2.2–3.9)
Total Protein: 7.1 g/dL (ref 6.0–8.5)

## 2017-09-16 ENCOUNTER — Other Ambulatory Visit: Payer: Self-pay | Admitting: *Deleted

## 2017-09-16 DIAGNOSIS — C9000 Multiple myeloma not having achieved remission: Secondary | ICD-10-CM

## 2017-09-16 MED ORDER — LENALIDOMIDE 10 MG PO CAPS: ORAL_CAPSULE | ORAL | 0 refills | Status: DC

## 2017-09-20 ENCOUNTER — Other Ambulatory Visit: Payer: Self-pay | Admitting: *Deleted

## 2017-09-20 DIAGNOSIS — C9000 Multiple myeloma not having achieved remission: Secondary | ICD-10-CM

## 2017-09-20 MED ORDER — LENALIDOMIDE 10 MG PO CAPS: ORAL_CAPSULE | ORAL | 0 refills | Status: DC

## 2017-09-24 MED FILL — AMLODIPINE BESYLATE 5 MG TA: 5 | 30 days supply | Qty: 30 | Fill #1

## 2017-10-11 ENCOUNTER — Other Ambulatory Visit: Payer: Self-pay

## 2017-10-11 DIAGNOSIS — C9000 Multiple myeloma not having achieved remission: Secondary | ICD-10-CM

## 2017-10-11 MED ORDER — LENALIDOMIDE 10 MG PO CAPS: ORAL_CAPSULE | ORAL | 0 refills | Status: DC

## 2017-10-21 MED FILL — AMLODIPINE BESYLATE 5 MG TA: 5 | 30 days supply | Qty: 30 | Fill #2

## 2017-11-03 ENCOUNTER — Other Ambulatory Visit: Payer: Self-pay | Admitting: *Deleted

## 2017-11-03 DIAGNOSIS — C9000 Multiple myeloma not having achieved remission: Secondary | ICD-10-CM

## 2017-11-03 MED ORDER — LENALIDOMIDE 10 MG PO CAPS: ORAL_CAPSULE | ORAL | 0 refills | Status: DC

## 2017-11-12 ENCOUNTER — Other Ambulatory Visit: Payer: 59

## 2017-11-12 ENCOUNTER — Ambulatory Visit: Payer: 59

## 2017-11-12 ENCOUNTER — Ambulatory Visit: Payer: 59 | Admitting: Hematology & Oncology

## 2017-11-19 MED FILL — AMLODIPINE BESYLATE 5 MG TA: 5 | 30 days supply | Qty: 30 | Fill #3

## 2017-11-24 ENCOUNTER — Other Ambulatory Visit: Payer: Self-pay | Admitting: *Deleted

## 2017-11-24 DIAGNOSIS — C9000 Multiple myeloma not having achieved remission: Secondary | ICD-10-CM

## 2017-11-24 MED ORDER — LENALIDOMIDE 10 MG PO CAPS: ORAL_CAPSULE | ORAL | 0 refills | Status: DC

## 2017-11-26 ENCOUNTER — Ambulatory Visit (INDEPENDENT_AMBULATORY_CARE_PROVIDER_SITE_OTHER): Payer: 59 | Admitting: Family

## 2017-11-26 ENCOUNTER — Encounter: Payer: Self-pay | Admitting: Family

## 2017-11-26 VITALS — BP 133/86 | HR 65 | Temp 98.3°F | Resp 16 | Ht 71.0 in | Wt 204.0 lb

## 2017-11-26 DIAGNOSIS — Z125 Encounter for screening for malignant neoplasm of prostate: Secondary | ICD-10-CM | POA: Diagnosis not present

## 2017-11-26 DIAGNOSIS — Z0000 Encounter for general adult medical examination without abnormal findings: Secondary | ICD-10-CM

## 2017-11-26 LAB — URINALYSIS, ROUTINE W REFLEX MICROSCOPIC
Bilirubin Urine: NEGATIVE
Hgb urine dipstick: NEGATIVE
Ketones, ur: NEGATIVE
Leukocytes, UA: NEGATIVE
Nitrite: NEGATIVE
PH: 7 (ref 5.0–8.0)
RBC / HPF: NONE SEEN (ref 0–?)
Total Protein, Urine: NEGATIVE
Urine Glucose: NEGATIVE
Urobilinogen, UA: 0.2 (ref 0.0–1.0)

## 2017-11-26 LAB — TSH: TSH: 1.19 u[IU]/mL (ref 0.35–4.50)

## 2017-11-26 LAB — BASIC METABOLIC PANEL
BUN: 10 mg/dL (ref 6–23)
CALCIUM: 9.2 mg/dL (ref 8.4–10.5)
CO2: 31 meq/L (ref 19–32)
CREATININE: 1.2 mg/dL (ref 0.40–1.50)
Chloride: 105 mEq/L (ref 96–112)
GFR: 80.32 mL/min (ref >60.00)
Glucose, Bld: 78 mg/dL (ref 70–99)
Potassium: 4 mEq/L (ref 3.5–5.1)
Sodium: 140 mEq/L (ref 135–145)

## 2017-11-26 LAB — CBC WITH DIFFERENTIAL/PLATELET
BASOS ABS: 0 10*3/uL (ref 0.0–0.1)
Basophils Relative: 1.3 % (ref 0.0–3.0)
EOS ABS: 0.1 10*3/uL (ref 0.0–0.7)
Eosinophils Relative: 3.7 % (ref 0.0–5.0)
HEMATOCRIT: 41.7 % (ref 39.0–52.0)
Hemoglobin: 14.4 g/dL (ref 13.0–17.0)
LYMPHS PCT: 36.5 % (ref 12.0–46.0)
Lymphs Abs: 1.4 10*3/uL (ref 0.7–4.0)
MCHC: 34.5 g/dL (ref 30.0–36.0)
MCV: 96.6 fl (ref 78.0–100.0)
Monocytes Absolute: 0.4 10*3/uL (ref 0.1–1.0)
Monocytes Relative: 10.6 % (ref 3.0–12.0)
Neutro Abs: 1.8 10*3/uL (ref 1.4–7.7)
Neutrophils Relative %: 47.9 % (ref 43.0–77.0)
PLATELETS: 237 10*3/uL (ref 150.0–400.0)
RBC: 4.31 Mil/uL (ref 4.22–5.81)
RDW: 14.3 % (ref 11.5–15.5)
WBC: 3.8 10*3/uL — AB (ref 4.0–10.5)

## 2017-11-26 LAB — HEPATIC FUNCTION PANEL
ALK PHOS: 37 U/L — AB (ref 39–117)
ALT: 17 U/L (ref 0–53)
AST: 15 U/L (ref 0–37)
Albumin: 4.2 g/dL (ref 3.5–5.2)
BILIRUBIN DIRECT: 0.1 mg/dL (ref 0.0–0.3)
TOTAL PROTEIN: 6.9 g/dL (ref 6.0–8.3)
Total Bilirubin: 0.7 mg/dL (ref 0.2–1.2)

## 2017-11-26 LAB — LIPID PANEL
CHOL/HDL RATIO: 5
Cholesterol: 159 mg/dL (ref 0–200)
HDL: 32.5 mg/dL — AB (ref >39.00)
NONHDL: 126.05
Triglycerides: 207 mg/dL — ABNORMAL HIGH (ref 0.0–149.0)
VLDL: 41.4 mg/dL — AB (ref 0.0–40.0)

## 2017-11-26 LAB — LDL CHOLESTEROL, DIRECT: Direct LDL: 93 mg/dL

## 2017-11-26 LAB — PSA: PSA: 4.47 ng/mL — AB (ref 0.10–4.00)

## 2017-11-26 NOTE — Progress Notes (Signed)
Subjective:    Patient ID: Jimmy Day, male    DOB: 04/12/1961, 57 y.o.   MRN: [id number]  HPI  Jimmy Day is a 57 yr old male who presents today for complete physical.  Patient presents today for complete physical.  Immunizations: Td up to date, flu shot up to date, pneumovax up to date.  Diet: reports diet is healthy Wt Readings from Last 3 Encounters:  11/26/17 204 lb (92.5 kg)  08/13/17 194 lb (88 kg)  08/13/17 194 lb 9.6 oz (88.3 kg)  Exercise:  Has membership at gym but has not  Colonoscopy: due Vision: up to date Dental: up to date PSA:  Lab Results  Component Value Date   PSA 0.97 04/08/2009    Reports that he is followed at [facility]. Sees Jimmy Day. They follow him for low testosterone.  Reports that PSA was mildly elevated and was told that he needed to repeat this month. Would like drawn today.    Review of Systems  Constitutional: Positive for unexpected weight change.  HENT: Positive for tinnitus. Negative for hearing loss and rhinorrhea.   Eyes: Negative for visual disturbance.  Respiratory: Negative for cough.   Cardiovascular: Negative for leg swelling.  Gastrointestinal: Negative for blood in stool, constipation and diarrhea.  Genitourinary: Negative for difficulty urinating, dysuria, frequency and hematuria.  Musculoskeletal: Negative for arthralgias.       Some left shoulder pain/spasm for about 3-4 days following a work up  Skin: Negative for rash.  Neurological: Negative for headaches.  Hematological: Negative for adenopathy.  Psychiatric/Behavioral:       Denies depression/anxiety       Past Medical History:  Diagnosis Date  . Fasting hyperglycemia    NORMAL A1c  . Hyperlipidemia   . Hypertension   . Kappa light chain myeloma ([place]) 02/02/2014  . Testosterone deficiency    Dr Jimmy Day     Social History   Socioeconomic History  . Marital status: Married    Spouse name: Not on file  . Number of children: 3  . Years of  education: Not on file  . Highest education level: Not on file  Social Needs  . Financial resource strain: Not on file  . Food insecurity - worry: Not on file  . Food insecurity - inability: Not on file  . Transportation needs - medical: Not on file  . Transportation needs - non-medical: Not on file  Occupational History  . Occupation: Truck Scientist, product/process development  Tobacco Use  . Smoking status: Never Smoker  . Smokeless tobacco: Never Used  . Tobacco comment: never used tobacco  Substance and Sexual Activity  . Alcohol use: Yes    Alcohol/week: 0.0 oz    Comment:  rarely  . Drug use: No  . Sexual activity: Yes    Birth control/protection: Condom  Other Topics Concern  . 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 CERVICAL PLATE INSERTION     POST COMPRESSION DISC INJURY  . LIPOMAS REMOVAL     BENIGN FROM CHEST AND BACK    Family History  Problem Relation Age of Onset  . Breast cancer Mother   . Heart  attack Father 52  . Kidney disease Brother        RENAL FAILURE  . Hypertension Brother   . Heart attack Maternal Aunt 65       CABG  . Stroke Maternal Uncle        CVA  . Diabetes Neg Hx   . Colon cancer Neg Hx   . Esophageal cancer Neg Hx   . Rectal cancer Neg Hx   . Stomach cancer Neg Hx     No Known Allergies  Current Outpatient Medications on File Prior to Visit  Medication Sig Dispense Refill  . amLODipine (NORVASC) 5 MG tablet Take 1 tablet (5 mg total) by mouth daily. 30 tablet 5  . Cholecalciferol (VITAMIN D3) 3000 units TABS Take 1 tablet by mouth daily as needed. 30 tablet   . denosumab (XGEVA) 120 MG/1.7ML SOLN injection Inject 1.7 mLs (120 mg total) into the skin every 3 (three) months. 1.7 mL 4   . lenalidomide (REVLIMID) 10 MG capsule Daily for 21 days then 7 days off. [id number] 21 capsule 0  . VIAGRA 100 MG tablet TAKE 1 TABLET BY MOUTH DAILY AS NEEDED FOR ERECTILE DYSFUNCTION 4 tablet 5   No current facility-administered medications on file prior to visit.     BP 133/86 (BP Location: Right Arm, Patient Position: Sitting, Cuff Size: Large)   Pulse 65   Temp 98.3 F (36.8 C) (Oral)   Resp 16   Ht 5\' 11"  (1.803 m)   Wt 204 lb (92.5 kg)   SpO2 99%   BMI 28.45 kg/m    Objective:   Physical Exam Physical Exam  Constitutional: He is oriented to person, place, and time. He appears well-developed and well-nourished. No distress.  HENT:  Head: Normocephalic and atraumatic.  Right Ear: Tympanic membrane and ear canal normal.  Left Ear: Tympanic membrane and ear canal normal.  Mouth/Throat: Oropharynx is clear and moist.  Eyes: Pupils are equal, round, and reactive to light. No scleral icterus.  Neck: Normal range of motion. No thyromegaly present.  Cardiovascular: Normal rate and regular rhythm.   No murmur heard. Pulmonary/Chest: Effort normal and breath sounds normal. No respiratory distress. He has no wheezes. He has no rales. He exhibits no tenderness.  Abdominal: Soft. Bowel sounds are normal. He exhibits no distension and no mass. There is no tenderness. There is no rebound and no guarding.  Musculoskeletal: He exhibits no edema.  Lymphadenopathy:    He has no cervical adenopathy.  Neurological: He is alert and oriented to person, place, and time. He has normal patellar reflexes. He exhibits normal muscle tone. Coordination normal.  Skin: Skin is warm and dry.  Psychiatric: He has a normal mood and affect. His behavior is normal. Judgment and thought content normal.           Assessment & Plan:   Preventative Care- discussed healthy diet, regular exercise, weight loss.   Refer for colonoscopy. Immunizations reviewed and up to date. Advised heat and prn  tyleno as needed for shoulder pain and call if symptoms worsen or fail to improve. EKG is personally reviewed. Notes NSR with non-specific T wave abnormality. Appears unchanged when compared to previous EKG on file.      Assessment & Plan:

## 2017-11-26 NOTE — Patient Instructions (Addendum)
Please complete lab work prior to leaving. Work on adding regular exercise and diet. Goal weight about 190.

## 2017-11-29 ENCOUNTER — Encounter: Payer: Self-pay | Admitting: Family

## 2017-11-29 ENCOUNTER — Other Ambulatory Visit: Payer: Self-pay | Admitting: Family

## 2017-11-29 DIAGNOSIS — R972 Elevated prostate specific antigen [PSA]: Secondary | ICD-10-CM

## 2017-11-29 HISTORY — DX: Elevated prostate specific antigen (PSA): R97.20

## 2017-11-29 NOTE — Telephone Encounter (Signed)
Ok, also please advise about the EKG result and fish oil request?

## 2017-11-29 NOTE — Telephone Encounter (Signed)
Please contact pt and let him know that his PSA is  Mildly elevated. I would recommend referral to urology for further evaluation.

## 2017-11-29 NOTE — Telephone Encounter (Signed)
Also, his triglycerides are mildly elevated. Please work on avoiding concentrated sweets, and limiting white carbs (rice/bread/pasta/potatoes). Instead substitute whole grain versions with reasonable portions.

## 2017-11-29 NOTE — Telephone Encounter (Signed)
Notified pt and he voices understanding. He states he is already established with urology and will call to arrange appt. Pt also wants to know if he should start taking fish oil and if so, what dose?  Also wanted to know if his EKG was normal?  Advised pt we would respond back via mychart and he will be checking for it. Please advise?

## 2017-11-29 NOTE — Telephone Encounter (Signed)
Could you please fax a copy of PSA to Cardell Peach NP at [facility]?

## 2017-11-30 MED ORDER — FISH OIL 1000 MG PO CAPS: ORAL_CAPSULE | ORAL | 0 refills | Status: DC

## 2017-11-30 NOTE — Telephone Encounter (Signed)
Notified pt and he voices understanding. 

## 2017-11-30 NOTE — Telephone Encounter (Signed)
EKG looks fine-  It is unchanged compared to previous EKG. Normal rhythm  Fish oil may help 3000mg  BID

## 2017-12-02 ENCOUNTER — Telehealth: Payer: Self-pay | Admitting: Family

## 2017-12-02 ENCOUNTER — Inpatient Hospital Stay: Payer: 59 | Admitting: Hematology & Oncology

## 2017-12-02 ENCOUNTER — Telehealth: Payer: Self-pay

## 2017-12-02 ENCOUNTER — Inpatient Hospital Stay: Payer: 59

## 2017-12-02 ENCOUNTER — Encounter: Payer: Self-pay | Admitting: Internal Medicine

## 2017-12-02 MED ORDER — OMEGA-3-ACID ETHYL ESTERS 1 G PO CAPS: 2.0000 g | ORAL_CAPSULE | Freq: Two times a day (BID) | ORAL | 5 refills | Status: DC

## 2017-12-02 NOTE — Telephone Encounter (Signed)
PA initiated via Covermymeds; KEY: [id number]. Received real time PA approval.  [id number];Review Type:Prior Auth;Coverage Start Date:11/02/2017;Coverage End Date:12/01/2020;

## 2017-12-02 NOTE — Telephone Encounter (Signed)
Patient was advised at his las ov to start Fish oil 3000mg , he says he has not found any otc fish oil that is FDA approved and he will like to know if can can be given a rx for one that is approved.

## 2017-12-02 NOTE — Telephone Encounter (Signed)
Copied from [place] #[id number]. Topic: Quick Communication - See Telephone Encounter >> Dec 02, 2017  8:36 AM Clack, Laban Emperor wrote: CRM for notification. See Telephone encounter for:  Pt would like the provider or nurse to give him a call, he states he has some follow up questions from his CPE.  12/02/17.

## 2017-12-02 NOTE — Telephone Encounter (Signed)
rx sent for lovaza.

## 2017-12-03 MED FILL — OMEGA-3 ETHYL ESTERS 1 GM C: 1 | 15 days supply | Qty: 60 | Fill #0

## 2017-12-03 NOTE — Telephone Encounter (Signed)
Patient advised rx was sent to his pharmacy for Lovaza. Wanted to let Melissa know he made appointment with [facility]. His PSA results were faxed to them yesterday.

## 2017-12-06 ENCOUNTER — Other Ambulatory Visit: Payer: Self-pay | Admitting: *Deleted

## 2017-12-06 MED ORDER — LACTULOSE 20 GM/30ML PO SOLN: 30.0000 mL | ORAL | 2 refills | Status: DC | PRN

## 2017-12-06 MED FILL — GENERLAC 10 GM/15 ML SOLN: 10 | 2 days supply | Qty: 236 | Fill #0

## 2017-12-20 ENCOUNTER — Other Ambulatory Visit: Payer: Self-pay

## 2017-12-20 ENCOUNTER — Encounter: Payer: Self-pay | Admitting: Family

## 2017-12-20 ENCOUNTER — Inpatient Hospital Stay: Payer: 59

## 2017-12-20 ENCOUNTER — Inpatient Hospital Stay: Payer: 59 | Attending: Hematology & Oncology | Admitting: Family

## 2017-12-20 VITALS — BP 133/79 | HR 79 | Temp 97.9°F | Resp 16 | Wt 204.0 lb

## 2017-12-20 DIAGNOSIS — C9000 Multiple myeloma not having achieved remission: Secondary | ICD-10-CM | POA: Diagnosis not present

## 2017-12-20 DIAGNOSIS — Z79899 Other long term (current) drug therapy: Secondary | ICD-10-CM | POA: Diagnosis not present

## 2017-12-20 DIAGNOSIS — R972 Elevated prostate specific antigen [PSA]: Secondary | ICD-10-CM | POA: Insufficient documentation

## 2017-12-20 DIAGNOSIS — Z9484 Stem cells transplant status: Secondary | ICD-10-CM

## 2017-12-20 DIAGNOSIS — Z9481 Bone marrow transplant status: Secondary | ICD-10-CM

## 2017-12-20 LAB — CMP (CANCER CENTER ONLY)
ALT: 15 U/L (ref 0–55)
AST: 21 U/L (ref 5–34)
Albumin: 3.9 g/dL (ref 3.5–5.0)
Alkaline Phosphatase: 40 U/L (ref 40–150)
Anion gap: 8 (ref 3–11)
BILIRUBIN TOTAL: 0.8 mg/dL (ref 0.2–1.2)
BUN: 14 mg/dL (ref 7–26)
CO2: 26 mmol/L (ref 22–29)
Calcium: 8.9 mg/dL (ref 8.4–10.4)
Chloride: 103 mmol/L (ref 98–109)
Creatinine: 1.22 mg/dL (ref 0.70–1.30)
Glucose, Bld: 111 mg/dL (ref 70–140)
POTASSIUM: 3.8 mmol/L (ref 3.5–5.1)
Sodium: 137 mmol/L (ref 136–145)
TOTAL PROTEIN: 7.2 g/dL (ref 6.4–8.3)

## 2017-12-20 LAB — CBC WITH DIFFERENTIAL (CANCER CENTER ONLY)
BASOS ABS: 0 10*3/uL (ref 0.0–0.1)
Basophils Relative: 1 %
EOS PCT: 2 %
Eosinophils Absolute: 0.1 10*3/uL (ref 0.0–0.5)
HEMATOCRIT: 37.8 % — AB (ref 38.7–49.9)
Hemoglobin: 13.1 g/dL (ref 13.0–17.1)
LYMPHS ABS: 1.6 10*3/uL (ref 0.9–3.3)
LYMPHS PCT: 36 %
MCH: 32.1 pg (ref 28.0–33.4)
MCHC: 34.7 g/dL (ref 32.0–35.9)
MCV: 92.6 fL (ref 82.0–98.0)
MONO ABS: 0.4 10*3/uL (ref 0.1–0.9)
Monocytes Relative: 8 %
NEUTROS ABS: 2.3 10*3/uL (ref 1.5–6.5)
Neutrophils Relative %: 53 %
PLATELETS: 244 10*3/uL (ref 140–400)
RBC: 4.08 MIL/uL — ABNORMAL LOW (ref 4.20–5.70)
RDW: 13 % (ref 11.1–15.7)
WBC Count: 4.4 10*3/uL (ref 4.0–10.3)

## 2017-12-20 MED ORDER — DENOSUMAB 120 MG/1.7ML ~~LOC~~ SOLN
120.0000 mg | Freq: Once | SUBCUTANEOUS | Status: AC
  Administered 2017-12-20: 120 mg via SUBCUTANEOUS

## 2017-12-20 MED FILL — AMLODIPINE BESYLATE 5 MG TA: 5 | 30 days supply | Qty: 30 | Fill #4

## 2017-12-20 NOTE — Patient Instructions (Signed)

## 2017-12-21 LAB — IGG, IGA, IGM
IGM (IMMUNOGLOBULIN M), SRM: 24 mg/dL (ref 20–172)
IgA: 17 mg/dL — ABNORMAL LOW (ref 90–386)
IgG (Immunoglobin G), Serum: 1590 mg/dL (ref 700–1600)

## 2017-12-21 LAB — KAPPA/LAMBDA LIGHT CHAINS
KAPPA, LAMDA LIGHT CHAIN RATIO: 1.56 (ref 0.26–1.65)
Kappa free light chain: 19.4 mg/L (ref 3.3–19.4)
Lambda free light chains: 12.4 mg/L (ref 5.7–26.3)

## 2017-12-21 NOTE — Progress Notes (Signed)
Hematology and Oncology Follow Up Visit  [facility] [id number] 1961/08/09 57 y.o. 12/21/2017   Principle Diagnosis:  IgG Kappa myeloma  Current Therapy:   Revlimid 10 mg daily (21/7) Xgeva 120 mg sq every 3 months  Status post autologous stem cell transplant (April 2016)   Interim History:  Jimmy Day is here today for follow-up. He was seen at [facility] by Jimmy Day in October and told them he would like to follow-up as needed from then on due to finances.  In September no M-spike was noted, IgG 1,630 and kappa light chain was 1.96. Today's resulted are pending.  He is doing well but states his PSA has gone up and is scheduled for biopsy of the prostate on March 8th.  He denies fatigue.  He has had no fever, chills, n/v, cough, rash, dizziness, SOB, chest pain, palpitations, abdominal pain or changes in bowel or bladder habits.  No episodes of bleeding, no bruising or petechiae. No lymphadenopathy found on exam.  No swelling, tenderness, numbness or tingling in her extremities. No c.o pain.  He is exercising daily.  He has maintained a good appetite and is staying well hydrated. Her weight is stable.   ECOG Performance Status: 0 - Asymptomatic  Medications:  Allergies as of 12/20/2017   No Known Allergies     Medication List        Accurate as of 12/20/17 11:59 PM. Always use your most recent med list.          amLODipine 5 MG tablet Commonly known as:  NORVASC Take 1 tablet (5 mg total) by mouth daily.   denosumab 120 MG/1.7ML Soln injection Commonly known as:  XGEVA Inject 1.7 mLs (120 mg total) into the skin every 3 (three) months.   Lactulose 20 GM/30ML Soln Take 30 mLs (20 g total) by mouth every 4 (four) hours as needed (Take as directed until bowel movement.).   lenalidomide 10 MG capsule Commonly known as:  REVLIMID Daily for 21 days then 7 days off. [id number]   omega-3 acid ethyl esters 1 g capsule Commonly known as:  LOVAZA Take 2 capsules (2 g total) by  mouth 2 (two) times daily.   VIAGRA 100 MG tablet Generic drug:  sildenafil TAKE 1 TABLET BY MOUTH DAILY AS NEEDED FOR ERECTILE DYSFUNCTION   Vitamin D3 3000 units Tabs Take 1 tablet by mouth daily as needed.       Allergies: No Known Allergies  Past Medical History, Surgical history, Social history, and Family History were reviewed and updated.  Review of Systems: All other 10 point review of systems is negative.   Physical Exam:  weight is 204 lb (92.5 kg). His oral temperature is 97.9 F (36.6 C). His blood pressure is 133/79 and his pulse is 79. His respiration is 16 and oxygen saturation is 100%.   Wt Readings from Last 3 Encounters:  12/20/17 204 lb (92.5 kg)  11/26/17 204 lb (92.5 kg)  08/13/17 194 lb (88 kg)    Ocular: Sclerae unicteric, pupils equal, round and reactive to light Ear-nose-throat: Oropharynx clear, dentition fair Lymphatic: No cervical, supraclavicular or axillary adenopathy Lungs no rales or rhonchi, good excursion bilaterally Heart regular rate and rhythm, no murmur appreciated Abd soft, nontender, positive bowel sounds, no liver or spleen tip palpated on exam, no fluid wave  MSK no focal spinal tenderness, no joint edema Neuro: non-focal, well-oriented, appropriate affect Breasts: Deferred   Lab Results  Component Value Date   WBC 4.4  12/20/2017   HGB 14.4 11/26/2017   HCT 37.8 (L) 12/20/2017   MCV 92.6 12/20/2017   PLT 244 12/20/2017   Lab Results  Component Value Date   FERRITIN 152 08/17/2014   IRON 106 08/17/2014   TIBC 253 08/17/2014   UIBC 146 08/17/2014   IRONPCTSAT 42 08/17/2014   Lab Results  Component Value Date   RBC 4.08 (L) 12/20/2017   Lab Results  Component Value Date   KPAFRELGTCHN 2.34 (H) 10/04/2015   LAMBDASER 1.29 10/04/2015   KAPLAMBRATIO 1.49 08/13/2017   Lab Results  Component Value Date   IGGSERUM 1,590 12/20/2017   IGA 17 (L) 12/20/2017   IGMSERUM 24 12/20/2017   Lab Results  Component Value  Date   TOTALPROTELP 6.8 10/04/2015   ALBUMINELP 4.3 10/04/2015   A1GS 0.4 (H) 10/04/2015   A2GS 0.5 10/04/2015   BETS 0.4 10/04/2015   BETA2SER 0.2 10/04/2015   GAMS 1.0 10/04/2015   MSPIKE Not Observed 08/13/2017   SPEI * 10/04/2015     Chemistry      Component Value Date/Time   NA 137 12/20/2017 1304   NA 139 08/13/2017 0918   NA 142 04/01/2015 0834   K 3.8 12/20/2017 1304   K 3.6 08/13/2017 0918   K 3.4 (L) 04/01/2015 0834   CL 103 12/20/2017 1304   CL 105 08/13/2017 0918   CO2 26 12/20/2017 1304   CO2 28 08/13/2017 0918   CO2 20 (L) 04/01/2015 0834   BUN 14 12/20/2017 1304   BUN 8 08/13/2017 0918   BUN 10.4 04/01/2015 0834   CREATININE 1.20 11/26/2017 0852   CREATININE 1.4 (H) 08/13/2017 0918   CREATININE 1.2 04/01/2015 0834      Component Value Date/Time   CALCIUM 8.9 12/20/2017 1304   CALCIUM 9.1 08/13/2017 0918   CALCIUM 8.6 04/01/2015 0834   ALKPHOS 40 12/20/2017 1304   ALKPHOS 42 08/13/2017 0918   ALKPHOS 39 (L) 04/01/2015 0834   AST 21 12/20/2017 1304   AST 17 04/01/2015 0834   ALT 15 12/20/2017 1304   ALT 21 08/13/2017 0918   ALT 19 04/01/2015 0834   BILITOT 0.8 12/20/2017 1304   BILITOT 0.56 04/01/2015 0834      Impression and Plan: Jimmy Day is a pleasant 57 yo African American gentleman with IgG kappa myeloma s/p autologous stem cell transplant in April 2016. He continues to do well and is now followed as needed by [facility].  Myeloma studies are pending.  He will get his Delton See today as planned.  He is doing well on Revlimid and has complaints at this time.  He is scheduled for prostate biopsy on March 8th.  We will go ahead and plan to see him back again in another 3 months for repeat lab work, follow-up and injection.  He will contact our office with any questions or concerns. We can certainly see him sooner if need be.   Laverna Peace, NP 1/29/20199:44 AM

## 2017-12-22 LAB — PROTEIN ELECTROPHORESIS, SERUM, WITH REFLEX
A/G RATIO SPE: 1.4 (ref 0.7–1.7)
ALBUMIN ELP: 3.9 g/dL (ref 2.9–4.4)
ALPHA-1-GLOBULIN: 0.2 g/dL (ref 0.0–0.4)
Alpha-2-Globulin: 0.4 g/dL (ref 0.4–1.0)
BETA GLOBULIN: 0.8 g/dL (ref 0.7–1.3)
Gamma Globulin: 1.4 g/dL (ref 0.4–1.8)
Globulin, Total: 2.8 g/dL (ref 2.2–3.9)
TOTAL PROTEIN ELP: 6.7 g/dL (ref 6.0–8.5)

## 2017-12-28 ENCOUNTER — Other Ambulatory Visit: Payer: Self-pay | Admitting: *Deleted

## 2017-12-28 DIAGNOSIS — C9000 Multiple myeloma not having achieved remission: Secondary | ICD-10-CM

## 2017-12-28 MED ORDER — LENALIDOMIDE 10 MG PO CAPS: ORAL_CAPSULE | ORAL | 0 refills | Status: DC

## 2018-01-07 ENCOUNTER — Other Ambulatory Visit: Payer: Self-pay

## 2018-01-07 ENCOUNTER — Ambulatory Visit (AMBULATORY_SURGERY_CENTER): Payer: Self-pay

## 2018-01-07 ENCOUNTER — Telehealth: Payer: Self-pay

## 2018-01-07 VITALS — Ht 71.0 in | Wt 208.2 lb

## 2018-01-07 DIAGNOSIS — Z1211 Encounter for screening for malignant neoplasm of colon: Secondary | ICD-10-CM

## 2018-01-07 MED ORDER — NA SULFATE-K SULFATE-MG SULF 17.5-3.13-1.6 GM/177ML PO SOLN: 1.0000 | Freq: Once | ORAL | 0 refills | Status: AC

## 2018-01-07 MED FILL — SUPREP BOWEL PREP KIT: 17.5-3.13-1 | 30 days supply | Qty: 354 | Fill #0

## 2018-01-07 NOTE — Telephone Encounter (Signed)
Dr. Henrene Pastor, I saw this pt in pre-visit today. He has a history of Kappa Light chain myeloma. He received chemo in 2015 and currently takes a daily oral chemo. He is doing great, per pt no issues. I just wanted you to be aware and make sure he is okay to proceed with a direct colon 01-20-18.  Please advise Thanks for your time, Sharyn Lull PV

## 2018-01-07 NOTE — Progress Notes (Signed)
No egg or soy allergy known to patient  No issues with past sedation with any surgeries  or procedures, no intubation problems  No diet pills per patient No home 02 use per patient  No blood thinners per patient  Pt denies issues with constipation  No A fib or A flutter  EMMI video sent to pt's e mail video sent during pre-visit

## 2018-01-10 NOTE — Telephone Encounter (Signed)
Okay for direct. Thanks for checking

## 2018-01-10 NOTE — Telephone Encounter (Signed)
Noted  

## 2018-01-13 MED FILL — OMEGA-3 ETHYL ESTERS 1 GM C: 1 | 15 days supply | Qty: 60 | Fill #1

## 2018-01-13 MED FILL — AMLODIPINE BESYLATE 5 MG TA: 5 | 30 days supply | Qty: 30 | Fill #5

## 2018-01-20 ENCOUNTER — Ambulatory Visit (AMBULATORY_SURGERY_CENTER): Payer: 59 | Admitting: Internal Medicine

## 2018-01-20 ENCOUNTER — Other Ambulatory Visit: Payer: Self-pay

## 2018-01-20 ENCOUNTER — Encounter: Payer: Self-pay | Admitting: Internal Medicine

## 2018-01-20 VITALS — BP 123/87 | HR 71 | Temp 97.8°F | Resp 16 | Ht 71.0 in | Wt 208.0 lb

## 2018-01-20 DIAGNOSIS — Z1211 Encounter for screening for malignant neoplasm of colon: Secondary | ICD-10-CM

## 2018-01-20 DIAGNOSIS — Z1212 Encounter for screening for malignant neoplasm of rectum: Secondary | ICD-10-CM | POA: Diagnosis not present

## 2018-01-20 MED ORDER — SODIUM CHLORIDE 0.9 % IV SOLN: 500.0000 mL | Freq: Once | INTRAVENOUS | Status: DC

## 2018-01-20 NOTE — Op Note (Signed)
[facility] Patient Name: Jimmy Day Procedure Date: 01/20/2018 10:45 AM MRN: [id number] Endoscopist: Docia Chuck. Henrene Pastor , MD Age: 57 Referring MD:  Date of Birth: 07/12/1961 Gender: Male Account #: [id number] Procedure:                Colonoscopy Indications:              Screening for colorectal malignant neoplasm Medicines:                Monitored Anesthesia Care Procedure:                Pre-Anesthesia Assessment:                           - Prior to the procedure, a History and Physical                            was performed, and patient medications and                            allergies were reviewed. The patient's tolerance of                            previous anesthesia was also reviewed. The risks                            and benefits of the procedure and the sedation                            options and risks were discussed with the patient.                            All questions were answered, and informed consent                            was obtained. Prior Anticoagulants: The patient has                            taken no previous anticoagulant or antiplatelet                            agents. ASA Grade Assessment: II - A patient with                            mild systemic disease. After reviewing the risks                            and benefits, the patient was deemed in                            satisfactory condition to undergo the procedure.                           After obtaining informed consent, the colonoscope  was passed under direct vision. Throughout the                            procedure, the patient's blood pressure, pulse, and                            oxygen saturations were monitored continuously. The                            Colonoscope was introduced through the anus and                            advanced to the the cecum, identified by                            appendiceal orifice and  ileocecal valve. The                            ileocecal valve, appendiceal orifice, and rectum                            were photographed. The quality of the bowel                            preparation was excellent. The colonoscopy was                            performed without difficulty. The patient tolerated                            the procedure well. The bowel preparation used was                            SUPREP. Scope In: 10:52:38 AM Scope Out: 11:11:02 AM Scope Withdrawal Time: 0 hours 13 minutes 30 seconds  Total Procedure Duration: 0 hours 18 minutes 24 seconds  Findings:                 Internal hemorrhoids were found during retroflexion.                           The exam was otherwise without abnormality on                            direct and retroflexion views. Complications:            No immediate complications. Estimated blood loss:                            None. Estimated Blood Loss:     Estimated blood loss: none. Impression:               - Internal hemorrhoids.                           - The examination was otherwise normal on direct  and retroflexion views.                           - No specimens collected. Recommendation:           - Repeat colonoscopy in 10 years for screening                            purposes.                           - Patient has a contact number available for                            emergencies. The signs and symptoms of potential                            delayed complications were discussed with the                            patient. Return to normal activities tomorrow.                            Written discharge instructions were provided to the                            patient.                           - Resume previous diet.                           - Continue present medications. Docia Chuck. Henrene Pastor, MD 01/20/2018 11:17:13 AM This report has been signed electronically.

## 2018-01-20 NOTE — Progress Notes (Signed)
Pt's states no medical or surgical changes since previsit or office visit. 

## 2018-01-20 NOTE — Progress Notes (Signed)
A and O x3. Report to RN. Tolerated MAC anesthesia well.

## 2018-01-20 NOTE — Patient Instructions (Signed)
YOU HAD AN ENDOSCOPIC PROCEDURE TODAY AT THE [facility]:   Refer to the procedure report that was given to you for any specific questions about what was found during the examination.  If the procedure report does not answer your questions, please call your gastroenterologist to clarify.  If you requested that your care partner not be given the details of your procedure findings, then the procedure report has been included in a sealed envelope for you to review at your convenience later.  YOU SHOULD EXPECT: Some feelings of bloating in the abdomen. Passage of more gas than usual.  Walking can help get rid of the air that was put into your GI tract during the procedure and reduce the bloating. If you had a lower endoscopy (such as a colonoscopy or flexible sigmoidoscopy) you may notice spotting of blood in your stool or on the toilet paper. If you underwent a bowel prep for your procedure, you may not have a normal bowel movement for a few days.  Please Note:  You might notice some irritation and congestion in your nose or some drainage.  This is from the oxygen used during your procedure.  There is no need for concern and it should clear up in a day or so.  SYMPTOMS TO REPORT IMMEDIATELY:   Following lower endoscopy (colonoscopy or flexible sigmoidoscopy):  Excessive amounts of blood in the stool  Significant tenderness or worsening of abdominal pains  Swelling of the abdomen that is new, acute  Fever of 100F or higher  For urgent or emergent issues, a gastroenterologist can be reached at any hour by calling [phone].   DIET:  We do recommend a small meal at first, but then you may proceed to your regular diet.  Drink plenty of fluids but you should avoid alcoholic beverages for 24 hours.  ACTIVITY:  You should plan to take it easy for the rest of today and you should NOT DRIVE or use heavy machinery until tomorrow (because of the sedation medicines used during the test).     FOLLOW UP: Our staff will call the number listed on your records the next business day following your procedure to check on you and address any questions or concerns that you may have regarding the information given to you following your procedure. If we do not reach you, we will leave a message.  However, if you are feeling well and you are not experiencing any problems, there is no need to return our call.  We will assume that you have returned to your regular daily activities without incident.  If any biopsies were taken you will be contacted by phone or by letter within the next 1-3 weeks.  Please call us at [phone] if you have not heard about the biopsies in 3 weeks.    SIGNATURES/CONFIDENTIALITY: You and/or your care partner have signed paperwork which will be entered into your electronic medical record.  These signatures attest to the fact that that the information above on your After Visit Summary has been reviewed and is understood.  Full responsibility of the confidentiality of this discharge information lies with you and/or your care-partner. 

## 2018-01-21 ENCOUNTER — Telehealth: Payer: Self-pay | Admitting: *Deleted

## 2018-01-21 NOTE — Telephone Encounter (Signed)
  Follow up Call-  Call back number 01/20/2018  Post procedure Call Back phone  # [phone]  Permission to leave phone message Yes  Some recent data might be hidden     Patient questions:  Do you have a fever, pain , or abdominal swelling? No. Pain Score  0 *  Have you tolerated food without any problems? Yes.    Have you been able to return to your normal activities? Yes.    Do you have any questions about your discharge instructions: Diet   No. Medications no Follow up visit  No.  Do you have questions or concerns about your Care? No.  Actions: * If pain score is 4 or above: No action needed, pain <4.

## 2018-01-24 MED FILL — HYDROCODON-APAP 5-325: 5-325 | 1 days supply | Qty: 1 | Fill #0

## 2018-01-24 MED FILL — levoFLOXacin 500 MG TABS: 500 | 3 days supply | Qty: 3 | Fill #0

## 2018-01-31 ENCOUNTER — Other Ambulatory Visit: Payer: Self-pay | Admitting: *Deleted

## 2018-01-31 DIAGNOSIS — C9000 Multiple myeloma not having achieved remission: Secondary | ICD-10-CM

## 2018-01-31 MED ORDER — LENALIDOMIDE 10 MG PO CAPS: ORAL_CAPSULE | ORAL | 0 refills | Status: DC

## 2018-02-21 ENCOUNTER — Other Ambulatory Visit: Payer: Self-pay | Admitting: *Deleted

## 2018-02-21 DIAGNOSIS — C9000 Multiple myeloma not having achieved remission: Secondary | ICD-10-CM

## 2018-02-21 MED ORDER — LENALIDOMIDE 10 MG PO CAPS: ORAL_CAPSULE | ORAL | 0 refills | Status: DC

## 2018-02-23 MED FILL — OMEGA-3 ETHYL ESTERS 1 GM C: 1 | 15 days supply | Qty: 60 | Fill #2

## 2018-02-23 MED FILL — AMLODIPINE BESYLATE 5 MG TA: 5 | 30 days supply | Qty: 30 | Fill #3

## 2018-02-23 MED FILL — SILDENAFIL CITRATE 100 MG T: 100 | 29 days supply | Qty: 4 | Fill #2

## 2018-03-17 ENCOUNTER — Other Ambulatory Visit: Payer: Self-pay | Admitting: *Deleted

## 2018-03-17 DIAGNOSIS — C9000 Multiple myeloma not having achieved remission: Secondary | ICD-10-CM

## 2018-03-17 MED ORDER — LENALIDOMIDE 10 MG PO CAPS: ORAL_CAPSULE | ORAL | 0 refills | Status: DC

## 2018-03-18 ENCOUNTER — Inpatient Hospital Stay: Payer: 59 | Attending: Hematology & Oncology

## 2018-03-18 ENCOUNTER — Inpatient Hospital Stay: Payer: 59 | Admitting: Hematology & Oncology

## 2018-03-18 ENCOUNTER — Inpatient Hospital Stay: Payer: 59

## 2018-03-21 ENCOUNTER — Ambulatory Visit: Payer: 59 | Admitting: Hematology & Oncology

## 2018-03-21 ENCOUNTER — Inpatient Hospital Stay: Payer: 59

## 2018-03-21 ENCOUNTER — Other Ambulatory Visit: Payer: 59

## 2018-03-29 MED FILL — AMLODIPINE BESYLATE 5 MG TA: 5 | 30 days supply | Qty: 30 | Fill #4

## 2018-04-08 ENCOUNTER — Inpatient Hospital Stay: Payer: 59 | Attending: Hematology & Oncology

## 2018-04-08 ENCOUNTER — Encounter: Payer: Self-pay | Admitting: Family

## 2018-04-08 ENCOUNTER — Inpatient Hospital Stay: Payer: 59

## 2018-04-08 ENCOUNTER — Inpatient Hospital Stay (HOSPITAL_BASED_OUTPATIENT_CLINIC_OR_DEPARTMENT_OTHER): Payer: 59 | Admitting: Family

## 2018-04-08 ENCOUNTER — Other Ambulatory Visit: Payer: Self-pay

## 2018-04-08 VITALS — BP 123/71 | HR 57 | Temp 97.9°F | Resp 16 | Wt 208.0 lb

## 2018-04-08 DIAGNOSIS — C9000 Multiple myeloma not having achieved remission: Secondary | ICD-10-CM

## 2018-04-08 DIAGNOSIS — Z9484 Stem cells transplant status: Secondary | ICD-10-CM | POA: Diagnosis not present

## 2018-04-08 DIAGNOSIS — Z7982 Long term (current) use of aspirin: Secondary | ICD-10-CM | POA: Insufficient documentation

## 2018-04-08 DIAGNOSIS — Z79899 Other long term (current) drug therapy: Secondary | ICD-10-CM | POA: Insufficient documentation

## 2018-04-08 DIAGNOSIS — Z9481 Bone marrow transplant status: Secondary | ICD-10-CM

## 2018-04-08 LAB — CMP (CANCER CENTER ONLY)
ALT: 31 U/L (ref 10–47)
ANION GAP: 4 — AB (ref 5–15)
AST: 27 U/L (ref 11–38)
Albumin: 3.9 g/dL (ref 3.5–5.0)
Alkaline Phosphatase: 45 U/L (ref 26–84)
BILIRUBIN TOTAL: 1 mg/dL (ref 0.2–1.6)
BUN: 10 mg/dL (ref 7–22)
CO2: 27 mmol/L (ref 18–33)
Calcium: 8.9 mg/dL (ref 8.0–10.3)
Chloride: 107 mmol/L (ref 98–108)
Creatinine: 1.2 mg/dL (ref 0.60–1.20)
Glucose, Bld: 117 mg/dL (ref 73–118)
POTASSIUM: 3.5 mmol/L (ref 3.3–4.7)
Sodium: 138 mmol/L (ref 128–145)
Total Protein: 7.2 g/dL (ref 6.4–8.1)

## 2018-04-08 LAB — CBC WITH DIFFERENTIAL (CANCER CENTER ONLY)
BASOS ABS: 0.1 10*3/uL (ref 0.0–0.1)
BASOS PCT: 2 %
Eosinophils Absolute: 0.2 10*3/uL (ref 0.0–0.5)
Eosinophils Relative: 5 %
HEMATOCRIT: 41.7 % (ref 38.7–49.9)
HEMOGLOBIN: 15 g/dL (ref 13.0–17.1)
LYMPHS PCT: 40 %
Lymphs Abs: 1.4 10*3/uL (ref 0.9–3.3)
MCH: 32.2 pg (ref 28.0–33.4)
MCHC: 36 g/dL — AB (ref 32.0–35.9)
MCV: 89.5 fL (ref 82.0–98.0)
MONOS PCT: 11 %
Monocytes Absolute: 0.4 10*3/uL (ref 0.1–0.9)
NEUTROS ABS: 1.5 10*3/uL (ref 1.5–6.5)
NEUTROS PCT: 42 %
Platelet Count: 213 10*3/uL (ref 145–400)
RBC: 4.66 MIL/uL (ref 4.20–5.70)
RDW: 13.3 % (ref 11.1–15.7)
WBC Count: 3.4 10*3/uL — ABNORMAL LOW (ref 4.0–10.0)

## 2018-04-08 MED ORDER — DENOSUMAB 120 MG/1.7ML ~~LOC~~ SOLN
120.0000 mg | Freq: Once | SUBCUTANEOUS | Status: AC
  Administered 2018-04-08: 120 mg via SUBCUTANEOUS

## 2018-04-08 MED FILL — OMEGA-3 ETHYL ESTERS 1 GM C: 1 | 15 days supply | Qty: 60 | Fill #3

## 2018-04-08 NOTE — Patient Instructions (Signed)

## 2018-04-08 NOTE — Progress Notes (Signed)
Hematology and Oncology Follow Up Visit  Jim[facility] Day [facility] [id number] [birth year] 57 y.o. 04/08/2018   Principle Diagnosis:  IgG Kappa myeloma  Current Therapy:   Revlimid 10 mg daily (21/7) Xgeva 120 mg sq every 3 months  Status post autologous stem cell transplant (April 2016)   Interim History:  Jimmy Day is here today for follow-up. He is doing well and has no complaints at this time. He is doing well on Revlimid and verbalized that he is taking as prescribed.  In January, No M-spike was observed, 1,590 mg/dL and kappa light chains 1.94 mg/dL.   He states that his prostate biopsy in March was negative as was his colonoscopy.  No issue with infections. No fever, chills, n/v, cough, rash, dizziness, SOB, chest pain, palpitations, abdominal pain or changes in bowel or bladder habits.  No swelling, tenderness, numbness or tingling in his extremities. No c/o pain.  He has a good appetite and is staying well hydrated. His weight is stable.  He is staying active playing basketball and golf regularly.   ECOG Performance Status: 0 - Asymptomatic  Medications:  Allergies as of 04/08/2018   No Known Allergies     Medication List        Accurate as of 04/08/18  3:41 PM. Always use your most recent med list.          amLODipine 5 MG tablet Commonly known as:  NORVASC Take 1 tablet (5 mg total) by mouth daily.   aspirin 325 MG EC tablet Take 325 mg by mouth daily.   Lactulose 20 GM/30ML Soln Take 30 mLs (20 g total) by mouth every 4 (four) hours as needed (Take as directed until bowel movement.).   lenalidomide 10 MG capsule Commonly known as:  REVLIMID Daily for 21 days then 7 days off. [id number]   omega-3 acid ethyl esters 1 g capsule Commonly known as:  LOVAZA Take 2 capsules (2 g total) by mouth 2 (two) times daily.   VIAGRA 100 MG tablet Generic drug:  sildenafil TAKE 1 TABLET BY MOUTH DAILY AS NEEDED FOR ERECTILE DYSFUNCTION   Vitamin D3 3000 units Tabs Take 1  tablet by mouth daily as needed.       Allergies: No Known Allergies  Past Medical History, Surgical history, Social history, and Family History were reviewed and updated.  Review of Systems: All other 10 point review of systems is negative.   Physical Exam:  weight is 208 lb (94.3 kg). His oral temperature is 97.9 F (36.6 C). His blood pressure is 123/71 and his pulse is 57 (abnormal). His respiration is 16 and oxygen saturation is 100%.   Wt Readings from Last 3 Encounters:  04/08/18 208 lb (94.3 kg)  01/20/18 208 lb (94.3 kg)  01/07/18 208 lb 3.2 oz (94.4 kg)    Ocular: Sclerae unicteric, pupils equal, round and reactive to light Ear-nose-throat: Oropharynx clear, dentition fair Lymphatic: No cervical, supraclavicular or axillary adenopathy Lungs no rales or rhonchi, good excursion bilaterally Heart regular rate and rhythm, no murmur appreciated Abd soft, nontender, positive bowel sounds, No liver or spleen tip palpated on exam, no fluid wave  MSK no focal spinal tenderness, no joint edema Neuro: non-focal, well-oriented, appropriate affect Breasts: Deferred   Lab Results  Component Value Date   WBC 3.4 (L) 04/08/2018   HGB 15.0 04/08/2018   HCT 41.7 04/08/2018   MCV 89.5 04/08/2018   PLT 213 04/08/2018   Lab Results  Component Value Date   FERRITIN  152 08/17/2014   IRON 106 08/17/2014   TIBC 253 08/17/2014   UIBC 146 08/17/2014   IRONPCTSAT 42 08/17/2014   Lab Results  Component Value Date   RBC 4.66 04/08/2018   Lab Results  Component Value Date   KPAFRELGTCHN 19.4 12/20/2017   LAMBDASER 12.4 12/20/2017   KAPLAMBRATIO 1.56 12/20/2017   Lab Results  Component Value Date   IGGSERUM 1,590 12/20/2017   IGA 17 (L) 12/20/2017   IGMSERUM 24 12/20/2017   Lab Results  Component Value Date   TOTALPROTELP 6.7 12/20/2017   ALBUMINELP 3.9 12/20/2017   A1GS 0.2 12/20/2017   A2GS 0.4 12/20/2017   BETS 0.8 12/20/2017   BETA2SER 0.2 10/04/2015   GAMS 1.4  12/20/2017   MSPIKE Not Observed 12/20/2017   SPEI * 10/04/2015     Chemistry      Component Value Date/Time   NA 138 04/08/2018 1455   NA 139 08/13/2017 0918   NA 142 04/01/2015 0834   K 3.5 04/08/2018 1455   K 3.6 08/13/2017 0918   K 3.4 (L) 04/01/2015 0834   CL 107 04/08/2018 1455   CL 105 08/13/2017 0918   CO2 27 04/08/2018 1455   CO2 28 08/13/2017 0918   CO2 20 (L) 04/01/2015 0834   BUN 10 04/08/2018 1455   BUN 8 08/13/2017 0918   BUN 10.4 04/01/2015 0834   CREATININE 1.20 04/08/2018 1455   CREATININE 1.4 (H) 08/13/2017 0918   CREATININE 1.2 04/01/2015 0834      Component Value Date/Time   CALCIUM 8.9 04/08/2018 1455   CALCIUM 9.1 08/13/2017 0918   CALCIUM 8.6 04/01/2015 0834   ALKPHOS 45 04/08/2018 1455   ALKPHOS 42 08/13/2017 0918   ALKPHOS 39 (L) 04/01/2015 0834   AST 27 04/08/2018 1455   AST 17 04/01/2015 0834   ALT 31 04/08/2018 1455   ALT 21 08/13/2017 0918   ALT 19 04/01/2015 0834   BILITOT 1.0 04/08/2018 1455   BILITOT 0.56 04/01/2015 0834      Impression and Plan: Jimmy Day is a pleasant 57 yo African American gentleman with IgG kappa myeloma s/p autologous stem cell transplant in April 2016. He is doing well and is asymptomatic at this time.  His myeloma studies have remained stable. Results for today are stable.  He will continue his same regimen with Revlimid.  He received his Delton See today.  He requested his follow-ups be every 6 months. We will do lab and Xgeva in 3 months and follow-up with lan and injection in 6 months.  He will contact our office with any questions or concerns. We can certainly see him sooner if need be.   Laverna Peace, NP 5/17/20193:41 PM

## 2018-04-09 LAB — IGG, IGA, IGM
IGG (IMMUNOGLOBIN G), SERUM: 1638 mg/dL — AB (ref 700–1600)
IGM (IMMUNOGLOBULIN M), SRM: 19 mg/dL — AB (ref 20–172)
IgA: 15 mg/dL — ABNORMAL LOW (ref 90–386)

## 2018-04-11 LAB — KAPPA/LAMBDA LIGHT CHAINS
KAPPA, LAMDA LIGHT CHAIN RATIO: 1.67 — AB (ref 0.26–1.65)
Kappa free light chain: 21.1 mg/L — ABNORMAL HIGH (ref 3.3–19.4)
LAMDA FREE LIGHT CHAINS: 12.6 mg/L (ref 5.7–26.3)

## 2018-04-13 LAB — PROTEIN ELECTROPHORESIS, SERUM
A/G RATIO SPE: 1.3 (ref 0.7–1.7)
ALBUMIN ELP: 3.9 g/dL (ref 2.9–4.4)
ALPHA-1-GLOBULIN: 0.2 g/dL (ref 0.0–0.4)
Alpha-2-Globulin: 0.5 g/dL (ref 0.4–1.0)
Beta Globulin: 0.9 g/dL (ref 0.7–1.3)
GLOBULIN, TOTAL: 3 g/dL (ref 2.2–3.9)
Gamma Globulin: 1.5 g/dL (ref 0.4–1.8)
TOTAL PROTEIN ELP: 6.9 g/dL (ref 6.0–8.5)

## 2018-04-14 ENCOUNTER — Other Ambulatory Visit: Payer: Self-pay | Admitting: *Deleted

## 2018-04-14 DIAGNOSIS — C9000 Multiple myeloma not having achieved remission: Secondary | ICD-10-CM

## 2018-04-14 MED ORDER — LENALIDOMIDE 10 MG PO CAPS: ORAL_CAPSULE | ORAL | 0 refills | Status: DC

## 2018-04-29 MED FILL — AMLODIPINE BESYLATE 5 MG TA: 5 | 30 days supply | Qty: 30 | Fill #5

## 2018-05-11 ENCOUNTER — Other Ambulatory Visit: Payer: Self-pay | Admitting: *Deleted

## 2018-05-11 DIAGNOSIS — C9000 Multiple myeloma not having achieved remission: Secondary | ICD-10-CM

## 2018-05-11 MED ORDER — LENALIDOMIDE 10 MG PO CAPS: ORAL_CAPSULE | ORAL | 0 refills | Status: DC

## 2018-05-27 ENCOUNTER — Ambulatory Visit: Payer: 59 | Admitting: Family

## 2018-05-27 ENCOUNTER — Encounter: Payer: Self-pay | Admitting: Family

## 2018-05-27 ENCOUNTER — Ambulatory Visit (INDEPENDENT_AMBULATORY_CARE_PROVIDER_SITE_OTHER): Payer: 59 | Admitting: Family

## 2018-05-27 ENCOUNTER — Telehealth: Payer: Self-pay | Admitting: Family

## 2018-05-27 VITALS — BP 123/85 | HR 65 | Temp 97.9°F | Resp 16 | Ht 71.0 in | Wt 207.6 lb

## 2018-05-27 DIAGNOSIS — C9000 Multiple myeloma not having achieved remission: Secondary | ICD-10-CM | POA: Diagnosis not present

## 2018-05-27 DIAGNOSIS — R351 Nocturia: Secondary | ICD-10-CM | POA: Diagnosis not present

## 2018-05-27 DIAGNOSIS — N521 Erectile dysfunction due to diseases classified elsewhere: Secondary | ICD-10-CM

## 2018-05-27 MED ORDER — AMLODIPINE BESYLATE 5 MG PO TABS: 5.0000 mg | ORAL_TABLET | Freq: Every day | ORAL | 1 refills | Status: DC

## 2018-05-27 MED ORDER — SILDENAFIL CITRATE 100 MG PO TABS: ORAL_TABLET | ORAL | 1 refills | Status: DC

## 2018-05-27 MED FILL — AMLODIPINE BESYLATE 5 MG TA: 5 | 30 days supply | Qty: 30 | Fill #0

## 2018-05-27 MED FILL — SILDENAFIL CITRATE 100 MG T: 100 | 14 days supply | Qty: 4 | Fill #0

## 2018-05-27 NOTE — Progress Notes (Signed)
Subjective:    Patient ID: Jimmy Day, male    DOB: 1960-12-20, 57 y.o.   MRN: [id number]  HPI  Patient is a 57 yr old male who presents today for follow up.  HTN- continues amlodipine BP Readings from Last 3 Encounters:  05/27/18 123/85  04/08/18 123/71  01/20/18 123/87   Urinary frequency-  Reports that he gets up twice in the middle of the night.  Lab Results  Component Value Date   PSA 4.47 (H) 11/26/2017   PSA 0.97 04/08/2009   Reports that he had a negative biopsy of prostate last winter.    Review of Systems See HPI  Past Medical History:  Diagnosis Date  . Elevated PSA 11/29/2017  . Fasting hyperglycemia    NORMAL A1c  . Hyperlipidemia   . Hypertension   . Kappa light chain myeloma ([place]) 02/02/2014  . Testosterone deficiency    Dr Hartley Barefoot     Social History   Socioeconomic History  . Marital status: Married    Spouse name: Not on file  . Number of children: 3  . Years of education: Not on file  . Highest education level: Not on file  Occupational History  . Occupation: Truck Education administrator: Scientist, physiological  Social Needs  . Financial resource strain: Not on file  . Food insecurity:    Worry: Not on file    Inability: Not on file  . Transportation needs:    Medical: Not on file    Non-medical: Not on file  Tobacco Use  . Smoking status: Never Smoker  . Smokeless tobacco: Never Used  . Tobacco comment: never used tobacco  Substance and Sexual Activity  . Alcohol use: Yes    Alcohol/week: 0.0 oz    Comment:  rarely  . Drug use: No  . Sexual activity: Yes    Birth control/protection: Condom  Lifestyle  . Physical activity:    Days per week: Not on file    Minutes per session: Not on file  . Stress: Not on file  Relationships  . Social connections:    Talks on phone: Not on file    Gets together: Not on file    Attends religious service: Not on file    Active member of club or organization: Not on file    Attends meetings of  clubs or organizations: Not on file    Relationship status: Not on file  . Intimate partner violence:    Fear of current or ex partner: Not on file    Emotionally abused: Not on file    Physically abused: Not on file    Forced sexual activity: Not on file  Other Topics Concern  . 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 CERVICAL PLATE INSERTION     POST COMPRESSION DISC INJURY  . LIPOMAS REMOVAL     BENIGN FROM CHEST AND BACK    Family History  Problem Relation Age of Onset  . Breast cancer Mother   . Heart attack Father 53  . Kidney disease Brother        RENAL FAILURE  . Hypertension Brother   . Heart attack Maternal Aunt 65  CABG  . Stroke Maternal Uncle        CVA  . Diabetes Neg Hx   . Colon cancer Neg Hx   . Esophageal cancer Neg Hx   . Rectal cancer Neg Hx   . Stomach cancer Neg Hx   . Colon polyps Neg Hx     No Known Allergies  Current Outpatient Medications on File Prior to Visit  Medication Sig Dispense Refill  . aspirin 325 MG EC tablet Take 325 mg by mouth daily.    . Cholecalciferol (VITAMIN D3) 3000 units TABS Take 1 tablet by mouth daily as needed. 30 tablet   . Lactulose 20 GM/30ML SOLN Take 30 mLs (20 g total) by mouth every 4 (four) hours as needed (Take as directed until bowel movement.). 236 mL 2  . lenalidomide (REVLIMID) 10 MG capsule Daily for 21 days then 7 days off. [id number] 21 capsule 0  . omega-3 acid ethyl esters (LOVAZA) 1 g capsule Take 2 capsules (2 g total) by mouth 2 (two) times daily. 60 capsule 5  . VIAGRA 100 MG tablet TAKE 1 TABLET BY MOUTH DAILY AS NEEDED FOR ERECTILE DYSFUNCTION 4 tablet 5   Current Facility-Administered Medications on File Prior to Visit  Medication Dose  Route Frequency Provider Last Rate Last Dose  . 0.9 %  sodium chloride infusion  500 mL Intravenous Once Irene Shipper, MD        BP 123/85   Pulse 65   Temp 97.9 F (36.6 C) (Oral)   Resp 16   Ht 5\' 11"  (1.803 m)   Wt 207 lb 9.6 oz (94.2 kg)   SpO2 98%   BMI 28.95 kg/m       Objective:   Physical Exam  Constitutional: He is oriented to person, place, and time. He appears well-developed and well-nourished. No distress.  HENT:  Head: Normocephalic and atraumatic.  Cardiovascular: Normal rate and regular rhythm.  No murmur heard. Pulmonary/Chest: Effort normal and breath sounds normal. No respiratory distress. He has no wheezes. He has no rales.  Musculoskeletal: He exhibits no edema.  Neurological: He is alert and oriented to person, place, and time.  Skin: Skin is warm and dry.  Psychiatric: He has a normal mood and affect. His behavior is normal. Thought content normal.          Assessment & Plan:  Nocturia- suspect related to bph, check UA, advised follow up with urology.  HTN- bp stable, continue amlodipine.

## 2018-05-27 NOTE — Addendum Note (Signed)
Addended by: Wynonia Musty A on: 05/27/2018 09:05 AM   Modules accepted: Orders

## 2018-05-27 NOTE — Patient Instructions (Signed)
Please schedule follow up with Urology.

## 2018-05-27 NOTE — Addendum Note (Signed)
Addended by: [name] on: 05/27/2018 08:49 AM   Modules accepted: Level of Service

## 2018-05-27 NOTE — Telephone Encounter (Signed)
Copied from [place] [id number]. Topic: Quick Communication - See Telephone Encounter >> May 27, 2018  8:59 AM Rosalin Hawking wrote: CRM for notification. See Telephone encounter for: 05/27/18.   Pt was seen today in our office and forgot to mention that needs refill on VIAGRA 100 MG tablet. Pt would like to know since in the building if he can get rx today. Please advise ASAP.

## 2018-05-27 NOTE — Telephone Encounter (Signed)
Refill sent to pharmacy.   

## 2018-05-28 LAB — NO CULTURE INDICATED

## 2018-05-28 LAB — URINALYSIS W MICROSCOPIC + REFLEX CULTURE
BILIRUBIN URINE: NEGATIVE
Bacteria, UA: NONE SEEN /HPF
GLUCOSE, UA: NEGATIVE
Hgb urine dipstick: NEGATIVE
Hyaline Cast: NONE SEEN /LPF
KETONES UR: NEGATIVE
LEUKOCYTE ESTERASE: NEGATIVE
NITRITES URINE, INITIAL: NEGATIVE
PH: 6.5 (ref 5.0–8.0)
Protein, ur: NEGATIVE
RBC / HPF: NONE SEEN /HPF (ref 0–2)
SPECIFIC GRAVITY, URINE: 1.016 (ref 1.001–1.03)
Squamous Epithelial / LPF: NONE SEEN /HPF (ref <=5)
WBC, UA: NONE SEEN /HPF (ref 0–5)

## 2018-06-03 ENCOUNTER — Other Ambulatory Visit: Payer: Self-pay | Admitting: *Deleted

## 2018-06-03 DIAGNOSIS — C9000 Multiple myeloma not having achieved remission: Secondary | ICD-10-CM

## 2018-06-03 MED ORDER — LENALIDOMIDE 10 MG PO CAPS: ORAL_CAPSULE | ORAL | 0 refills | Status: DC

## 2018-06-10 ENCOUNTER — Ambulatory Visit: Payer: 59 | Admitting: Family

## 2018-06-27 ENCOUNTER — Telehealth: Payer: Self-pay | Admitting: Family

## 2018-06-27 MED FILL — AMLODIPINE BESYLATE 5 MG TA: 5 | 30 days supply | Qty: 30 | Fill #1

## 2018-06-27 MED FILL — SILDENAFIL CITRATE 100 MG T: 100 | 14 days supply | Qty: 4 | Fill #1

## 2018-06-27 NOTE — Telephone Encounter (Signed)
Pt came back stating to disregard message, pt find out due to insurance, insurance will only cover for 30 days supply and that he understands that it has to do with insurance only permitting 30 day supply.

## 2018-06-27 NOTE — Telephone Encounter (Signed)
Copied from [place] [id number]. Topic: Quick Communication - See Telephone Encounter >> Jun 27, 2018  5:24 PM Rosalin Hawking wrote: CRM for notification. See Telephone encounter for: 06/27/18.   Pt came in office stating came to pharmacy at [facility] to get his rx for  amLODipine (NORVASC) 5 MG tablet, pt states has only gotten 30 day supply. Pt states would like for next refill on up would like for them to be a 60 supply, since it is easier for pt to pick them up in 60 days instead of one every month, if possible to have his next refills for 60 days supply. Please advise.

## 2018-07-07 ENCOUNTER — Other Ambulatory Visit: Payer: Self-pay | Admitting: *Deleted

## 2018-07-07 DIAGNOSIS — C9000 Multiple myeloma not having achieved remission: Secondary | ICD-10-CM

## 2018-07-07 MED ORDER — LENALIDOMIDE 10 MG PO CAPS: ORAL_CAPSULE | ORAL | 0 refills | Status: DC

## 2018-07-08 ENCOUNTER — Inpatient Hospital Stay: Payer: 59

## 2018-07-08 ENCOUNTER — Other Ambulatory Visit: Payer: 59

## 2018-07-20 MED FILL — AMLODIPINE BESYLATE 5 MG TA: 5 | 30 days supply | Qty: 30 | Fill #2

## 2018-07-22 ENCOUNTER — Other Ambulatory Visit: Payer: 59

## 2018-07-22 ENCOUNTER — Inpatient Hospital Stay: Payer: 59

## 2018-08-05 ENCOUNTER — Other Ambulatory Visit: Payer: Self-pay | Admitting: *Deleted

## 2018-08-05 DIAGNOSIS — C9000 Multiple myeloma not having achieved remission: Secondary | ICD-10-CM

## 2018-08-05 MED ORDER — LENALIDOMIDE 10 MG PO CAPS: ORAL_CAPSULE | ORAL | 0 refills | Status: DC

## 2018-08-08 ENCOUNTER — Other Ambulatory Visit: Payer: Self-pay

## 2018-08-08 ENCOUNTER — Inpatient Hospital Stay: Payer: 59

## 2018-08-08 ENCOUNTER — Inpatient Hospital Stay: Payer: 59 | Attending: Hematology & Oncology

## 2018-08-08 VITALS — BP 125/77 | HR 64 | Temp 97.7°F | Resp 18

## 2018-08-08 DIAGNOSIS — Z9484 Stem cells transplant status: Secondary | ICD-10-CM | POA: Insufficient documentation

## 2018-08-08 DIAGNOSIS — C9000 Multiple myeloma not having achieved remission: Secondary | ICD-10-CM | POA: Diagnosis present

## 2018-08-08 LAB — CBC WITH DIFFERENTIAL (CANCER CENTER ONLY)
BASOS PCT: 0 %
Basophils Absolute: 0 10*3/uL (ref 0.0–0.1)
EOS ABS: 0.1 10*3/uL (ref 0.0–0.5)
Eosinophils Relative: 2 %
HEMATOCRIT: 40.7 % (ref 38.7–49.9)
Hemoglobin: 14.1 g/dL (ref 13.0–17.1)
Lymphocytes Relative: 29 %
Lymphs Abs: 1.3 10*3/uL (ref 0.9–3.3)
MCH: 32.3 pg (ref 28.0–33.4)
MCHC: 34.6 g/dL (ref 32.0–35.9)
MCV: 93.1 fL (ref 82.0–98.0)
MONO ABS: 0.4 10*3/uL (ref 0.1–0.9)
MONOS PCT: 9 %
Neutro Abs: 2.7 10*3/uL (ref 1.5–6.5)
Neutrophils Relative %: 60 %
Platelet Count: 178 10*3/uL (ref 145–400)
RBC: 4.37 MIL/uL (ref 4.20–5.70)
RDW: 13.3 % (ref 11.1–15.7)
WBC Count: 4.5 10*3/uL (ref 4.0–10.0)

## 2018-08-08 LAB — CMP (CANCER CENTER ONLY)
ALT: 25 U/L (ref 10–47)
ANION GAP: 0 — AB (ref 5–15)
AST: 24 U/L (ref 11–38)
Albumin: 3.8 g/dL (ref 3.5–5.0)
Alkaline Phosphatase: 43 U/L (ref 26–84)
BILIRUBIN TOTAL: 0.9 mg/dL (ref 0.2–1.6)
BUN: 9 mg/dL (ref 7–22)
CO2: 30 mmol/L (ref 18–33)
CREATININE: 1.4 mg/dL — AB (ref 0.60–1.20)
Calcium: 9 mg/dL (ref 8.0–10.3)
Chloride: 106 mmol/L (ref 98–108)
Glucose, Bld: 99 mg/dL (ref 73–118)
POTASSIUM: 3.6 mmol/L (ref 3.3–4.7)
Sodium: 136 mmol/L (ref 128–145)
TOTAL PROTEIN: 7 g/dL (ref 6.4–8.1)

## 2018-08-08 MED ORDER — DENOSUMAB 120 MG/1.7ML ~~LOC~~ SOLN
SUBCUTANEOUS | Status: AC
  Filled 2018-08-08: qty 1.7

## 2018-08-08 MED ORDER — DENOSUMAB 120 MG/1.7ML ~~LOC~~ SOLN
120.0000 mg | Freq: Once | SUBCUTANEOUS | Status: AC
  Administered 2018-08-08: 120 mg via SUBCUTANEOUS

## 2018-08-09 LAB — IGG, IGA, IGM
IGG (IMMUNOGLOBIN G), SERUM: 1558 mg/dL (ref 700–1600)
IgA: 18 mg/dL — ABNORMAL LOW (ref 90–386)
IgM (Immunoglobulin M), Srm: 17 mg/dL — ABNORMAL LOW (ref 20–172)

## 2018-08-09 LAB — KAPPA/LAMBDA LIGHT CHAINS
Kappa free light chain: 23.1 mg/L — ABNORMAL HIGH (ref 3.3–19.4)
Kappa, lambda light chain ratio: 1.67 — ABNORMAL HIGH (ref 0.26–1.65)
LAMDA FREE LIGHT CHAINS: 13.8 mg/L (ref 5.7–26.3)

## 2018-08-09 LAB — PROTEIN ELECTROPHORESIS, SERUM
A/G RATIO SPE: 1.4 (ref 0.7–1.7)
ALBUMIN ELP: 3.9 g/dL (ref 2.9–4.4)
ALPHA-1-GLOBULIN: 0.2 g/dL (ref 0.0–0.4)
Alpha-2-Globulin: 0.4 g/dL (ref 0.4–1.0)
Beta Globulin: 0.8 g/dL (ref 0.7–1.3)
GLOBULIN, TOTAL: 2.8 g/dL (ref 2.2–3.9)
Gamma Globulin: 1.4 g/dL (ref 0.4–1.8)
Total Protein ELP: 6.7 g/dL (ref 6.0–8.5)

## 2018-09-01 MED FILL — AMLODIPINE BESYLATE 5 MG TA: 5 | 30 days supply | Qty: 30 | Fill #3

## 2018-09-05 ENCOUNTER — Other Ambulatory Visit: Payer: Self-pay | Admitting: *Deleted

## 2018-09-05 DIAGNOSIS — C9000 Multiple myeloma not having achieved remission: Secondary | ICD-10-CM

## 2018-09-05 MED ORDER — LENALIDOMIDE 10 MG PO CAPS: ORAL_CAPSULE | ORAL | 0 refills | Status: DC

## 2018-09-23 MED FILL — AMLODIPINE BESYLATE 5 MG TA: 5 | 30 days supply | Qty: 30 | Fill #4

## 2018-10-06 ENCOUNTER — Other Ambulatory Visit: Payer: Self-pay | Admitting: *Deleted

## 2018-10-06 DIAGNOSIS — C9000 Multiple myeloma not having achieved remission: Secondary | ICD-10-CM

## 2018-10-07 ENCOUNTER — Inpatient Hospital Stay: Payer: 59

## 2018-10-07 ENCOUNTER — Inpatient Hospital Stay: Payer: 59 | Attending: Hematology & Oncology | Admitting: Hematology & Oncology

## 2018-10-07 ENCOUNTER — Encounter: Payer: Self-pay | Admitting: Hematology & Oncology

## 2018-10-07 ENCOUNTER — Other Ambulatory Visit: Payer: Self-pay

## 2018-10-07 VITALS — BP 134/77 | HR 52 | Temp 97.7°F | Resp 16 | Wt 208.0 lb

## 2018-10-07 DIAGNOSIS — C9000 Multiple myeloma not having achieved remission: Secondary | ICD-10-CM

## 2018-10-07 DIAGNOSIS — Z9484 Stem cells transplant status: Secondary | ICD-10-CM

## 2018-10-07 DIAGNOSIS — Z79899 Other long term (current) drug therapy: Secondary | ICD-10-CM | POA: Diagnosis not present

## 2018-10-07 DIAGNOSIS — Z7982 Long term (current) use of aspirin: Secondary | ICD-10-CM | POA: Diagnosis not present

## 2018-10-07 LAB — CBC WITH DIFFERENTIAL (CANCER CENTER ONLY)
ABS IMMATURE GRANULOCYTES: 0.01 10*3/uL (ref 0.00–0.07)
Basophils Absolute: 0.1 10*3/uL (ref 0.0–0.1)
Basophils Relative: 1 %
Eosinophils Absolute: 0.2 10*3/uL (ref 0.0–0.5)
Eosinophils Relative: 6 %
HCT: 43.4 % (ref 39.0–52.0)
HEMOGLOBIN: 14.8 g/dL (ref 13.0–17.0)
IMMATURE GRANULOCYTES: 0 %
LYMPHS PCT: 34 %
Lymphs Abs: 1.4 10*3/uL (ref 0.7–4.0)
MCH: 31.8 pg (ref 26.0–34.0)
MCHC: 34.1 g/dL (ref 30.0–36.0)
MCV: 93.3 fL (ref 80.0–100.0)
MONOS PCT: 12 %
Monocytes Absolute: 0.5 10*3/uL (ref 0.1–1.0)
NEUTROS ABS: 1.8 10*3/uL (ref 1.7–7.7)
NEUTROS PCT: 47 %
Platelet Count: 187 10*3/uL (ref 150–400)
RBC: 4.65 MIL/uL (ref 4.22–5.81)
RDW: 13.2 % (ref 11.5–15.5)
WBC: 4 10*3/uL (ref 4.0–10.5)
nRBC: 0 % (ref 0.0–0.2)

## 2018-10-07 LAB — CMP (CANCER CENTER ONLY)
ALBUMIN: 3.9 g/dL (ref 3.5–5.0)
ALK PHOS: 39 U/L (ref 26–84)
ALT: 34 U/L (ref 10–47)
AST: 27 U/L (ref 11–38)
Anion gap: 5 (ref 5–15)
BILIRUBIN TOTAL: 1.2 mg/dL (ref 0.2–1.6)
BUN: 13 mg/dL (ref 7–22)
CHLORIDE: 107 mmol/L (ref 98–108)
CO2: 29 mmol/L (ref 18–33)
Calcium: 9.4 mg/dL (ref 8.0–10.3)
Creatinine: 1.2 mg/dL (ref 0.60–1.20)
Glucose, Bld: 101 mg/dL (ref 73–118)
Potassium: 4 mmol/L (ref 3.3–4.7)
SODIUM: 141 mmol/L (ref 128–145)
Total Protein: 7.1 g/dL (ref 6.4–8.1)

## 2018-10-07 MED ORDER — DENOSUMAB 120 MG/1.7ML ~~LOC~~ SOLN
120.0000 mg | Freq: Once | SUBCUTANEOUS | Status: AC
  Administered 2018-10-07: 120 mg via SUBCUTANEOUS

## 2018-10-07 MED ORDER — DENOSUMAB 120 MG/1.7ML ~~LOC~~ SOLN
SUBCUTANEOUS | Status: AC
  Filled 2018-10-07: qty 1.7

## 2018-10-07 MED FILL — SILDENAFIL CITRATE 100 MG T: 100 | 14 days supply | Qty: 4 | Fill #2

## 2018-10-07 NOTE — Progress Notes (Signed)
Hematology and Oncology Follow Up Visit  Donyel Nester [facility] [phone] 03-06-1961 57 y.o. 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 Baumgardner is here today for follow-up. He is doing well and has no complaints at this time. He is doing well on Revlimid and verbalized that he is taking as prescribed.   In September, his myeloma studies showed no monoclonal spike in his blood.  His IgG G level was 1560 mg/dL.  His Kappa light chain was 2.3 mg/dL.  He is still working full-time.  He is having no problems with this.  There has been no issues with bowels or bladder.  He did have a prior prostate biopsy that was negative for any malignancy.  He is still works out daily.  He is an incredibly good shape.  He has had a good appetite.  He has had no bleeding.  He is had no cough or shortness of breath.  Currently, his performance status is ECOG 0.     Medications:  Allergies as of 10/07/2018   No Known Allergies     Medication List        Accurate as of 10/07/18  8:54 AM. Always use your most recent med list.          amLODipine 5 MG tablet Commonly known as:  NORVASC Take 1 tablet (5 mg total) by mouth daily.   aspirin 325 MG EC tablet Take 325 mg by mouth daily.   Lactulose 20 GM/30ML Soln Take 30 mLs (20 g total) by mouth every 4 (four) hours as needed (Take as directed until bowel movement.).   lenalidomide 10 MG capsule Commonly known as:  REVLIMID Daily for 21 days then 7 days off. [id number]   omega-3 acid ethyl esters 1 g capsule Commonly known as:  LOVAZA Take 2 capsules (2 g total) by mouth 2 (two) times daily.   sildenafil 100 MG tablet Commonly known as:  VIAGRA TAKE 1 TABLET BY MOUTH DAILY AS NEEDED FOR ERECTILE DYSFUNCTION   Vitamin D3 75 MCG (3000 UT)  Tabs Take 1 tablet by mouth daily as needed.       Allergies: No Known Allergies  Past Medical History, Surgical history, Social history, and Family History were reviewed and updated.  Review of Systems: Review of Systems  Constitutional: Negative.   HENT: Negative.   Eyes: Negative.   Respiratory: Negative.   Cardiovascular: Negative.   Gastrointestinal: Negative.   Genitourinary: Negative.   Musculoskeletal: Negative.   Skin: Negative.   Neurological: Negative.   Endo/Heme/Allergies: Negative.   Psychiatric/Behavioral: Negative.      Physical Exam:  weight is 208 lb (94.3 kg). His oral temperature is 97.7 F (36.5 C). His blood pressure is 134/77 and his pulse is 52 (abnormal). His respiration is 16 and oxygen saturation is 100%.   Wt Readings from Last 3 Encounters:  10/07/18 208 lb (94.3 kg)  05/27/18 207 lb 9.6 oz (94.2 kg)  04/08/18 208 lb (94.3 kg)    Physical Exam  Constitutional: He is oriented to person, place, and time.  HENT:  Head: Normocephalic and atraumatic.  Mouth/Throat: Oropharynx is clear and moist.  Eyes: Pupils are equal, round, and reactive to light. EOM are normal.  Neck: Normal range of motion.  Cardiovascular: Normal rate, regular rhythm  and normal heart sounds.  Pulmonary/Chest: Effort normal and breath sounds normal.  Abdominal: Soft. Bowel sounds are normal.  Musculoskeletal: Normal range of motion. He exhibits no edema, tenderness or deformity.  Lymphadenopathy:    He has no cervical adenopathy.  Neurological: He is alert and oriented to person, place, and time.  Skin: Skin is warm and dry. No rash noted. No erythema.  Psychiatric: He has a normal mood and affect. His behavior is normal. Judgment and thought content normal.  Vitals reviewed.     Lab Results  Component Value Date   WBC 4.0 10/07/2018   HGB 14.8 10/07/2018   HCT 43.4 10/07/2018   MCV 93.3 10/07/2018   PLT 187 10/07/2018   Lab Results  Component Value Date    FERRITIN 152 08/17/2014   IRON 106 08/17/2014   TIBC 253 08/17/2014   UIBC 146 08/17/2014   IRONPCTSAT 42 08/17/2014   Lab Results  Component Value Date   RBC 4.65 10/07/2018   Lab Results  Component Value Date   KPAFRELGTCHN 23.1 (H) 08/08/2018   LAMBDASER 13.8 08/08/2018   KAPLAMBRATIO 1.67 (H) 08/08/2018   Lab Results  Component Value Date   IGGSERUM 1,558 08/08/2018   IGA 18 (L) 08/08/2018   IGMSERUM 17 (L) 08/08/2018   Lab Results  Component Value Date   TOTALPROTELP 6.7 08/08/2018   ALBUMINELP 3.9 08/08/2018   A1GS 0.2 08/08/2018   A2GS 0.4 08/08/2018   BETS 0.8 08/08/2018   BETA2SER 0.2 10/04/2015   GAMS 1.4 08/08/2018   MSPIKE Not Observed 08/08/2018   SPEI Comment 08/08/2018     Chemistry      Component Value Date/Time   NA 136 08/08/2018 1337   NA 139 08/13/2017 0918   NA 142 04/01/2015 0834   K 3.6 08/08/2018 1337   K 3.6 08/13/2017 0918   K 3.4 (L) 04/01/2015 0834   CL 106 08/08/2018 1337   CL 105 08/13/2017 0918   CO2 30 08/08/2018 1337   CO2 28 08/13/2017 0918   CO2 20 (L) 04/01/2015 0834   BUN 9 08/08/2018 1337   BUN 8 08/13/2017 0918   BUN 10.4 04/01/2015 0834   CREATININE 1.40 (H) 08/08/2018 1337   CREATININE 1.4 (H) 08/13/2017 0918   CREATININE 1.2 04/01/2015 0834      Component Value Date/Time   CALCIUM 9.0 08/08/2018 1337   CALCIUM 9.1 08/13/2017 0918   CALCIUM 8.6 04/01/2015 0834   ALKPHOS 43 08/08/2018 1337   ALKPHOS 42 08/13/2017 0918   ALKPHOS 39 (L) 04/01/2015 0834   AST 24 08/08/2018 1337   AST 17 04/01/2015 0834   ALT 25 08/08/2018 1337   ALT 21 08/13/2017 0918   ALT 19 04/01/2015 0834   BILITOT 0.9 08/08/2018 1337   BILITOT 0.56 04/01/2015 0834      Impression and Plan: Mr. Colt is a pleasant 57 yo African American gentleman with IgG kappa myeloma.  He had induction therapy with RVD.  He then underwent s/p autologous stem cell transplant at [facility] in April 2016. He is doing well and is asymptomatic at this time.    His myeloma studies have remained stable. Results for today are pending.  He will continue his same regimen with Revlimid.   He received his Delton See today.   I will plan to see him back in 6 months.  He will get his Xgeva in 3 months.  [name], MD 11/15/20198:54 AM

## 2018-10-07 NOTE — Progress Notes (Signed)
OK to give Xgeva early per Dr Marin Olp. dph

## 2018-10-07 NOTE — Patient Instructions (Signed)

## 2018-10-10 ENCOUNTER — Other Ambulatory Visit: Payer: Self-pay | Admitting: *Deleted

## 2018-10-10 DIAGNOSIS — C9000 Multiple myeloma not having achieved remission: Secondary | ICD-10-CM

## 2018-10-10 MED ORDER — LENALIDOMIDE 10 MG PO CAPS: ORAL_CAPSULE | ORAL | 0 refills | Status: DC

## 2018-10-28 MED FILL — OMEGA-3 ETHYL ESTER 1 GM CA: 1 | 15 days supply | Qty: 60 | Fill #4

## 2018-10-28 MED FILL — AMLODIPINE BESYLATE 5 MG TA: 5 | 30 days supply | Qty: 30 | Fill #5

## 2018-10-28 MED FILL — SILDENAFIL CITRATE 100 MG T: 100 | 14 days supply | Qty: 4 | Fill #3

## 2018-10-31 ENCOUNTER — Other Ambulatory Visit: Payer: 59

## 2018-10-31 ENCOUNTER — Inpatient Hospital Stay: Payer: 59

## 2018-11-23 DIAGNOSIS — Z8616 Personal history of COVID-19: Secondary | ICD-10-CM

## 2018-11-23 HISTORY — DX: Personal history of COVID-19: Z86.16

## 2018-11-25 ENCOUNTER — Encounter: Payer: Self-pay | Admitting: Family

## 2018-11-25 ENCOUNTER — Other Ambulatory Visit: Payer: Self-pay | Admitting: Family

## 2018-11-25 ENCOUNTER — Ambulatory Visit: Payer: 59 | Admitting: Family

## 2018-11-25 ENCOUNTER — Other Ambulatory Visit: Payer: Self-pay | Admitting: *Deleted

## 2018-11-25 ENCOUNTER — Telehealth: Payer: Self-pay | Admitting: Family

## 2018-11-25 VITALS — BP 126/80 | HR 54 | Temp 98.3°F | Resp 16 | Ht 71.0 in | Wt 204.0 lb

## 2018-11-25 DIAGNOSIS — E559 Vitamin D deficiency, unspecified: Secondary | ICD-10-CM

## 2018-11-25 DIAGNOSIS — C9000 Multiple myeloma not having achieved remission: Secondary | ICD-10-CM

## 2018-11-25 DIAGNOSIS — Z23 Encounter for immunization: Secondary | ICD-10-CM

## 2018-11-25 DIAGNOSIS — I10 Essential (primary) hypertension: Secondary | ICD-10-CM | POA: Diagnosis not present

## 2018-11-25 DIAGNOSIS — E538 Deficiency of other specified B group vitamins: Secondary | ICD-10-CM

## 2018-11-25 DIAGNOSIS — E781 Pure hyperglyceridemia: Secondary | ICD-10-CM

## 2018-11-25 LAB — LIPID PANEL
CHOL/HDL RATIO: 5
Cholesterol: 162 mg/dL (ref 0–200)
HDL: 35.8 mg/dL — AB (ref >39.00)
LDL Cholesterol: 101 mg/dL — ABNORMAL HIGH (ref 0–99)
NONHDL: 125.79
TRIGLYCERIDES: 122 mg/dL (ref 0.0–149.0)
VLDL: 24.4 mg/dL (ref 0.0–40.0)

## 2018-11-25 LAB — VITAMIN B12: Vitamin B-12: 122 pg/mL — ABNORMAL LOW (ref 211–911)

## 2018-11-25 MED ORDER — AMLODIPINE BESYLATE 5 MG PO TABS: 5.0000 mg | ORAL_TABLET | Freq: Every day | ORAL | 1 refills | Status: DC

## 2018-11-25 MED ORDER — LENALIDOMIDE 10 MG PO CAPS: ORAL_CAPSULE | ORAL | 0 refills | Status: DC

## 2018-11-25 MED ORDER — TADALAFIL 20 MG PO TABS: 20.0000 mg | ORAL_TABLET | Freq: Every day | ORAL | 11 refills | Status: DC | PRN

## 2018-11-25 MED FILL — AMLODIPINE BESYLATE 5 MG TA: 5 | 90 days supply | Qty: 90 | Fill #0

## 2018-11-25 MED FILL — SILDENAFIL CITRATE 100 MG T: 100 | 14 days supply | Qty: 4 | Fill #4

## 2018-11-25 NOTE — Telephone Encounter (Signed)
b12 level is low again. I recommend that he begin b12 1063mcg IM once weekly for 4 weeks, then once monthly.

## 2018-11-25 NOTE — Progress Notes (Signed)
Subjective:    Patient ID: Jimmy Day, male    DOB: June 24, 1961, 58 y.o.   MRN: [id number]  HPI  Patient is a 58 yr old male who presents today for follow up.  HTN- maintained on amlodipine.  BP Readings from Last 3 Encounters:  11/25/18 126/80  10/07/18 134/77  08/08/18 125/77   Kappa light Chain Myeloma- Patient continues to follow with oncology. (Dr. Marin Olp).  He is maintained on Revlimid/Xgeva and has been stable. He is following Q6 months with oncology at this point.    Hypertriglyceridemia- maintained on lovaza.  Lab Results  Component Value Date   CHOL 159 11/26/2017   HDL 32.50 (L) 11/26/2017   LDLCALC 83 08/13/2017   LDLDIRECT 93.0 11/26/2017   TRIG 207.0 (H) 11/26/2017   CHOLHDL 5 11/26/2017    Review of Systems See HPI  Past Medical History:  Diagnosis Date  . Elevated PSA 11/29/2017  . Fasting hyperglycemia    NORMAL A1c  . Hyperlipidemia   . Hypertension   . Kappa light chain myeloma ([place]) 02/02/2014  . Testosterone deficiency    Dr Hartley Barefoot     Social History   Socioeconomic History  . Marital status: Married    Spouse name: Not on file  . Number of children: 3  . Years of education: Not on file  . Highest education level: Not on file  Occupational History  . Occupation: Truck Education administrator: Scientist, physiological  Social Needs  . Financial resource strain: Not on file  . Food insecurity:    Worry: Not on file    Inability: Not on file  . Transportation needs:    Medical: Not on file    Non-medical: Not on file  Tobacco Use  . Smoking status: Never Smoker  . Smokeless tobacco: Never Used  . Tobacco comment: never used tobacco  Substance and Sexual Activity  . Alcohol use: Yes    Alcohol/week: 0.0 standard drinks    Comment:  rarely  . Drug use: No  . Sexual activity: Yes    Birth control/protection: Condom  Lifestyle  . Physical activity:    Days per week: Not on file    Minutes per session: Not on file  . Stress: Not on file    Relationships  . Social connections:    Talks on phone: Not on file    Gets together: Not on file    Attends religious service: Not on file    Active member of club or organization: Not on file    Attends meetings of clubs or organizations: Not on file    Relationship status: Not on file  . Intimate partner violence:    Fear of current or ex partner: Not on file    Emotionally abused: Not on file    Physically abused: Not on file    Forced sexual activity: Not on file  Other Topics Concern  . 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 CERVICAL PLATE INSERTION     POST COMPRESSION DISC INJURY  . LIPOMAS REMOVAL     BENIGN FROM CHEST AND BACK    Family History  Problem Relation Age of Onset  .  Breast cancer Mother   . Heart attack Father 7  . Kidney disease Brother        RENAL FAILURE  . Hypertension Brother   . Heart attack Maternal Aunt 65       CABG  . Stroke Maternal Uncle        CVA  . Diabetes Neg Hx   . Colon cancer Neg Hx   . Esophageal cancer Neg Hx   . Rectal cancer Neg Hx   . Stomach cancer Neg Hx   . Colon polyps Neg Hx     No Known Allergies  Current Outpatient Medications on File Prior to Visit  Medication Sig Dispense Refill  . amLODipine (NORVASC) 5 MG tablet Take 1 tablet (5 mg total) by mouth daily. 90 tablet 1  . aspirin 325 MG EC tablet Take 325 mg by mouth daily.    . Cholecalciferol (VITAMIN D3) 3000 units TABS Take 1 tablet by mouth daily as needed. 30 tablet   . Lactulose 20 GM/30ML SOLN Take 30 mLs (20 g total) by mouth every 4 (four) hours as needed (Take as directed until bowel movement.). 236 mL 2  . lenalidomide (REVLIMID) 10 MG capsule Daily for 21 days then 7 days off. [id number] 21  capsule 0  . omega-3 acid ethyl esters (LOVAZA) 1 g capsule Take 2 capsules (2 g total) by mouth 2 (two) times daily. 60 capsule 5  . sildenafil (VIAGRA) 100 MG tablet TAKE 1 TABLET BY MOUTH DAILY AS NEEDED FOR ERECTILE DYSFUNCTION 12 tablet 1   Current Facility-Administered Medications on File Prior to Visit  Medication Dose Route Frequency Provider Last Rate Last Dose  . 0.9 %  sodium chloride infusion  500 mL Intravenous Once Irene Shipper, MD        BP 126/80 (BP Location: Left Arm, Patient Position: Sitting, Cuff Size: Large)   Pulse (!) 54   Temp 98.3 F (36.8 C) (Oral)   Resp 16   Ht 5\' 11"  (1.803 m)   Wt 204 lb (92.5 kg)   SpO2 99%   BMI 28.45 kg/m       Objective:   Physical Exam Constitutional:      General: He is not in acute distress.    Appearance: He is well-developed.  HENT:     Head: Normocephalic and atraumatic.  Cardiovascular:     Rate and Rhythm: Normal rate and regular rhythm.     Heart sounds: No murmur.  Pulmonary:     Effort: Pulmonary effort is normal. No respiratory distress.     Breath sounds: Normal breath sounds. No wheezing or rales.  Skin:    General: Skin is warm and dry.  Neurological:     Mental Status: He is alert and oriented to person, place, and time.  Psychiatric:        Behavior: Behavior normal.        Thought Content: Thought content normal.           Assessment & Plan:  HTN- bp stable on amlodipine, continue same.  Vit D deficiency- continues vit D. Check level.  Myeloma- clinically stable, management per hematology.  Hx of b12 deficiency- check follow up level.  ED- requests rx for cialis instead of viagra. rx sent.  Hypertriglyceridemia- check follow up lipid panel.

## 2018-11-25 NOTE — Patient Instructions (Signed)
Please complete lab work prior to leaving.   

## 2018-11-28 ENCOUNTER — Telehealth: Payer: Self-pay | Admitting: Family

## 2018-11-28 LAB — VITAMIN D 1,25 DIHYDROXY
VITAMIN D3 1, 25 (OH): 80 pg/mL
Vitamin D 1, 25 (OH)2 Total: 80 pg/mL — ABNORMAL HIGH (ref 18–72)

## 2018-11-28 MED ORDER — VITAMIN D 25 MCG (1000 UNIT) PO TABS: 1000.0000 [IU] | ORAL_TABLET | Freq: Every day | ORAL | Status: DC

## 2018-11-28 NOTE — Telephone Encounter (Signed)
Vit d level is too high. Decrease vit D from 3000 iu once daily to 1000 iu once daily.

## 2018-11-28 NOTE — Telephone Encounter (Signed)
Patient scheduled to be here Friday for B12 injection one for four weekly, then will need to continue once a month.

## 2018-11-28 NOTE — Telephone Encounter (Signed)
Patient advised to decrease dosage to 1000 iu a day.

## 2018-12-02 ENCOUNTER — Ambulatory Visit: Payer: 59

## 2018-12-23 ENCOUNTER — Other Ambulatory Visit: Payer: Self-pay | Admitting: Family

## 2018-12-23 MED FILL — SILDENAFIL CITRATE 100 MG T: 100 | 14 days supply | Qty: 4 | Fill #5

## 2018-12-23 MED FILL — OMEGA-3 ETHYL ESTER 1 GM CA: 1 | 2 days supply | Qty: 10 | Fill #0

## 2019-01-03 ENCOUNTER — Other Ambulatory Visit: Payer: Self-pay | Admitting: *Deleted

## 2019-01-03 DIAGNOSIS — C9000 Multiple myeloma not having achieved remission: Secondary | ICD-10-CM

## 2019-01-03 MED ORDER — LENALIDOMIDE 10 MG PO CAPS: ORAL_CAPSULE | ORAL | 0 refills | Status: DC

## 2019-01-04 ENCOUNTER — Other Ambulatory Visit: Payer: Self-pay | Admitting: *Deleted

## 2019-01-04 DIAGNOSIS — C9000 Multiple myeloma not having achieved remission: Secondary | ICD-10-CM

## 2019-01-05 ENCOUNTER — Inpatient Hospital Stay: Payer: 59 | Attending: Hematology & Oncology

## 2019-01-05 ENCOUNTER — Other Ambulatory Visit: Payer: Self-pay | Admitting: Family

## 2019-01-05 ENCOUNTER — Other Ambulatory Visit: Payer: Self-pay

## 2019-01-05 ENCOUNTER — Inpatient Hospital Stay: Payer: 59

## 2019-01-05 VITALS — BP 121/84 | HR 60 | Temp 97.6°F

## 2019-01-05 DIAGNOSIS — C9000 Multiple myeloma not having achieved remission: Secondary | ICD-10-CM

## 2019-01-05 DIAGNOSIS — N521 Erectile dysfunction due to diseases classified elsewhere: Secondary | ICD-10-CM

## 2019-01-05 LAB — CMP (CANCER CENTER ONLY)
ALT: 20 U/L (ref 0–44)
AST: 16 U/L (ref 15–41)
Albumin: 4.5 g/dL (ref 3.5–5.0)
Alkaline Phosphatase: 36 U/L — ABNORMAL LOW (ref 38–126)
Anion gap: 6 (ref 5–15)
BUN: 15 mg/dL (ref 6–20)
CO2: 27 mmol/L (ref 22–32)
Calcium: 9.1 mg/dL (ref 8.9–10.3)
Chloride: 105 mmol/L (ref 98–111)
Creatinine: 1.31 mg/dL — ABNORMAL HIGH (ref 0.61–1.24)
GFR, Est AFR Am: >60 mL/min (ref >60)
GFR, Estimated: >60 mL/min (ref >60)
GLUCOSE: 91 mg/dL (ref 70–99)
Potassium: 3.7 mmol/L (ref 3.5–5.1)
Sodium: 138 mmol/L (ref 135–145)
Total Bilirubin: 1 mg/dL (ref 0.3–1.2)
Total Protein: 7 g/dL (ref 6.5–8.1)

## 2019-01-05 LAB — CBC WITH DIFFERENTIAL (CANCER CENTER ONLY)
Abs Immature Granulocytes: 0 10*3/uL (ref 0.00–0.07)
Basophils Absolute: 0.1 10*3/uL (ref 0.0–0.1)
Basophils Relative: 2 %
Eosinophils Absolute: 0.1 10*3/uL (ref 0.0–0.5)
Eosinophils Relative: 4 %
HCT: 42.1 % (ref 39.0–52.0)
Hemoglobin: 14.9 g/dL (ref 13.0–17.0)
Immature Granulocytes: 0 %
Lymphocytes Relative: 42 %
Lymphs Abs: 1.5 10*3/uL (ref 0.7–4.0)
MCH: 32.7 pg (ref 26.0–34.0)
MCHC: 35.4 g/dL (ref 30.0–36.0)
MCV: 92.3 fL (ref 80.0–100.0)
Monocytes Absolute: 0.3 10*3/uL (ref 0.1–1.0)
Monocytes Relative: 9 %
NEUTROS PCT: 43 %
Neutro Abs: 1.6 10*3/uL — ABNORMAL LOW (ref 1.7–7.7)
Platelet Count: 214 10*3/uL (ref 150–400)
RBC: 4.56 MIL/uL (ref 4.22–5.81)
RDW: 13.1 % (ref 11.5–15.5)
WBC Count: 3.7 10*3/uL — ABNORMAL LOW (ref 4.0–10.5)
nRBC: 0 % (ref 0.0–0.2)

## 2019-01-05 MED ORDER — DENOSUMAB 120 MG/1.7ML ~~LOC~~ SOLN
120.0000 mg | Freq: Once | SUBCUTANEOUS | Status: AC
  Administered 2019-01-05: 120 mg via SUBCUTANEOUS

## 2019-01-05 MED ORDER — DENOSUMAB 120 MG/1.7ML ~~LOC~~ SOLN
SUBCUTANEOUS | Status: AC
  Filled 2019-01-05: qty 1.7

## 2019-01-05 NOTE — Patient Instructions (Signed)
Denosumab injection (Xgeva) What is this medicine? DENOSUMAB (den oh sue mab) slows bone breakdown. Prolia is used to treat osteoporosis in women after menopause and in men, and in people who are taking corticosteroids for 6 months or more. Xgeva is used to treat a high calcium level due to cancer and to prevent bone fractures and other bone problems caused by multiple myeloma or cancer bone metastases. Xgeva is also used to treat giant cell tumor of the bone. This medicine may be used for other purposes; ask your health care provider or pharmacist if you have questions. COMMON BRAND NAME(S): Prolia, XGEVA What should I tell my health care provider before I take this medicine? They need to know if you have any of these conditions: -dental disease -having surgery or tooth extraction -infection -kidney disease -low levels of calcium or Vitamin D in the blood -malnutrition -on hemodialysis -skin conditions or sensitivity -thyroid or parathyroid disease -an unusual reaction to denosumab, other medicines, foods, dyes, or preservatives -pregnant or trying to get pregnant -breast-feeding How should I use this medicine? This medicine is for injection under the skin. It is given by a health care professional in a hospital or clinic setting. A special MedGuide will be given to you before each treatment. Be sure to read this information carefully each time. For Prolia, talk to your pediatrician regarding the use of this medicine in children. Special care may be needed. For Xgeva, talk to your pediatrician regarding the use of this medicine in children. While this drug may be prescribed for children as young as 13 years for selected conditions, precautions do apply. Overdosage: If you think you have taken too much of this medicine contact a poison control center or emergency room at once. NOTE: This medicine is only for you. Do not share this medicine with others. What if I miss a dose? It is important  not to miss your dose. Call your doctor or health care professional if you are unable to keep an appointment. What may interact with this medicine? Do not take this medicine with any of the following medications: -other medicines containing denosumab This medicine may also interact with the following medications: -medicines that lower your chance of fighting infection -steroid medicines like prednisone or cortisone This list may not describe all possible interactions. Give your health care provider a list of all the medicines, herbs, non-prescription drugs, or dietary supplements you use. Also tell them if you smoke, drink alcohol, or use illegal drugs. Some items may interact with your medicine. What should I watch for while using this medicine? Visit your doctor or health care professional for regular checks on your progress. Your doctor or health care professional may order blood tests and other tests to see how you are doing. Call your doctor or health care professional for advice if you get a fever, chills or sore throat, or other symptoms of a cold or flu. Do not treat yourself. This drug may decrease your body's ability to fight infection. Try to avoid being around people who are sick. You should make sure you get enough calcium and vitamin D while you are taking this medicine, unless your doctor tells you not to. Discuss the foods you eat and the vitamins you take with your health care professional. See your dentist regularly. Brush and floss your teeth as directed. Before you have any dental work done, tell your dentist you are receiving this medicine. Do not become pregnant while taking this medicine or for 5   months after stopping it. Talk with your doctor or health care professional about your birth control options while taking this medicine. Women should inform their doctor if they wish to become pregnant or think they might be pregnant. There is a potential for serious side effects to an  unborn child. Talk to your health care professional or pharmacist for more information. What side effects may I notice from receiving this medicine? Side effects that you should report to your doctor or health care professional as soon as possible: -allergic reactions like skin rash, itching or hives, swelling of the face, lips, or tongue -bone pain -breathing problems -dizziness -jaw pain, especially after dental work -redness, blistering, peeling of the skin -signs and symptoms of infection like fever or chills; cough; sore throat; pain or trouble passing urine -signs of low calcium like fast heartbeat, muscle cramps or muscle pain; pain, tingling, numbness in the hands or feet; seizures -unusual bleeding or bruising -unusually weak or tired Side effects that usually do not require medical attention (report to your doctor or health care professional if they continue or are bothersome): -constipation -diarrhea -headache -joint pain -loss of appetite -muscle pain -runny nose -tiredness -upset stomach This list may not describe all possible side effects. Call your doctor for medical advice about side effects. You may report side effects to FDA at 1-800-FDA-1088. Where should I keep my medicine? This medicine is only given in a clinic, doctor's office, or other health care setting and will not be stored at home. NOTE: This sheet is a summary. It may not cover all possible information. If you have questions about this medicine, talk to your doctor, pharmacist, or health care provider.  2019 Elsevier/Gold Standard (2018-03-18 16:10:44)

## 2019-01-06 LAB — PROTEIN ELECTROPHORESIS, SERUM
A/G Ratio: 1.3 (ref 0.7–1.7)
ALBUMIN ELP: 3.9 g/dL (ref 2.9–4.4)
Alpha-1-Globulin: 0.2 g/dL (ref 0.0–0.4)
Alpha-2-Globulin: 0.4 g/dL (ref 0.4–1.0)
BETA GLOBULIN: 0.8 g/dL (ref 0.7–1.3)
GAMMA GLOBULIN: 1.5 g/dL (ref 0.4–1.8)
Globulin, Total: 2.9 g/dL (ref 2.2–3.9)
Total Protein ELP: 6.8 g/dL (ref 6.0–8.5)

## 2019-01-06 LAB — IGG, IGA, IGM
IgA: 18 mg/dL — ABNORMAL LOW (ref 90–386)
IgG (Immunoglobin G), Serum: 1596 mg/dL (ref 700–1600)
IgM (Immunoglobulin M), Srm: 18 mg/dL — ABNORMAL LOW (ref 20–172)

## 2019-01-06 LAB — KAPPA/LAMBDA LIGHT CHAINS
Kappa free light chain: 21.1 mg/L — ABNORMAL HIGH (ref 3.3–19.4)
Kappa, lambda light chain ratio: 1.69 — ABNORMAL HIGH (ref 0.26–1.65)
Lambda free light chains: 12.5 mg/L (ref 5.7–26.3)

## 2019-01-09 ENCOUNTER — Telehealth: Payer: Self-pay | Admitting: *Deleted

## 2019-01-09 NOTE — Telephone Encounter (Signed)
-----   Message from [name], MD sent at 01/09/2019  6:46 AM EST ----- Call - NO myeloma in the blood!!!  pete

## 2019-01-09 NOTE — Telephone Encounter (Signed)
As noted below by Dr. Marin Olp, I informed the patient that there is NO Myeloma in the blood. He verbalized understanding.

## 2019-01-27 ENCOUNTER — Other Ambulatory Visit: Payer: Self-pay | Admitting: *Deleted

## 2019-01-27 DIAGNOSIS — C9000 Multiple myeloma not having achieved remission: Secondary | ICD-10-CM

## 2019-01-27 MED ORDER — LENALIDOMIDE 10 MG PO CAPS: ORAL_CAPSULE | ORAL | 0 refills | Status: DC

## 2019-02-01 ENCOUNTER — Other Ambulatory Visit: Payer: Self-pay | Admitting: *Deleted

## 2019-02-01 DIAGNOSIS — C9000 Multiple myeloma not having achieved remission: Secondary | ICD-10-CM

## 2019-02-01 MED ORDER — LENALIDOMIDE 10 MG PO CAPS: ORAL_CAPSULE | ORAL | 0 refills | Status: DC

## 2019-02-10 MED FILL — OMEGA-3 ETHYL ESTER 1 GM CA: 1 | 6 days supply | Qty: 14 | Fill #1

## 2019-02-10 MED FILL — AMLODIPINE BESYLATE 5 MG TA: 5 | 90 days supply | Qty: 90 | Fill #1

## 2019-02-16 ENCOUNTER — Other Ambulatory Visit: Payer: Self-pay

## 2019-02-16 MED ORDER — SILDENAFIL CITRATE 100 MG PO TABS: 50.0000 mg | ORAL_TABLET | Freq: Every day | ORAL | 11 refills | Status: DC | PRN

## 2019-02-24 ENCOUNTER — Encounter: Payer: 59 | Admitting: Family

## 2019-02-24 MED FILL — SILDENAFIL CITRATE 100 MG T: 100 | 17 days supply | Qty: 5 | Fill #0

## 2019-02-24 MED FILL — OMEGA-3 ETHYL ESTER 1 GM CA: 1 | 6 days supply | Qty: 16 | Fill #2

## 2019-03-01 ENCOUNTER — Other Ambulatory Visit: Payer: Self-pay | Admitting: *Deleted

## 2019-03-01 DIAGNOSIS — C9000 Multiple myeloma not having achieved remission: Secondary | ICD-10-CM

## 2019-03-01 MED ORDER — LENALIDOMIDE 10 MG PO CAPS: ORAL_CAPSULE | ORAL | 0 refills | Status: DC

## 2019-04-07 ENCOUNTER — Encounter: Payer: Self-pay | Admitting: Hematology & Oncology

## 2019-04-07 ENCOUNTER — Inpatient Hospital Stay: Payer: 59 | Attending: Hematology & Oncology | Admitting: Hematology & Oncology

## 2019-04-07 ENCOUNTER — Inpatient Hospital Stay: Payer: 59

## 2019-04-07 ENCOUNTER — Other Ambulatory Visit: Payer: Self-pay

## 2019-04-07 VITALS — BP 128/81 | HR 59 | Temp 97.7°F | Resp 18 | Wt 205.0 lb

## 2019-04-07 DIAGNOSIS — Z7982 Long term (current) use of aspirin: Secondary | ICD-10-CM | POA: Insufficient documentation

## 2019-04-07 DIAGNOSIS — C9000 Multiple myeloma not having achieved remission: Secondary | ICD-10-CM | POA: Diagnosis present

## 2019-04-07 DIAGNOSIS — Z79899 Other long term (current) drug therapy: Secondary | ICD-10-CM | POA: Diagnosis not present

## 2019-04-07 DIAGNOSIS — Z9484 Stem cells transplant status: Secondary | ICD-10-CM | POA: Diagnosis not present

## 2019-04-07 LAB — CBC WITH DIFFERENTIAL (CANCER CENTER ONLY)
Abs Immature Granulocytes: 0.01 10*3/uL (ref 0.00–0.07)
Basophils Absolute: 0.1 10*3/uL (ref 0.0–0.1)
Basophils Relative: 3 %
Eosinophils Absolute: 0.1 10*3/uL (ref 0.0–0.5)
Eosinophils Relative: 2 %
HCT: 42.3 % (ref 39.0–52.0)
Hemoglobin: 15.1 g/dL (ref 13.0–17.0)
Immature Granulocytes: 0 %
Lymphocytes Relative: 37 %
Lymphs Abs: 1.7 10*3/uL (ref 0.7–4.0)
MCH: 32.9 pg (ref 26.0–34.0)
MCHC: 35.7 g/dL (ref 30.0–36.0)
MCV: 92.2 fL (ref 80.0–100.0)
Monocytes Absolute: 0.4 10*3/uL (ref 0.1–1.0)
Monocytes Relative: 10 %
Neutro Abs: 2.2 10*3/uL (ref 1.7–7.7)
Neutrophils Relative %: 48 %
Platelet Count: 218 10*3/uL (ref 150–400)
RBC: 4.59 MIL/uL (ref 4.22–5.81)
RDW: 13.4 % (ref 11.5–15.5)
WBC Count: 4.5 10*3/uL (ref 4.0–10.5)
nRBC: 0 % (ref 0.0–0.2)

## 2019-04-07 LAB — CMP (CANCER CENTER ONLY)
ALT: 19 U/L (ref 0–44)
AST: 17 U/L (ref 15–41)
Albumin: 4.6 g/dL (ref 3.5–5.0)
Alkaline Phosphatase: 34 U/L — ABNORMAL LOW (ref 38–126)
Anion gap: 6 (ref 5–15)
BUN: 13 mg/dL (ref 6–20)
CO2: 28 mmol/L (ref 22–32)
Calcium: 9.5 mg/dL (ref 8.9–10.3)
Chloride: 101 mmol/L (ref 98–111)
Creatinine: 1.35 mg/dL — ABNORMAL HIGH (ref 0.61–1.24)
GFR, Est AFR Am: >60 mL/min (ref >60)
GFR, Estimated: 57 mL/min — ABNORMAL LOW (ref >60)
Glucose, Bld: 104 mg/dL — ABNORMAL HIGH (ref 70–99)
Potassium: 4 mmol/L (ref 3.5–5.1)
Sodium: 135 mmol/L (ref 135–145)
Total Bilirubin: 1.1 mg/dL (ref 0.3–1.2)
Total Protein: 7.2 g/dL (ref 6.5–8.1)

## 2019-04-07 MED ORDER — DENOSUMAB 120 MG/1.7ML ~~LOC~~ SOLN
120.0000 mg | Freq: Once | SUBCUTANEOUS | Status: AC
  Administered 2019-04-07: 120 mg via SUBCUTANEOUS

## 2019-04-07 MED FILL — SILDENAFIL CITRATE 100 MG T: 100 | 17 days supply | Qty: 5 | Fill #1

## 2019-04-07 MED FILL — OMEGA-3 ETHYL ESTER 1 GM CA: 1 | 6 days supply | Qty: 16 | Fill #3

## 2019-04-07 NOTE — Progress Notes (Signed)
Hematology and Oncology Follow Up Visit  [facility] [id number] [birth year] 58 y.o. 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 Codispoti is here today for follow-up.  As always, he is doing quite well.  He is still working.  The coronavirus really has not slowed him down.  I guess the big news is that he and his wife are thinking about moving to [facility].  He and his wife really loves going to [facility].  The [place] have moved to [facility] which is another incentive for him.  He has had no problems with pain.  He has had no problems with nausea or vomiting.  He has had no problems with bowels or bladder.  We last saw him in February, there was no monoclonal spike in his blood.  His IgG level was 1600 mg/dL.  His kappa light chain was he is doing well and has no complaints at this time. He is doing well on Revlimid and verbalized that he is taking as prescribed.   In September, his myeloma studies showed no monoclonal spike in his blood.  His IgG G level was 1560 mg/dL.  His Kappa light chain was 2.1 mg/dL.    Currently, his performance status is ECOG 0.     Medications:  Allergies as of 04/07/2019   No Known Allergies     Medication List       Accurate as of Apr 07, 2019  9:43 AM. If you have any questions, ask your nurse or doctor.        amLODipine 5 MG tablet Commonly known as:  NORVASC Take 1 tablet (5 mg total) by mouth daily.   aspirin 325 MG EC tablet Take 325 mg by mouth daily.   cholecalciferol 25 MCG (1000 UT) tablet Commonly known as:  VITAMIN D3 Take 1 tablet (1,000 Units total) by mouth daily.   Lactulose 20 GM/30ML Soln Take 30 mLs (20 g total) by mouth every 4 (four) hours as needed (Take as directed until bowel movement.).   lenalidomide 10 MG  capsule Commonly known as:  REVLIMID Take one capsule by mouth daily for 21 days and then 7 days off. #[id number]   omega-3 acid ethyl esters 1 g capsule Commonly known as:  LOVAZA TAKE 2 CAPSULES (2 G TOTAL) BY MOUTH 2 (TWO) TIMES DAILY.   sildenafil 100 MG tablet Commonly known as:  Viagra Take 0.5-1 tablets (50-100 mg total) by mouth daily as needed for up to 30 days for erectile dysfunction.   tadalafil 20 MG tablet Commonly known as:  Cialis Take 1 tablet (20 mg total) by mouth daily as needed for erectile dysfunction.       Allergies: No Known Allergies  Past Medical History, Surgical history, Social history, and Family History were reviewed and updated.  Review of Systems: Review of Systems  Constitutional: Negative.   HENT: Negative.   Eyes: Negative.   Respiratory: Negative.   Cardiovascular: Negative.   Gastrointestinal: Negative.   Genitourinary: Negative.   Musculoskeletal: Negative.   Skin: Negative.   Neurological: Negative.   Endo/Heme/Allergies: Negative.   Psychiatric/Behavioral: Negative.      Physical Exam:  weight is 205 lb (93 kg). His oral temperature is 97.7 F (36.5 C). His blood pressure is 128/81  and his pulse is 59 (abnormal). His respiration is 18 and oxygen saturation is 100%.   Wt Readings from Last 3 Encounters:  04/07/19 205 lb (93 kg)  11/25/18 204 lb (92.5 kg)  10/07/18 208 lb (94.3 kg)    Physical Exam Vitals signs reviewed.  HENT:     Head: Normocephalic and atraumatic.  Eyes:     Pupils: Pupils are equal, round, and reactive to light.  Neck:     Musculoskeletal: Normal range of motion.  Cardiovascular:     Rate and Rhythm: Normal rate and regular rhythm.     Heart sounds: Normal heart sounds.  Pulmonary:     Effort: Pulmonary effort is normal.     Breath sounds: Normal breath sounds.  Abdominal:     General: Bowel sounds are normal.     Palpations: Abdomen is soft.  Musculoskeletal: Normal range of motion.         General: No tenderness or deformity.  Lymphadenopathy:     Cervical: No cervical adenopathy.  Skin:    General: Skin is warm and dry.     Findings: No erythema or rash.  Neurological:     Mental Status: He is alert and oriented to person, place, and time.  Psychiatric:        Behavior: Behavior normal.        Thought Content: Thought content normal.        Judgment: Judgment normal.       Lab Results  Component Value Date   WBC 4.5 04/07/2019   HGB 15.1 04/07/2019   HCT 42.3 04/07/2019   MCV 92.2 04/07/2019   PLT 218 04/07/2019   Lab Results  Component Value Date   FERRITIN 152 08/17/2014   IRON 106 08/17/2014   TIBC 253 08/17/2014   UIBC 146 08/17/2014   IRONPCTSAT 42 08/17/2014   Lab Results  Component Value Date   RBC 4.59 04/07/2019   Lab Results  Component Value Date   KPAFRELGTCHN 21.1 (H) 01/05/2019   LAMBDASER 12.5 01/05/2019   KAPLAMBRATIO 1.69 (H) 01/05/2019   Lab Results  Component Value Date   IGGSERUM 1,596 01/05/2019   IGA 18 (L) 01/05/2019   IGMSERUM 18 (L) 01/05/2019   Lab Results  Component Value Date   TOTALPROTELP 6.8 01/05/2019   ALBUMINELP 3.9 01/05/2019   A1GS 0.2 01/05/2019   A2GS 0.4 01/05/2019   BETS 0.8 01/05/2019   BETA2SER 0.2 10/04/2015   GAMS 1.5 01/05/2019   MSPIKE Not Observed 01/05/2019   SPEI Comment 01/05/2019     Chemistry      Component Value Date/Time   NA 138 01/05/2019 0904   NA 139 08/13/2017 0918   NA 142 04/01/2015 0834   K 3.7 01/05/2019 0904   K 3.6 08/13/2017 0918   K 3.4 (L) 04/01/2015 0834   CL 105 01/05/2019 0904   CL 105 08/13/2017 0918   CO2 27 01/05/2019 0904   CO2 28 08/13/2017 0918   CO2 20 (L) 04/01/2015 0834   BUN 15 01/05/2019 0904   BUN 8 08/13/2017 0918   BUN 10.4 04/01/2015 0834   CREATININE 1.31 (H) 01/05/2019 0904   CREATININE 1.4 (H) 08/13/2017 0918   CREATININE 1.2 04/01/2015 0834      Component Value Date/Time   CALCIUM 9.1 01/05/2019 0904   CALCIUM 9.1 08/13/2017 0918    CALCIUM 8.6 04/01/2015 0834   ALKPHOS 36 (L) 01/05/2019 0904   ALKPHOS 42 08/13/2017 0918   ALKPHOS 39 (L) 04/01/2015  0834   AST 16 01/05/2019 0904   AST 17 04/01/2015 0834   ALT 20 01/05/2019 0904   ALT 21 08/13/2017 0918   ALT 19 04/01/2015 0834   BILITOT 1.0 01/05/2019 0904   BILITOT 0.56 04/01/2015 0834      Impression and Plan: Mr. Milstein is a pleasant 58 yo African American gentleman with IgG kappa myeloma.  He had induction therapy with RVD.  He then underwent s/p autologous stem [facility] in April 2016. He is doing well and is asymptomatic at this time.   Hopefully, he will still be in remission.  It is now been 4 years since he had his transplant.  He gets his Xgeva.  We will plan to get him back in another 3 months.  He will certainly let us know when he is going to move.  I still have to let him use my car for the weekend.  I lost my bet last year with him.  I am going to make sure that I hold up to that bet.   [name], MD 5/15/20209:43 AM

## 2019-04-07 NOTE — Patient Instructions (Signed)
Denosumab injection (Xgeva) What is this medicine? DENOSUMAB (den oh sue mab) slows bone breakdown. Prolia is used to treat osteoporosis in women after menopause and in men, and in people who are taking corticosteroids for 6 months or more. Xgeva is used to treat a high calcium level due to cancer and to prevent bone fractures and other bone problems caused by multiple myeloma or cancer bone metastases. Xgeva is also used to treat giant cell tumor of the bone. This medicine may be used for other purposes; ask your health care provider or pharmacist if you have questions. COMMON BRAND NAME(S): Prolia, XGEVA What should I tell my health care provider before I take this medicine? They need to know if you have any of these conditions: -dental disease -having surgery or tooth extraction -infection -kidney disease -low levels of calcium or Vitamin D in the blood -malnutrition -on hemodialysis -skin conditions or sensitivity -thyroid or parathyroid disease -an unusual reaction to denosumab, other medicines, foods, dyes, or preservatives -pregnant or trying to get pregnant -breast-feeding How should I use this medicine? This medicine is for injection under the skin. It is given by a health care professional in a hospital or clinic setting. A special MedGuide will be given to you before each treatment. Be sure to read this information carefully each time. For Prolia, talk to your pediatrician regarding the use of this medicine in children. Special care may be needed. For Xgeva, talk to your pediatrician regarding the use of this medicine in children. While this drug may be prescribed for children as young as 13 years for selected conditions, precautions do apply. Overdosage: If you think you have taken too much of this medicine contact a poison control center or emergency room at once. NOTE: This medicine is only for you. Do not share this medicine with others. What if I miss a dose? It is important  not to miss your dose. Call your doctor or health care professional if you are unable to keep an appointment. What may interact with this medicine? Do not take this medicine with any of the following medications: -other medicines containing denosumab This medicine may also interact with the following medications: -medicines that lower your chance of fighting infection -steroid medicines like prednisone or cortisone This list may not describe all possible interactions. Give your health care provider a list of all the medicines, herbs, non-prescription drugs, or dietary supplements you use. Also tell them if you smoke, drink alcohol, or use illegal drugs. Some items may interact with your medicine. What should I watch for while using this medicine? Visit your doctor or health care professional for regular checks on your progress. Your doctor or health care professional may order blood tests and other tests to see how you are doing. Call your doctor or health care professional for advice if you get a fever, chills or sore throat, or other symptoms of a cold or flu. Do not treat yourself. This drug may decrease your body's ability to fight infection. Try to avoid being around people who are sick. You should make sure you get enough calcium and vitamin D while you are taking this medicine, unless your doctor tells you not to. Discuss the foods you eat and the vitamins you take with your health care professional. See your dentist regularly. Brush and floss your teeth as directed. Before you have any dental work done, tell your dentist you are receiving this medicine. Do not become pregnant while taking this medicine or for 5   months after stopping it. Talk with your doctor or health care professional about your birth control options while taking this medicine. Women should inform their doctor if they wish to become pregnant or think they might be pregnant. There is a potential for serious side effects to an  unborn child. Talk to your health care professional or pharmacist for more information. What side effects may I notice from receiving this medicine? Side effects that you should report to your doctor or health care professional as soon as possible: -allergic reactions like skin rash, itching or hives, swelling of the face, lips, or tongue -bone pain -breathing problems -dizziness -jaw pain, especially after dental work -redness, blistering, peeling of the skin -signs and symptoms of infection like fever or chills; cough; sore throat; pain or trouble passing urine -signs of low calcium like fast heartbeat, muscle cramps or muscle pain; pain, tingling, numbness in the hands or feet; seizures -unusual bleeding or bruising -unusually weak or tired Side effects that usually do not require medical attention (report to your doctor or health care professional if they continue or are bothersome): -constipation -diarrhea -headache -joint pain -loss of appetite -muscle pain -runny nose -tiredness -upset stomach This list may not describe all possible side effects. Call your doctor for medical advice about side effects. You may report side effects to FDA at 1-800-FDA-1088. Where should I keep my medicine? This medicine is only given in a clinic, doctor's office, or other health care setting and will not be stored at home. NOTE: This sheet is a summary. It may not cover all possible information. If you have questions about this medicine, talk to your doctor, pharmacist, or health care provider.  2019 Elsevier/Gold Standard (2018-03-18 16:10:44)

## 2019-04-10 LAB — MULTIPLE MYELOMA PANEL, SERUM
Albumin SerPl Elph-Mcnc: 4.2 g/dL (ref 2.9–4.4)
Albumin/Glob SerPl: 1.4 (ref 0.7–1.7)
Alpha 1: 0.2 g/dL (ref 0.0–0.4)
Alpha2 Glob SerPl Elph-Mcnc: 0.7 g/dL (ref 0.4–1.0)
B-Globulin SerPl Elph-Mcnc: 0.7 g/dL (ref 0.7–1.3)
Gamma Glob SerPl Elph-Mcnc: 1.7 g/dL (ref 0.4–1.8)
Globulin, Total: 3.2 g/dL (ref 2.2–3.9)
IgA: 17 mg/dL — ABNORMAL LOW (ref 90–386)
IgG (Immunoglobin G), Serum: 1901 mg/dL — ABNORMAL HIGH (ref 603–1613)
IgM (Immunoglobulin M), Srm: 15 mg/dL — ABNORMAL LOW (ref 20–172)
Total Protein ELP: 7.4 g/dL (ref 6.0–8.5)

## 2019-04-10 LAB — KAPPA/LAMBDA LIGHT CHAINS
Kappa free light chain: 18.4 mg/L (ref 3.3–19.4)
Kappa, lambda light chain ratio: 1.74 — ABNORMAL HIGH (ref 0.26–1.65)
Lambda free light chains: 10.6 mg/L (ref 5.7–26.3)

## 2019-04-11 ENCOUNTER — Telehealth: Payer: Self-pay | Admitting: *Deleted

## 2019-04-11 NOTE — Telephone Encounter (Signed)
Pt notified per order of Dr. Marin Olp that there is still no myeloma in the blood.  Pt appreciative of call and has no questions or concerns at this time.

## 2019-04-11 NOTE — Telephone Encounter (Signed)
-----   Message from [name], MD sent at 04/11/2019  1:59 PM EDT ----- Call - still no myeloma in the blood!!!  Laurey Arrow

## 2019-04-12 ENCOUNTER — Other Ambulatory Visit: Payer: Self-pay | Admitting: *Deleted

## 2019-04-12 DIAGNOSIS — C9000 Multiple myeloma not having achieved remission: Secondary | ICD-10-CM

## 2019-04-12 MED ORDER — LENALIDOMIDE 10 MG PO CAPS: ORAL_CAPSULE | ORAL | 0 refills | Status: DC

## 2019-04-19 ENCOUNTER — Encounter: Payer: 59 | Admitting: Family

## 2019-04-26 ENCOUNTER — Encounter: Payer: 59 | Admitting: Family

## 2019-05-09 ENCOUNTER — Ambulatory Visit (INDEPENDENT_AMBULATORY_CARE_PROVIDER_SITE_OTHER): Payer: 59 | Admitting: Family

## 2019-05-09 ENCOUNTER — Other Ambulatory Visit: Payer: Self-pay

## 2019-05-09 ENCOUNTER — Encounter: Payer: Self-pay | Admitting: Family

## 2019-05-09 ENCOUNTER — Other Ambulatory Visit: Payer: Self-pay | Admitting: Family

## 2019-05-09 VITALS — BP 133/76 | HR 51 | Temp 97.9°F | Resp 16 | Ht 71.5 in | Wt 206.0 lb

## 2019-05-09 DIAGNOSIS — I10 Essential (primary) hypertension: Secondary | ICD-10-CM | POA: Diagnosis not present

## 2019-05-09 DIAGNOSIS — C9000 Multiple myeloma not having achieved remission: Secondary | ICD-10-CM

## 2019-05-09 DIAGNOSIS — E781 Pure hyperglyceridemia: Secondary | ICD-10-CM | POA: Diagnosis not present

## 2019-05-09 DIAGNOSIS — R972 Elevated prostate specific antigen [PSA]: Secondary | ICD-10-CM

## 2019-05-09 MED FILL — SILDENAFIL CITRATE 100 MG T: 100 | 17 days supply | Qty: 5 | Fill #2

## 2019-05-09 MED FILL — OMEGA-3 ETHYL ESTER 1 GM CA: 1 | 6 days supply | Qty: 16 | Fill #4

## 2019-05-09 NOTE — Patient Instructions (Signed)
Please schedule a follow up with Urology

## 2019-05-09 NOTE — Progress Notes (Signed)
Subjective:    Patient ID: Jimmy Day, male    DOB: 08/31/1961, 58 y.o.   MRN: [id number]  HPI  Patient is a 58 yr old male who presents today for routine follow up.  HTN- maintained on amlodipine 5 mg.  BP Readings from Last 3 Encounters:  05/09/19 133/76  04/07/19 128/81  01/05/19 121/84   Hypertriglyceridemia- maintained on lovaza.  Lab Results  Component Value Date   CHOL 162 11/25/2018   HDL 35.80 (L) 11/25/2018   LDLCALC 101 (H) 11/25/2018   LDLDIRECT 93.0 11/26/2017   TRIG 122.0 11/25/2018   CHOLHDL 5 11/25/2018   Kappy Light Chain Myeloma- he is s/p autologous stem cell transplant at [facility] 2016. He is maintained on Revlimid Xeva and followed by oncology.   ED- using generic viagra. Found citalopram too costly.   Urology is following, he is due to reschedule.    Lab Results  Component Value Date   PSA 4.47 (H) 11/26/2017   PSA 0.97 04/08/2009       Review of Systems See HPI  Past Medical History:  Diagnosis Date  . Elevated PSA 11/29/2017  . Fasting hyperglycemia    NORMAL A1c  . Hyperlipidemia   . Hypertension   . Kappa light chain myeloma ([place]) 02/02/2014  . Testosterone deficiency    Dr Hartley Barefoot     Social History   Socioeconomic History  . Marital status: Married    Spouse name: Not on file  . Number of children: 3  . Years of education: Not on file  . Highest education level: Not on file  Occupational History  . Occupation: Truck Education administrator: Scientist, physiological  Social Needs  . Financial resource strain: Not on file  . Food insecurity    Worry: Not on file    Inability: Not on file  . Transportation needs    Medical: Not on file    Non-medical: Not on file  Tobacco Use  . Smoking status: Never Smoker  . Smokeless tobacco: Never Used  . Tobacco comment: never used tobacco  Substance and Sexual Activity  . Alcohol use: Yes    Alcohol/week: 0.0 standard drinks    Comment:  rarely  . Drug use: No  . Sexual activity: Yes     Birth control/protection: Condom  Lifestyle  . Physical activity    Days per week: Not on file    Minutes per session: Not on file  . Stress: Not on file  Relationships  . Social Herbalist on phone: Not on file    Gets together: Not on file    Attends religious service: Not on file    Active member of club or organization: Not on file    Attends meetings of clubs or organizations: Not on file    Relationship status: Not on file  . Intimate partner violence    Fear of current or ex partner: Not on file    Emotionally abused: Not on file    Physically abused: Not on file    Forced sexual activity: Not on file  Other Topics Concern  . 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 CERVICAL  PLATE INSERTION     POST COMPRESSION DISC INJURY  . LIPOMAS REMOVAL     BENIGN FROM CHEST AND BACK    Family History  Problem Relation Age of Onset  . Breast cancer Mother   . Heart attack Father 37  . Kidney disease Brother        RENAL FAILURE  . Hypertension Brother   . Heart attack Maternal Aunt 65       CABG  . Stroke Maternal Uncle        CVA  . Diabetes Neg Hx   . Colon cancer Neg Hx   . Esophageal cancer Neg Hx   . Rectal cancer Neg Hx   . Stomach cancer Neg Hx   . Colon polyps Neg Hx     No Known Allergies  Current Outpatient Medications on File Prior to Visit  Medication Sig Dispense Refill  . amLODipine (NORVASC) 5 MG tablet Take 1 tablet (5 mg total) by mouth daily. 90 tablet 1  . aspirin 325 MG EC tablet Take 325 mg by mouth daily.    . cholecalciferol (VITAMIN D3) 25 MCG (1000 UT) tablet Take 1 tablet (1,000 Units total) by mouth daily.    . Lactulose 20 GM/30ML SOLN Take 30 mLs (20 g total) by mouth every 4  (four) hours as needed (Take as directed until bowel movement.). 236 mL 2  . lenalidomide (REVLIMID) 10 MG capsule Take one capsule by mouth daily for 21 days and then 7 days off. #[id number] 21 capsule 0  . omega-3 acid ethyl esters (LOVAZA) 1 g capsule TAKE 2 CAPSULES (2 G TOTAL) BY MOUTH 2 (TWO) TIMES DAILY. 60 capsule 5  . sildenafil (VIAGRA) 100 MG tablet Take 0.5-1 tablets (50-100 mg total) by mouth daily as needed for up to 30 days for erectile dysfunction. 12 tablet 11   Current Facility-Administered Medications on File Prior to Visit  Medication Dose Route Frequency Provider Last Rate Last Dose  . 0.9 %  sodium chloride infusion  500 mL Intravenous Once Irene Shipper, MD        BP 133/76 (BP Location: Right Arm, Patient Position: Sitting, Cuff Size: Large)   Pulse (!) 51   Temp 97.9 F (36.6 C) (Oral)   Resp 16   Ht 5' 11.5" (1.816 m)   Wt 206 lb (93.4 kg)   SpO2 100%   BMI 28.33 kg/m       Objective:   Physical Exam Constitutional:      General: He is not in acute distress.    Appearance: He is well-developed.  HENT:     Head: Normocephalic and atraumatic.  Cardiovascular:     Rate and Rhythm: Normal rate and regular rhythm.     Heart sounds: No murmur.  Pulmonary:     Effort: Pulmonary effort is normal. No respiratory distress.     Breath sounds: Normal breath sounds. No wheezing or rales.  Skin:    General: Skin is warm and dry.  Neurological:     Mental Status: He is alert and oriented to person, place, and time.  Psychiatric:        Behavior: Behavior normal.        Thought Content: Thought content normal.           Assessment & Plan:  HTN- bp is stable on current medication. Continue current dose of amlodipine.  Hypertriglyceridemia- stable on lovaza, continue same.  Elevated PSA- he is past due for follow up with  urology. I advised him to schedule follow up appointment and he agrees to do so.  Kappa light Chain Myeloma- stable- management per  hematology.  ED- stable with PRN viagra.

## 2019-05-10 ENCOUNTER — Other Ambulatory Visit: Payer: Self-pay | Admitting: *Deleted

## 2019-05-10 DIAGNOSIS — C9000 Multiple myeloma not having achieved remission: Secondary | ICD-10-CM

## 2019-05-10 MED ORDER — LENALIDOMIDE 10 MG PO CAPS: ORAL_CAPSULE | ORAL | 0 refills | Status: DC

## 2019-05-22 ENCOUNTER — Other Ambulatory Visit: Payer: Self-pay

## 2019-05-22 ENCOUNTER — Encounter (HOSPITAL_BASED_OUTPATIENT_CLINIC_OR_DEPARTMENT_OTHER): Payer: Self-pay | Admitting: *Deleted

## 2019-05-22 ENCOUNTER — Emergency Department (HOSPITAL_BASED_OUTPATIENT_CLINIC_OR_DEPARTMENT_OTHER)
Admission: EM | Admit: 2019-05-22 | Discharge: 2019-05-22 | Disposition: A | Discharge status: Home / Self Care | Payer: 59 | Attending: Emergency Medicine | Admitting: Emergency Medicine

## 2019-05-22 ENCOUNTER — Emergency Department (HOSPITAL_BASED_OUTPATIENT_CLINIC_OR_DEPARTMENT_OTHER): Payer: 59

## 2019-05-22 DIAGNOSIS — Z79899 Other long term (current) drug therapy: Secondary | ICD-10-CM | POA: Diagnosis not present

## 2019-05-22 DIAGNOSIS — E860 Dehydration: Secondary | ICD-10-CM | POA: Insufficient documentation

## 2019-05-22 DIAGNOSIS — Z7982 Long term (current) use of aspirin: Secondary | ICD-10-CM | POA: Insufficient documentation

## 2019-05-22 DIAGNOSIS — N390 Urinary tract infection, site not specified: Secondary | ICD-10-CM

## 2019-05-22 DIAGNOSIS — R300 Dysuria: Secondary | ICD-10-CM | POA: Diagnosis present

## 2019-05-22 DIAGNOSIS — R1030 Lower abdominal pain, unspecified: Secondary | ICD-10-CM | POA: Diagnosis not present

## 2019-05-22 DIAGNOSIS — I10 Essential (primary) hypertension: Secondary | ICD-10-CM | POA: Diagnosis not present

## 2019-05-22 DIAGNOSIS — R5383 Other fatigue: Secondary | ICD-10-CM | POA: Diagnosis not present

## 2019-05-22 LAB — CBC WITH DIFFERENTIAL/PLATELET
Abs Immature Granulocytes: 0.03 10*3/uL (ref 0.00–0.07)
Basophils Absolute: 0.1 10*3/uL (ref 0.0–0.1)
Basophils Relative: 1 %
Eosinophils Absolute: 0.1 10*3/uL (ref 0.0–0.5)
Eosinophils Relative: 2 %
HCT: 42 % (ref 39.0–52.0)
Hemoglobin: 14 g/dL (ref 13.0–17.0)
Immature Granulocytes: 0 %
Lymphocytes Relative: 13 %
Lymphs Abs: 1 10*3/uL (ref 0.7–4.0)
MCH: 31.9 pg (ref 26.0–34.0)
MCHC: 33.3 g/dL (ref 30.0–36.0)
MCV: 95.7 fL (ref 80.0–100.0)
Monocytes Absolute: 0.8 10*3/uL (ref 0.1–1.0)
Monocytes Relative: 10 %
Neutro Abs: 6 10*3/uL (ref 1.7–7.7)
Neutrophils Relative %: 74 %
Platelets: 168 10*3/uL (ref 150–400)
RBC: 4.39 MIL/uL (ref 4.22–5.81)
RDW: 13.7 % (ref 11.5–15.5)
WBC: 8 10*3/uL (ref 4.0–10.5)
nRBC: 0 % (ref 0.0–0.2)

## 2019-05-22 LAB — COMPREHENSIVE METABOLIC PANEL
ALT: 16 U/L (ref 0–44)
AST: 16 U/L (ref 15–41)
Albumin: 4 g/dL (ref 3.5–5.0)
Alkaline Phosphatase: 38 U/L (ref 38–126)
Anion gap: 10 (ref 5–15)
BUN: 16 mg/dL (ref 6–20)
CO2: 25 mmol/L (ref 22–32)
Calcium: 8.7 mg/dL — ABNORMAL LOW (ref 8.9–10.3)
Chloride: 102 mmol/L (ref 98–111)
Creatinine, Ser: 1.5 mg/dL — ABNORMAL HIGH (ref 0.61–1.24)
GFR calc Af Amer: 59 mL/min — ABNORMAL LOW (ref >60)
GFR calc non Af Amer: 51 mL/min — ABNORMAL LOW (ref >60)
Glucose, Bld: 87 mg/dL (ref 70–99)
Potassium: 3.5 mmol/L (ref 3.5–5.1)
Sodium: 137 mmol/L (ref 135–145)
Total Bilirubin: 1.3 mg/dL — ABNORMAL HIGH (ref 0.3–1.2)
Total Protein: 7.4 g/dL (ref 6.5–8.1)

## 2019-05-22 LAB — URINALYSIS, ROUTINE W REFLEX MICROSCOPIC
Bilirubin Urine: NEGATIVE
Glucose, UA: NEGATIVE mg/dL
Ketones, ur: NEGATIVE mg/dL
Nitrite: NEGATIVE
Protein, ur: 100 mg/dL — AB
Specific Gravity, Urine: >1.03 — ABNORMAL HIGH (ref 1.005–1.030)
pH: 6 (ref 5.0–8.0)

## 2019-05-22 LAB — URINALYSIS, MICROSCOPIC (REFLEX)
RBC / HPF: >50 RBC/hpf (ref 0–5)
WBC, UA: >50 WBC/hpf (ref 0–5)

## 2019-05-22 MED ORDER — CEPHALEXIN 500 MG PO CAPS: 1000.0000 mg | ORAL_CAPSULE | Freq: Two times a day (BID) | ORAL | 0 refills | Status: DC

## 2019-05-22 MED ORDER — SODIUM CHLORIDE 0.9 % IV BOLUS
1000.0000 mL | Freq: Once | INTRAVENOUS | Status: AC
  Administered 2019-05-22: 1000 mL via INTRAVENOUS

## 2019-05-22 MED ORDER — SODIUM CHLORIDE 0.9 % IV SOLN
1.0000 g | Freq: Once | INTRAVENOUS | Status: AC
  Administered 2019-05-22: 1 g via INTRAVENOUS
  Filled 2019-05-22: qty 10

## 2019-05-22 MED FILL — OMEGA-3 ETHYL ESTER 1 GM CA: 1 | 4 days supply | Qty: 14 | Fill #5

## 2019-05-22 MED FILL — AMLODIPINE BESYLATE 5 MG TA: 5 | 90 days supply | Qty: 90 | Fill #0

## 2019-05-22 MED FILL — CEPHALEXIN 500 MG CAPSULE: 500 | 7 days supply | Qty: 28 | Fill #0

## 2019-05-22 NOTE — ED Notes (Signed)
ED Provider at bedside. 

## 2019-05-22 NOTE — ED Triage Notes (Signed)
Dysuria and fatigue since yesterday.

## 2019-05-22 NOTE — ED Notes (Signed)
Pt. Reports he is here for fatigue and weakness and reports this all started on Sunday morning.  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  Pt. Reports he has had headaches.  Pt. Goes round and round with the symptoms and back and forth with his days and times.  Pt. Reports his painful urination.  Pt. Reports the headache started on the L and now on the R side of the head.  Pt. Has not taken anything for the headache.  Pt. Is alert and speech is clear.  Pt. Reports his urination has been painful and painful into the rectum with urgency.  Pt. Stated he was in a hotel in [place] and went approx. 4 times for about 2seconds.   Pt. Reports he has a nervous quiver feeling in the R side and the urine is dark but he drinks beet juice also.

## 2019-05-22 NOTE — Discharge Instructions (Signed)
Return here as needed.  Follow-up as soon as possible with your primary care provider.  Increase your fluid intake and rest as much as possible.  Tylenol and Motrin for any headache.

## 2019-05-22 NOTE — ED Provider Notes (Signed)
[place] EMERGENCY DEPARTMENT Provider Note   CSN: [id number] Arrival date & time: 05/22/19  1248     History   Chief Complaint Chief Complaint  Patient presents with   Dysuria   Fatigue    HPI Jimmy Day is a 58 y.o. male.     HPI Patient presents to the emergency department with increased fatigue and weakness that started on Sunday.  The patient states that he has had some mild headache associated with this as well.  He states he has had difficulty with urination and pain with urination.  The patient states that he has not taken anything for his headache.  The patient states that he has pain in the lower abdominal region over the bladder.  The patient denies chest pain, shortness of breath, blurred vision, neck pain, fever, cough, weakness, numbness, dizziness, anorexia, edema, abdominal pain, nausea, vomiting, diarrhea, rash, back pain,  hematemesis, bloody stool, near syncope, or syncope. 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   Vitamin D deficiency 10/01/2014   Kappa light chain myeloma ([place]) 02/02/2014   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 Authorizing Provider  amLODipine (NORVASC) 5 MG tablet TAKE 1 TABLET (5 MG TOTAL) BY MOUTH DAILY. 05/10/19   [name], NP  aspirin 325 MG EC tablet Take 325 mg by mouth daily.    [provider]  cholecalciferol (VITAMIN D3) 25 MCG (1000 UT) tablet Take 1 tablet (1,000 Units total) by mouth daily. 11/28/18   [name], NP  Lactulose 20 GM/30ML SOLN Take 30 mLs (20 g total) by mouth every 4 (four) hours as needed (Take as directed until bowel movement.). 12/06/17   [name], MD  lenalidomide (REVLIMID) 10 MG capsule Take one capsule by mouth daily for 21 days and then 7 days off. #[id number] 05/10/19   [name], MD  omega-3 acid ethyl esters (LOVAZA) 1 g capsule TAKE 2 CAPSULES (2 G TOTAL) BY MOUTH 2 (TWO) TIMES DAILY. 12/23/18   [name], NP  sildenafil (VIAGRA) 100 MG tablet Take 0.5-1 tablets (50-100 mg total) by mouth daily as needed for up to 30 days for erectile dysfunction. 02/16/19 03/18/19  [name], NP    Family History Family History  Problem Relation Age of Onset   Breast cancer Mother    Heart attack Father 79   Kidney disease Brother        RENAL FAILURE  Hypertension Brother    Heart attack Maternal Aunt 65       CABG   Stroke Maternal Uncle        CVA   Diabetes Neg Hx    Colon cancer Neg Hx    Esophageal cancer Neg Hx    Rectal cancer Neg Hx    Stomach cancer Neg Hx    Colon polyps Neg Hx     Social History Social History   Tobacco Use   Smoking status: Never Smoker   Smokeless tobacco: Never Used   Tobacco comment: never used tobacco  Substance Use Topics   Alcohol use: Yes    Alcohol/week: 0.0 standard drinks    Comment:  rarely   Drug use: No     Allergies   Patient has no known allergies.   Review of  Systems Review of Systems All other systems negative except as documented in the HPI. All pertinent positives and negatives as reviewed in the HPI.  Physical Exam Updated Vital Signs BP 138/89 (BP Location: Left Arm)    Pulse 75    Temp 98.7 F (37.1 C) (Oral)    Resp 14    Ht '5\' 11"'  (1.803 m)    Wt 91.2 kg    SpO2 95%    BMI 28.03 kg/m   Physical Exam Vitals signs and nursing note reviewed.  Constitutional:      General: He is not in acute distress.    Appearance: He is well-developed.  HENT:     Head: Normocephalic and atraumatic.  Eyes:     Pupils: Pupils are equal, round, and reactive to light.  Neck:     Musculoskeletal: Normal range of motion and neck supple.  Cardiovascular:     Rate and Rhythm: Normal rate and regular rhythm.     Heart sounds: Normal heart sounds. No murmur. No friction rub. No gallop.   Pulmonary:     Effort: Pulmonary effort is normal. No respiratory distress.     Breath sounds: Normal breath sounds. No wheezing.  Abdominal:     General: Bowel sounds are normal. There is no distension.     Palpations: Abdomen is soft.     Tenderness: There is no abdominal tenderness.  Skin:    General: Skin is warm and dry.     Capillary Refill: Capillary refill takes less than 2 seconds.     Findings: No erythema or rash.  Neurological:     Mental Status: He is alert and oriented to person, place, and time.     Motor: No abnormal muscle tone.     Coordination: Coordination normal.  Psychiatric:        Behavior: Behavior normal.      ED Treatments / Results  Labs (all labs ordered are listed, but only abnormal results are displayed) Labs Reviewed  URINALYSIS, ROUTINE W REFLEX MICROSCOPIC - Abnormal; Notable for the following components:      Result Value   APPearance CLOUDY (*)    Specific Gravity, Urine >1.030 (*)    Hgb urine dipstick LARGE (*)    Protein, ur 100 (*)    Leukocytes,Ua MODERATE (*)    All other components within normal limits    URINALYSIS, MICROSCOPIC (REFLEX) - Abnormal; Notable for the following components:   Bacteria, UA MANY (*)    All other components within normal limits  COMPREHENSIVE METABOLIC PANEL - Abnormal; Notable for the following components:   Creatinine, Ser 1.50 (*)    Calcium 8.7 (*)  Total Bilirubin 1.3 (*)    GFR calc non Af Amer 51 (*)    GFR calc Af Amer 59 (*)    All other components within normal limits  URINE CULTURE  CBC WITH DIFFERENTIAL/PLATELET    EKG    Radiology Ct Renal Stone Study  Result Date: 05/22/2019 CLINICAL DATA:  Fatigue with weakness, low abdominal pain and urinary frequency since yesterday. History of multiple myeloma. EXAM: CT ABDOMEN AND PELVIS WITHOUT CONTRAST TECHNIQUE: Multidetector CT imaging of the abdomen and pelvis was performed following the standard protocol without IV contrast. COMPARISON:  CT 01/28/2014 and 14 2014. FINDINGS: Lower chest: Clear lung bases. No significant pleural or pericardial effusion. Hepatobiliary: The liver demonstrates no suspicious findings as imaged in the noncontrast state. There is a 12 mm cyst in the left hepatic lobe on image 11/2 which is stable. No evidence of gallstones, gallbladder wall thickening or biliary dilatation. Pancreas: Unremarkable. No pancreatic ductal dilatation or surrounding inflammatory changes. Spleen: Normal in size without focal abnormality. Adrenals/Urinary Tract: Both adrenal glands appear normal. The kidneys, ureters and bladder appear unremarkable. There is no evidence of urinary tract calculus or hydronephrosis. The bladder is incompletely distended. Stomach/Bowel: No evidence of bowel wall thickening, distention or surrounding inflammatory change. The appendix appears normal. Vascular/Lymphatic: There are no enlarged abdominal or pelvic lymph nodes. No significant vascular findings on noncontrast imaging. Reproductive: Mild enlargement of the prostate gland, similar to previous study. Other: No evidence  of abdominal wall mass or hernia. No ascites. Musculoskeletal: No acute osseous findings. There are numerous lucent lesions scattered throughout the bones which are similar to the previous study. No pathologic fracture. IMPRESSION: 1. No acute findings or explanation for the patient's symptoms. The bladder is decompressed, without apparent focal abnormality. 2. Scattered lucent lesions throughout the bones do not appear significantly changed, attributed to the patient's multiple myeloma. Electronically Signed   By: Richardean Sale M.D.   On: 05/22/2019 13:57    Procedures Procedures (including critical care time)  Medications Ordered in ED Medications  sodium chloride 0.9 % bolus 1,000 mL ( Intravenous Stopped 05/22/19 1510)  cefTRIAXone (ROCEPHIN) 1 g in sodium chloride 0.9 % 100 mL IVPB (0 g Intravenous Stopped 05/22/19 1512)     Initial Impression / Assessment and Plan / ED Course  I have reviewed the triage vital signs and the nursing notes.  Pertinent labs & imaging results that were available during my care of the patient were reviewed by me and considered in my medical decision making (see chart for details).       Patient is given IV fluids along with Rocephin IV for urinary tract infection.  Have advised him that he will need to follow-up with his primary doctor soon as possible for recheck.  Patient's urine was concentrated and we did give him IV fluids to help with this.  Patient has had some renal impairment in the past but this is not significantly changed today. Final Clinical Impressions(s) / ED Diagnoses   Final diagnoses:  None    ED Discharge Orders    None       Dalia Heading, PA-C 05/22/19 1524    Isla Pence, MD 05/24/19 1429

## 2019-05-23 ENCOUNTER — Telehealth: Payer: Self-pay | Admitting: Family

## 2019-05-23 NOTE — Telephone Encounter (Signed)
Please contact pt to arrange an ED follow up visit with me. OK to do virtual or face to face- pt preference.

## 2019-05-23 NOTE — Telephone Encounter (Signed)
Done

## 2019-05-24 LAB — URINE CULTURE: Culture: >=100000 — AB

## 2019-05-25 ENCOUNTER — Telehealth: Payer: Self-pay

## 2019-05-25 NOTE — Telephone Encounter (Signed)
Post ED Visit - Positive Culture Follow-up  Culture report reviewed by antimicrobial stewardship pharmacist: [facility] []  Elenor Quinones, Pharm.D. []  Heide Guile, Pharm.D., BCPS AQ-ID []  Parks Neptune, Pharm.D., BCPS []  Alycia Rossetti, Pharm.D., BCPS []  [place], Pharm.D., BCPS, AAHIVP []  Legrand Como, Pharm.D., BCPS, AAHIVP []  Salome Arnt, PharmD, BCPS []  Johnnette Gourd, PharmD, BCPS []  Hughes Better, PharmD, BCPS []  Leeroy Cha, PharmD []  Laqueta Linden, PharmD, BCPS []  Albertina Parr, PharmD [place] Team []  Leodis Sias, PharmD []  Lindell Spar, PharmD []  Royetta Asal, PharmD []  Graylin Shiver, Rph []  Rema Fendt) Glennon Mac, PharmD []  Arlyn Dunning, PharmD []  Netta Cedars, PharmD []  Dia Sitter, PharmD []  Leone Haven, PharmD []  Gretta Arab, PharmD []  Theodis Shove, PharmD []  Peggyann Juba, PharmD []  Reuel Boom, PharmD   Positive urine culture Treated with Cephalexin, organism sensitive to the same and no further patient follow-up is required at this time.  Genia Del 05/25/2019, 10:18 AM

## 2019-05-31 ENCOUNTER — Other Ambulatory Visit: Payer: Self-pay

## 2019-05-31 ENCOUNTER — Encounter: Payer: Self-pay | Admitting: Family

## 2019-05-31 ENCOUNTER — Ambulatory Visit: Payer: 59 | Admitting: Family

## 2019-05-31 VITALS — BP 116/72 | HR 62 | Temp 98.4°F | Resp 16 | Ht 71.0 in | Wt 199.0 lb

## 2019-05-31 DIAGNOSIS — N289 Disorder of kidney and ureter, unspecified: Secondary | ICD-10-CM

## 2019-05-31 DIAGNOSIS — R972 Elevated prostate specific antigen [PSA]: Secondary | ICD-10-CM

## 2019-05-31 DIAGNOSIS — N3001 Acute cystitis with hematuria: Secondary | ICD-10-CM | POA: Diagnosis not present

## 2019-05-31 LAB — POC URINALSYSI DIPSTICK (AUTOMATED)
Bilirubin, UA: NEGATIVE
Glucose, UA: NEGATIVE
Ketones, UA: NEGATIVE
Leukocytes, UA: NEGATIVE
Nitrite, UA: NEGATIVE
Protein, UA: POSITIVE — AB
Spec Grav, UA: 1.025 (ref 1.010–1.025)
Urobilinogen, UA: 0.2 E.U./dL
pH, UA: 6 (ref 5.0–8.0)

## 2019-05-31 NOTE — Progress Notes (Signed)
Subjective:    Patient ID: Jimmy Day, male    DOB: [birth year], 58 y.o.   MRN: [id number]  HPI  Patient is a 58 yr old male who presents today for ED follow up. He presented to the ED on 05/22/19 with c/o fatigue and weakness.  UA noted + leuks and blood.  Cr was up slightly from 1.35 to 1.5. Urine culture noted >100 K of E coli, which is pan sensitive.  He was discharged home on Keflex.  Reports that he is feeling much better following treatment with Keflex.  He reports that he is voiding without difficulty.  Denies current dysuria or weakness.  He has returned to work.    CT Abdomen was negative for acute findings (did note stable luncencies felt to be secondary to his multiple myeloma).   He is followed at [facility].  He was due to follow up with them in March, however this appointment was postponed due to COVID-19.  Review of Systems Past Medical History:  Diagnosis Date  . Elevated PSA 11/29/2017  . Fasting hyperglycemia    NORMAL A1c  . Hyperlipidemia   . Hypertension   . Kappa light chain myeloma ([place]) 02/02/2014  . Testosterone deficiency    Dr Hartley Barefoot     Social History   Socioeconomic History  . Marital status: Married    Spouse name: Not on file  . Number of children: 3  . Years of education: Not on file  . Highest education level: Not on file  Occupational History  . Occupation: Truck Education administrator: Scientist, physiological  Social Needs  . Financial resource strain: Not on file  . Food insecurity    Worry: Not on file    Inability: Not on file  . Transportation needs    Medical: Not on file    Non-medical: Not on file  Tobacco Use  . Smoking status: Never Smoker  . Smokeless tobacco: Never Used  . Tobacco comment: never used tobacco  Substance and Sexual Activity  . Alcohol use: Yes    Alcohol/week: 0.0 standard drinks    Comment:  rarely  . Drug use: No  . Sexual activity: Yes    Birth control/protection: Condom  Lifestyle  . Physical  activity    Days per week: Not on file    Minutes per session: Not on file  . Stress: Not on file  Relationships  . Social Herbalist on phone: Not on file    Gets together: Not on file    Attends religious service: Not on file    Active member of club or organization: Not on file    Attends meetings of clubs or organizations: Not on file    Relationship status: Not on file  . Intimate partner violence    Fear of current or ex partner: Not on file    Emotionally abused: Not on file    Physically abused: Not on file    Forced sexual activity: Not on file  Other Topics Concern  . 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 CERVICAL PLATE INSERTION     POST COMPRESSION DISC INJURY  . LIPOMAS  REMOVAL     BENIGN FROM CHEST AND BACK    Family History  Problem Relation Age of Onset  . Breast cancer Mother   . Heart attack Father 55  . Kidney disease Brother        RENAL FAILURE  . Hypertension Brother   . Heart attack Maternal Aunt 65       CABG  . Stroke Maternal Uncle        CVA  . Diabetes Neg Hx   . Colon cancer Neg Hx   . Esophageal cancer Neg Hx   . Rectal cancer Neg Hx   . Stomach cancer Neg Hx   . Colon polyps Neg Hx     No Known Allergies  Current Outpatient Medications on File Prior to Visit  Medication Sig Dispense Refill  . amLODipine (NORVASC) 5 MG tablet TAKE 1 TABLET (5 MG TOTAL) BY MOUTH DAILY. 90 tablet 1  . aspirin 325 MG EC tablet Take 325 mg by mouth daily.    . cephALEXin (KEFLEX) 500 MG capsule Take 2 capsules (1,000 mg total) by mouth 2 (two) times daily. 28 capsule 0  . cholecalciferol (VITAMIN D3) 25 MCG (1000 UT) tablet Take 1 tablet (1,000 Units total) by mouth daily.    . Lactulose 20  GM/30ML SOLN Take 30 mLs (20 g total) by mouth every 4 (four) hours as needed (Take as directed until bowel movement.). 236 mL 2  . lenalidomide (REVLIMID) 10 MG capsule Take one capsule by mouth daily for 21 days and then 7 days off. #[id number] 21 capsule 0  . omega-3 acid ethyl esters (LOVAZA) 1 g capsule TAKE 2 CAPSULES (2 G TOTAL) BY MOUTH 2 (TWO) TIMES DAILY. 60 capsule 5  . sildenafil (VIAGRA) 100 MG tablet Take 0.5-1 tablets (50-100 mg total) by mouth daily as needed for up to 30 days for erectile dysfunction. 12 tablet 11   Current Facility-Administered Medications on File Prior to Visit  Medication Dose Route Frequency Provider Last Rate Last Dose  . 0.9 %  sodium chloride infusion  500 mL Intravenous Once Irene Shipper, MD        BP 116/72 (BP Location: Right Arm, Patient Position: Sitting, Cuff Size: Large)   Pulse 62   Temp 98.4 F (36.9 C) (Oral)   Resp 16   Ht _0  (1.803 m)   Wt 199 lb (90.3 kg)   SpO2 100%   BMI 27.75 kg/m       Objective:   Physical Exam Constitutional:      General: He is not in acute distress.    Appearance: He is well-developed.  HENT:     Head: Normocephalic and atraumatic.  Cardiovascular:     Rate and Rhythm: Normal rate and regular rhythm.     Heart sounds: No murmur.  Pulmonary:     Effort: Pulmonary effort is normal. No respiratory distress.     Breath sounds: Normal breath sounds. No wheezing or rales.  Skin:    General: Skin is warm and dry.  Neurological:     Mental Status: He is alert and oriented to person, place, and time.  Psychiatric:        Behavior: Behavior normal.        Thought Content: Thought content normal.           Assessment & Plan:  UTI-clinically resolved.  History of elevated PSA.  This is being monitored by [facility].  I am concerned that he  is having some bladder outlet obstruction which is contributing to his urinary tract infections.  I have advised him to schedule follow-up  with urology.  I would also like for them to evaluate his heme hematuria.  Renal insufficiency- will refer to nephrology for consultation.

## 2019-05-31 NOTE — Patient Instructions (Signed)
Please contact [facility] to schedule a follow up visit. We will arrange a consult with the kidney doctor.

## 2019-06-19 MED FILL — TAMSULOSIN HCL 0.4 MG CAP: 0.4 | 30 days supply | Qty: 30 | Fill #0

## 2019-06-19 MED FILL — SILDENAFIL CITRATE 100 MG T: 100 | 17 days supply | Qty: 5 | Fill #3

## 2019-06-19 MED FILL — OMEGA-3 ETHYL ESTERS 1 GM C: 1 | 15 days supply | Qty: 60 | Fill #6

## 2019-06-23 ENCOUNTER — Other Ambulatory Visit: Payer: Self-pay | Admitting: *Deleted

## 2019-06-23 DIAGNOSIS — C9000 Multiple myeloma not having achieved remission: Secondary | ICD-10-CM

## 2019-06-23 MED ORDER — LENALIDOMIDE 10 MG PO CAPS: ORAL_CAPSULE | ORAL | 0 refills | Status: DC

## 2019-06-27 ENCOUNTER — Other Ambulatory Visit: Payer: Self-pay | Admitting: Hematology & Oncology

## 2019-06-27 DIAGNOSIS — C9000 Multiple myeloma not having achieved remission: Secondary | ICD-10-CM

## 2019-07-07 ENCOUNTER — Inpatient Hospital Stay: Payer: 59 | Attending: Hematology & Oncology

## 2019-07-07 ENCOUNTER — Ambulatory Visit: Payer: 59 | Admitting: Hematology & Oncology

## 2019-07-07 ENCOUNTER — Ambulatory Visit: Payer: 59

## 2019-07-07 ENCOUNTER — Other Ambulatory Visit: Payer: 59

## 2019-07-07 ENCOUNTER — Other Ambulatory Visit: Payer: Self-pay

## 2019-07-07 ENCOUNTER — Encounter: Payer: Self-pay | Admitting: Hematology & Oncology

## 2019-07-07 ENCOUNTER — Inpatient Hospital Stay: Payer: 59

## 2019-07-07 ENCOUNTER — Inpatient Hospital Stay (HOSPITAL_BASED_OUTPATIENT_CLINIC_OR_DEPARTMENT_OTHER): Payer: 59 | Admitting: Hematology & Oncology

## 2019-07-07 ENCOUNTER — Telehealth: Payer: Self-pay | Admitting: Hematology & Oncology

## 2019-07-07 VITALS — BP 131/82 | HR 51 | Temp 97.1°F | Resp 18 | Wt 202.0 lb

## 2019-07-07 DIAGNOSIS — Z9484 Stem cells transplant status: Secondary | ICD-10-CM | POA: Insufficient documentation

## 2019-07-07 DIAGNOSIS — C9000 Multiple myeloma not having achieved remission: Secondary | ICD-10-CM | POA: Insufficient documentation

## 2019-07-07 DIAGNOSIS — Z7982 Long term (current) use of aspirin: Secondary | ICD-10-CM | POA: Insufficient documentation

## 2019-07-07 DIAGNOSIS — Z79899 Other long term (current) drug therapy: Secondary | ICD-10-CM | POA: Diagnosis not present

## 2019-07-07 LAB — CBC WITH DIFFERENTIAL (CANCER CENTER ONLY)
Abs Immature Granulocytes: 0.02 10*3/uL (ref 0.00–0.07)
Basophils Absolute: 0.1 10*3/uL (ref 0.0–0.1)
Basophils Relative: 3 %
Eosinophils Absolute: 0.1 10*3/uL (ref 0.0–0.5)
Eosinophils Relative: 4 %
HCT: 41.5 % (ref 39.0–52.0)
Hemoglobin: 14.4 g/dL (ref 13.0–17.0)
Immature Granulocytes: 1 %
Lymphocytes Relative: 34 %
Lymphs Abs: 1.2 10*3/uL (ref 0.7–4.0)
MCH: 32.3 pg (ref 26.0–34.0)
MCHC: 34.7 g/dL (ref 30.0–36.0)
MCV: 93 fL (ref 80.0–100.0)
Monocytes Absolute: 0.4 10*3/uL (ref 0.1–1.0)
Monocytes Relative: 11 %
Neutro Abs: 1.7 10*3/uL (ref 1.7–7.7)
Neutrophils Relative %: 47 %
Platelet Count: 209 10*3/uL (ref 150–400)
RBC: 4.46 MIL/uL (ref 4.22–5.81)
RDW: 13.2 % (ref 11.5–15.5)
WBC Count: 3.6 10*3/uL — ABNORMAL LOW (ref 4.0–10.5)
nRBC: 0 % (ref 0.0–0.2)

## 2019-07-07 LAB — CMP (CANCER CENTER ONLY)
ALT: 17 U/L (ref 0–44)
AST: 16 U/L (ref 15–41)
Albumin: 4.3 g/dL (ref 3.5–5.0)
Alkaline Phosphatase: 36 U/L — ABNORMAL LOW (ref 38–126)
Anion gap: 8 (ref 5–15)
BUN: 13 mg/dL (ref 6–20)
CO2: 29 mmol/L (ref 22–32)
Calcium: 9.3 mg/dL (ref 8.9–10.3)
Chloride: 102 mmol/L (ref 98–111)
Creatinine: 1.27 mg/dL — ABNORMAL HIGH (ref 0.61–1.24)
GFR, Est AFR Am: >60 mL/min (ref >60)
GFR, Estimated: >60 mL/min (ref >60)
Glucose, Bld: 101 mg/dL — ABNORMAL HIGH (ref 70–99)
Potassium: 3.6 mmol/L (ref 3.5–5.1)
Sodium: 139 mmol/L (ref 135–145)
Total Bilirubin: 1 mg/dL (ref 0.3–1.2)
Total Protein: 6.9 g/dL (ref 6.5–8.1)

## 2019-07-07 MED ORDER — DENOSUMAB 120 MG/1.7ML ~~LOC~~ SOLN
SUBCUTANEOUS | Status: AC
  Filled 2019-07-07: qty 1.7

## 2019-07-07 NOTE — Telephone Encounter (Signed)
Appointments scheduled patient advised per 8/14 los

## 2019-07-07 NOTE — Progress Notes (Signed)
Hematology and Oncology Follow Up Visit  Jimmy Day [facility] [id number] 01-05-1961 58 y.o. 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  Unfortunately, he has to go to the dentist this morning.  He cracked a tooth.  He been off have a tooth.  He thinks he did this on a.  Thankfully, he did not get his Delton See today already.  His last Xgeva I think was back in May.  As such, I think will be okay for him to have the tooth fixed or pulled.  I will make sure he does not get Xgeva until next year.  He and his wife still are thinking about moving to [facility].  They actually went to [place], [place] for a little visit.  They enjoyed this.  He is still working.  She is still working.  He is on Revlimid.  He is having no problems with Revlimid.  His monoclonal spike was not detected with his last visit.  He had a urinary tract infection about 6 weeks ago.  This was with E. coli.  I am surprised that he had the infection.  Thankfully was cleared up with antibiotics.  Currently, his performance status is ECOG 0.     Medications:  Allergies as of 07/07/2019   No Known Allergies     Medication List       Accurate as of July 07, 2019  8:09 AM. If you have any questions, ask your nurse or doctor.        amLODipine 5 MG tablet Commonly known as: NORVASC TAKE 1 TABLET (5 MG TOTAL) BY MOUTH DAILY.   aspirin 325 MG EC tablet Take 325 mg by mouth daily.   cephALEXin 500 MG capsule Commonly known as: KEFLEX Take 2 capsules (1,000 mg total) by mouth 2 (two) times daily.   cholecalciferol 25 MCG (1000 UT) tablet Commonly known as: VITAMIN D3 Take 1 tablet (1,000 Units total) by mouth daily.   Lactulose 20 GM/30ML Soln Take 30 mLs (20 g total) by mouth  every 4 (four) hours as needed (Take as directed until bowel movement.).   lenalidomide 10 MG capsule Commonly known as: Revlimid TAKE 1 CAPSULE DAILY FOR 21 DAYS AND THEN 7 DAYS OFF   omega-3 acid ethyl esters 1 g capsule Commonly known as: LOVAZA TAKE 2 CAPSULES (2 G TOTAL) BY MOUTH 2 (TWO) TIMES DAILY.   sildenafil 100 MG tablet Commonly known as: Viagra Take 0.5-1 tablets (50-100 mg total) by mouth daily as needed for up to 30 days for erectile dysfunction.       Allergies: No Known Allergies  Past Medical History, Surgical history, Social history, and Family History were reviewed and updated.  Review of Systems: Review of Systems  Constitutional: Negative.   HENT: Negative.   Eyes: Negative.   Respiratory: Negative.   Cardiovascular: Negative.   Gastrointestinal: Negative.   Genitourinary: Negative.   Musculoskeletal: Negative.   Skin: Negative.   Neurological: Negative.   Endo/Heme/Allergies: Negative.   Psychiatric/Behavioral: Negative.      Physical Exam:  vitals were not taken for this visit.   Wt Readings from Last 3 Encounters:  05/31/19 199 lb (90.3 kg)  05/22/19 201 lb (91.2 kg)  05/09/19 206 lb (93.4 kg)  Physical Exam Vitals signs reviewed.  HENT:     Head: Normocephalic and atraumatic.  Eyes:     Pupils: Pupils are equal, round, and reactive to light.  Neck:     Musculoskeletal: Normal range of motion.  Cardiovascular:     Rate and Rhythm: Normal rate and regular rhythm.     Heart sounds: Normal heart sounds.  Pulmonary:     Effort: Pulmonary effort is normal.     Breath sounds: Normal breath sounds.  Abdominal:     General: Bowel sounds are normal.     Palpations: Abdomen is soft.  Musculoskeletal: Normal range of motion.        General: No tenderness or deformity.  Lymphadenopathy:     Cervical: No cervical adenopathy.  Skin:    General: Skin is warm and dry.     Findings: No erythema or rash.  Neurological:     Mental  Status: He is alert and oriented to person, place, and time.  Psychiatric:        Behavior: Behavior normal.        Thought Content: Thought content normal.        Judgment: Judgment normal.       Lab Results  Component Value Date   WBC 3.6 (L) 07/07/2019   HGB 14.4 07/07/2019   HCT 41.5 07/07/2019   MCV 93.0 07/07/2019   PLT 209 07/07/2019   Lab Results  Component Value Date   FERRITIN 152 08/17/2014   IRON 106 08/17/2014   TIBC 253 08/17/2014   UIBC 146 08/17/2014   IRONPCTSAT 42 08/17/2014   Lab Results  Component Value Date   RBC 4.46 07/07/2019   Lab Results  Component Value Date   KPAFRELGTCHN 18.4 04/07/2019   LAMBDASER 10.6 04/07/2019   KAPLAMBRATIO 1.74 (H) 04/07/2019   Lab Results  Component Value Date   IGGSERUM 1,901 (H) 04/07/2019   IGA 17 (L) 04/07/2019   IGMSERUM 15 (L) 04/07/2019   Lab Results  Component Value Date   TOTALPROTELP 7.4 04/07/2019   ALBUMINELP 3.9 01/05/2019   A1GS 0.2 01/05/2019   A2GS 0.4 01/05/2019   BETS 0.8 01/05/2019   BETA2SER 0.2 10/04/2015   GAMS 1.5 01/05/2019   MSPIKE Not Observed 01/05/2019   SPEI Comment 01/05/2019     Chemistry      Component Value Date/Time   NA 137 05/22/2019 1343   NA 139 08/13/2017 0918   NA 142 04/01/2015 0834   K 3.5 05/22/2019 1343   K 3.6 08/13/2017 0918   K 3.4 (L) 04/01/2015 0834   CL 102 05/22/2019 1343   CL 105 08/13/2017 0918   CO2 25 05/22/2019 1343   CO2 28 08/13/2017 0918   CO2 20 (L) 04/01/2015 0834   BUN 16 05/22/2019 1343   BUN 8 08/13/2017 0918   BUN 10.4 04/01/2015 0834   CREATININE 1.50 (H) 05/22/2019 1343   CREATININE 1.35 (H) 04/07/2019 0903   CREATININE 1.4 (H) 08/13/2017 0918   CREATININE 1.2 04/01/2015 0834      Component Value Date/Time   CALCIUM 8.7 (L) 05/22/2019 1343   CALCIUM 9.1 08/13/2017 0918   CALCIUM 8.6 04/01/2015 0834   ALKPHOS 38 05/22/2019 1343   ALKPHOS 42 08/13/2017 0918   ALKPHOS 39 (L) 04/01/2015 0834   AST 16 05/22/2019 1343    AST 17 04/07/2019 0903   AST 17 04/01/2015 0834   ALT 16 05/22/2019 1343   ALT 19 04/07/2019 0903   ALT 21 08/13/2017  0918   ALT 19 04/01/2015 0834   BILITOT 1.3 (H) 05/22/2019 1343   BILITOT 1.1 04/07/2019 0903   BILITOT 0.56 04/01/2015 0834      Impression and Plan: Jimmy Day is a pleasant 58 yo African American gentleman with IgG kappa myeloma.  He had induction therapy with RVD.  He then underwent s/p autologous stem cell transplant at [facility] in April 2016. He is doing well and is asymptomatic at this time.   Hopefully, he will still be in remission.  It is now been over 4 years since he had his transplant.  Again, we will have to hold off on his Xgeva.  I probably would not give another dose until next year.  I will plan to see him back in 6 months.  We will see about the Xgeva when we see him back.   [name], MD 8/14/20208:09 AM

## 2019-07-08 LAB — IGG, IGA, IGM
IgA: 18 mg/dL — ABNORMAL LOW (ref 90–386)
IgG (Immunoglobin G), Serum: 1681 mg/dL — ABNORMAL HIGH (ref 603–1613)
IgM (Immunoglobulin M), Srm: 17 mg/dL — ABNORMAL LOW (ref 20–172)

## 2019-07-10 LAB — PROTEIN ELECTROPHORESIS, SERUM, WITH REFLEX
A/G Ratio: 1.3 (ref 0.7–1.7)
Albumin ELP: 4 g/dL (ref 2.9–4.4)
Alpha-1-Globulin: 0.2 g/dL (ref 0.0–0.4)
Alpha-2-Globulin: 0.4 g/dL (ref 0.4–1.0)
Beta Globulin: 0.9 g/dL (ref 0.7–1.3)
Gamma Globulin: 1.6 g/dL (ref 0.4–1.8)
Globulin, Total: 3 g/dL (ref 2.2–3.9)
Total Protein ELP: 7 g/dL (ref 6.0–8.5)

## 2019-07-10 LAB — KAPPA/LAMBDA LIGHT CHAINS
Kappa free light chain: 24.1 mg/L — ABNORMAL HIGH (ref 3.3–19.4)
Kappa, lambda light chain ratio: 1.99 — ABNORMAL HIGH (ref 0.26–1.65)
Lambda free light chains: 12.1 mg/L (ref 5.7–26.3)

## 2019-07-17 ENCOUNTER — Telehealth: Payer: Self-pay | Admitting: Hematology & Oncology

## 2019-07-17 NOTE — Telephone Encounter (Signed)
Faxed medical records to: Jacklynn Bue, RN, [place] P: [phone] x [phone] F: [phone]   for  Vidyuth Lagares 1961-09-25 CASE: [id number]    COPY SCANNED

## 2019-07-24 ENCOUNTER — Other Ambulatory Visit: Payer: Self-pay | Admitting: *Deleted

## 2019-07-24 DIAGNOSIS — C9000 Multiple myeloma not having achieved remission: Secondary | ICD-10-CM

## 2019-07-24 MED ORDER — LENALIDOMIDE 10 MG PO CAPS: ORAL_CAPSULE | ORAL | 0 refills | Status: DC

## 2019-07-24 MED FILL — SILDENAFIL CITRATE 100 MG T: 100 | 17 days supply | Qty: 5 | Fill #4

## 2019-07-24 MED FILL — OMEGA-3 ETHYL ESTERS 1 GM C: 1 | 5 days supply | Qty: 20 | Fill #7

## 2019-07-24 MED FILL — TAMSULOSIN HCL 0.4 MG CAP: 0.4 | 30 days supply | Qty: 30 | Fill #1

## 2019-08-09 ENCOUNTER — Ambulatory Visit (INDEPENDENT_AMBULATORY_CARE_PROVIDER_SITE_OTHER): Payer: 59 | Admitting: Family

## 2019-08-09 ENCOUNTER — Telehealth: Payer: Self-pay | Admitting: Family

## 2019-08-09 ENCOUNTER — Other Ambulatory Visit: Payer: Self-pay

## 2019-08-09 ENCOUNTER — Encounter: Payer: Self-pay | Admitting: Family

## 2019-08-09 VITALS — BP 140/80 | HR 54 | Temp 96.9°F | Resp 16 | Ht 71.0 in | Wt 203.0 lb

## 2019-08-09 DIAGNOSIS — C9000 Multiple myeloma not having achieved remission: Secondary | ICD-10-CM

## 2019-08-09 DIAGNOSIS — Z1159 Encounter for screening for other viral diseases: Secondary | ICD-10-CM

## 2019-08-09 DIAGNOSIS — Z23 Encounter for immunization: Secondary | ICD-10-CM

## 2019-08-09 DIAGNOSIS — I10 Essential (primary) hypertension: Secondary | ICD-10-CM

## 2019-08-09 DIAGNOSIS — E785 Hyperlipidemia, unspecified: Secondary | ICD-10-CM | POA: Diagnosis not present

## 2019-08-09 DIAGNOSIS — Z0000 Encounter for general adult medical examination without abnormal findings: Secondary | ICD-10-CM

## 2019-08-09 DIAGNOSIS — K5900 Constipation, unspecified: Secondary | ICD-10-CM

## 2019-08-09 DIAGNOSIS — Z114 Encounter for screening for human immunodeficiency virus [HIV]: Secondary | ICD-10-CM

## 2019-08-09 DIAGNOSIS — Z125 Encounter for screening for malignant neoplasm of prostate: Secondary | ICD-10-CM

## 2019-08-09 DIAGNOSIS — R3129 Other microscopic hematuria: Secondary | ICD-10-CM | POA: Diagnosis not present

## 2019-08-09 LAB — URINALYSIS, ROUTINE W REFLEX MICROSCOPIC
Bilirubin Urine: NEGATIVE
Ketones, ur: NEGATIVE
Leukocytes,Ua: NEGATIVE
Nitrite: NEGATIVE
Specific Gravity, Urine: 1.015 (ref 1.000–1.030)
Total Protein, Urine: NEGATIVE
Urine Glucose: NEGATIVE
Urobilinogen, UA: 0.2 (ref 0.0–1.0)
pH: 6.5 (ref 5.0–8.0)

## 2019-08-09 LAB — PSA: PSA: 4.9 ng/mL — ABNORMAL HIGH (ref 0.10–4.00)

## 2019-08-09 LAB — LIPID PANEL
Cholesterol: 153 mg/dL (ref 0–200)
HDL: 37.9 mg/dL — ABNORMAL LOW (ref >39.00)
LDL Cholesterol: 88 mg/dL (ref 0–99)
NonHDL: 115.29
Total CHOL/HDL Ratio: 4
Triglycerides: 134 mg/dL (ref 0.0–149.0)
VLDL: 26.8 mg/dL (ref 0.0–40.0)

## 2019-08-09 LAB — TSH: TSH: 1.54 u[IU]/mL (ref 0.35–4.50)

## 2019-08-09 MED FILL — SILDENAFIL CITRATE 100 MG T: 100 | 17 days supply | Qty: 5 | Fill #5

## 2019-08-09 NOTE — Patient Instructions (Signed)
Continue healthy diet and exercise. Complete lab work prior to leaving.

## 2019-08-09 NOTE — Telephone Encounter (Signed)
Please advise pt that PSA remains elevated.  Has he been back to see urology for this in the last 6 months? If not, please schedule him a follow up with urology, dx elevated PSA.  Cholesterol, thyroid and urine all look good.

## 2019-08-09 NOTE — Telephone Encounter (Signed)
Advised patient of results, he did see Dr. Loletha Grayer. Lovena Neighbours last month at [facility] urology. Labs forwarded to Dr. Lovena Neighbours.

## 2019-08-09 NOTE — Progress Notes (Signed)
Subjective:    Patient ID: Jimmy Day, male    DOB: 03/25/1961, 58 y.o.   MRN: [id number]  HPI  Patient presents today for complete physical.  Immunizations: due for tdap, flu shot Diet:  fair Exercise: regular (stationary bike, jump rope)  Colonoscopy: 2/19 Vision: 2019 Dental:  Up to date Wt Readings from Last 3 Encounters:  08/09/19 203 lb (92.1 kg)  07/07/19 202 lb (91.6 kg)  05/31/19 199 lb (90.3 kg)     Review of Systems  Constitutional: Negative for unexpected weight change.  HENT: Negative for hearing loss and rhinorrhea.   Eyes: Negative for visual disturbance.  Respiratory: Negative for cough and shortness of breath.   Cardiovascular: Negative for chest pain and leg swelling.  Gastrointestinal: Positive for constipation (occasional). Negative for diarrhea.  Genitourinary: Negative for difficulty urinating, dysuria and frequency.  Musculoskeletal: Negative for arthralgias and myalgias.  Skin: Negative for rash.  Neurological: Negative for headaches.  Hematological: Negative for adenopathy.  Psychiatric/Behavioral:       Denies anxiety or depression   Past Medical History:  Diagnosis Date  . Elevated PSA 11/29/2017  . Fasting hyperglycemia    NORMAL A1c  . Hyperlipidemia   . Hypertension   . 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 Testosterone deficiency    Dr Hartley Barefoot     Social History   Socioeconomic History  . Marital status: Married    Spouse name: Not on file  . Number of children: 3  . Years of education: Not on file  . Highest education level: Not on file  Occupational History  . Occupation: Truck Education administrator: Scientist, physiological  Social Needs  . Financial resource strain: Not on file  . Food insecurity    Worry: Not on file    Inability: Not on file  . Transportation needs    Medical: Not on file    Non-medical: Not on file  Tobacco Use  . Smoking status: Never Smoker  . Smokeless tobacco: Never Used  . Tobacco  comment: never used tobacco  Substance and Sexual Activity  . Alcohol use: Yes    Alcohol/week: 0.0 standard drinks    Comment:  rarely  . Drug use: No  . Sexual activity: Yes    Birth control/protection: Condom  Lifestyle  . Physical activity    Days per week: Not on file    Minutes per session: Not on file  . Stress: Not on file  Relationships  . Social Herbalist on phone: Not on file    Gets together: Not on file    Attends religious service: Not on file    Active member of club or organization: Not on file    Attends meetings of clubs or organizations: Not on file    Relationship status: Not on file  . Intimate partner violence    Fear of current or ex partner: Not on file    Emotionally abused: Not on file    Physically abused: Not on file    Forced sexual activity: Not on file  Other Topics Concern  . 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 CERVICAL PLATE INSERTION  POST COMPRESSION DISC INJURY  . LIPOMAS REMOVAL     BENIGN FROM CHEST AND BACK    Family History  Problem Relation Age of Onset  . Breast cancer Mother   . Heart attack Father 32  . Kidney disease Brother        RENAL FAILURE  . Hypertension Brother   . Heart attack Maternal Aunt 65       CABG  . Stroke Maternal Uncle        CVA  . Diabetes Neg Hx   . Colon cancer Neg Hx   . Esophageal cancer Neg Hx   . Rectal cancer Neg Hx   . Stomach cancer Neg Hx   . Colon polyps Neg Hx     No Known Allergies  Current Outpatient Medications on File Prior to Visit  Medication Sig Dispense Refill  . amLODipine (NORVASC) 5 MG tablet TAKE 1 TABLET (5 MG TOTAL) BY MOUTH DAILY. 90 tablet 1  . aspirin 325 MG EC tablet Take 325 mg by mouth daily.     . cephALEXin (KEFLEX) 500 MG capsule Take 2 capsules (1,000 mg total) by mouth 2 (two) times daily. 28 capsule 0  . cholecalciferol (VITAMIN D3) 25 MCG (1000 UT) tablet Take 1 tablet (1,000 Units total) by mouth daily.    . Lactulose 20 GM/30ML SOLN Take 30 mLs (20 g total) by mouth every 4 (four) hours as needed (Take as directed until bowel movement.). 236 mL 2  . lenalidomide (REVLIMID) 10 MG capsule TAKE 1 CAPSULE DAILY FOR 21 DAYS AND THEN 7 DAYS [id number] 21 capsule 0  . omega-3 acid ethyl esters (LOVAZA) 1 g capsule TAKE 2 CAPSULES (2 G TOTAL) BY MOUTH 2 (TWO) TIMES DAILY. 60 capsule 5  . sildenafil (VIAGRA) 100 MG tablet Take 0.5-1 tablets (50-100 mg total) by mouth daily as needed for up to 30 days for erectile dysfunction. 12 tablet 11   Current Facility-Administered Medications on File Prior to Visit  Medication Dose Route Frequency Provider Last Rate Last Dose  . 0.9 %  sodium chloride infusion  500 mL Intravenous Once Irene Shipper, MD        BP 140/80 (BP Location: Right Arm, Patient Position: Sitting, Cuff Size: Normal)   Pulse (!) 54   Temp (!) 96.9 F (36.1 C) (Temporal)   Resp 16   Ht _0  (1.803 m)   Wt 203 lb (92.1 kg)   SpO2 98%   BMI 28.31 kg/m       Objective:   Physical Exam  Physical Exam  Constitutional: He is oriented to person, place, and time. He appears well-developed and well-nourished. No distress.  HENT:  Head: Normocephalic and atraumatic.  Right Ear: Tympanic membrane and ear canal normal.  Left Ear: Tympanic membrane and ear canal normal.  Mouth/Throat: Oropharynx is clear and moist.  Eyes: Pupils are equal, round, and reactive to light. No scleral icterus.  Neck: Normal range of motion. No thyromegaly present.  Cardiovascular: Normal rate and regular rhythm.   No murmur heard. Pulmonary/Chest: Effort normal and breath sounds normal. No respiratory distress. He has no wheezes. He has no rales. He exhibits no tenderness.  Abdominal:  Soft. Bowel sounds are normal. He exhibits no distension and no mass. There is no tenderness. There is no rebound and no guarding.  Musculoskeletal: He exhibits no edema.  Lymphadenopathy:    He has no cervical adenopathy.  Neurological: He is alert and oriented to person, place, and time.  He has normal patellar reflexes. He exhibits normal muscle tone. Coordination normal.  Skin: Skin is warm and dry.  Psychiatric: He has a normal mood and affect. His behavior is normal. Judgment and thought content normal.           Assessment & Plan:   Preventative care- advised pt to continue healthy diet exercise. Tdap and flu shot today. Obtain routine lab work including PSA, HIV screen Hep C screen.  Colonoscopy up to date. He did have some microscopic hematuria while in the Ed for a UTI this past summer- will recheck urinalysis with micro.   HTN- bp is acceptable. Continue current medications.   Multiple Myeloma- following with hematology. Maintained on Xgeva and is stable. Defer management to heme/onc  Hyperlipidemia- maintained on lovaza- continue same.  Constipation-reports to produce intake and water intake. Takes a probiotic. Recommended that he try adding metamucil wafers.      Assessment & Plan:

## 2019-08-10 LAB — HIV ANTIBODY (ROUTINE TESTING W REFLEX): HIV 1&2 Ab, 4th Generation: NONREACTIVE

## 2019-08-10 LAB — HEPATITIS C ANTIBODY
Hepatitis C Ab: NONREACTIVE
SIGNAL TO CUT-OFF: 0.05 (ref <1.00)

## 2019-08-22 ENCOUNTER — Other Ambulatory Visit: Payer: Self-pay | Admitting: *Deleted

## 2019-08-22 DIAGNOSIS — C9000 Multiple myeloma not having achieved remission: Secondary | ICD-10-CM

## 2019-08-22 MED ORDER — LENALIDOMIDE 10 MG PO CAPS: ORAL_CAPSULE | ORAL | 0 refills | Status: DC

## 2019-08-24 ENCOUNTER — Telehealth: Payer: Self-pay

## 2019-08-24 MED FILL — OMEGA-3 ETHYL ESTERS 1 GM C: 1 | 3 days supply | Qty: 15 | Fill #8

## 2019-08-24 MED FILL — AMLODIPINE BESYLATE 5 MG TA: 5 | 90 days supply | Qty: 90 | Fill #1

## 2019-08-24 MED FILL — TAMSULOSIN HCL 0.4 MG CAP: 0.4 | 30 days supply | Qty: 30 | Fill #2

## 2019-08-24 NOTE — Telephone Encounter (Signed)
Patient has not received a called from [facility] for appointment, he is already established with them, his last psa was faxed to them. I advised patient to give them a call for appointment.

## 2019-08-24 NOTE — Telephone Encounter (Signed)
Copied from [place] [id number]. Topic: General - Call Back - No Documentation >> Aug 24, 2019  1:19 PM Erick Blinks wrote: Reason for CRM: Call back request from Nurse  Best contact: [phone]

## 2019-08-25 ENCOUNTER — Other Ambulatory Visit: Payer: Self-pay | Admitting: Hematology & Oncology

## 2019-08-25 DIAGNOSIS — C9000 Multiple myeloma not having achieved remission: Secondary | ICD-10-CM

## 2019-08-26 ENCOUNTER — Other Ambulatory Visit: Payer: Self-pay | Admitting: Hematology & Oncology

## 2019-08-26 DIAGNOSIS — C9000 Multiple myeloma not having achieved remission: Secondary | ICD-10-CM

## 2019-09-26 MED FILL — TAMSULOSIN HCL 0.4 MG CAP: 0.4 | 30 days supply | Qty: 30 | Fill #3

## 2019-09-26 MED FILL — SILDENAFIL CITRATE 100 MG T: 100 | 17 days supply | Qty: 5 | Fill #6

## 2019-09-26 MED FILL — OMEGA-3-ACID ETHYL ESTERS 1: 1 | 3 days supply | Qty: 15 | Fill #9

## 2019-10-04 ENCOUNTER — Other Ambulatory Visit: Payer: Self-pay | Admitting: Hematology & Oncology

## 2019-10-04 ENCOUNTER — Other Ambulatory Visit: Payer: Self-pay | Admitting: *Deleted

## 2019-10-04 DIAGNOSIS — C9000 Multiple myeloma not having achieved remission: Secondary | ICD-10-CM

## 2019-10-04 MED ORDER — LENALIDOMIDE 10 MG PO CAPS: ORAL_CAPSULE | ORAL | 0 refills | Status: DC

## 2019-10-05 ENCOUNTER — Other Ambulatory Visit: Payer: Self-pay | Admitting: Hematology & Oncology

## 2019-10-05 DIAGNOSIS — C9000 Multiple myeloma not having achieved remission: Secondary | ICD-10-CM

## 2019-11-09 ENCOUNTER — Other Ambulatory Visit: Payer: Self-pay | Admitting: Family

## 2019-11-09 MED FILL — SILDENAFIL CITRATE 100 MG T: 100 | 17 days supply | Qty: 5 | Fill #7

## 2019-11-09 MED FILL — TAMSULOSIN HCL 0.4 MG CAP: 0.4 | 30 days supply | Qty: 30 | Fill #4

## 2019-11-09 MED FILL — OMEGA-3-ACID ETHYL ESTERS 1: 1 | 3 days supply | Qty: 15 | Fill #10

## 2019-11-10 MED FILL — AMLODIPINE BESYLATE 5 MG TA: 5 | 90 days supply | Qty: 90 | Fill #0

## 2019-11-10 NOTE — Telephone Encounter (Signed)
Last OV 08/09/19 Last refill 05/10/19 #90/1 Next OV 02/06/19

## 2019-11-14 ENCOUNTER — Other Ambulatory Visit: Payer: Self-pay | Admitting: Hematology & Oncology

## 2019-11-14 DIAGNOSIS — C9000 Multiple myeloma not having achieved remission: Secondary | ICD-10-CM

## 2019-11-15 ENCOUNTER — Other Ambulatory Visit: Payer: Self-pay | Admitting: Hematology & Oncology

## 2019-11-15 DIAGNOSIS — C9000 Multiple myeloma not having achieved remission: Secondary | ICD-10-CM

## 2019-11-21 MED FILL — SILDENAFIL CITRATE 100 MG T: 100 | 17 days supply | Qty: 5 | Fill #8

## 2019-11-21 MED FILL — TAMSULOSIN HCL 0.4 MG CAP: 0.4 | 30 days supply | Qty: 30 | Fill #5

## 2019-11-21 MED FILL — AMLODIPINE BESYLATE 5 MG TA: 5 | 90 days supply | Qty: 90 | Fill #0

## 2019-11-21 MED FILL — OMEGA-3-ACID ETHYL ESTERS 1: 1 | 3 days supply | Qty: 15 | Fill #11

## 2019-12-04 ENCOUNTER — Other Ambulatory Visit: Payer: Self-pay | Admitting: *Deleted

## 2019-12-04 DIAGNOSIS — C9000 Multiple myeloma not having achieved remission: Secondary | ICD-10-CM

## 2019-12-04 MED ORDER — LENALIDOMIDE 10 MG PO CAPS: ORAL_CAPSULE | ORAL | 0 refills | Status: DC

## 2019-12-24 ENCOUNTER — Other Ambulatory Visit: Payer: Self-pay | Admitting: Unknown Physician Specialty

## 2019-12-24 ENCOUNTER — Encounter (HOSPITAL_COMMUNITY): Payer: Self-pay | Admitting: Emergency Medicine

## 2019-12-24 ENCOUNTER — Emergency Department (HOSPITAL_COMMUNITY)
Admission: EM | Admit: 2019-12-24 | Discharge: 2019-12-24 | Disposition: A | Discharge status: Home / Self Care | Payer: 59 | Attending: Emergency Medicine | Admitting: Emergency Medicine

## 2019-12-24 ENCOUNTER — Emergency Department (HOSPITAL_COMMUNITY): Payer: 59

## 2019-12-24 ENCOUNTER — Telehealth: Payer: Self-pay | Admitting: Unknown Physician Specialty

## 2019-12-24 ENCOUNTER — Other Ambulatory Visit: Payer: Self-pay

## 2019-12-24 DIAGNOSIS — I10 Essential (primary) hypertension: Secondary | ICD-10-CM

## 2019-12-24 DIAGNOSIS — J1282 Pneumonia due to coronavirus disease 2019: Secondary | ICD-10-CM | POA: Insufficient documentation

## 2019-12-24 DIAGNOSIS — Z79899 Other long term (current) drug therapy: Secondary | ICD-10-CM | POA: Diagnosis not present

## 2019-12-24 DIAGNOSIS — U071 COVID-19: Secondary | ICD-10-CM | POA: Insufficient documentation

## 2019-12-24 DIAGNOSIS — Z7982 Long term (current) use of aspirin: Secondary | ICD-10-CM | POA: Insufficient documentation

## 2019-12-24 DIAGNOSIS — R509 Fever, unspecified: Secondary | ICD-10-CM | POA: Diagnosis present

## 2019-12-24 LAB — CBC WITH DIFFERENTIAL/PLATELET
Abs Immature Granulocytes: 0.03 10*3/uL (ref 0.00–0.07)
Basophils Absolute: 0 10*3/uL (ref 0.0–0.1)
Basophils Relative: 0 %
Eosinophils Absolute: 0 10*3/uL (ref 0.0–0.5)
Eosinophils Relative: 0 %
HCT: 40.9 % (ref 39.0–52.0)
Hemoglobin: 13.9 g/dL (ref 13.0–17.0)
Immature Granulocytes: 1 %
Lymphocytes Relative: 11 %
Lymphs Abs: 0.6 10*3/uL — ABNORMAL LOW (ref 0.7–4.0)
MCH: 31.8 pg (ref 26.0–34.0)
MCHC: 34 g/dL (ref 30.0–36.0)
MCV: 93.6 fL (ref 80.0–100.0)
Monocytes Absolute: 0.3 10*3/uL (ref 0.1–1.0)
Monocytes Relative: 5 %
Neutro Abs: 4.2 10*3/uL (ref 1.7–7.7)
Neutrophils Relative %: 83 %
Platelets: 172 10*3/uL (ref 150–400)
RBC: 4.37 MIL/uL (ref 4.22–5.81)
RDW: 13.2 % (ref 11.5–15.5)
WBC: 5.1 10*3/uL (ref 4.0–10.5)
nRBC: 0 % (ref 0.0–0.2)

## 2019-12-24 LAB — BASIC METABOLIC PANEL
Anion gap: 12 (ref 5–15)
BUN: 9 mg/dL (ref 6–20)
CO2: 26 mmol/L (ref 22–32)
Calcium: 8.4 mg/dL — ABNORMAL LOW (ref 8.9–10.3)
Chloride: 98 mmol/L (ref 98–111)
Creatinine, Ser: 1.57 mg/dL — ABNORMAL HIGH (ref 0.61–1.24)
GFR calc Af Amer: 55 mL/min — ABNORMAL LOW (ref >60)
GFR calc non Af Amer: 48 mL/min — ABNORMAL LOW (ref >60)
Glucose, Bld: 118 mg/dL — ABNORMAL HIGH (ref 70–99)
Potassium: 3 mmol/L — ABNORMAL LOW (ref 3.5–5.1)
Sodium: 136 mmol/L (ref 135–145)

## 2019-12-24 LAB — POC SARS CORONAVIRUS 2 AG -  ED: SARS Coronavirus 2 Ag: POSITIVE — AB

## 2019-12-24 MED ORDER — POTASSIUM CHLORIDE CRYS ER 20 MEQ PO TBCR
40.0000 meq | EXTENDED_RELEASE_TABLET | Freq: Once | ORAL | Status: AC
  Administered 2019-12-24: 08:00:00 40 meq via ORAL
  Filled 2019-12-24: qty 2

## 2019-12-24 MED ORDER — ACETAMINOPHEN 500 MG PO TABS: 500.0000 mg | ORAL_TABLET | Freq: Four times a day (QID) | ORAL | 0 refills | Status: DC | PRN

## 2019-12-24 MED ORDER — ACETAMINOPHEN 500 MG PO TABS
1000.0000 mg | ORAL_TABLET | Freq: Once | ORAL | Status: AC
  Administered 2019-12-24: 08:00:00 1000 mg via ORAL
  Filled 2019-12-24: qty 2

## 2019-12-24 MED ORDER — LORAZEPAM 2 MG/ML IJ SOLN: 1.0000 mg | INTRAMUSCULAR | Status: DC | PRN

## 2019-12-24 MED ORDER — BENZONATATE 100 MG PO CAPS: 100.0000 mg | ORAL_CAPSULE | Freq: Three times a day (TID) | ORAL | 0 refills | Status: DC

## 2019-12-24 MED ORDER — ALBUTEROL SULFATE HFA 108 (90 BASE) MCG/ACT IN AERS
2.0000 | INHALATION_SPRAY | Freq: Once | RESPIRATORY_TRACT | Status: AC
  Administered 2019-12-24: 08:00:00 2 via RESPIRATORY_TRACT
  Filled 2019-12-24: qty 6.7

## 2019-12-24 NOTE — ED Notes (Signed)
Blue top was sent down to main lab along with the CBC and BMP.

## 2019-12-24 NOTE — Telephone Encounter (Signed)
  I connected by phone with Jimmy Day on 12/24/2019 at 12:07 PM to discuss the potential use of an new treatment for mild to moderate COVID-19 viral infection in non-hospitalized patients.  This patient is a 59 y.o. male that meets the FDA criteria for Emergency Use Authorization of bamlanivimab or casirivimab\imdevimab.  Has a (+) direct SARS-CoV-2 viral test result  Has mild or moderate COVID-19   Is ? 59 years of age and weighs ? 40 kg  Is NOT hospitalized due to COVID-19  Is NOT requiring oxygen therapy or requiring an increase in baseline oxygen flow rate due to COVID-19  Is within 10 days of symptom onset  Has at least one of the high risk factor(s) for progression to severe COVID-19 and/or hospitalization as defined in EUA.  Specific high risk criteria : Hypertension   I have spoken and communicated the following to the patient or parent/caregiver:  1. FDA has authorized the emergency use of bamlanivimab and casirivimab\imdevimab for the treatment of mild to moderate COVID-19 in adults and pediatric patients with positive results of direct SARS-CoV-2 viral testing who are 74 years of age and older weighing at least 40 kg, and who are at high risk for progressing to severe COVID-19 and/or hospitalization.  2. The significant known and potential risks and benefits of bamlanivimab and casirivimab\imdevimab, and the extent to which such potential risks and benefits are unknown.  3. Information on available alternative treatments and the risks and benefits of those alternatives, including clinical trials.  4. Patients treated with bamlanivimab and casirivimab\imdevimab should continue to self-isolate and use infection control measures (e.g., wear mask, isolate, social distance, avoid sharing personal items, clean and disinfect "high touch" surfaces, and frequent handwashing) according to CDC guidelines.   5. The patient or parent/caregiver has the option to accept or refuse  bamlanivimab or casirivimab\imdevimab .  After reviewing this information with the patient, The patient agreed to proceed with receiving the casirivimab\imdevimab infusion and will be provided a copy of the Fact sheet prior to receiving the infusion.Kathrine Haddock 12/24/2019 12:07 PM  Pt was sent home from the ER.  No O2 but treated for pneumonia.  Sx onset 1/26

## 2019-12-24 NOTE — ED Notes (Signed)
Pt SpO2 96-97% while ambulating in room

## 2019-12-24 NOTE — ED Provider Notes (Signed)
59 yo male wife with covid , covid symptoms for 5 days presents with increasing dyspnea.  Sats 95% here and patient ambulated with maintaining sats. MAB clinic being consulted for op treatment. Discussed op follow up and return precautions.  He voices understanding.    Pattricia Boss, MD 12/24/19 [phone]

## 2019-12-24 NOTE — ED Notes (Signed)
Wife updated on pt's condition. Requests updates as available  Jimmy Day

## 2019-12-24 NOTE — Discharge Instructions (Addendum)
You have been diagnosed with Covid Pneumonia.  Our Monoclonal Antibody infusion clinic will reach out to you to schedule additional treatment.  Stay hydrated, get plenty of rest.  Take medications prescribed.  Return if your symptoms worsen or if you have any concerns. Use albuterol inhaler 2-4 puffs every 4 hours as needed for shortness of breath.

## 2019-12-24 NOTE — ED Triage Notes (Signed)
Patient reports fever with chills , SOB , and fatigue onset last night , he was exposed to her spouse who is positive for Covid19.

## 2019-12-24 NOTE — ED Notes (Signed)
Patient given discharge instructions patient verbalizes understanding. 

## 2019-12-24 NOTE — ED Provider Notes (Signed)
[facility] EMERGENCY DEPARTMENT Provider Note   CSN: [id number] Arrival date & time: 12/24/19  [id number]     History Chief Complaint  Patient presents with  . Exposure to Covid+ Spouse/ Fever with chills/ SOB/ Cough/Fat    Jimmy Day is a 59 y.o. male.  The history is provided by the patient. No language interpreter was used.     59 year old male with history of myeloma, hyperlipidemia, hypertension, presenting to the ED with cold symptoms.  Patient report his spouse currently have COVID-19.  He reports for the past 5 days he has had mild headache, body aches, chills, now fever, decreased in appetite, generalized fatigue and overall not feeling well.  States that wife tested positive for COVID-19 a week ago but the symptom is improving.  However, he endorsed gradual worsening shortness of breath since yesterday.  Feels he cannot catch a full breath.  He tries to take TheraFlu as well as other cough and cold medication at home without adequate relief.  He denies tobacco use.  Denies any loss of taste or smell, does endorse diarrhea but no nausea or vomiting.  Denies any dysuria.  Past Medical History:  Diagnosis Date  . Elevated PSA 11/29/2017  . Fasting hyperglycemia    NORMAL A1c  . Hyperlipidemia   . Hypertension   . Kappa light chain myeloma ([place]) 02/02/2014  . Testosterone deficiency    Dr Hartley Barefoot    Patient Active Problem List   Diagnosis Date Noted  . Elevated PSA 11/29/2017  . B12 deficiency 08/13/2017  . 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 Erectile dysfunction 12/31/2014  . 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 Vitamin D deficiency 10/01/2014  . 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 Abnormal findings on diagnostic imaging of spine 03/03/2013  . Hypogonadism in male 04/08/2009  . Hyperlipidemia 04/08/2009  . Fasting hyperglycemia 04/08/2009  .  NONSPECIFIC ABNORMAL ELECTROCARDIOGRAM 04/08/2009  . HTN (hypertension) 04/08/2009    Past Surgical History:  Procedure Laterality Date  . CERVICAL PLATE INSERTION     POST COMPRESSION DISC INJURY  . LIPOMAS REMOVAL     BENIGN FROM CHEST AND BACK       Family History  Problem Relation Age of Onset  . Breast cancer Mother   . Heart attack Father 83  . Kidney disease Brother        RENAL FAILURE  . Hypertension Brother        (died from sepsis at 13)  . Heart attack Maternal Aunt 65       CABG  . Stroke Maternal Uncle        CVA  . Heart attack Paternal Grandmother   . Diabetes Neg Hx   . Colon cancer Neg Hx   . Esophageal cancer Neg Hx   . Rectal cancer Neg Hx   . Stomach cancer Neg Hx   . Colon polyps Neg Hx     Social History   Tobacco Use  . Smoking status: Never Smoker  . Smokeless tobacco: Never Used  . Tobacco comment: never used tobacco  Substance Use Topics  . Alcohol use: Yes    Alcohol/week: 0.0 standard drinks    Comment:  rarely  . Drug use: No    Home Medications Prior to Admission medications   Medication Sig Start Date End Date Taking? Authorizing Provider  amLODipine (NORVASC) 5 MG tablet  TAKE 1 TABLET (5 MG TOTAL) BY MOUTH DAILY. 11/10/19   [name], NP  aspirin 325 MG EC tablet Take 325 mg by mouth daily.    [provider]  cephALEXin (KEFLEX) 500 MG capsule Take 2 capsules (1,000 mg total) by mouth 2 (two) times daily. 05/22/19   Lawyer, Harrell Gave, PA-C  cholecalciferol (VITAMIN D3) 25 MCG (1000 UT) tablet Take 1 tablet (1,000 Units total) by mouth daily. 11/28/18   [name], NP  Lactulose 20 GM/30ML SOLN Take 30 mLs (20 g total) by mouth every 4 (four) hours as needed (Take as directed until bowel movement.). 12/06/17   [name], MD  lenalidomide (REVLIMID) 10 MG capsule TAKE 1 CAPSULE DAILY FOR 21 DAYS AND THEN 7 DAYS OFF [id number] 12/04/19   [name], MD  omega-3 acid ethyl esters (LOVAZA) 1  g capsule TAKE 2 CAPSULES (2 G TOTAL) BY MOUTH 2 (TWO) TIMES DAILY. 12/23/18   [name], NP  sildenafil (VIAGRA) 100 MG tablet Take 0.5-1 tablets (50-100 mg total) by mouth daily as needed for up to 30 days for erectile dysfunction. 02/16/19 03/18/19  [name], NP    Allergies    Patient has no known allergies.  Review of Systems   Review of Systems  All other systems reviewed and are negative.   Physical Exam Updated Vital Signs BP 129/86 (BP Location: Right Arm)   Pulse (!) 104   Temp (!) 102 F (38.9 C) (Oral)   Resp 19   SpO2 94%   Physical Exam Vitals and nursing note reviewed.  Constitutional:      General: He is not in acute distress.    Appearance: He is well-developed.  HENT:     Head: Atraumatic.  Eyes:     Conjunctiva/sclera: Conjunctivae normal.  Cardiovascular:     Rate and Rhythm: Tachycardia present.     Pulses: Normal pulses.     Heart sounds: Normal heart sounds.  Pulmonary:     Breath sounds: Rales (Decreased breath sounds in the right lung, faint crackles heard at the left lower lung base.) present. No wheezing or rhonchi.  Abdominal:     Palpations: Abdomen is soft.     Tenderness: There is no abdominal tenderness.  Musculoskeletal:     Cervical back: Neck supple.     Comments: Globally weak but appears to move all 4 extremities with equal strength.  Skin:    Findings: No rash.  Neurological:     Mental Status: He is alert and oriented to person, place, and time.  Psychiatric:        Mood and Affect: Mood normal.     ED Results / Procedures / Treatments   Labs (all labs ordered are listed, but only abnormal results are displayed) Labs Reviewed  CBC WITH DIFFERENTIAL/PLATELET - Abnormal; Notable for the following components:      Result Value   Lymphs Abs 0.6 (*)    All other components within normal limits  BASIC METABOLIC PANEL - Abnormal; Notable for the following components:   Potassium 3.0 (*)    Glucose, Bld 118  (*)    Creatinine, Ser 1.57 (*)    Calcium 8.4 (*)    GFR calc non Af Amer 48 (*)    GFR calc Af Amer 55 (*)    All other components within normal limits  POC SARS CORONAVIRUS 2 AG -  ED - Abnormal; Notable for the following components:   SARS Coronavirus 2 Ag POSITIVE (*)  All other components within normal limits    EKG None  Radiology DG Chest Portable 1 View  Result Date: 12/24/2019 CLINICAL DATA:  Patient with chills and shortness of breath. EXAM: PORTABLE CHEST 1 VIEW COMPARISON:  Chest radiograph 02/21/2013 FINDINGS: Monitoring leads overlie the patient. Stable cardiac and mediastinal contours. Large patchy area of consolidation within the right mid lower lung. Small area of consolidation left lower lung. No pleural effusion or pneumothorax. Thoracic spine degenerative changes. IMPRESSION: Patchy consolidation throughout the right lung with minimal consolidative opacities left lung base favored to represent multifocal infection. Electronically Signed   By: Lovey Newcomer M.D.   On: 12/24/2019 06:46    Procedures Procedures (including critical care time)  Medications Ordered in ED Medications  albuterol (VENTOLIN HFA) 108 (90 Base) MCG/ACT inhaler 2 puff (2 puffs Inhalation Given 12/24/19 0803)  acetaminophen (TYLENOL) tablet 1,000 mg (1,000 mg Oral Given 12/24/19 0803)  potassium chloride SA (KLOR-CON) CR tablet 40 mEq (40 mEq Oral Given 12/24/19 [id number])    ED Course  I have reviewed the triage vital signs and the nursing notes.  Pertinent labs & imaging results that were available during my care of the patient were reviewed by me and considered in my medical decision making (see chart for details).    MDM Rules/Calculators/A&P                      BP 112/76   Pulse 91   Temp 99.9 F (37.7 C) (Oral)   Resp (!) 23   SpO2 98%   Final Clinical Impression(s) / ED Diagnoses Final diagnoses:  Pneumonia due to COVID-19 virus    Rx / DC Orders ED Discharge Orders          Ordered    benzonatate (TESSALON) 100 MG capsule  Every 8 hours     12/24/19 0937    acetaminophen (TYLENOL) 500 MG tablet  Every 6 hours PRN     12/24/19 0937         7:07 AM Patient here with symptom consistent with COVID-19 infection.  He is febrile with a temperature of 102, mildly tachycardic with a heart rate of 104, O2 sats at 93% on room air while at rest.  He is mildly tachypneic on conversation.  Chest x-ray demonstrate patchy consolidation throughout the right lung with minimal consolidative opacity left lung base favored to represent multifocal pneumonia.  We will give Tylenol, albuterol inhaler check Covid test, and will check O2 with ambulation.  8:15 AM O2 sats at 96-97% on room air when ambulating.  At this time, anticipate discharging home with strict return precaution.  I have contacted our Monoclonal Antibody Clinic to assess if pt qualifies as a candidate for infusion.  Will awaits recommendation.   9:08 AM Patient report he would prefer just to be discharged and get additional treatment at the monoclonal antibody infusion.  I appreciate assistance from our monoclonal antibody clinic who will reach out to patient to set up close follow-up.  Patient is then to return promptly if his condition worsen.  Jimmy Day was evaluated in Emergency Department on 12/24/2019 for the symptoms described in the history of present illness. He was evaluated in the context of the global COVID-19 pandemic, which necessitated consideration that the patient might be at risk for infection with the SARS-CoV-2 virus that causes COVID-19. Institutional protocols and algorithms that pertain to the evaluation of patients at risk for COVID-19 are in a state of  rapid change based on information released by regulatory bodies including the CDC and federal and state organizations. These policies and algorithms were followed during the patient's care in the ED.    Domenic Moras, PA-C [phone]      Delora Fuel, MD [phone]

## 2019-12-25 ENCOUNTER — Ambulatory Visit (HOSPITAL_COMMUNITY)
Admission: RE | Admit: 2019-12-25 | Discharge: 2019-12-25 | Disposition: A | Discharge status: Home / Self Care | Payer: 59 | Source: Ambulatory Visit | Attending: Pulmonary Disease | Admitting: Pulmonary Disease

## 2019-12-25 ENCOUNTER — Telehealth: Payer: Self-pay | Admitting: Nurse Practitioner

## 2019-12-25 DIAGNOSIS — U071 COVID-19: Secondary | ICD-10-CM | POA: Diagnosis present

## 2019-12-25 DIAGNOSIS — Z23 Encounter for immunization: Secondary | ICD-10-CM | POA: Insufficient documentation

## 2019-12-25 DIAGNOSIS — I10 Essential (primary) hypertension: Secondary | ICD-10-CM | POA: Diagnosis present

## 2019-12-25 MED ORDER — SODIUM CHLORIDE 0.9 % IV SOLN
INTRAVENOUS | Status: DC | PRN
  Administered 2019-12-25: 250 mL via INTRAVENOUS

## 2019-12-25 MED ORDER — EPINEPHRINE 0.3 MG/0.3ML IJ SOAJ: 0.3000 mg | Freq: Once | INTRAMUSCULAR | Status: DC | PRN

## 2019-12-25 MED ORDER — ALBUTEROL SULFATE HFA 108 (90 BASE) MCG/ACT IN AERS: 2.0000 | INHALATION_SPRAY | Freq: Once | RESPIRATORY_TRACT | Status: DC | PRN

## 2019-12-25 MED ORDER — FAMOTIDINE IN NACL 20-0.9 MG/50ML-% IV SOLN: 20.0000 mg | Freq: Once | INTRAVENOUS | Status: DC | PRN

## 2019-12-25 MED ORDER — DIPHENHYDRAMINE HCL 50 MG/ML IJ SOLN: 50.0000 mg | Freq: Once | INTRAMUSCULAR | Status: DC | PRN

## 2019-12-25 MED ORDER — METHYLPREDNISOLONE SODIUM SUCC 125 MG IJ SOLR: 125.0000 mg | Freq: Once | INTRAMUSCULAR | Status: DC | PRN

## 2019-12-25 MED ORDER — SODIUM CHLORIDE 0.9 % IV SOLN
Freq: Once | INTRAVENOUS | Status: AC
  Filled 2019-12-25: qty 10

## 2019-12-25 MED FILL — BENZONATATE 100 MG CAPS: 100 | 7 days supply | Qty: 21 | Fill #0

## 2019-12-25 MED FILL — ACETAMINOPHEN EXTRA STRENGT: 500 | 25 days supply | Qty: 100 | Fill #0

## 2019-12-25 NOTE — Progress Notes (Signed)
Patient ID: Jimmy Day, male   DOB: 03/19/1961, 59 y.o.   MRN: [id number]  Diagnosis: [id number]  Physician:  Procedure: Covid Infusion Clinic Med: casirivimab\imdevimab infusion - Provided patient with casirivimab\imdevimab fact sheet for patients, parents and caregivers prior to infusion.  Complications: No immediate complications noted.  Discharge: Discharged home   Heide Scales 12/25/2019

## 2019-12-25 NOTE — Discharge Instructions (Signed)

## 2019-12-25 NOTE — Telephone Encounter (Signed)
Called to Discuss with patient about Covid symptoms and the use of bamlanivimab, a monoclonal antibody infusion for those with mild to moderate Covid symptoms and at a high risk of hospitalization.     Pt is qualified for this infusion at the [facility] infusion center due to co-morbid conditions and/or a member of an at-risk group.     Unable to reach pt  

## 2020-01-03 ENCOUNTER — Other Ambulatory Visit: Payer: Self-pay | Admitting: *Deleted

## 2020-01-03 ENCOUNTER — Other Ambulatory Visit: Payer: Self-pay | Admitting: Hematology & Oncology

## 2020-01-03 DIAGNOSIS — C9000 Multiple myeloma not having achieved remission: Secondary | ICD-10-CM

## 2020-01-03 MED ORDER — LENALIDOMIDE 10 MG PO CAPS: ORAL_CAPSULE | ORAL | 0 refills | Status: DC

## 2020-01-04 ENCOUNTER — Other Ambulatory Visit: Payer: Self-pay | Admitting: Hematology & Oncology

## 2020-01-04 DIAGNOSIS — C9000 Multiple myeloma not having achieved remission: Secondary | ICD-10-CM

## 2020-01-05 ENCOUNTER — Other Ambulatory Visit: Payer: Self-pay | Admitting: Hematology & Oncology

## 2020-01-05 ENCOUNTER — Other Ambulatory Visit: Payer: Self-pay | Admitting: Family

## 2020-01-05 MED FILL — TAMSULOSIN HCL 0.4 MG CAP: 0.4 | 30 days supply | Qty: 30 | Fill #6

## 2020-01-05 MED FILL — OMEGA-3-ACID ETHYL ESTERS 1: 1 | 7 days supply | Qty: 30 | Fill #0

## 2020-01-05 MED FILL — AMLODIPINE BESYLATE 5 MG TA: 5 | 90 days supply | Qty: 90 | Fill #1

## 2020-01-08 ENCOUNTER — Ambulatory Visit: Payer: 59

## 2020-01-08 ENCOUNTER — Other Ambulatory Visit: Payer: 59

## 2020-01-08 ENCOUNTER — Ambulatory Visit: Payer: 59 | Admitting: Hematology & Oncology

## 2020-01-08 MED FILL — GENERLAC 10 GM/15 ML SOLN: 10 | 2 days supply | Qty: 236 | Fill #0

## 2020-01-19 ENCOUNTER — Other Ambulatory Visit: Payer: 59

## 2020-01-19 ENCOUNTER — Ambulatory Visit: Payer: 59 | Admitting: Hematology & Oncology

## 2020-01-19 ENCOUNTER — Ambulatory Visit: Payer: 59

## 2020-01-29 ENCOUNTER — Ambulatory Visit: Payer: 59

## 2020-01-29 ENCOUNTER — Ambulatory Visit: Payer: 59 | Admitting: Hematology & Oncology

## 2020-01-29 ENCOUNTER — Other Ambulatory Visit: Payer: 59

## 2020-02-01 ENCOUNTER — Other Ambulatory Visit: Payer: Self-pay | Admitting: *Deleted

## 2020-02-01 DIAGNOSIS — C9000 Multiple myeloma not having achieved remission: Secondary | ICD-10-CM

## 2020-02-01 MED ORDER — LENALIDOMIDE 10 MG PO CAPS: ORAL_CAPSULE | ORAL | 0 refills | Status: DC

## 2020-02-06 ENCOUNTER — Ambulatory Visit: Payer: 59 | Admitting: Family

## 2020-02-12 ENCOUNTER — Inpatient Hospital Stay: Payer: 59

## 2020-02-12 ENCOUNTER — Encounter: Payer: Self-pay | Admitting: Hematology & Oncology

## 2020-02-12 ENCOUNTER — Inpatient Hospital Stay (HOSPITAL_BASED_OUTPATIENT_CLINIC_OR_DEPARTMENT_OTHER): Payer: 59 | Admitting: Hematology & Oncology

## 2020-02-12 ENCOUNTER — Inpatient Hospital Stay: Payer: 59 | Attending: Hematology & Oncology

## 2020-02-12 ENCOUNTER — Other Ambulatory Visit: Payer: Self-pay

## 2020-02-12 VITALS — BP 113/71 | HR 66 | Temp 97.1°F | Resp 18 | Wt 194.0 lb

## 2020-02-12 DIAGNOSIS — Z9484 Stem cells transplant status: Secondary | ICD-10-CM | POA: Insufficient documentation

## 2020-02-12 DIAGNOSIS — Z79899 Other long term (current) drug therapy: Secondary | ICD-10-CM | POA: Diagnosis not present

## 2020-02-12 DIAGNOSIS — C9000 Multiple myeloma not having achieved remission: Secondary | ICD-10-CM

## 2020-02-12 DIAGNOSIS — Z7982 Long term (current) use of aspirin: Secondary | ICD-10-CM | POA: Insufficient documentation

## 2020-02-12 LAB — CBC WITH DIFFERENTIAL (CANCER CENTER ONLY)
Abs Immature Granulocytes: 0.01 10*3/uL (ref 0.00–0.07)
Basophils Absolute: 0.1 10*3/uL (ref 0.0–0.1)
Basophils Relative: 2 %
Eosinophils Absolute: 0.1 10*3/uL (ref 0.0–0.5)
Eosinophils Relative: 2 %
HCT: 38.5 % — ABNORMAL LOW (ref 39.0–52.0)
Hemoglobin: 13.4 g/dL (ref 13.0–17.0)
Immature Granulocytes: 0 %
Lymphocytes Relative: 44 %
Lymphs Abs: 1.7 10*3/uL (ref 0.7–4.0)
MCH: 32.4 pg (ref 26.0–34.0)
MCHC: 34.8 g/dL (ref 30.0–36.0)
MCV: 93.2 fL (ref 80.0–100.0)
Monocytes Absolute: 0.4 10*3/uL (ref 0.1–1.0)
Monocytes Relative: 10 %
Neutro Abs: 1.7 10*3/uL (ref 1.7–7.7)
Neutrophils Relative %: 42 %
Platelet Count: 209 10*3/uL (ref 150–400)
RBC: 4.13 MIL/uL — ABNORMAL LOW (ref 4.22–5.81)
RDW: 13.6 % (ref 11.5–15.5)
WBC Count: 4 10*3/uL (ref 4.0–10.5)
nRBC: 0 % (ref 0.0–0.2)

## 2020-02-12 LAB — CMP (CANCER CENTER ONLY)
ALT: 12 U/L (ref 0–44)
AST: 12 U/L — ABNORMAL LOW (ref 15–41)
Albumin: 4.2 g/dL (ref 3.5–5.0)
Alkaline Phosphatase: 44 U/L (ref 38–126)
Anion gap: 6 (ref 5–15)
BUN: 19 mg/dL (ref 6–20)
CO2: 28 mmol/L (ref 22–32)
Calcium: 9.8 mg/dL (ref 8.9–10.3)
Chloride: 103 mmol/L (ref 98–111)
Creatinine: 1.34 mg/dL — ABNORMAL HIGH (ref 0.61–1.24)
GFR, Est AFR Am: >60 mL/min (ref >60)
GFR, Estimated: 58 mL/min — ABNORMAL LOW (ref >60)
Glucose, Bld: 115 mg/dL — ABNORMAL HIGH (ref 70–99)
Potassium: 3.9 mmol/L (ref 3.5–5.1)
Sodium: 137 mmol/L (ref 135–145)
Total Bilirubin: 0.8 mg/dL (ref 0.3–1.2)
Total Protein: 6.9 g/dL (ref 6.5–8.1)

## 2020-02-12 MED ORDER — DENOSUMAB 120 MG/1.7ML ~~LOC~~ SOLN
SUBCUTANEOUS | Status: AC
  Filled 2020-02-12: qty 1.7

## 2020-02-12 MED ORDER — DENOSUMAB 120 MG/1.7ML ~~LOC~~ SOLN
120.0000 mg | Freq: Once | SUBCUTANEOUS | Status: AC
  Administered 2020-02-12: 120 mg via SUBCUTANEOUS

## 2020-02-12 MED FILL — TAMSULOSIN HCL 0.4 MG CAP: 0.4 | 30 days supply | Qty: 30 | Fill #7

## 2020-02-12 MED FILL — SILDENAFIL CITRATE 100 MG T: 100 | 13 days supply | Qty: 4 | Fill #9

## 2020-02-12 MED FILL — OMEGA-3-ACID ETHYL ESTERS 1: 1 | 3 days supply | Qty: 15 | Fill #1

## 2020-02-12 NOTE — Progress Notes (Signed)
Hematology and Oncology Follow Up Visit  Jimmy Day [facility] [id number] 08/29/1961 59 y.o. 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 is here today for follow-up.  He is really doing well.  He really has had no complaints.  He has had no problems with nausea or vomiting.  He has had no issues with cough or shortness of breath.  He is still working.  He is avoided the coronavirus.  Unfortunately, he and his wife were supposed to go to [facility] for a little vacation last year and they really could not because of the coronavirus.  He has had no problems with the Revlimid.  So far, there is been no evidence of recurrence of the myeloma.    There is no monoclonal spike in his blood when we saw him last year.  His last kappa light chain back in August 20 20 was 2.4 mg/dL.  He has had no tooth issues.  More we last saw him, he cracked a tooth.  This is not been a problem now.  This was fixed.  I think he can get his Xgeva today.  Surprisingly, his wife no longer works at [employer].  She works at a post Customer service manager.  I am happy for her.  This will be a lot easier for her.    Currently, his performance status is ECOG 0.     Medications:  Allergies as of 02/12/2020   No Known Allergies     Medication List       Accurate as of February 12, 2020  3:46 PM. If you have any questions, ask your nurse or doctor.        acetaminophen 500 MG tablet Commonly known as: TYLENOL Take 1 tablet (500 mg total) by mouth every 6 (six) hours as needed.   amLODipine 5 MG tablet Commonly known as: NORVASC TAKE 1 TABLET (5 MG TOTAL) BY MOUTH DAILY.   aspirin 325 MG EC tablet Take 325 mg by mouth daily.   benzonatate 100 MG capsule Commonly known as: TESSALON Take 1 capsule (100 mg total) by  mouth every 8 (eight) hours.   cephALEXin 500 MG capsule Commonly known as: KEFLEX Take 2 capsules (1,000 mg total) by mouth 2 (two) times daily.   cholecalciferol 25 MCG (1000 UNIT) tablet Commonly known as: VITAMIN D3 Take 1 tablet (1,000 Units total) by mouth daily.   Fish Oil 1000 MG Caps Take 2 mg by mouth daily.   Generlac 10 GM/15ML Soln Generic drug: lactulose (encephalopathy) TAKE 30 MLS (20 G TOTAL) BY MOUTH EVERY 4 (FOUR) HOURS AS NEEDED (TAKE AS DIRECTED UNTIL BOWEL MOVEMENT.).   lenalidomide 10 MG capsule Commonly known as: Revlimid TAKE 1 CAPSULE DAILY FOR 21 DAYS AND THEN 7 DAYS OFF [id number]   omega-3 acid ethyl esters 1 g capsule Commonly known as: LOVAZA TAKE 2 CAPSULES (2 G TOTAL) BY MOUTH 2 (TWO) TIMES DAILY.   sildenafil 100 MG tablet Commonly known as: Viagra Take 0.5-1 tablets (50-100 mg total) by mouth daily as needed for up to 30 days for erectile dysfunction.   tamsulosin 0.4 MG Caps capsule Commonly known as: FLOMAX Take 0.4 mg by mouth daily.       Allergies: No Known Allergies  Past Medical History, Surgical history,  Social history, and Family History were reviewed and updated.  Review of Systems: Review of Systems  Constitutional: Negative.   HENT: Negative.   Eyes: Negative.   Respiratory: Negative.   Cardiovascular: Negative.   Gastrointestinal: Negative.   Genitourinary: Negative.   Musculoskeletal: Negative.   Skin: Negative.   Neurological: Negative.   Endo/Heme/Allergies: Negative.   Psychiatric/Behavioral: Negative.      Physical Exam:  weight is 194 lb (88 kg). His temporal temperature is 97.1 F (36.2 C) (abnormal). His blood pressure is 113/71 and his pulse is 66. His respiration is 18 and oxygen saturation is 100%.   Wt Readings from Last 3 Encounters:  02/12/20 194 lb (88 kg)  08/09/19 203 lb (92.1 kg)  07/07/19 202 lb (91.6 kg)    Physical Exam Vitals reviewed.  HENT:     Head: Normocephalic and  atraumatic.  Eyes:     Pupils: Pupils are equal, round, and reactive to light.  Cardiovascular:     Rate and Rhythm: Normal rate and regular rhythm.     Heart sounds: Normal heart sounds.  Pulmonary:     Effort: Pulmonary effort is normal.     Breath sounds: Normal breath sounds.  Abdominal:     General: Bowel sounds are normal.     Palpations: Abdomen is soft.  Musculoskeletal:        General: No tenderness or deformity. Normal range of motion.     Cervical back: Normal range of motion.  Lymphadenopathy:     Cervical: No cervical adenopathy.  Skin:    General: Skin is warm and dry.     Findings: No erythema or rash.  Neurological:     Mental Status: He is alert and oriented to person, place, and time.  Psychiatric:        Behavior: Behavior normal.        Thought Content: Thought content normal.        Judgment: Judgment normal.       Lab Results  Component Value Date   WBC 4.0 02/12/2020   HGB 13.4 02/12/2020   HCT 38.5 (L) 02/12/2020   MCV 93.2 02/12/2020   PLT 209 02/12/2020   Lab Results  Component Value Date   FERRITIN 152 08/17/2014   IRON 106 08/17/2014   TIBC 253 08/17/2014   UIBC 146 08/17/2014   IRONPCTSAT 42 08/17/2014   Lab Results  Component Value Date   RBC 4.13 (L) 02/12/2020   Lab Results  Component Value Date   KPAFRELGTCHN 24.1 (H) 07/07/2019   LAMBDASER 12.1 07/07/2019   KAPLAMBRATIO 1.99 (H) 07/07/2019   Lab Results  Component Value Date   IGGSERUM 1,681 (H) 07/07/2019   IGA 18 (L) 07/07/2019   IGMSERUM 17 (L) 07/07/2019   Lab Results  Component Value Date   TOTALPROTELP 7.0 07/07/2019   ALBUMINELP 4.0 07/07/2019   A1GS 0.2 07/07/2019   A2GS 0.4 07/07/2019   BETS 0.9 07/07/2019   BETA2SER 0.2 10/04/2015   GAMS 1.6 07/07/2019   MSPIKE Not Observed 07/07/2019   SPEI Comment 01/05/2019     Chemistry      Component Value Date/Time   NA 136 12/24/2019 0604   NA 139 08/13/2017 0918   NA 142 04/01/2015 0834   K 3.0 (L)  12/24/2019 0604   K 3.6 08/13/2017 0918   K 3.4 (L) 04/01/2015 0834   CL 98 12/24/2019 0604   CL 105 08/13/2017 0918   CO2 26 12/24/2019 0604   CO2 28  08/13/2017 0918   CO2 20 (L) 04/01/2015 0834   BUN 9 12/24/2019 0604   BUN 8 08/13/2017 0918   BUN 10.4 04/01/2015 0834   CREATININE 1.57 (H) 12/24/2019 0604   CREATININE 1.27 (H) 07/07/2019 0752   CREATININE 1.4 (H) 08/13/2017 0918   CREATININE 1.2 04/01/2015 0834      Component Value Date/Time   CALCIUM 8.4 (L) 12/24/2019 0604   CALCIUM 9.1 08/13/2017 0918   CALCIUM 8.6 04/01/2015 0834   ALKPHOS 36 (L) 07/07/2019 0752   ALKPHOS 42 08/13/2017 0918   ALKPHOS 39 (L) 04/01/2015 0834   AST 16 07/07/2019 0752   AST 17 04/01/2015 0834   ALT 17 07/07/2019 0752   ALT 21 08/13/2017 0918   ALT 19 04/01/2015 0834   BILITOT 1.0 07/07/2019 0752   BILITOT 0.56 04/01/2015 0834      Impression and Plan: Mr. Letbetter is a pleasant 59 yo African American gentleman with IgG kappa myeloma.  He had induction therapy with RVD.  He then underwent s/p autologous stem cell transplant at [facility] in April 2016. He is doing well and is asymptomatic at this time.   Hopefully, he will still be in remission.  It is now been over 4 years since he had his transplant.  Again, we will give his Xgeva today.    I will plan to see him back in 6 months.  We will administer Xgeva when we see him back.   [name], MD 3/22/20213:46 PM

## 2020-02-13 LAB — IGG, IGA, IGM
IgA: 18 mg/dL — ABNORMAL LOW (ref 90–386)
IgG (Immunoglobin G), Serum: 1668 mg/dL — ABNORMAL HIGH (ref 603–1613)
IgM (Immunoglobulin M), Srm: 20 mg/dL (ref 20–172)

## 2020-02-13 LAB — KAPPA/LAMBDA LIGHT CHAINS
Kappa free light chain: 24.4 mg/L — ABNORMAL HIGH (ref 3.3–19.4)
Kappa, lambda light chain ratio: 1.81 — ABNORMAL HIGH (ref 0.26–1.65)
Lambda free light chains: 13.5 mg/L (ref 5.7–26.3)

## 2020-02-14 LAB — PROTEIN ELECTROPHORESIS, SERUM, WITH REFLEX
A/G Ratio: 1.3 (ref 0.7–1.7)
Albumin ELP: 3.6 g/dL (ref 2.9–4.4)
Alpha-1-Globulin: 0.2 g/dL (ref 0.0–0.4)
Alpha-2-Globulin: 0.4 g/dL (ref 0.4–1.0)
Beta Globulin: 0.7 g/dL (ref 0.7–1.3)
Gamma Globulin: 1.5 g/dL (ref 0.4–1.8)
Globulin, Total: 2.8 g/dL (ref 2.2–3.9)
Total Protein ELP: 6.4 g/dL (ref 6.0–8.5)

## 2020-03-12 ENCOUNTER — Other Ambulatory Visit: Payer: Self-pay | Admitting: *Deleted

## 2020-03-12 DIAGNOSIS — C9000 Multiple myeloma not having achieved remission: Secondary | ICD-10-CM

## 2020-03-12 MED ORDER — LENALIDOMIDE 10 MG PO CAPS: ORAL_CAPSULE | ORAL | 0 refills | Status: DC

## 2020-03-14 MED FILL — TAMSULOSIN HCL 0.4 MG CAP: 0.4 | 30 days supply | Qty: 30 | Fill #8

## 2020-03-15 ENCOUNTER — Other Ambulatory Visit: Payer: Self-pay | Admitting: Family

## 2020-03-15 MED FILL — OMEGA-3-ACID ETHYL ESTERS 1: 1 | 8 days supply | Qty: 30 | Fill #2

## 2020-03-18 MED FILL — SILDENAFIL CITRATE 100 MG T: 100 | 4 days supply | Qty: 4 | Fill #0

## 2020-03-18 NOTE — Telephone Encounter (Signed)
Please contact pt to schedule a follow up with me.

## 2020-03-19 NOTE — Telephone Encounter (Signed)
done

## 2020-03-29 ENCOUNTER — Ambulatory Visit: Payer: 59 | Admitting: Family

## 2020-03-29 MED FILL — OMEGA-3-ACID ETHYL ESTERS 1: 1 | 4 days supply | Qty: 15 | Fill #3

## 2020-03-29 MED FILL — SILDENAFIL CITRATE 100 MG T: 100 | 4 days supply | Qty: 4 | Fill #0

## 2020-04-05 ENCOUNTER — Other Ambulatory Visit: Payer: Self-pay | Admitting: *Deleted

## 2020-04-05 DIAGNOSIS — C9000 Multiple myeloma not having achieved remission: Secondary | ICD-10-CM

## 2020-04-05 MED ORDER — LENALIDOMIDE 10 MG PO CAPS: ORAL_CAPSULE | ORAL | 0 refills | Status: DC

## 2020-04-18 ENCOUNTER — Other Ambulatory Visit: Payer: Self-pay | Admitting: Family

## 2020-04-18 MED FILL — TAMSULOSIN HCL 0.4 MG CAP: 0.4 | 30 days supply | Qty: 30 | Fill #9

## 2020-04-18 MED FILL — OMEGA-3-ACID ETHYL ESTERS 1: 1 | 4 days supply | Qty: 15 | Fill #4

## 2020-04-19 MED FILL — AMLODIPINE BESYLATE 5 MG TA: 5 | 30 days supply | Qty: 30 | Fill #0

## 2020-05-07 MED FILL — AMLODIPINE BESYLATE 5 MG TA: 5 | 30 days supply | Qty: 30 | Fill #0

## 2020-05-07 MED FILL — SILDENAFIL CITRATE 100 MG T: 100 | 4 days supply | Qty: 4 | Fill #1

## 2020-05-07 MED FILL — OMEGA-3-ACID ETHYL ESTERS 1: 1 | 4 days supply | Qty: 15 | Fill #5

## 2020-05-07 NOTE — Telephone Encounter (Signed)
04/18/20 Mychart message came back as unread. Mailed letter to pt.

## 2020-05-10 ENCOUNTER — Other Ambulatory Visit: Payer: Self-pay | Admitting: *Deleted

## 2020-05-10 DIAGNOSIS — C9000 Multiple myeloma not having achieved remission: Secondary | ICD-10-CM

## 2020-05-10 MED ORDER — LENALIDOMIDE 10 MG PO CAPS: ORAL_CAPSULE | ORAL | 0 refills | Status: DC

## 2020-06-04 ENCOUNTER — Other Ambulatory Visit: Payer: Self-pay | Admitting: *Deleted

## 2020-06-04 DIAGNOSIS — C9000 Multiple myeloma not having achieved remission: Secondary | ICD-10-CM

## 2020-06-04 MED ORDER — LENALIDOMIDE 10 MG PO CAPS: ORAL_CAPSULE | ORAL | 0 refills | Status: DC

## 2020-06-05 ENCOUNTER — Other Ambulatory Visit: Payer: Self-pay | Admitting: Hematology & Oncology

## 2020-06-05 DIAGNOSIS — C9000 Multiple myeloma not having achieved remission: Secondary | ICD-10-CM

## 2020-06-06 ENCOUNTER — Other Ambulatory Visit: Payer: Self-pay | Admitting: Hematology & Oncology

## 2020-06-06 DIAGNOSIS — C9000 Multiple myeloma not having achieved remission: Secondary | ICD-10-CM

## 2020-06-19 MED FILL — TAMSULOSIN HCL 0.4 MG CAP: 0.4 | 30 days supply | Qty: 30 | Fill #0

## 2020-06-20 ENCOUNTER — Other Ambulatory Visit: Payer: Self-pay | Admitting: Family

## 2020-06-20 MED FILL — OMEGA-3-ACID ETHYL ESTERS 1: 1 | 4 days supply | Qty: 15 | Fill #0

## 2020-06-21 ENCOUNTER — Encounter (HOSPITAL_COMMUNITY): Payer: Self-pay

## 2020-06-21 ENCOUNTER — Ambulatory Visit (HOSPITAL_COMMUNITY)
Admission: EM | Admit: 2020-06-21 | Discharge: 2020-06-21 | Disposition: A | Discharge status: Home / Self Care | Payer: 59 | Attending: Emergency Medicine | Admitting: Emergency Medicine

## 2020-06-21 ENCOUNTER — Other Ambulatory Visit: Payer: Self-pay

## 2020-06-21 DIAGNOSIS — S0502XA Injury of conjunctiva and corneal abrasion without foreign body, left eye, initial encounter: Secondary | ICD-10-CM | POA: Diagnosis not present

## 2020-06-21 MED ORDER — TOBRAMYCIN 0.3 % OP SOLN: 2.0000 [drp] | OPHTHALMIC | 0 refills | Status: AC

## 2020-06-21 MED ORDER — FLUORESCEIN SODIUM 1 MG OP STRP
ORAL_STRIP | OPHTHALMIC | Status: AC
  Filled 2020-06-21: qty 1

## 2020-06-21 MED ORDER — TETRACAINE HCL 0.5 % OP SOLN
OPHTHALMIC | Status: AC
  Filled 2020-06-21: qty 4

## 2020-06-21 NOTE — Discharge Instructions (Signed)
Don't wear your contacts.  Use of eye drops as prescribed.  Please follow up with your eye doctor next week for recheck.  Please return if any worsening of symptoms.

## 2020-06-21 NOTE — ED Provider Notes (Signed)
[facility]    CSN: [id number] Arrival date & time: 06/21/20  [place]      History   Chief Complaint Chief Complaint  Patient presents with   Eye Problem    HPI Jimmy Day is a 59 y.o. male.   Jimmy Day presents with complaints of left eye redness and foreign body/ irritation sensation. Started last night, he noted there was a hair to his eye which he removed. Irritated since. This morning he replaced his contact lens and symptoms improved, until he removed the lens, symptoms returned. Now it is red. No drainage. He wears daily contacts, doesn't sleep in them or re-wear. No other exposures to the eyes. Occasional floaters but no other vision changes. No eye ball pain or movement pain. No other trauma to the eye.    ROS per HPI, negative if not otherwise mentioned.      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 Authorizing Provider  amLODipine (NORVASC) 5 MG tablet TAKE 1 TABLET (5 MG TOTAL) BY MOUTH DAILY. 04/19/20  Yes [name], NP  aspirin 325 MG EC tablet Take 325 mg by mouth daily.   Yes [provider]  cholecalciferol (VITAMIN D3) 25 MCG (1000 UT) tablet Take 1 tablet (1,000 Units total) by mouth daily. 11/28/18  Yes [name], NP  lenalidomide (REVLIMID) 10 MG capsule TAKE 1 CAPSULE DAILY FOR 21 DAYS AND THEN 7 DAYS OFF 06/06/20  Yes Ennever, Rudell Cobb, MD  omega-3 acid ethyl esters (LOVAZA) 1 g capsule TAKE 2 CAPSULES (2 G TOTAL) BY MOUTH 2 (TWO) TIMES DAILY. 06/20/20  Yes [name], NP  Omega-3 Fatty Acids (FISH OIL) 1000 MG CAPS Take 2 mg by mouth daily. 01/05/20  Yes [provider]  tamsulosin (FLOMAX) 0.4 MG CAPS capsule Take 0.4 mg by mouth daily. 01/05/20  Yes [provider]  acetaminophen (TYLENOL) 500 MG tablet Take 1 tablet (500 mg total) by mouth every 6 (six) hours as needed. 12/24/19   Domenic Moras, PA-C  GENERLAC 10 GM/15ML SOLN TAKE 30 MLS (20 G TOTAL) BY MOUTH EVERY 4 (FOUR) HOURS AS NEEDED (TAKE AS DIRECTED UNTIL BOWEL MOVEMENT.). 01/05/20   [name], MD  sildenafil (VIAGRA) 100 MG tablet TAKE 1/2 - 1 TABLETS BY MOUTH DAILY AS NEEDED FOR  ERECTILE DYSFUNCTION 03/18/20   [name], NP  tobramycin (TOBREX) 0.3 % ophthalmic solution Place 2 drops into the left eye every 4 (four) hours for 7 days. 06/21/20 06/28/20  Zigmund Gottron, NP    Family History Family History  Problem Relation Age of Onset   Breast cancer Mother    Heart attack Father 59   Kidney disease Brother        RENAL FAILURE   Hypertension Brother        (died from sepsis at 31)   Heart attack Maternal Aunt 65       CABG   Stroke Maternal Uncle        CVA   Heart attack Paternal Grandmother    Diabetes Neg Hx    Colon cancer Neg Hx    Esophageal cancer Neg Hx    Rectal cancer  Neg Hx    Stomach cancer Neg Hx    Colon polyps Neg Hx     Social History Social History   Tobacco Use   Smoking status: Never Smoker   Smokeless tobacco: Never Used   Tobacco comment: never used tobacco  Vaping Use   Vaping Use: Never used  Substance Use Topics   Alcohol use: Yes    Alcohol/week: 0.0 standard drinks    Comment:  rarely   Drug use: No     Allergies   Patient has no known allergies.   Review of Systems Review of Systems   Physical Exam Triage Vital Signs ED Triage Vitals  Enc Vitals Group     BP 06/21/20 1952 (!) 152/85     Pulse Rate 06/21/20 1952 53     Resp 06/21/20 1952 18     Temp 06/21/20 1952 98.2 F (36.8 C)     Temp Source 06/21/20 1952 Oral     SpO2 06/21/20 1952 99 %     Weight --      Height --      Head Circumference --      Peak Flow --      Pain Score 06/21/20 1954 7     Pain Loc --      Pain Edu? --      Excl. in [place]? --    No data found.  Updated Vital Signs BP (!) 152/85 (BP Location: Right Arm)    Pulse 53    Temp 98.2 F (36.8 C) (Oral)    Resp 18    SpO2 99%   Visual Acuity Right Eye Distance: 20/25 Left Eye Distance: 20/30 Bilateral Distance: 20/25  Right Eye Near:   Left Eye Near:    Bilateral Near:     Physical Exam Constitutional:      Appearance: He is well-developed.  Eyes:     General: Lids are normal. Vision grossly intact.        Left eye: No foreign body, discharge or hordeolum.     Extraocular Movements: Extraocular movements intact.     Conjunctiva/sclera:     Left eye: Left conjunctiva is injected.     Pupils:     Left eye: Fluorescein uptake present.     Slit lamp exam:    Left eye: No corneal ulcer, foreign body or photophobia.      Comments: Abrasion noted medial to pupil of left eye, overlying medial aspect of iris; no visible ulceration present ; symptoms resolved with tetracaine   Cardiovascular:     Rate and Rhythm: Normal rate.  Pulmonary:  Effort: Pulmonary effort is  normal.  Skin:    General: Skin is warm and dry.  Neurological:     Mental Status: He is alert and oriented to person, place, and time.      UC Treatments / Results  Labs (all labs ordered are listed, but only abnormal results are displayed) Labs Reviewed - No data to display  EKG   Radiology No results found.  Procedures Procedures (including critical care time)  Medications Ordered in UC Medications - No data to display  Initial Impression / Assessment and Plan / UC Course  I have reviewed the triage vital signs and the nursing notes.  Pertinent labs & imaging results that were available during my care of the patient were reviewed by me and considered in my medical decision making (see chart for details).     Left corneal abrasion. Contact wearer. Tobramycin provided. Follow up with eye next week. Return precautions provided. Patient verbalized understanding and agreeable to plan.   Final Clinical Impressions(s) / UC Diagnoses   Final diagnoses:  Abrasion of left cornea, initial encounter     Discharge Instructions     Don't wear your contacts.  Use of eye drops as prescribed.  Please follow up with your eye doctor next week for recheck.  Please return if any worsening of symptoms.    ED Prescriptions    Medication Sig Dispense Auth. Provider   tobramycin (TOBREX) 0.3 % ophthalmic solution Place 2 drops into the left eye every 4 (four) hours for 7 days. 5 mL Zigmund Gottron, NP     PDMP not reviewed this encounter.   Zigmund Gottron, NP 06/21/20 2047

## 2020-06-21 NOTE — ED Triage Notes (Signed)
Pt states he had eye irritation/hair in left eye yesterday which he removed but states that his eye still "feels like there's something in there." Pt states he placed his contacts in his eyes today and the discomfort/irritation improved; but returned when he removed the contacts. "Feels better with the contacts in". Left eye with redness to sclera.

## 2020-06-24 ENCOUNTER — Other Ambulatory Visit: Payer: Self-pay | Admitting: Family

## 2020-06-24 MED FILL — SILDENAFIL CITRATE 100 MG T: 100 | 5 days supply | Qty: 5 | Fill #2

## 2020-06-25 MED FILL — AMLODIPINE BESYLATE 5 MG TA: 5 | 30 days supply | Qty: 30 | Fill #0

## 2020-06-27 ENCOUNTER — Other Ambulatory Visit: Payer: Self-pay | Admitting: *Deleted

## 2020-06-27 DIAGNOSIS — C9000 Multiple myeloma not having achieved remission: Secondary | ICD-10-CM

## 2020-06-27 MED ORDER — LENALIDOMIDE 10 MG PO CAPS: ORAL_CAPSULE | ORAL | 0 refills | Status: DC

## 2020-06-28 MED FILL — OMEGA-3-ACID ETHYL ESTERS 1: 1 | 8 days supply | Qty: 30 | Fill #1

## 2020-06-28 MED FILL — SILDENAFIL CITRATE 100 MG T: 100 | 3 days supply | Qty: 3 | Fill #3

## 2020-07-31 ENCOUNTER — Encounter: Payer: Self-pay | Admitting: Family

## 2020-07-31 ENCOUNTER — Other Ambulatory Visit: Payer: Self-pay

## 2020-07-31 ENCOUNTER — Other Ambulatory Visit: Payer: Self-pay | Admitting: Family

## 2020-07-31 ENCOUNTER — Ambulatory Visit: Payer: 59 | Admitting: Family

## 2020-07-31 VITALS — BP 122/82 | HR 65 | Resp 16 | Ht 71.0 in | Wt 197.0 lb

## 2020-07-31 DIAGNOSIS — Z23 Encounter for immunization: Secondary | ICD-10-CM | POA: Diagnosis not present

## 2020-07-31 DIAGNOSIS — C9000 Multiple myeloma not having achieved remission: Secondary | ICD-10-CM

## 2020-07-31 DIAGNOSIS — I10 Essential (primary) hypertension: Secondary | ICD-10-CM

## 2020-07-31 DIAGNOSIS — E1169 Type 2 diabetes mellitus with other specified complication: Secondary | ICD-10-CM

## 2020-07-31 DIAGNOSIS — N529 Male erectile dysfunction, unspecified: Secondary | ICD-10-CM

## 2020-07-31 DIAGNOSIS — E785 Hyperlipidemia, unspecified: Secondary | ICD-10-CM | POA: Diagnosis not present

## 2020-07-31 DIAGNOSIS — N400 Enlarged prostate without lower urinary tract symptoms: Secondary | ICD-10-CM

## 2020-07-31 MED FILL — SILDENAFIL CITRATE 100 MG T: 100 | 3 days supply | Qty: 3 | Fill #0

## 2020-07-31 MED FILL — AMLODIPINE BESYLATE 5 MG TA: 5 | 90 days supply | Qty: 90 | Fill #0

## 2020-07-31 MED FILL — OMEGA-3-ACID ETHYL ESTERS 1: 1 | 8 days supply | Qty: 30 | Fill #0

## 2020-07-31 NOTE — Progress Notes (Signed)
Subjective:    Patient ID: Jimmy Day, male    DOB: [birth year], 60 y.o.   MRN: [id number]  HPI  Patient is a 59 yr old male who presents today for follow up.  HTN- on amlodipine 5mg . BP Readings from Last 3 Encounters:  07/31/20 122/82  06/21/20 (!) 152/85  02/12/20 113/71   BPH- urinating without difficulty. Reports that he is no longer needing flomax. Had neg biopsy 2019.  Lab Results  Component Value Date   PSA1 3.7 04/03/2016   PSA 4.90 (H) 08/09/2019   PSA 4.47 (H) 11/26/2017   PSA 0.97 04/08/2009   Kappa light chain myeloma- pt underwent autologus stem cell transplant 4/16.  He is followed by oncology and being treated with Revlimid and Xgeva.    ED-  Using viagra prn.      Review of Systems See HPI  Past Medical History:  Diagnosis Date  . Elevated PSA 11/29/2017  . Fasting hyperglycemia    NORMAL A1c  . Hyperlipidemia   . Hypertension   . Kappa light chain myeloma ([place]) 02/02/2014  . Testosterone deficiency    Dr Jimmy Day     Social History   Socioeconomic History  . Marital status: Married    Spouse name: Not on file  . Number of children: 3  . Years of education: Not on file  . Highest education level: Not on file  Occupational History  . Occupation: Truck Scientist, product/process development  Tobacco Use  . Smoking status: Never Smoker  . Smokeless tobacco: Never Used  . Tobacco comment: never used tobacco  Vaping Use  . Vaping Use: Never used  Substance and Sexual Activity  . Alcohol use: Yes    Alcohol/week: 0.0 standard drinks    Comment:  rarely  . Drug use: No  . Sexual activity: Yes    Birth control/protection: Condom  Other Topics Concern  . Not on file  Social History Narrative   Son in [place] in [place]   9 grandchildren (3 grandsons, 3 grandchildren)   Daughter at [employer] state   Married   Truck driver   Enjoys exercise, basketball, watch sports, work around the Magazine features editor in [place] Strain:   . Difficulty of Paying Living Expenses: Not on file  Food Insecurity:   . Worried About Charity fundraiser in the Last Year: Not on file  . Ran Out of Food in the Last Year: Not on file  Transportation Needs:   . Lack of Transportation (Medical): Not on file  . Lack of Transportation (Non-Medical): Not on file  Physical Activity:   . Days of Exercise per Week: Not on file  . Minutes of Exercise per Session: Not on file  Stress:   . Feeling of Stress : Not on file  Social Connections:   . Frequency of Communication with Friends and Family: Not on file  . Frequency of Social Gatherings with Friends and Family: Not on file  . Attends Religious Services: Not on file  . Active Member of Clubs or Organizations: Not on file  . Attends Archivist Meetings: Not on file  . Marital Status: Not on file  Intimate Partner Violence:   . Fear of Current or Ex-Partner: Not on file  . Emotionally Abused: Not on file  . Physically Abused: Not on file  . Sexually Abused: Not on file  Past Surgical History:  Procedure Laterality Date  . CERVICAL PLATE INSERTION     POST COMPRESSION DISC INJURY  . LIPOMAS REMOVAL     BENIGN FROM CHEST AND BACK    Family History  Problem Relation Age of Onset  . Breast cancer Mother   . Heart attack Father 68  . Kidney disease Brother        RENAL FAILURE  . Hypertension Brother        (died from sepsis at 19)  . Heart attack Maternal Aunt 65       CABG  . Stroke Maternal Uncle        CVA  . Heart attack Paternal Grandmother   . Diabetes Neg Hx   . Colon cancer Neg Hx   . Esophageal cancer Neg Hx   . Rectal cancer Neg Hx   . Stomach cancer Neg Hx   . Colon polyps Neg Hx     No Known Allergies  Current Outpatient Medications on File Prior to Visit  Medication Sig Dispense Refill  . acetaminophen (TYLENOL) 500 MG tablet Take 1 tablet (500 mg total) by mouth every 6 (six) hours as  needed. 30 tablet 0  . amLODipine (NORVASC) 5 MG tablet TAKE 1 TABLET BY MOUTH ONCE DAILY 30 tablet 0  . aspirin 325 MG EC tablet Take 325 mg by mouth daily.    . cholecalciferol (VITAMIN D3) 25 MCG (1000 UT) tablet Take 1 tablet (1,000 Units total) by mouth daily.    Marland Kitchen GENERLAC 10 GM/15ML SOLN TAKE 30 MLS (20 G TOTAL) BY MOUTH EVERY 4 (FOUR) HOURS AS NEEDED (TAKE AS DIRECTED UNTIL BOWEL MOVEMENT.). 236 mL 2  . lenalidomide (REVLIMID) 10 MG capsule TAKE 1 CAPSULE DAILY FOR 21 DAYS AND THEN 7 DAYS OFF [id number] 21 capsule 0  . omega-3 acid ethyl esters (LOVAZA) 1 g capsule TAKE 2 CAPSULES (2 G TOTAL) BY MOUTH 2 (TWO) TIMES DAILY. 15 capsule 5  . Omega-3 Fatty Acids (FISH OIL) 1000 MG CAPS Take 2 mg by mouth daily.    . sildenafil (VIAGRA) 100 MG tablet TAKE 1/2 - 1 TABLETS BY MOUTH DAILY AS NEEDED FOR ERECTILE DYSFUNCTION 4 tablet 3   Current Facility-Administered Medications on File Prior to Visit  Medication Dose Route Frequency Provider Last Rate Last Admin  . 0.9 %  sodium chloride infusion  500 mL Intravenous Once Irene Shipper, MD        BP 122/82 (BP Location: Left Arm, Patient Position: Sitting, Cuff Size: Normal)   Pulse 65   Resp 16   Ht 5\' 11"  (1.803 m)   Wt 197 lb (89.4 kg)   SpO2 97%   BMI 27.48 kg/m       Objective:   Physical Exam Constitutional:      General: He is not in acute distress.    Appearance: He is well-developed.  HENT:     Head: Normocephalic and atraumatic.  Cardiovascular:     Rate and Rhythm: Normal rate and regular rhythm.     Heart sounds: No murmur heard.   Pulmonary:     Effort: Pulmonary effort is normal. No respiratory distress.     Breath sounds: Normal breath sounds. No wheezing or rales.  Skin:    General: Skin is warm and dry.  Neurological:     Mental Status: He is alert and oriented to person, place, and time.  Psychiatric:        Behavior: Behavior normal.  Thought Content: Thought content normal.             Assessment & Plan:  HTN- bp stable on current dose of amlodipine. Continue same. Obtain follow up bmet.  Hyperlipidemia- obtain follow up lipid panel.  BPH- urinating without difficulty- no longer needing flomax.    ED- good response to viagra.  Continue prn.  Kappa light chain Myeloma- clinically stable. Management per oncology.   Flu shot today. States he completed covid shot in March- he will bring [place] a copy of his card. Recommended that he wait 2 weeks and then get covid booster.   This visit occurred during the SARS-CoV-2 public health emergency.  Safety protocols were in place, including screening questions prior to the visit, additional usage of staff PPE, and extensive cleaning of exam room while observing appropriate contact time as indicated for disinfecting solutions.

## 2020-07-31 NOTE — Patient Instructions (Signed)
Please complete lab work prior to leaving.   

## 2020-08-01 LAB — BASIC METABOLIC PANEL
BUN: 13 mg/dL (ref 7–25)
CO2: 29 mmol/L (ref 20–32)
Calcium: 8.7 mg/dL (ref 8.6–10.3)
Chloride: 103 mmol/L (ref 98–110)
Creat: 1.29 mg/dL (ref 0.70–1.33)
Glucose, Bld: 78 mg/dL (ref 65–99)
Potassium: 4 mmol/L (ref 3.5–5.3)
Sodium: 137 mmol/L (ref 135–146)

## 2020-08-01 LAB — LIPID PANEL
Cholesterol: 138 mg/dL (ref <200)
HDL: 32 mg/dL — ABNORMAL LOW (ref >=40)
LDL Cholesterol (Calc): 82 mg/dL (calc)
Non-HDL Cholesterol (Calc): 106 mg/dL (calc) (ref <130)
Total CHOL/HDL Ratio: 4.3 (calc) (ref <5.0)
Triglycerides: 141 mg/dL (ref <150)

## 2020-08-04 ENCOUNTER — Other Ambulatory Visit: Payer: Self-pay | Admitting: Hematology & Oncology

## 2020-08-04 DIAGNOSIS — C9000 Multiple myeloma not having achieved remission: Secondary | ICD-10-CM

## 2020-08-08 ENCOUNTER — Other Ambulatory Visit: Payer: Self-pay | Admitting: *Deleted

## 2020-08-08 DIAGNOSIS — C9000 Multiple myeloma not having achieved remission: Secondary | ICD-10-CM

## 2020-08-08 MED ORDER — LENALIDOMIDE 10 MG PO CAPS: ORAL_CAPSULE | ORAL | 0 refills | Status: DC

## 2020-08-14 ENCOUNTER — Inpatient Hospital Stay: Payer: 59

## 2020-08-14 ENCOUNTER — Inpatient Hospital Stay: Payer: 59 | Admitting: Hematology & Oncology

## 2020-09-04 ENCOUNTER — Other Ambulatory Visit: Payer: Self-pay | Admitting: Hematology & Oncology

## 2020-09-04 DIAGNOSIS — C9000 Multiple myeloma not having achieved remission: Secondary | ICD-10-CM

## 2020-09-05 ENCOUNTER — Other Ambulatory Visit: Payer: Self-pay | Admitting: Hematology & Oncology

## 2020-09-05 DIAGNOSIS — C9000 Multiple myeloma not having achieved remission: Secondary | ICD-10-CM

## 2020-09-06 ENCOUNTER — Inpatient Hospital Stay: Payer: 59

## 2020-09-06 ENCOUNTER — Inpatient Hospital Stay: Payer: 59 | Admitting: Family

## 2020-09-10 ENCOUNTER — Other Ambulatory Visit: Payer: Self-pay | Admitting: *Deleted

## 2020-09-10 DIAGNOSIS — C9000 Multiple myeloma not having achieved remission: Secondary | ICD-10-CM

## 2020-09-10 MED ORDER — LENALIDOMIDE 10 MG PO CAPS: ORAL_CAPSULE | ORAL | 0 refills | Status: DC

## 2020-09-19 ENCOUNTER — Telehealth: Payer: Self-pay | Admitting: Family

## 2020-09-19 NOTE — Telephone Encounter (Signed)
Provider  Schedule change 11/12 updated appointments/Letter was mailed

## 2020-10-03 ENCOUNTER — Other Ambulatory Visit: Payer: Self-pay | Admitting: *Deleted

## 2020-10-03 DIAGNOSIS — C9000 Multiple myeloma not having achieved remission: Secondary | ICD-10-CM

## 2020-10-03 MED ORDER — LENALIDOMIDE 10 MG PO CAPS: ORAL_CAPSULE | ORAL | 0 refills | Status: DC

## 2020-10-04 ENCOUNTER — Ambulatory Visit: Payer: 59

## 2020-10-04 ENCOUNTER — Other Ambulatory Visit (HOSPITAL_BASED_OUTPATIENT_CLINIC_OR_DEPARTMENT_OTHER): Payer: Self-pay | Admitting: Nephrology

## 2020-10-04 ENCOUNTER — Other Ambulatory Visit: Payer: 59

## 2020-10-04 ENCOUNTER — Ambulatory Visit: Payer: 59 | Admitting: Family

## 2020-10-04 MED FILL — AMLODIPINE BESYLATE 10 MG T: 10 | 90 days supply | Qty: 90 | Fill #0

## 2020-10-08 ENCOUNTER — Telehealth: Payer: Self-pay

## 2020-10-08 MED FILL — OMEGA-3-ACID ETHYL ESTERS 1: 1 | 8 days supply | Qty: 30 | Fill #1

## 2020-10-08 MED FILL — SILDENAFIL CITRATE 100 MG T: 100 | 4 days supply | Qty: 4 | Fill #1

## 2020-10-08 NOTE — Telephone Encounter (Signed)
Returned pts call to r/s his 10/09/20 appts per his req... AOM

## 2020-10-09 ENCOUNTER — Inpatient Hospital Stay: Payer: 59 | Admitting: Family

## 2020-10-09 ENCOUNTER — Inpatient Hospital Stay: Payer: 59

## 2020-11-08 ENCOUNTER — Inpatient Hospital Stay: Payer: 59 | Attending: Hematology & Oncology

## 2020-11-08 ENCOUNTER — Inpatient Hospital Stay: Payer: 59

## 2020-11-08 ENCOUNTER — Inpatient Hospital Stay (HOSPITAL_BASED_OUTPATIENT_CLINIC_OR_DEPARTMENT_OTHER): Payer: 59 | Admitting: Family

## 2020-11-08 ENCOUNTER — Other Ambulatory Visit: Payer: Self-pay

## 2020-11-08 ENCOUNTER — Other Ambulatory Visit: Payer: Self-pay | Admitting: Family

## 2020-11-08 ENCOUNTER — Encounter: Payer: Self-pay | Admitting: Family

## 2020-11-08 VITALS — BP 126/67 | HR 75 | Temp 98.5°F | Resp 18 | Ht 71.0 in | Wt 201.0 lb

## 2020-11-08 DIAGNOSIS — C9000 Multiple myeloma not having achieved remission: Secondary | ICD-10-CM

## 2020-11-08 LAB — CMP (CANCER CENTER ONLY)
ALT: 16 U/L (ref 0–44)
AST: 16 U/L (ref 15–41)
Albumin: 4 g/dL (ref 3.5–5.0)
Alkaline Phosphatase: 50 U/L (ref 38–126)
Anion gap: 9 (ref 5–15)
BUN: 12 mg/dL (ref 6–20)
CO2: 24 mmol/L (ref 22–32)
Calcium: 9.3 mg/dL (ref 8.9–10.3)
Chloride: 102 mmol/L (ref 98–111)
Creatinine: 1.23 mg/dL (ref 0.61–1.24)
GFR, Estimated: >60 mL/min (ref >60)
Glucose, Bld: 102 mg/dL — ABNORMAL HIGH (ref 70–99)
Potassium: 3.7 mmol/L (ref 3.5–5.1)
Sodium: 135 mmol/L (ref 135–145)
Total Bilirubin: 0.5 mg/dL (ref 0.3–1.2)
Total Protein: 6.8 g/dL (ref 6.5–8.1)

## 2020-11-08 LAB — CBC WITH DIFFERENTIAL (CANCER CENTER ONLY)
Abs Immature Granulocytes: 0.02 10*3/uL (ref 0.00–0.07)
Basophils Absolute: 0 10*3/uL (ref 0.0–0.1)
Basophils Relative: 1 %
Eosinophils Absolute: 0.2 10*3/uL (ref 0.0–0.5)
Eosinophils Relative: 4 %
HCT: 38.3 % — ABNORMAL LOW (ref 39.0–52.0)
Hemoglobin: 13.3 g/dL (ref 13.0–17.0)
Immature Granulocytes: 1 %
Lymphocytes Relative: 29 %
Lymphs Abs: 1.2 10*3/uL (ref 0.7–4.0)
MCH: 31.8 pg (ref 26.0–34.0)
MCHC: 34.7 g/dL (ref 30.0–36.0)
MCV: 91.6 fL (ref 80.0–100.0)
Monocytes Absolute: 0.3 10*3/uL (ref 0.1–1.0)
Monocytes Relative: 8 %
Neutro Abs: 2.4 10*3/uL (ref 1.7–7.7)
Neutrophils Relative %: 57 %
Platelet Count: 157 10*3/uL (ref 150–400)
RBC: 4.18 MIL/uL — ABNORMAL LOW (ref 4.22–5.81)
RDW: 13.3 % (ref 11.5–15.5)
WBC Count: 4 10*3/uL (ref 4.0–10.5)
nRBC: 0 % (ref 0.0–0.2)

## 2020-11-08 MED ORDER — DENOSUMAB 120 MG/1.7ML ~~LOC~~ SOLN
120.0000 mg | Freq: Once | SUBCUTANEOUS | Status: AC
  Administered 2020-11-08: 17:00:00 120 mg via SUBCUTANEOUS

## 2020-11-08 MED ORDER — DENOSUMAB 120 MG/1.7ML ~~LOC~~ SOLN
SUBCUTANEOUS | Status: AC
  Filled 2020-11-08: qty 1.7
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
## 2020-11-08 NOTE — Patient Instructions (Signed)
Denosumab injection What is this medicine? DENOSUMAB (den oh sue mab) slows bone breakdown. Prolia is used to treat osteoporosis in women after menopause and in men, and in people who are taking corticosteroids for 6 months or more. Xgeva is used to treat a high calcium level due to cancer and to prevent bone fractures and other bone problems caused by multiple myeloma or cancer bone metastases. Xgeva is also used to treat giant cell tumor of the bone. This medicine may be used for other purposes; ask your health care provider or pharmacist if you have questions. COMMON BRAND NAME(S): Prolia, XGEVA What should I tell my health care provider before I take this medicine? They need to know if you have any of these conditions:  dental disease  having surgery or tooth extraction  infection  kidney disease  low levels of calcium or Vitamin D in the blood  malnutrition  on hemodialysis  skin conditions or sensitivity  thyroid or parathyroid disease  an unusual reaction to denosumab, other medicines, foods, dyes, or preservatives  pregnant or trying to get pregnant  breast-feeding How should I use this medicine? This medicine is for injection under the skin. It is given by a health care professional in a hospital or clinic setting. A special MedGuide will be given to you before each treatment. Be sure to read this information carefully each time. For Prolia, talk to your pediatrician regarding the use of this medicine in children. Special care may be needed. For Xgeva, talk to your pediatrician regarding the use of this medicine in children. While this drug may be prescribed for children as young as 13 years for selected conditions, precautions do apply. Overdosage: If you think you have taken too much of this medicine contact a poison control center or emergency room at once. NOTE: This medicine is only for you. Do not share this medicine with others. What if I miss a dose? It is  important not to miss your dose. Call your doctor or health care professional if you are unable to keep an appointment. What may interact with this medicine? Do not take this medicine with any of the following medications:  other medicines containing denosumab This medicine may also interact with the following medications:  medicines that lower your chance of fighting infection  steroid medicines like prednisone or cortisone This list may not describe all possible interactions. Give your health care provider a list of all the medicines, herbs, non-prescription drugs, or dietary supplements you use. Also tell them if you smoke, drink alcohol, or use illegal drugs. Some items may interact with your medicine. What should I watch for while using this medicine? Visit your doctor or health care professional for regular checks on your progress. Your doctor or health care professional may order blood tests and other tests to see how you are doing. Call your doctor or health care professional for advice if you get a fever, chills or sore throat, or other symptoms of a cold or flu. Do not treat yourself. This drug may decrease your body's ability to fight infection. Try to avoid being around people who are sick. You should make sure you get enough calcium and vitamin D while you are taking this medicine, unless your doctor tells you not to. Discuss the foods you eat and the vitamins you take with your health care professional. See your dentist regularly. Brush and floss your teeth as directed. Before you have any dental work done, tell your dentist you are   receiving this medicine. Do not become pregnant while taking this medicine or for 5 months after stopping it. Talk with your doctor or health care professional about your birth control options while taking this medicine. Women should inform their doctor if they wish to become pregnant or think they might be pregnant. There is a potential for serious side  effects to an unborn child. Talk to your health care professional or pharmacist for more information. What side effects may I notice from receiving this medicine? Side effects that you should report to your doctor or health care professional as soon as possible:  allergic reactions like skin rash, itching or hives, swelling of the face, lips, or tongue  bone pain  breathing problems  dizziness  jaw pain, especially after dental work  redness, blistering, peeling of the skin  signs and symptoms of infection like fever or chills; cough; sore throat; pain or trouble passing urine  signs of low calcium like fast heartbeat, muscle cramps or muscle pain; pain, tingling, numbness in the hands or feet; seizures  unusual bleeding or bruising  unusually weak or tired Side effects that usually do not require medical attention (report to your doctor or health care professional if they continue or are bothersome):  constipation  diarrhea  headache  joint pain  loss of appetite  muscle pain  runny nose  tiredness  upset stomach This list may not describe all possible side effects. Call your doctor for medical advice about side effects. You may report side effects to FDA at 1-800-FDA-1088. Where should I keep my medicine? This medicine is only given in a clinic, doctor's office, or other health care setting and will not be stored at home. NOTE: This sheet is a summary. It may not cover all possible information. If you have questions about this medicine, talk to your doctor, pharmacist, or health care provider.  2020 Elsevier/Gold Standard (2018-03-18 16:10:44)

## 2020-11-09 LAB — IGG, IGA, IGM
IgA: 16 mg/dL — ABNORMAL LOW (ref 90–386)
IgG (Immunoglobin G), Serum: 1648 mg/dL — ABNORMAL HIGH (ref 603–1613)
IgM (Immunoglobulin M), Srm: 16 mg/dL — ABNORMAL LOW (ref 20–172)

## 2020-11-11 ENCOUNTER — Telehealth: Payer: Self-pay

## 2020-11-11 LAB — PROTEIN ELECTROPHORESIS, SERUM, WITH REFLEX
A/G Ratio: 1.2 (ref 0.7–1.7)
Albumin ELP: 3.5 g/dL (ref 2.9–4.4)
Alpha-1-Globulin: 0.2 g/dL (ref 0.0–0.4)
Alpha-2-Globulin: 0.5 g/dL (ref 0.4–1.0)
Beta Globulin: 0.7 g/dL (ref 0.7–1.3)
Gamma Globulin: 1.5 g/dL (ref 0.4–1.8)
Globulin, Total: 2.9 g/dL (ref 2.2–3.9)
Total Protein ELP: 6.4 g/dL (ref 6.0–8.5)

## 2020-11-11 LAB — KAPPA/LAMBDA LIGHT CHAINS
Kappa free light chain: 23.8 mg/L — ABNORMAL HIGH (ref 3.3–19.4)
Kappa, lambda light chain ratio: 1.5 (ref 0.26–1.65)
Lambda free light chains: 15.9 mg/L (ref 5.7–26.3)

## 2020-11-11 NOTE — Telephone Encounter (Signed)
S/w pt per 11/08/20 los and he is aware of his appts in June 2022/// AOM

## 2020-11-12 ENCOUNTER — Other Ambulatory Visit: Payer: Self-pay | Admitting: *Deleted

## 2020-11-12 DIAGNOSIS — C9000 Multiple myeloma not having achieved remission: Secondary | ICD-10-CM

## 2020-11-12 MED ORDER — LENALIDOMIDE 10 MG PO CAPS: ORAL_CAPSULE | ORAL | 0 refills | Status: DC

## 2020-11-19 ENCOUNTER — Other Ambulatory Visit: Payer: Self-pay | Admitting: Hematology & Oncology

## 2020-11-19 DIAGNOSIS — C9000 Multiple myeloma not having achieved remission: Secondary | ICD-10-CM

## 2020-12-10 ENCOUNTER — Other Ambulatory Visit: Payer: Self-pay | Admitting: *Deleted

## 2020-12-10 DIAGNOSIS — C9000 Multiple myeloma not having achieved remission: Secondary | ICD-10-CM

## 2020-12-10 MED ORDER — LENALIDOMIDE 10 MG PO CAPS: ORAL_CAPSULE | ORAL | 0 refills | Status: DC

## 2020-12-11 MED FILL — OMEGA-3-ACID ETHYL ESTERS 1: 1 | 8 days supply | Qty: 30 | Fill #2

## 2020-12-11 MED FILL — SILDENAFIL CITRATE 100 MG T: 100 | 3 days supply | Qty: 3 | Fill #2

## 2020-12-28 ENCOUNTER — Emergency Department (HOSPITAL_COMMUNITY): Payer: 59

## 2020-12-28 ENCOUNTER — Emergency Department (HOSPITAL_COMMUNITY)
Admission: EM | Admit: 2020-12-28 | Discharge: 2020-12-28 | Disposition: A | Discharge status: Home / Self Care | Payer: 59 | Attending: Emergency Medicine | Admitting: Emergency Medicine

## 2020-12-28 ENCOUNTER — Other Ambulatory Visit: Payer: Self-pay

## 2020-12-28 ENCOUNTER — Encounter (HOSPITAL_COMMUNITY): Payer: Self-pay

## 2020-12-28 DIAGNOSIS — Z7982 Long term (current) use of aspirin: Secondary | ICD-10-CM | POA: Insufficient documentation

## 2020-12-28 DIAGNOSIS — M19012 Primary osteoarthritis, left shoulder: Secondary | ICD-10-CM | POA: Insufficient documentation

## 2020-12-28 DIAGNOSIS — Z79899 Other long term (current) drug therapy: Secondary | ICD-10-CM | POA: Insufficient documentation

## 2020-12-28 DIAGNOSIS — I10 Essential (primary) hypertension: Secondary | ICD-10-CM | POA: Insufficient documentation

## 2020-12-28 DIAGNOSIS — M6281 Muscle weakness (generalized): Secondary | ICD-10-CM | POA: Diagnosis present

## 2020-12-28 DIAGNOSIS — Y92410 Unspecified street and highway as the place of occurrence of the external cause: Secondary | ICD-10-CM | POA: Diagnosis not present

## 2020-12-28 DIAGNOSIS — R202 Paresthesia of skin: Secondary | ICD-10-CM

## 2020-12-28 LAB — CBC WITH DIFFERENTIAL/PLATELET
Abs Immature Granulocytes: 0.01 10*3/uL (ref 0.00–0.07)
Basophils Absolute: 0.1 10*3/uL (ref 0.0–0.1)
Basophils Relative: 2 %
Eosinophils Absolute: 0.1 10*3/uL (ref 0.0–0.5)
Eosinophils Relative: 1 %
HCT: 40.4 % (ref 39.0–52.0)
Hemoglobin: 14.3 g/dL (ref 13.0–17.0)
Immature Granulocytes: 0 %
Lymphocytes Relative: 33 %
Lymphs Abs: 1.5 10*3/uL (ref 0.7–4.0)
MCH: 32.7 pg (ref 26.0–34.0)
MCHC: 35.4 g/dL (ref 30.0–36.0)
MCV: 92.4 fL (ref 80.0–100.0)
Monocytes Absolute: 0.4 10*3/uL (ref 0.1–1.0)
Monocytes Relative: 9 %
Neutro Abs: 2.4 10*3/uL (ref 1.7–7.7)
Neutrophils Relative %: 55 %
Platelets: 227 10*3/uL (ref 150–400)
RBC: 4.37 MIL/uL (ref 4.22–5.81)
RDW: 13.6 % (ref 11.5–15.5)
WBC: 4.4 10*3/uL (ref 4.0–10.5)
nRBC: 0 % (ref 0.0–0.2)

## 2020-12-28 LAB — COMPREHENSIVE METABOLIC PANEL
ALT: 19 U/L (ref 0–44)
AST: 18 U/L (ref 15–41)
Albumin: 4 g/dL (ref 3.5–5.0)
Alkaline Phosphatase: 34 U/L — ABNORMAL LOW (ref 38–126)
Anion gap: 10 (ref 5–15)
BUN: 13 mg/dL (ref 6–20)
CO2: 24 mmol/L (ref 22–32)
Calcium: 8.8 mg/dL — ABNORMAL LOW (ref 8.9–10.3)
Chloride: 104 mmol/L (ref 98–111)
Creatinine, Ser: 1.33 mg/dL — ABNORMAL HIGH (ref 0.61–1.24)
GFR, Estimated: >60 mL/min (ref >60)
Glucose, Bld: 93 mg/dL (ref 70–99)
Potassium: 3.6 mmol/L (ref 3.5–5.1)
Sodium: 138 mmol/L (ref 135–145)
Total Bilirubin: 1.1 mg/dL (ref 0.3–1.2)
Total Protein: 6.8 g/dL (ref 6.5–8.1)

## 2020-12-28 LAB — ETHANOL: Alcohol, Ethyl (B): <10 mg/dL (ref <10)

## 2020-12-28 NOTE — ED Notes (Signed)
Pt states strength in arm feels like it is coming back and is able to move arm more than when he arrived
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
## 2020-12-28 NOTE — ED Notes (Signed)
Patient transported to CT 
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
## 2020-12-28 NOTE — Discharge Instructions (Signed)
If you develop new or worsening numbness, weakness, tingling, or any other new/concerning symptoms then return to the ER or call 911.

## 2020-12-28 NOTE — ED Notes (Signed)
CBG 88, not crossing into Epic
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
## 2020-12-30 LAB — CBG MONITORING, ED: Glucose-Capillary: 88 mg/dL (ref 70–99)

## 2021-01-09 ENCOUNTER — Telehealth: Payer: Self-pay | Admitting: *Deleted

## 2021-01-09 NOTE — Telephone Encounter (Signed)
Received a call from Meyer Cory from [employer].  Asking whether Jimmy Day should be every 3 months or every 6.  Dr Marin Olp states every 3 months per care plan.

## 2021-01-10 ENCOUNTER — Other Ambulatory Visit: Payer: Self-pay | Admitting: Family

## 2021-01-10 ENCOUNTER — Other Ambulatory Visit: Payer: Self-pay

## 2021-01-10 ENCOUNTER — Ambulatory Visit: Payer: 59 | Admitting: Family

## 2021-01-10 VITALS — BP 134/76 | HR 71 | Temp 98.6°F | Resp 16 | Ht 71.0 in | Wt 203.0 lb

## 2021-01-10 DIAGNOSIS — M5412 Radiculopathy, cervical region: Secondary | ICD-10-CM | POA: Diagnosis not present

## 2021-01-10 MED ORDER — METHYLPREDNISOLONE 4 MG PO TBPK: ORAL_TABLET | ORAL | 0 refills | Status: DC

## 2021-01-10 MED FILL — methylPREDNISolone 4 MG dos: 4 | 6 days supply | Qty: 21 | Fill #0

## 2021-01-10 MED FILL — SILDENAFIL CITRATE 100 MG T: 100 | 2 days supply | Qty: 2 | Fill #3

## 2021-01-10 MED FILL — AMLODIPINE BESYLATE 10 MG T: 10 | 90 days supply | Qty: 90 | Fill #1

## 2021-01-10 MED FILL — OMEGA-3-ACID ETHYL ESTERS 1: 1 | 16 days supply | Qty: 60 | Fill #3
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
## 2021-01-10 NOTE — Patient Instructions (Signed)
Please start medrol pak. Call if symptoms worsen or if symptoms fail to improve.

## 2021-01-16 ENCOUNTER — Other Ambulatory Visit: Payer: Self-pay | Admitting: *Deleted

## 2021-01-16 DIAGNOSIS — C9000 Multiple myeloma not having achieved remission: Secondary | ICD-10-CM

## 2021-01-16 MED ORDER — LENALIDOMIDE 10 MG PO CAPS: ORAL_CAPSULE | ORAL | 0 refills | Status: DC

## 2021-02-06 ENCOUNTER — Other Ambulatory Visit: Payer: Self-pay | Admitting: *Deleted

## 2021-02-06 DIAGNOSIS — C9000 Multiple myeloma not having achieved remission: Secondary | ICD-10-CM

## 2021-02-07 ENCOUNTER — Ambulatory Visit: Payer: 59 | Admitting: Family

## 2021-02-07 ENCOUNTER — Other Ambulatory Visit: Payer: Self-pay

## 2021-02-07 ENCOUNTER — Other Ambulatory Visit: Payer: Self-pay | Admitting: Family

## 2021-02-07 ENCOUNTER — Inpatient Hospital Stay: Payer: 59

## 2021-02-07 ENCOUNTER — Encounter: Payer: Self-pay | Admitting: Family

## 2021-02-07 ENCOUNTER — Inpatient Hospital Stay: Payer: 59 | Attending: Hematology & Oncology

## 2021-02-07 ENCOUNTER — Ambulatory Visit: Payer: 59 | Attending: Internal Medicine

## 2021-02-07 VITALS — BP 122/78 | HR 59 | Temp 98.4°F | Resp 16 | Ht 71.0 in | Wt 202.0 lb

## 2021-02-07 DIAGNOSIS — C9000 Multiple myeloma not having achieved remission: Secondary | ICD-10-CM | POA: Diagnosis not present

## 2021-02-07 DIAGNOSIS — M5412 Radiculopathy, cervical region: Secondary | ICD-10-CM | POA: Diagnosis not present

## 2021-02-07 DIAGNOSIS — Z23 Encounter for immunization: Secondary | ICD-10-CM

## 2021-02-07 LAB — CMP (CANCER CENTER ONLY)
ALT: 19 U/L (ref 0–44)
AST: 15 U/L (ref 15–41)
Albumin: 4.4 g/dL (ref 3.5–5.0)
Alkaline Phosphatase: 29 U/L — ABNORMAL LOW (ref 38–126)
Anion gap: 5 (ref 5–15)
BUN: 12 mg/dL (ref 6–20)
CO2: 31 mmol/L (ref 22–32)
Calcium: 9.6 mg/dL (ref 8.9–10.3)
Chloride: 102 mmol/L (ref 98–111)
Creatinine: 1.33 mg/dL — ABNORMAL HIGH (ref 0.61–1.24)
GFR, Estimated: >60 mL/min (ref >60)
Glucose, Bld: 103 mg/dL — ABNORMAL HIGH (ref 70–99)
Potassium: 4.2 mmol/L (ref 3.5–5.1)
Sodium: 138 mmol/L (ref 135–145)
Total Bilirubin: 0.9 mg/dL (ref 0.3–1.2)
Total Protein: 7.2 g/dL (ref 6.5–8.1)

## 2021-02-07 LAB — CBC WITH DIFFERENTIAL (CANCER CENTER ONLY)
Abs Immature Granulocytes: 0.01 10*3/uL (ref 0.00–0.07)
Basophils Absolute: 0.1 10*3/uL (ref 0.0–0.1)
Basophils Relative: 2 %
Eosinophils Absolute: 0.2 10*3/uL (ref 0.0–0.5)
Eosinophils Relative: 4 %
HCT: 42.1 % (ref 39.0–52.0)
Hemoglobin: 15 g/dL (ref 13.0–17.0)
Immature Granulocytes: 0 %
Lymphocytes Relative: 44 %
Lymphs Abs: 1.5 10*3/uL (ref 0.7–4.0)
MCH: 32.3 pg (ref 26.0–34.0)
MCHC: 35.6 g/dL (ref 30.0–36.0)
MCV: 90.5 fL (ref 80.0–100.0)
Monocytes Absolute: 0.4 10*3/uL (ref 0.1–1.0)
Monocytes Relative: 10 %
Neutro Abs: 1.4 10*3/uL — ABNORMAL LOW (ref 1.7–7.7)
Neutrophils Relative %: 40 %
Platelet Count: 213 10*3/uL (ref 150–400)
RBC: 4.65 MIL/uL (ref 4.22–5.81)
RDW: 13.2 % (ref 11.5–15.5)
WBC Count: 3.5 10*3/uL — ABNORMAL LOW (ref 4.0–10.5)
nRBC: 0 % (ref 0.0–0.2)

## 2021-02-07 MED ORDER — DENOSUMAB 120 MG/1.7ML ~~LOC~~ SOLN
120.0000 mg | Freq: Once | SUBCUTANEOUS | Status: AC
  Administered 2021-02-07: 120 mg via SUBCUTANEOUS

## 2021-02-07 MED ORDER — CYCLOBENZAPRINE HCL 5 MG PO TABS: 5.0000 mg | ORAL_TABLET | Freq: Three times a day (TID) | ORAL | 0 refills | Status: DC | PRN

## 2021-02-07 MED ORDER — DENOSUMAB 120 MG/1.7ML ~~LOC~~ SOLN
SUBCUTANEOUS | Status: AC
  Filled 2021-02-07: qty 1.7

## 2021-02-07 NOTE — Progress Notes (Signed)
   Covid-19 Vaccination Clinic  Name:  Jimmy Day    MRN: [id number] DOB: [birth year]  02/07/2021  [name] was observed post Covid-19 immunization for 15 minutes without incident. He was provided with Vaccine Information Sheet and instruction to access the V-Safe system.   [name] was instructed to call 911 with any severe reactions post vaccine: Marland Kitchen Difficulty breathing  . Swelling of face and throat  . A fast heartbeat  . A bad rash all over body  . Dizziness and weakness   Immunizations Administered    Name Date Dose VIS Date Route   PFIZER Comrnaty(Gray TOP) Covid-19 Vaccine 02/07/2021 11:29 AM 0.3 mL 10/31/2020 Intramuscular   Manufacturer: [employer], [employer]   Lot: [id number]   [place]: [phone]

## 2021-02-07 NOTE — Patient Instructions (Signed)
You may use flexeril every 8 hrs as needed.   Apply salon pas patch as needed for pain as well as tylenol as needed. You should be contacted about scheduling your physical therapy.
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
## 2021-02-07 NOTE — Patient Instructions (Signed)
Denosumab injection What is this medicine? DENOSUMAB (den oh sue mab) slows bone breakdown. Prolia is used to treat osteoporosis in women after menopause and in men, and in people who are taking corticosteroids for 6 months or more. Xgeva is used to treat a high calcium level due to cancer and to prevent bone fractures and other bone problems caused by multiple myeloma or cancer bone metastases. Xgeva is also used to treat giant cell tumor of the bone. This medicine may be used for other purposes; ask your health care provider or pharmacist if you have questions. COMMON BRAND NAME(S): Prolia, XGEVA What should I tell my health care provider before I take this medicine? They need to know if you have any of these conditions:  dental disease  having surgery or tooth extraction  infection  kidney disease  low levels of calcium or Vitamin D in the blood  malnutrition  on hemodialysis  skin conditions or sensitivity  thyroid or parathyroid disease  an unusual reaction to denosumab, other medicines, foods, dyes, or preservatives  pregnant or trying to get pregnant  breast-feeding How should I use this medicine? This medicine is for injection under the skin. It is given by a health care professional in a hospital or clinic setting. A special MedGuide will be given to you before each treatment. Be sure to read this information carefully each time. For Prolia, talk to your pediatrician regarding the use of this medicine in children. Special care may be needed. For Xgeva, talk to your pediatrician regarding the use of this medicine in children. While this drug may be prescribed for children as young as 13 years for selected conditions, precautions do apply. Overdosage: If you think you have taken too much of this medicine contact a poison control center or emergency room at once. NOTE: This medicine is only for you. Do not share this medicine with others. What if I miss a dose? It is  important not to miss your dose. Call your doctor or health care professional if you are unable to keep an appointment. What may interact with this medicine? Do not take this medicine with any of the following medications:  other medicines containing denosumab This medicine may also interact with the following medications:  medicines that lower your chance of fighting infection  steroid medicines like prednisone or cortisone This list may not describe all possible interactions. Give your health care provider a list of all the medicines, herbs, non-prescription drugs, or dietary supplements you use. Also tell them if you smoke, drink alcohol, or use illegal drugs. Some items may interact with your medicine. What should I watch for while using this medicine? Visit your doctor or health care professional for regular checks on your progress. Your doctor or health care professional may order blood tests and other tests to see how you are doing. Call your doctor or health care professional for advice if you get a fever, chills or sore throat, or other symptoms of a cold or flu. Do not treat yourself. This drug may decrease your body's ability to fight infection. Try to avoid being around people who are sick. You should make sure you get enough calcium and vitamin D while you are taking this medicine, unless your doctor tells you not to. Discuss the foods you eat and the vitamins you take with your health care professional. See your dentist regularly. Brush and floss your teeth as directed. Before you have any dental work done, tell your dentist you are   receiving this medicine. Do not become pregnant while taking this medicine or for 5 months after stopping it. Talk with your doctor or health care professional about your birth control options while taking this medicine. Women should inform their doctor if they wish to become pregnant or think they might be pregnant. There is a potential for serious side  effects to an unborn child. Talk to your health care professional or pharmacist for more information. What side effects may I notice from receiving this medicine? Side effects that you should report to your doctor or health care professional as soon as possible:  allergic reactions like skin rash, itching or hives, swelling of the face, lips, or tongue  bone pain  breathing problems  dizziness  jaw pain, especially after dental work  redness, blistering, peeling of the skin  signs and symptoms of infection like fever or chills; cough; sore throat; pain or trouble passing urine  signs of low calcium like fast heartbeat, muscle cramps or muscle pain; pain, tingling, numbness in the hands or feet; seizures  unusual bleeding or bruising  unusually weak or tired Side effects that usually do not require medical attention (report to your doctor or health care professional if they continue or are bothersome):  constipation  diarrhea  headache  joint pain  loss of appetite  muscle pain  runny nose  tiredness  upset stomach This list may not describe all possible side effects. Call your doctor for medical advice about side effects. You may report side effects to FDA at 1-800-FDA-1088. Where should I keep my medicine? This medicine is only given in a clinic, doctor's office, or other health care setting and will not be stored at home. NOTE: This sheet is a summary. It may not cover all possible information. If you have questions about this medicine, talk to your doctor, pharmacist, or health care provider.  2021 Elsevier/Gold Standard (2018-03-18 16:10:44)

## 2021-02-10 ENCOUNTER — Other Ambulatory Visit: Payer: Self-pay | Admitting: *Deleted

## 2021-02-10 DIAGNOSIS — C9000 Multiple myeloma not having achieved remission: Secondary | ICD-10-CM

## 2021-02-10 MED ORDER — LENALIDOMIDE 10 MG PO CAPS: ORAL_CAPSULE | ORAL | 0 refills | Status: DC

## 2021-02-13 ENCOUNTER — Telehealth: Payer: Self-pay | Admitting: *Deleted

## 2021-02-13 NOTE — Telephone Encounter (Signed)
Message received from Meyer Cory, Nurse Case Manager from [facility] wanting to know when pt last came in for [place] injection.  Call placed back to [facility] and message left to inform her that pt last received Xgeva on 02/07/21.  Instructed Moira to call office back with any further questions.

## 2021-02-24 ENCOUNTER — Telehealth: Payer: Self-pay | Admitting: Family

## 2021-02-24 NOTE — Telephone Encounter (Signed)
Caller Bronislaus Verdell  Call Back @ [phone]   Patient states he would like a call back in reference to coming in for a steroid injection for shoulder. Patient states his shoulder is bothering him, feels like needles sticking him  Please advise

## 2021-02-24 NOTE — Telephone Encounter (Signed)
Patient scheduled to come in tomorrow for ov and possible steroid shot

## 2021-02-25 ENCOUNTER — Other Ambulatory Visit: Payer: Self-pay

## 2021-02-25 ENCOUNTER — Other Ambulatory Visit (HOSPITAL_BASED_OUTPATIENT_CLINIC_OR_DEPARTMENT_OTHER): Payer: Self-pay

## 2021-02-25 ENCOUNTER — Ambulatory Visit: Payer: 59 | Admitting: Family

## 2021-02-25 ENCOUNTER — Other Ambulatory Visit: Payer: Self-pay | Admitting: Family

## 2021-02-25 ENCOUNTER — Ambulatory Visit: Payer: 59 | Attending: Family | Admitting: Physical Therapy

## 2021-02-25 ENCOUNTER — Encounter: Payer: Self-pay | Admitting: Physical Therapy

## 2021-02-25 VITALS — BP 136/88 | HR 69 | Temp 98.2°F | Resp 16 | Wt 208.0 lb

## 2021-02-25 DIAGNOSIS — M62838 Other muscle spasm: Secondary | ICD-10-CM | POA: Diagnosis present

## 2021-02-25 DIAGNOSIS — M5412 Radiculopathy, cervical region: Secondary | ICD-10-CM | POA: Diagnosis not present

## 2021-02-25 DIAGNOSIS — C9000 Multiple myeloma not having achieved remission: Secondary | ICD-10-CM

## 2021-02-25 DIAGNOSIS — M542 Cervicalgia: Secondary | ICD-10-CM | POA: Insufficient documentation

## 2021-02-25 DIAGNOSIS — R29898 Other symptoms and signs involving the musculoskeletal system: Secondary | ICD-10-CM | POA: Insufficient documentation

## 2021-02-25 MED ORDER — OMEGA-3-ACID ETHYL ESTERS 1 G PO CAPS
2.0000 | ORAL_CAPSULE | Freq: Two times a day (BID) | ORAL | 5 refills | Status: DC
  Filled 2021-02-25: qty 30, 8d supply, fill #0
  Filled 2021-04-28: qty 30, 8d supply, fill #1
  Filled 2021-06-02: qty 30, 8d supply, fill #2
  Filled 2021-11-12: qty 30, 8d supply, fill #3
  Filled 2021-12-09: qty 30, 8d supply, fill #4
  Filled 2022-01-13: qty 30, 8d supply, fill #5
  Filled 2022-02-18: qty 15, 4d supply, fill #6

## 2021-02-25 MED ORDER — TAMSULOSIN HCL 0.4 MG PO CAPS
ORAL_CAPSULE | ORAL | 10 refills | Status: DC
  Filled 2021-02-25: qty 30, 30d supply, fill #0
  Filled 2021-04-28: qty 30, 30d supply, fill #1
  Filled 2021-09-12: qty 30, 30d supply, fill #2
  Filled 2021-11-12: qty 30, 30d supply, fill #3
  Filled 2021-12-09: qty 30, 30d supply, fill #4
  Filled 2022-02-18: qty 30, 30d supply, fill #5

## 2021-02-25 MED ORDER — GABAPENTIN 100 MG PO CAPS
100.0000 mg | ORAL_CAPSULE | Freq: Three times a day (TID) | ORAL | 3 refills | Status: DC
  Filled 2021-02-25: qty 90, 30d supply, fill #0

## 2021-02-25 MED FILL — Sildenafil Citrate Tab 100 MG: ORAL | 4 days supply | Qty: 4 | Fill #0 | Status: AC
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
## 2021-02-25 NOTE — Patient Instructions (Signed)
Start gabapentin 100mg  three times daily for nerve pain. Add tylenol 650mg  twice daily for pain. We will work on getting your MRI approved and let you know.  Call if symptoms worse. If you develop leg weakness/falls, bowel/bladder incontinence please go to the ER.  Start physical therapy as scheduled.
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
## 2021-03-08 ENCOUNTER — Other Ambulatory Visit: Payer: Self-pay

## 2021-03-08 ENCOUNTER — Ambulatory Visit (HOSPITAL_BASED_OUTPATIENT_CLINIC_OR_DEPARTMENT_OTHER)
Admission: RE | Admit: 2021-03-08 | Discharge: 2021-03-08 | Disposition: A | Discharge status: Home / Self Care | Payer: 59 | Source: Ambulatory Visit | Attending: Family | Admitting: Family

## 2021-03-08 DIAGNOSIS — M5412 Radiculopathy, cervical region: Secondary | ICD-10-CM | POA: Diagnosis present

## 2021-03-08 DIAGNOSIS — C9000 Multiple myeloma not having achieved remission: Secondary | ICD-10-CM | POA: Diagnosis present

## 2021-03-10 ENCOUNTER — Telehealth: Payer: Self-pay | Admitting: Family

## 2021-03-10 ENCOUNTER — Other Ambulatory Visit: Payer: Self-pay | Admitting: *Deleted

## 2021-03-10 DIAGNOSIS — M5412 Radiculopathy, cervical region: Secondary | ICD-10-CM

## 2021-03-10 DIAGNOSIS — C9000 Multiple myeloma not having achieved remission: Secondary | ICD-10-CM

## 2021-03-10 MED ORDER — LENALIDOMIDE 10 MG PO CAPS: ORAL_CAPSULE | ORAL | 0 refills | Status: DC

## 2021-03-10 NOTE — Telephone Encounter (Signed)
Patient advised of results, referral,  provider's comments and recommendations. He verbalized understanding.

## 2021-03-10 NOTE — Telephone Encounter (Signed)
Please advise pt that I have reviewed his MRI. It notes some pinched nerves, bone spur, and degenerative changes.  I would like to refer him to neurosurgery. Referral has been placed. If he develops loss of bowel/bladder, leg weakness/falls please go to the ER.

## 2021-03-12 ENCOUNTER — Other Ambulatory Visit (HOSPITAL_BASED_OUTPATIENT_CLINIC_OR_DEPARTMENT_OTHER): Payer: Self-pay

## 2021-03-12 MED ORDER — LOSARTAN POTASSIUM 50 MG PO TABS
1.0000 | ORAL_TABLET | Freq: Every day | ORAL | 3 refills | Status: DC
  Filled 2021-03-12: qty 90, 90d supply, fill #0
  Filled 2021-06-02: qty 90, 90d supply, fill #1
  Filled 2021-09-12: qty 90, 90d supply, fill #2
  Filled 2021-12-09: qty 90, 90d supply, fill #3

## 2021-03-12 MED FILL — Sildenafil Citrate Tab 100 MG: ORAL | 4 days supply | Qty: 4 | Fill #1 | Status: AC

## 2021-03-13 ENCOUNTER — Other Ambulatory Visit: Payer: Self-pay | Admitting: Hematology & Oncology

## 2021-03-13 DIAGNOSIS — C9000 Multiple myeloma not having achieved remission: Secondary | ICD-10-CM

## 2021-03-14 ENCOUNTER — Ambulatory Visit: Payer: 59 | Admitting: Family

## 2021-03-17 ENCOUNTER — Ambulatory Visit: Payer: 59 | Admitting: Physical Therapy

## 2021-03-28 ENCOUNTER — Other Ambulatory Visit: Payer: Self-pay | Admitting: Neurosurgery

## 2021-03-28 ENCOUNTER — Other Ambulatory Visit (HOSPITAL_COMMUNITY): Payer: Self-pay

## 2021-04-07 ENCOUNTER — Encounter: Payer: 59 | Admitting: Physical Therapy

## 2021-04-15 ENCOUNTER — Other Ambulatory Visit: Payer: Self-pay | Admitting: Neurosurgery

## 2021-04-16 ENCOUNTER — Other Ambulatory Visit: Payer: Self-pay | Admitting: Hematology & Oncology

## 2021-04-16 DIAGNOSIS — C9000 Multiple myeloma not having achieved remission: Secondary | ICD-10-CM

## 2021-04-18 ENCOUNTER — Other Ambulatory Visit: Payer: Self-pay

## 2021-04-18 DIAGNOSIS — C9000 Multiple myeloma not having achieved remission: Secondary | ICD-10-CM

## 2021-04-18 MED ORDER — LENALIDOMIDE 10 MG PO CAPS: ORAL_CAPSULE | ORAL | 0 refills | Status: DC

## 2021-04-18 NOTE — Progress Notes (Signed)
Surgical Instructions    Your procedure is scheduled on Wednesday 04/30/2021.  Report to [facility] Main Entrance "A" at 12:15 P.M., then check in with the Admitting office.  Call this number if you have problems the morning of surgery:  [phone]   If you have any questions prior to your surgery date call [phone]: Open Monday-Friday 8am-4pm    Remember:  Do not eat and drink after midnight the night before your surgery    Take these medicines the morning of surgery with A SIP OF WATER: Acetaminophen (Tylenol) - if needed Brimonidine Tartrate (Lumify) Gabapentin (Neurontin) Lenalidomide (Revlimid) - if scheduled to take  As of today, STOP taking any Aspirin (unless otherwise instructed by your surgeon) Aleve, Naproxen, Ibuprofen, Motrin, Advil, Goody's, BC's, all herbal medications, fish oil, and all vitamins.  Follow your surgeon's instructions on when to stop Aspirin.  If no instructions were given by your surgeon then you will need to call the office to get those instructions.             Do not wear jewelry, make up, or nail polish Do not wear nail polish, gel polish, artificial nails, or any other type of covering on natural nails including finger and toenails.  If patients have artificial nails, gel coating, etc. that need to be removed by a nail salon please have this removed prior to surgery or surgery may need to be canceled/delayed if the surgeon/ anesthesia feels like the patient is unable to be adequately monitored. Do not wear lotions, powders, perfumes/colognes, or deodorant. Do not shave 48 hours prior to surgery.  Men may shave face and neck. Do not bring valuables to the hospital. [facility] is not responsible for any belongings or valuables.  Do NOT Smoke (Tobacco/Vaping) or drink Alcohol 24 hours prior to your procedure  If you use a CPAP at night, you may bring all equipment for your overnight stay.   Contacts, glasses, hearing aids, dentures or partials  may not be worn into surgery, please bring cases for these belongings   For patients admitted to the hospital, discharge time will be determined by your treatment team.   Patients discharged the day of surgery will not be allowed to drive home, and someone needs to stay with them for 24 hours.    Special instructions:    Oral Hygiene is also important to reduce your risk of infection.  Remember - BRUSH YOUR TEETH THE MORNING OF SURGERY WITH YOUR REGULAR TOOTHPASTE   Jimmy Day- Preparing For Surgery  Before surgery, you can play an important role. Because skin is not sterile, your skin needs to be as free of germs as possible. You can reduce the number of germs on your skin by washing with CHG (chlorahexidine gluconate) Soap before surgery.  CHG is an antiseptic cleaner which kills germs and bonds with the skin to continue killing germs even after washing.     Please do not use if you have an allergy to CHG or antibacterial soaps. If your skin becomes reddened/irritated stop using the CHG.  Do not shave (including legs and underarms) for at least 48 hours prior to first CHG shower. It is OK to shave your face.  Please follow these instructions carefully.    1.  Shower the NIGHT BEFORE SURGERY and the MORNING OF SURGERY with CHG Soap.   If you chose to wash your hair, wash your hair first as usual with your normal shampoo. After you shampoo, rinse your hair  and body thoroughly to remove the shampoo.  Then [employer] and genitals (private parts) with your normal soap and rinse thoroughly to remove soap.  2. After that Use CHG Soap as you would any other liquid soap. You can apply CHG directly to the skin and wash gently with a scrungie or a clean washcloth.   3. Apply the CHG Soap to your body ONLY FROM THE NECK DOWN.  Do not use on open wounds or open sores. Avoid contact with your eyes, ears, mouth and genitals (private parts). Wash Face and genitals (private parts)  with your normal  soap.   4. Wash thoroughly, paying special attention to the area where your surgery will be performed.  5. Thoroughly rinse your body with warm water from the neck down.  6. DO NOT shower/wash with your normal soap after using and rinsing off the CHG Soap.  7. Pat yourself dry with a CLEAN TOWEL.  8. Wear CLEAN PAJAMAS to bed the night before surgery  9. Place CLEAN SHEETS on your bed the night before your surgery  10. DO NOT SLEEP WITH PETS.   Day of Surgery:  Take a shower with CHG soap. Wear Clean/Comfortable clothing the morning of surgery Do not apply any deodorants/lotions.   Remember to brush your teeth WITH YOUR REGULAR TOOTHPASTE.   Please read over the following fact sheets that you were given.

## 2021-04-22 ENCOUNTER — Other Ambulatory Visit: Payer: Self-pay

## 2021-04-22 ENCOUNTER — Encounter (HOSPITAL_COMMUNITY)
Admission: RE | Admit: 2021-04-22 | Discharge: 2021-04-22 | Disposition: A | Discharge status: Home / Self Care | Payer: 59 | Source: Ambulatory Visit | Attending: Neurosurgery | Admitting: Neurosurgery

## 2021-04-22 ENCOUNTER — Encounter: Payer: 59 | Admitting: Physical Therapy

## 2021-04-22 ENCOUNTER — Encounter (HOSPITAL_COMMUNITY): Payer: Self-pay

## 2021-04-22 DIAGNOSIS — Z01812 Encounter for preprocedural laboratory examination: Secondary | ICD-10-CM | POA: Insufficient documentation

## 2021-04-22 HISTORY — DX: Pneumonia, unspecified organism: J18.9

## 2021-04-22 HISTORY — DX: Unspecified osteoarthritis, unspecified site: M19.90

## 2021-04-22 LAB — BASIC METABOLIC PANEL
Anion gap: 5 (ref 5–15)
BUN: 13 mg/dL (ref 6–20)
CO2: 27 mmol/L (ref 22–32)
Calcium: 8.7 mg/dL — ABNORMAL LOW (ref 8.9–10.3)
Chloride: 104 mmol/L (ref 98–111)
Creatinine, Ser: 1.37 mg/dL — ABNORMAL HIGH (ref 0.61–1.24)
GFR, Estimated: 59 mL/min — ABNORMAL LOW (ref >60)
Glucose, Bld: 104 mg/dL — ABNORMAL HIGH (ref 70–99)
Potassium: 4.2 mmol/L (ref 3.5–5.1)
Sodium: 136 mmol/L (ref 135–145)

## 2021-04-22 LAB — CBC
HCT: 44 % (ref 39.0–52.0)
Hemoglobin: 14.9 g/dL (ref 13.0–17.0)
MCH: 31.8 pg (ref 26.0–34.0)
MCHC: 33.9 g/dL (ref 30.0–36.0)
MCV: 93.8 fL (ref 80.0–100.0)
Platelets: 187 10*3/uL (ref 150–400)
RBC: 4.69 MIL/uL (ref 4.22–5.81)
RDW: 13.7 % (ref 11.5–15.5)
WBC: 3.4 10*3/uL — ABNORMAL LOW (ref 4.0–10.5)
nRBC: 0 % (ref 0.0–0.2)

## 2021-04-22 LAB — TYPE AND SCREEN
ABO/RH(D): O POS
Antibody Screen: NEGATIVE

## 2021-04-22 LAB — SURGICAL PCR SCREEN

## 2021-04-22 NOTE — Anesthesia Preprocedure Evaluation (Addendum)
Anesthesia Evaluation  Patient identified by MRN, date of birth, ID band Patient awake    Reviewed: Allergy & Precautions, NPO status , Patient's Chart, lab work & pertinent test results  Airway Mallampati: II  TM Distance: >3 FB Neck ROM: Limited    Dental no notable dental hx.    Pulmonary neg pulmonary ROS,    Pulmonary exam normal breath sounds clear to auscultation       Cardiovascular hypertension, Normal cardiovascular exam Rhythm:Regular Rate:Normal     Neuro/Psych negative neurological ROS  negative psych ROS   GI/Hepatic negative GI ROS, Neg liver ROS,   Endo/Other  negative endocrine ROS  Renal/GU negative Renal ROS  negative genitourinary   Musculoskeletal negative musculoskeletal ROS (+)   Abdominal   Peds negative pediatric ROS (+)  Hematology Multiple myeloma   Anesthesia Other Findings   Reproductive/Obstetrics negative OB ROS                            Anesthesia Physical Anesthesia Plan  ASA: III  Anesthesia Plan: General   Post-op Pain Management:    Induction: Intravenous  PONV Risk Score and Plan: 2 and Ondansetron, Dexamethasone and Treatment may vary due to age or medical condition  Airway Management Planned: Oral ETT  Additional Equipment:   Intra-op Plan:   Post-operative Plan: Extubation in OR  Informed Consent: I have reviewed the patients History and Physical, chart, labs and discussed the procedure including the risks, benefits and alternatives for the proposed anesthesia with the patient or authorized representative who has indicated his/her understanding and acceptance.     Dental advisory given  Plan Discussed with: CRNA and Surgeon  Anesthesia Plan Comments: (See APP note by Durel Salts, FNP )       Anesthesia Quick Evaluation

## 2021-04-22 NOTE — Progress Notes (Signed)
PCP - [name], NP Cardiologist - patient denies Oncology - Burney Gauze  PPM/ICD - n/a Device Orders -  Rep Notified -   Chest x-ray - 12/28/20 EKG - 12/28/20 Stress Test - patient denies  ECHO - 02/25/15 Cardiac Cath - patient denies   Sleep Study - patient denies, negative stop-bang CPAP -   Fasting Blood Sugar - n/a Checks Blood Sugar _____ times a day  Blood Thinner Instructions: n/a Aspirin Instructions: patient's last dose 04/22/21  ERAS Protcol - n/a, NPO order PRE-SURGERY Ensure or G2-   COVID TEST- scheduled for 04/28/21 at 1005   Anesthesia review: yes, history of echo  Patient denies shortness of breath, fever, cough and chest pain at PAT appointment   All instructions explained to the patient, with a verbal understanding of the material. Patient agrees to go over the instructions while at home for a better understanding. Patient also instructed to self quarantine after being tested for COVID-19. The opportunity to ask questions was provided.

## 2021-04-22 NOTE — Progress Notes (Signed)
Anesthesia Chart Review:   Case: [id number] Date/Time: 04/30/21 1402   Procedure: ACDF - C3-C4 - C4-C5 - C6-C7 (N/A )   Anesthesia type: General   Pre-op diagnosis: Cervical radiculopathy   Location: [facility] OR ROOM 21 / [facility] OR   Surgeons: Vallarie Mare, MD      DISCUSSION: Pt is 60 years old with hx HTN, multiple myeloma (s/p autologous stem cell transplant 2016)   VS: BP 136/87   Pulse 68   Temp (!) 36.1 C (Temporal)   Resp 20   Ht 6' (1.829 m)   Wt 91.9 kg   SpO2 98%   BMI 27.48 kg/m    PROVIDERS: - PCP is [name], NP - Last oncology visit with Laverna Peace, NP on 11/08/20   LABS: Labs reviewed: Acceptable for surgery. (all labs ordered are listed, but only abnormal results are displayed)  Labs Reviewed  SURGICAL PCR SCREEN - Abnormal; Notable for the following components:      Result Value   MRSA, PCR   (*)    Value: INVALID, UNABLE TO DETERMINE THE PRESENCE OF TARGET DUE TO SPECIMEN INTEGRITY. RECOLLECTION REQUESTED.   Staphylococcus aureus   (*)    Value: INVALID, UNABLE TO DETERMINE THE PRESENCE OF TARGET DUE TO SPECIMEN INTEGRITY. RECOLLECTION REQUESTED.   All other components within normal limits  BASIC METABOLIC PANEL - Abnormal; Notable for the following components:   Glucose, Bld 104 (*)    Creatinine, Ser 1.37 (*)    Calcium 8.7 (*)    GFR, Estimated 59 (*)    All other components within normal limits  CBC - Abnormal; Notable for the following components:   WBC 3.4 (*)    All other components within normal limits  TYPE AND SCREEN     IMAGES: 1 view CXR 12/28/20: No acute cardiopulmonary disease    EKG 12/28/20: Sinus rhythm. Borderline left axis deviation. Anteroseptal infarct, old   CV: Echo 02/25/15 (care everywhere - done as part of multiple myeloma care): - Normal LV systolic function with moderate LVH - Normal LA pressures with normal diastolic function - Normal RV systolic function - Trivial MR, mild PR, trivial TR - No  valvular stenosis   Past Medical History:  Diagnosis Date  . Arthritis    spine  . Elevated PSA 11/29/2017  . Fasting hyperglycemia    NORMAL A1c  . History of COVID-19 2020  . Hyperlipidemia   . Hypertension   . Kappa light chain myeloma ([place]) 02/02/2014  . Pneumonia    during covid in 2020  . Testosterone deficiency    Dr Hartley Barefoot    Past Surgical History:  Procedure Laterality Date  . CERVICAL PLATE INSERTION     POST COMPRESSION DISC INJURY  . LIPOMAS REMOVAL     BENIGN FROM CHEST AND BACK    MEDICATIONS: . acetaminophen (TYLENOL) 500 MG tablet  . amLODipine (NORVASC) 10 MG tablet  . aspirin 325 MG EC tablet  . Brimonidine Tartrate (LUMIFY) 0.025 % SOLN  . cholecalciferol (VITAMIN D3) 25 MCG (1000 UT) tablet  . cyclobenzaprine (FLEXERIL) 5 MG tablet  . gabapentin (NEURONTIN) 100 MG capsule  . GENERLAC 10 GM/15ML SOLN  . lenalidomide (REVLIMID) 10 MG capsule  . losartan (COZAAR) 50 MG tablet  . methylPREDNISolone (MEDROL DOSEPAK) 4 MG TBPK tablet  . omega-3 acid ethyl esters (LOVAZA) 1 g capsule  . sildenafil (VIAGRA) 100 MG tablet  . tamsulosin (FLOMAX) 0.4 MG CAPS capsule   No current facility-administered medications  for this encounter.   - Pt to hold Revlimid 7 days before surgery   If no changes, I anticipate pt can proceed with surgery as scheduled.   Willeen Cass, PhD, FNP-BC [facility]/Anesthesiology Phone: [phone] 04/22/2021 3:15 PM

## 2021-04-22 NOTE — Progress Notes (Signed)
Received call from microbiology lab stating that PCR sample was invalid and needs to be recollected.  Will need to recollect PCR DOS and administer Profend.

## 2021-04-28 ENCOUNTER — Other Ambulatory Visit (HOSPITAL_COMMUNITY)
Admission: RE | Admit: 2021-04-28 | Discharge: 2021-04-28 | Disposition: A | Discharge status: Home / Self Care | Payer: 59 | Source: Ambulatory Visit | Attending: Neurosurgery | Admitting: Neurosurgery

## 2021-04-28 ENCOUNTER — Other Ambulatory Visit (HOSPITAL_BASED_OUTPATIENT_CLINIC_OR_DEPARTMENT_OTHER): Payer: Self-pay

## 2021-04-28 ENCOUNTER — Encounter: Payer: Self-pay | Admitting: Hematology & Oncology

## 2021-04-28 DIAGNOSIS — Z20822 Contact with and (suspected) exposure to covid-19: Secondary | ICD-10-CM | POA: Insufficient documentation

## 2021-04-28 DIAGNOSIS — Z01812 Encounter for preprocedural laboratory examination: Secondary | ICD-10-CM | POA: Insufficient documentation

## 2021-04-28 LAB — SARS CORONAVIRUS 2 (TAT 6-24 HRS): SARS Coronavirus 2: NEGATIVE

## 2021-04-28 MED FILL — Sildenafil Citrate Tab 100 MG: ORAL | 4 days supply | Qty: 4 | Fill #2 | Status: AC

## 2021-04-30 ENCOUNTER — Inpatient Hospital Stay (HOSPITAL_COMMUNITY): Payer: 59

## 2021-04-30 ENCOUNTER — Other Ambulatory Visit: Payer: Self-pay

## 2021-04-30 ENCOUNTER — Inpatient Hospital Stay (HOSPITAL_COMMUNITY): Payer: 59 | Admitting: Certified Registered Nurse Anesthetist

## 2021-04-30 ENCOUNTER — Inpatient Hospital Stay (HOSPITAL_COMMUNITY): Payer: 59 | Admitting: Emergency Medicine

## 2021-04-30 ENCOUNTER — Encounter (HOSPITAL_COMMUNITY): Payer: Self-pay

## 2021-04-30 ENCOUNTER — Inpatient Hospital Stay (HOSPITAL_COMMUNITY)
Admission: RE | Admit: 2021-04-30 | Discharge: 2021-05-02 | DRG: 472 | Disposition: A | Discharge status: Home / Self Care | Payer: 59 | Attending: Neurosurgery | Admitting: Neurosurgery

## 2021-04-30 ENCOUNTER — Encounter (HOSPITAL_COMMUNITY)
Admission: RE | Disposition: A | Discharge status: Home / Self Care | Payer: Self-pay | Source: Home / Self Care | Attending: Neurosurgery

## 2021-04-30 DIAGNOSIS — Z7982 Long term (current) use of aspirin: Secondary | ICD-10-CM

## 2021-04-30 DIAGNOSIS — C9001 Multiple myeloma in remission: Secondary | ICD-10-CM | POA: Diagnosis present

## 2021-04-30 DIAGNOSIS — Z8616 Personal history of COVID-19: Secondary | ICD-10-CM | POA: Diagnosis not present

## 2021-04-30 DIAGNOSIS — Z419 Encounter for procedure for purposes other than remedying health state, unspecified: Secondary | ICD-10-CM

## 2021-04-30 DIAGNOSIS — M4802 Spinal stenosis, cervical region: Principal | ICD-10-CM | POA: Diagnosis present

## 2021-04-30 DIAGNOSIS — R1310 Dysphagia, unspecified: Secondary | ICD-10-CM | POA: Diagnosis not present

## 2021-04-30 DIAGNOSIS — I10 Essential (primary) hypertension: Secondary | ICD-10-CM | POA: Diagnosis present

## 2021-04-30 DIAGNOSIS — Z8249 Family history of ischemic heart disease and other diseases of the circulatory system: Secondary | ICD-10-CM | POA: Diagnosis not present

## 2021-04-30 DIAGNOSIS — Z9481 Bone marrow transplant status: Secondary | ICD-10-CM | POA: Diagnosis not present

## 2021-04-30 DIAGNOSIS — Z79899 Other long term (current) drug therapy: Secondary | ICD-10-CM | POA: Diagnosis not present

## 2021-04-30 DIAGNOSIS — M5001 Cervical disc disorder with myelopathy,  high cervical region: Secondary | ICD-10-CM | POA: Diagnosis present

## 2021-04-30 DIAGNOSIS — Z20822 Contact with and (suspected) exposure to covid-19: Secondary | ICD-10-CM | POA: Diagnosis present

## 2021-04-30 DIAGNOSIS — E785 Hyperlipidemia, unspecified: Secondary | ICD-10-CM | POA: Diagnosis present

## 2021-04-30 DIAGNOSIS — G959 Disease of spinal cord, unspecified: Secondary | ICD-10-CM | POA: Diagnosis present

## 2021-04-30 HISTORY — PX: ANTERIOR CERVICAL DECOMP/DISCECTOMY FUSION: SHX1161

## 2021-04-30 LAB — SURGICAL PCR SCREEN
MRSA, PCR: NEGATIVE
Staphylococcus aureus: NEGATIVE

## 2021-04-30 SURGERY — ANTERIOR CERVICAL DECOMPRESSION/DISCECTOMY FUSION 3 LEVELS
Anesthesia: General | Site: Neck

## 2021-04-30 MED ORDER — CHLORHEXIDINE GLUCONATE 0.12 % MT SOLN
OROMUCOSAL | Status: AC
  Administered 2021-04-30: 15 mL via OROMUCOSAL
  Filled 2021-04-30: qty 15

## 2021-04-30 MED ORDER — DEXAMETHASONE SODIUM PHOSPHATE 10 MG/ML IJ SOLN
INTRAMUSCULAR | Status: AC
  Filled 2021-04-30: qty 1

## 2021-04-30 MED ORDER — PROPOFOL 10 MG/ML IV BOLUS
INTRAVENOUS | Status: DC | PRN
  Administered 2021-04-30: 150 mg via INTRAVENOUS

## 2021-04-30 MED ORDER — OXYCODONE HCL 5 MG PO TABS
ORAL_TABLET | ORAL | Status: AC
  Administered 2021-04-30: 5 mg via ORAL
  Filled 2021-04-30: qty 1

## 2021-04-30 MED ORDER — 0.9 % SODIUM CHLORIDE (POUR BTL) OPTIME
TOPICAL | Status: DC | PRN
  Administered 2021-04-30: 1000 mL

## 2021-04-30 MED ORDER — LIDOCAINE 2% (20 MG/ML) 5 ML SYRINGE
INTRAMUSCULAR | Status: AC
  Filled 2021-04-30: qty 10

## 2021-04-30 MED ORDER — OMEGA-3-ACID ETHYL ESTERS 1 G PO CAPS
1.0000 g | ORAL_CAPSULE | Freq: Two times a day (BID) | ORAL | Status: DC
  Administered 2021-05-01 – 2021-05-02 (×3): 1 g via ORAL
  Filled 2021-04-30 (×4): qty 1

## 2021-04-30 MED ORDER — SODIUM CHLORIDE 0.9% FLUSH: 3.0000 mL | INTRAVENOUS | Status: DC | PRN

## 2021-04-30 MED ORDER — CYCLOBENZAPRINE HCL 10 MG PO TABS: 10.0000 mg | ORAL_TABLET | Freq: Three times a day (TID) | ORAL | Status: DC | PRN

## 2021-04-30 MED ORDER — ORAL CARE MOUTH RINSE: 15.0000 mL | Freq: Once | OROMUCOSAL | Status: AC

## 2021-04-30 MED ORDER — DEXAMETHASONE SODIUM PHOSPHATE 10 MG/ML IJ SOLN
INTRAMUSCULAR | Status: DC | PRN
  Administered 2021-04-30: 10 mg via INTRAVENOUS

## 2021-04-30 MED ORDER — GABAPENTIN 100 MG PO CAPS
100.0000 mg | ORAL_CAPSULE | Freq: Three times a day (TID) | ORAL | Status: DC
  Administered 2021-04-30 – 2021-05-02 (×5): 100 mg via ORAL
  Filled 2021-04-30 (×5): qty 1

## 2021-04-30 MED ORDER — FENTANYL CITRATE (PF) 250 MCG/5ML IJ SOLN
INTRAMUSCULAR | Status: DC | PRN
  Administered 2021-04-30: 50 ug via INTRAVENOUS
  Administered 2021-04-30: 25 ug via INTRAVENOUS
  Administered 2021-04-30: 50 ug via INTRAVENOUS
  Administered 2021-04-30 (×3): 25 ug via INTRAVENOUS

## 2021-04-30 MED ORDER — SODIUM CHLORIDE 0.9% FLUSH
3.0000 mL | Freq: Two times a day (BID) | INTRAVENOUS | Status: DC
  Administered 2021-05-01 (×2): 3 mL via INTRAVENOUS

## 2021-04-30 MED ORDER — OXYCODONE HCL 5 MG/5ML PO SOLN: 5.0000 mg | Freq: Once | ORAL | Status: AC | PRN

## 2021-04-30 MED ORDER — ACETAMINOPHEN 650 MG RE SUPP: 650.0000 mg | RECTAL | Status: DC | PRN

## 2021-04-30 MED ORDER — LIDOCAINE-EPINEPHRINE 1 %-1:100000 IJ SOLN
INTRAMUSCULAR | Status: AC
  Filled 2021-04-30: qty 1

## 2021-04-30 MED ORDER — MIDAZOLAM HCL 2 MG/2ML IJ SOLN
INTRAMUSCULAR | Status: DC | PRN
  Administered 2021-04-30: 2 mg via INTRAVENOUS

## 2021-04-30 MED ORDER — MORPHINE SULFATE (PF) 2 MG/ML IV SOLN
2.0000 mg | INTRAVENOUS | Status: DC | PRN
  Administered 2021-05-01 (×3): 2 mg via INTRAVENOUS
  Filled 2021-04-30 (×3): qty 1

## 2021-04-30 MED ORDER — ROCURONIUM BROMIDE 10 MG/ML (PF) SYRINGE
PREFILLED_SYRINGE | INTRAVENOUS | Status: AC
  Filled 2021-04-30: qty 10

## 2021-04-30 MED ORDER — ONDANSETRON HCL 4 MG/2ML IJ SOLN
INTRAMUSCULAR | Status: AC
  Filled 2021-04-30: qty 2

## 2021-04-30 MED ORDER — FLEET ENEMA 7-19 GM/118ML RE ENEM: 1.0000 | ENEMA | Freq: Once | RECTAL | Status: DC | PRN

## 2021-04-30 MED ORDER — LOSARTAN POTASSIUM 50 MG PO TABS
50.0000 mg | ORAL_TABLET | Freq: Every day | ORAL | Status: DC
  Administered 2021-05-01 – 2021-05-02 (×2): 50 mg via ORAL
  Filled 2021-04-30 (×2): qty 1

## 2021-04-30 MED ORDER — ACETAMINOPHEN 325 MG PO TABS: 650.0000 mg | ORAL_TABLET | ORAL | Status: DC | PRN

## 2021-04-30 MED ORDER — CHLORHEXIDINE GLUCONATE CLOTH 2 % EX PADS: 6.0000 | MEDICATED_PAD | Freq: Once | CUTANEOUS | Status: DC

## 2021-04-30 MED ORDER — OXYCODONE-ACETAMINOPHEN 5-325 MG PO TABS
1.0000 | ORAL_TABLET | Freq: Four times a day (QID) | ORAL | Status: DC | PRN
  Administered 2021-04-30 – 2021-05-01 (×2): 2 via ORAL
  Filled 2021-04-30 (×2): qty 2

## 2021-04-30 MED ORDER — MIDAZOLAM HCL 2 MG/2ML IJ SOLN
INTRAMUSCULAR | Status: AC
  Filled 2021-04-30: qty 2

## 2021-04-30 MED ORDER — LIDOCAINE-EPINEPHRINE 1 %-1:100000 IJ SOLN
INTRAMUSCULAR | Status: DC | PRN
  Administered 2021-04-30: 4 mL via INTRADERMAL
  Administered 2021-04-30: 2 mL via INTRADERMAL

## 2021-04-30 MED ORDER — CYCLOBENZAPRINE HCL 10 MG PO TABS
10.0000 mg | ORAL_TABLET | Freq: Three times a day (TID) | ORAL | Status: DC | PRN
  Administered 2021-05-01 – 2021-05-02 (×3): 10 mg via ORAL
  Filled 2021-04-30 (×3): qty 1

## 2021-04-30 MED ORDER — PHENYLEPHRINE 40 MCG/ML (10ML) SYRINGE FOR IV PUSH (FOR BLOOD PRESSURE SUPPORT)
PREFILLED_SYRINGE | INTRAVENOUS | Status: AC
  Filled 2021-04-30: qty 10

## 2021-04-30 MED ORDER — PROPOFOL 10 MG/ML IV BOLUS
INTRAVENOUS | Status: AC
  Filled 2021-04-30: qty 20

## 2021-04-30 MED ORDER — EPHEDRINE 5 MG/ML INJ
INTRAVENOUS | Status: AC
  Filled 2021-04-30: qty 10

## 2021-04-30 MED ORDER — TAMSULOSIN HCL 0.4 MG PO CAPS
0.4000 mg | ORAL_CAPSULE | Freq: Every day | ORAL | Status: DC
  Administered 2021-05-01 – 2021-05-02 (×2): 0.4 mg via ORAL
  Filled 2021-04-30 (×2): qty 1

## 2021-04-30 MED ORDER — ROCURONIUM BROMIDE 10 MG/ML (PF) SYRINGE
PREFILLED_SYRINGE | INTRAVENOUS | Status: DC | PRN
  Administered 2021-04-30: 50 mg via INTRAVENOUS
  Administered 2021-04-30: 80 mg via INTRAVENOUS
  Administered 2021-04-30: 50 mg via INTRAVENOUS
  Administered 2021-04-30: 20 mg via INTRAVENOUS

## 2021-04-30 MED ORDER — ROCURONIUM BROMIDE 10 MG/ML (PF) SYRINGE
PREFILLED_SYRINGE | INTRAVENOUS | Status: AC
  Filled 2021-04-30: qty 40

## 2021-04-30 MED ORDER — AMLODIPINE BESYLATE 5 MG PO TABS: 10.0000 mg | ORAL_TABLET | Freq: Every day | ORAL | Status: DC

## 2021-04-30 MED ORDER — THROMBIN 5000 UNITS EX SOLR: OROMUCOSAL | Status: DC | PRN

## 2021-04-30 MED ORDER — LACTATED RINGERS IV SOLN: INTRAVENOUS | Status: DC

## 2021-04-30 MED ORDER — BRIMONIDINE TARTRATE 0.025 % OP SOLN: 1.0000 [drp] | Freq: Every day | OPHTHALMIC | Status: DC

## 2021-04-30 MED ORDER — OXYCODONE HCL 5 MG PO TABS: 5.0000 mg | ORAL_TABLET | Freq: Once | ORAL | Status: AC | PRN

## 2021-04-30 MED ORDER — ONDANSETRON HCL 4 MG/2ML IJ SOLN: 4.0000 mg | Freq: Four times a day (QID) | INTRAMUSCULAR | Status: DC | PRN

## 2021-04-30 MED ORDER — HYDROMORPHONE HCL 1 MG/ML IJ SOLN
0.2500 mg | INTRAMUSCULAR | Status: DC | PRN
  Administered 2021-04-30: 0.5 mg via INTRAVENOUS

## 2021-04-30 MED ORDER — SODIUM CHLORIDE 0.9 % IV SOLN: 250.0000 mL | INTRAVENOUS | Status: DC

## 2021-04-30 MED ORDER — ONDANSETRON HCL 4 MG/2ML IJ SOLN: 4.0000 mg | Freq: Once | INTRAMUSCULAR | Status: DC | PRN

## 2021-04-30 MED ORDER — BUPIVACAINE HCL 0.5 % IJ SOLN
INTRAMUSCULAR | Status: DC | PRN
  Administered 2021-04-30: 4 mL

## 2021-04-30 MED ORDER — THROMBIN 5000 UNITS EX SOLR
CUTANEOUS | Status: AC
  Filled 2021-04-30: qty 5000

## 2021-04-30 MED ORDER — ONDANSETRON HCL 4 MG PO TABS: 4.0000 mg | ORAL_TABLET | Freq: Four times a day (QID) | ORAL | Status: DC | PRN

## 2021-04-30 MED ORDER — DOCUSATE SODIUM 100 MG PO CAPS
100.0000 mg | ORAL_CAPSULE | Freq: Two times a day (BID) | ORAL | Status: DC
  Administered 2021-04-30 – 2021-05-02 (×4): 100 mg via ORAL
  Filled 2021-04-30 (×4): qty 1

## 2021-04-30 MED ORDER — ONDANSETRON HCL 4 MG/2ML IJ SOLN
INTRAMUSCULAR | Status: DC | PRN
  Administered 2021-04-30: 4 mg via INTRAVENOUS

## 2021-04-30 MED ORDER — POTASSIUM CHLORIDE IN NACL 20-0.9 MEQ/L-% IV SOLN: INTRAVENOUS | Status: DC

## 2021-04-30 MED ORDER — MENTHOL 3 MG MT LOZG
1.0000 | LOZENGE | OROMUCOSAL | Status: DC | PRN
  Filled 2021-04-30: qty 9

## 2021-04-30 MED ORDER — BUPIVACAINE HCL (PF) 0.5 % IJ SOLN
INTRAMUSCULAR | Status: AC
  Filled 2021-04-30: qty 30

## 2021-04-30 MED ORDER — CEFAZOLIN SODIUM-DEXTROSE 2-4 GM/100ML-% IV SOLN
INTRAVENOUS | Status: AC
  Filled 2021-04-30: qty 100

## 2021-04-30 MED ORDER — VITAMIN D 25 MCG (1000 UNIT) PO TABS: 1000.0000 [IU] | ORAL_TABLET | Freq: Every day | ORAL | Status: DC

## 2021-04-30 MED ORDER — CHLORHEXIDINE GLUCONATE 0.12 % MT SOLN: 15.0000 mL | Freq: Once | OROMUCOSAL | Status: AC

## 2021-04-30 MED ORDER — LIDOCAINE 2% (20 MG/ML) 5 ML SYRINGE
INTRAMUSCULAR | Status: DC | PRN
  Administered 2021-04-30: 100 mg via INTRAVENOUS

## 2021-04-30 MED ORDER — POLYETHYLENE GLYCOL 3350 17 G PO PACK: 17.0000 g | PACK | Freq: Every day | ORAL | Status: DC | PRN

## 2021-04-30 MED ORDER — FENTANYL CITRATE (PF) 250 MCG/5ML IJ SOLN
INTRAMUSCULAR | Status: AC
  Filled 2021-04-30: qty 5

## 2021-04-30 MED ORDER — CYCLOBENZAPRINE HCL 10 MG PO TABS
ORAL_TABLET | ORAL | Status: AC
  Administered 2021-04-30: 10 mg via ORAL
  Filled 2021-04-30: qty 1

## 2021-04-30 MED ORDER — SUGAMMADEX SODIUM 200 MG/2ML IV SOLN
INTRAVENOUS | Status: DC | PRN
  Administered 2021-04-30: 200 mg via INTRAVENOUS

## 2021-04-30 MED ORDER — HYDROMORPHONE HCL 1 MG/ML IJ SOLN
INTRAMUSCULAR | Status: AC
  Administered 2021-04-30: 0.5 mg via INTRAVENOUS
  Filled 2021-04-30: qty 1

## 2021-04-30 MED ORDER — LIDOCAINE 2% (20 MG/ML) 5 ML SYRINGE
INTRAMUSCULAR | Status: AC
  Filled 2021-04-30: qty 5

## 2021-04-30 MED ORDER — PHENYLEPHRINE HCL-NACL 10-0.9 MG/250ML-% IV SOLN
INTRAVENOUS | Status: DC | PRN
  Administered 2021-04-30: 15 ug/min via INTRAVENOUS

## 2021-04-30 MED ORDER — GLYCOPYRROLATE PF 0.2 MG/ML IJ SOSY
PREFILLED_SYRINGE | INTRAMUSCULAR | Status: AC
  Filled 2021-04-30: qty 3

## 2021-04-30 MED ORDER — CEFAZOLIN SODIUM-DEXTROSE 2-4 GM/100ML-% IV SOLN: 2.0000 g | INTRAVENOUS | Status: DC

## 2021-04-30 MED ORDER — CEFAZOLIN SODIUM-DEXTROSE 1-4 GM/50ML-% IV SOLN
1.0000 g | Freq: Three times a day (TID) | INTRAVENOUS | Status: AC
  Administered 2021-04-30 – 2021-05-01 (×3): 1 g via INTRAVENOUS
  Filled 2021-04-30 (×3): qty 50

## 2021-04-30 MED ORDER — PHENOL 1.4 % MT LIQD: 1.0000 | OROMUCOSAL | Status: DC | PRN

## 2021-04-30 SURGICAL SUPPLY — 69 items
APL SKNCLS STERI-STRIP NONHPOA (GAUZE/BANDAGES/DRESSINGS) ×1
BAND INSRT 18 STRL LF DISP RB (MISCELLANEOUS) ×2
BAND RUBBER #18 3X1/16 STRL (MISCELLANEOUS) ×6 IMPLANT
BASKET BONE COLLECTION (BASKET) IMPLANT
BENZOIN TINCTURE PRP APPL 2/3 (GAUZE/BANDAGES/DRESSINGS) ×3 IMPLANT
BIT DRILL NEURO 2X3.1 SFT TUCH (MISCELLANEOUS) ×1 IMPLANT
BIT DRILL OZARK SU 2.3X16 (DRILL) IMPLANT
BLADE CLIPPER SURG (BLADE) IMPLANT
BLADE SURG 15 STRL LF DISP TIS (BLADE) IMPLANT
BLADE SURG 15 STRL SS (BLADE)
BLADE ULTRA TIP 2M (BLADE) IMPLANT
BUR MATCHSTICK NEURO 3.0 LAGG (BURR) ×3 IMPLANT
CANISTER SUCT 3000ML PPV (MISCELLANEOUS) ×3 IMPLANT
CLOSURE WOUND 1/2 X4 (GAUZE/BANDAGES/DRESSINGS) ×1
COVER WAND RF STERILE (DRAPES) ×1 IMPLANT
DECANTER SPIKE VIAL GLASS SM (MISCELLANEOUS) ×3 IMPLANT
DRAPE C-ARM 42X72 X-RAY (DRAPES) ×6 IMPLANT
DRAPE HALF SHEET 40X57 (DRAPES) ×3 IMPLANT
DRAPE LAPAROTOMY 100X72 PEDS (DRAPES) ×3 IMPLANT
DRAPE MICROSCOPE LEICA (MISCELLANEOUS) ×3 IMPLANT
DRILL NEURO 2X3.1 SOFT TOUCH (MISCELLANEOUS) ×3
DRILL OZARK SU 2.3X16 (DRILL) ×3
DRSG MEPILEX BORDER 4X4 (GAUZE/BANDAGES/DRESSINGS) ×1 IMPLANT
DRSG OPSITE 4X5.5 SM (GAUZE/BANDAGES/DRESSINGS) ×2 IMPLANT
DRSG OPSITE POSTOP 4X6 (GAUZE/BANDAGES/DRESSINGS) ×2 IMPLANT
DURAPREP 26ML APPLICATOR (WOUND CARE) ×3 IMPLANT
ELECT COATED BLADE 2.86 ST (ELECTRODE) ×3 IMPLANT
ELECT REM PT RETURN 9FT ADLT (ELECTROSURGICAL) ×3
ELECTRODE REM PT RTRN 9FT ADLT (ELECTROSURGICAL) ×1 IMPLANT
GAUZE 4X4 16PLY RFD (DISPOSABLE) IMPLANT
GAUZE SPONGE 4X4 12PLY STRL (GAUZE/BANDAGES/DRESSINGS) ×2 IMPLANT
GLOVE BIOGEL PI IND STRL 7.5 (GLOVE) ×1 IMPLANT
GLOVE BIOGEL PI INDICATOR 7.5 (GLOVE) ×4
GLOVE ECLIPSE 7.5 STRL STRAW (GLOVE) ×6 IMPLANT
GOWN STRL REUS W/ TWL LRG LVL3 (GOWN DISPOSABLE) ×3 IMPLANT
GOWN STRL REUS W/ TWL XL LVL3 (GOWN DISPOSABLE) ×1 IMPLANT
GOWN STRL REUS W/TWL 2XL LVL3 (GOWN DISPOSABLE) IMPLANT
GOWN STRL REUS W/TWL LRG LVL3 (GOWN DISPOSABLE) ×9
GOWN STRL REUS W/TWL XL LVL3 (GOWN DISPOSABLE) ×3
HEMOSTAT POWDER KIT SURGIFOAM (HEMOSTASIS) ×3 IMPLANT
KIT BASIN OR (CUSTOM PROCEDURE TRAY) ×3 IMPLANT
KIT TURNOVER KIT B (KITS) ×3 IMPLANT
NDL SPNL 22GX3.5 QUINCKE BK (NEEDLE) ×1 IMPLANT
NEEDLE HYPO 22GX1.5 SAFETY (NEEDLE) ×3 IMPLANT
NEEDLE SPNL 22GX3.5 QUINCKE BK (NEEDLE) ×3 IMPLANT
NS IRRIG 1000ML POUR BTL (IV SOLUTION) ×3 IMPLANT
PACK LAMINECTOMY NEURO (CUSTOM PROCEDURE TRAY) ×3 IMPLANT
PAD ARMBOARD 7.5X6 YLW CONV (MISCELLANEOUS) ×9 IMPLANT
PIN DISTRACTION 14MM (PIN) ×6 IMPLANT
PLATE CERV CONS OZARK 1X20 (Plate) ×2 IMPLANT
PLATE CERV CONS OZARK 2X46 (Plate) ×2 IMPLANT
PUTTY BONE 100 VESUVIUS 2.5CC (Putty) ×2 IMPLANT
SCREW VA ST OZARK 4X16 (Plate) ×30 IMPLANT
SPACER ANGLD CASCAD 16X13X8 7D (Spacer) ×6 IMPLANT
SPONGE INTESTINAL PEANUT (DISPOSABLE) ×3 IMPLANT
SPONGE SURGIFOAM ABS GEL SZ50 (HEMOSTASIS) ×1 IMPLANT
STAPLER VISISTAT 35W (STAPLE) IMPLANT
STRIP CLOSURE SKIN 1/2X4 (GAUZE/BANDAGES/DRESSINGS) ×2 IMPLANT
SUT MNCRL AB 4-0 PS2 18 (SUTURE) ×3 IMPLANT
SUT SILK 2 0 TIES 10X30 (SUTURE) IMPLANT
SUT VIC AB 0 CT1 27 (SUTURE)
SUT VIC AB 0 CT1 27XBRD ANTBC (SUTURE) IMPLANT
SUT VIC AB 2-0 CP2 18 (SUTURE) ×1 IMPLANT
SUT VIC AB 3-0 SH 8-18 (SUTURE) ×4 IMPLANT
TAPE CLOTH 3X10 TAN LF (GAUZE/BANDAGES/DRESSINGS) ×3 IMPLANT
TIP KERRISON THIN FOOTPLATE 2M (MISCELLANEOUS) ×3 IMPLANT
TOWEL GREEN STERILE (TOWEL DISPOSABLE) ×3 IMPLANT
TOWEL GREEN STERILE FF (TOWEL DISPOSABLE) ×3 IMPLANT
WATER STERILE IRR 1000ML POUR (IV SOLUTION) ×3 IMPLANT

## 2021-04-30 NOTE — Progress Notes (Signed)
Orthopedic Tech Progress Note Patient Details:  [facility] [birth year] [id number] Spoke with RN and RN said that MD wants patient to stay in [facility] Patient ID: Jimmy Day, male   DOB: 1961-11-08, 60 y.o.   MRN: [id number]   Jimmy Day 04/30/2021, 9:37 PM
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
## 2021-04-30 NOTE — Transfer of Care (Signed)
Immediate Anesthesia Transfer of Care Note  Patient: Anav Lammert [facility]  Procedure(s) Performed: Anterior Cervical Decompression Fusion Cervical three-four, Cervical four-five, Cervical six-seven (N/A Neck)  Patient Location: PACU  Anesthesia Type:General  Level of Consciousness: drowsy  Airway & Oxygen Therapy: Patient Spontanous Breathing and Patient connected to face mask oxygen  Post-op Assessment: Report given to RN and Post -op Vital signs reviewed and stable  Post vital signs: Reviewed and stable  Last Vitals:  Vitals Value Taken Time  BP 149/87 04/30/21 1806  Temp    Pulse 80 04/30/21 1810  Resp 19 04/30/21 1810  SpO2 99 % 04/30/21 1810  Vitals shown include unvalidated device data.  Last Pain:  Vitals:   04/30/21 1118  TempSrc:   PainSc: 4       Patients Stated Pain Goal: 2 (56/38/93 7342)  Complications: No complications documented.
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
## 2021-04-30 NOTE — Anesthesia Procedure Notes (Signed)
Procedure Name: Intubation Date/Time: 04/30/2021 12:53 PM Performed by: Valda Favia, CRNA Pre-anesthesia Checklist: Patient identified, Emergency Drugs available, Suction available and Patient being monitored Patient Re-evaluated:Patient Re-evaluated prior to induction Oxygen Delivery Method: Circle System Utilized Preoxygenation: Pre-oxygenation with 100% oxygen Induction Type: IV induction Ventilation: Mask ventilation without difficulty Laryngoscope Size: Glidescope and 4 Grade View: Grade I Tube type: Oral Tube size: 7.5 mm Number of attempts: 1 Airway Equipment and Method: Stylet,  Oral airway and Video-laryngoscopy Placement Confirmation: ETT inserted through vocal cords under direct vision,  positive ETCO2 and breath sounds checked- equal and bilateral Secured at: 23 cm Tube secured with: Tape Dental Injury: Teeth and Oropharynx as per pre-operative assessment

## 2021-05-01 ENCOUNTER — Other Ambulatory Visit (HOSPITAL_BASED_OUTPATIENT_CLINIC_OR_DEPARTMENT_OTHER): Payer: Self-pay

## 2021-05-01 ENCOUNTER — Encounter: Payer: Self-pay | Admitting: Hematology & Oncology

## 2021-05-01 MED ORDER — OXYCODONE-ACETAMINOPHEN 5-325 MG PO TABS
1.0000 | ORAL_TABLET | ORAL | Status: DC | PRN
  Administered 2021-05-01 (×3): 2 via ORAL
  Administered 2021-05-02: 1 via ORAL
  Administered 2021-05-02: 2 via ORAL
  Filled 2021-05-01 (×5): qty 2

## 2021-05-01 MED ORDER — OXYCODONE-ACETAMINOPHEN 5-325 MG PO TABS
ORAL_TABLET | ORAL | 0 refills | Status: DC
  Filled 2021-05-01: qty 45, 6d supply, fill #0

## 2021-05-01 NOTE — Progress Notes (Signed)
Subjective: Patient reports arm pain and weakness is improved, but complains of neck pain.  Objective: Vital signs in last 24 hours: Temp:  [97.8 F (36.6 C)-99.3 F (37.4 C)] 97.9 F (36.6 C) (06/09 0742) Pulse Rate:  [63-84] 63 (06/09 0742) Resp:  [10-20] 18 (06/09 0742) BP: (131-164)/(81-97) 154/92 (06/09 0742) SpO2:  [95 %-100 %] 100 % (06/09 0742) Weight:  [93 kg] 93 kg (06/08 1100)  Intake/Output from previous day: 06/08 0701 - 06/09 0700 In: 2500 [I.V.:2500] Out: 575 [Urine:425; Blood:150] Intake/Output this shift: No intake/output data recorded.  NAD Voice mildly hoarse but phonating well Wound c/d 4+/5 Wext and B on left, improved from preop. Otw 5/5 strength  Lab Results: No results for input(s): WBC, HGB, HCT, PLT in the last 72 hours. BMET No results for input(s): NA, K, CL, CO2, GLUCOSE, BUN, CREATININE, CALCIUM in the last 72 hours.  Studies/Results: DG Cervical Spine 2 or 3 views  Result Date: 04/30/2021 CLINICAL DATA:  Anterior cervical decompression and fusion C3-C4, C4-C5, C6-C7. EXAM: CERVICAL SPINE - 2-3 VIEW; DG C-ARM 1-60 MIN COMPARISON:  Preoperative radiograph 03/14/2021 FINDINGS: Three fluoroscopic spot views of the cervical spine obtained in the operating room. There is new anterior fusion with interbody spacers C3-C4, C4-C5. New anterior fusion with interbody spacer at C6-C7. Prior fusion at C5-C6. fluoroscopy time 25 seconds. Dose 4.78 mGy. IMPRESSION: Intraoperative fluoroscopy during cervical spine fusion. Electronically Signed   By: Keith Rake M.D.   On: 04/30/2021 18:13   DG C-Arm 1-60 Min  Result Date: 04/30/2021 CLINICAL DATA:  Anterior cervical decompression and fusion C3-C4, C4-C5, C6-C7. EXAM: CERVICAL SPINE - 2-3 VIEW; DG C-ARM 1-60 MIN COMPARISON:  Preoperative radiograph 03/14/2021 FINDINGS: Three fluoroscopic spot views of the cervical spine obtained in the operating room. There is new anterior fusion with interbody spacers C3-C4,  C4-C5. New anterior fusion with interbody spacer at C6-C7. Prior fusion at C5-C6. fluoroscopy time 25 seconds. Dose 4.78 mGy. IMPRESSION: Intraoperative fluoroscopy during cervical spine fusion. Electronically Signed   By: Keith Rake M.D.   On: 04/30/2021 18:13    Assessment/Plan: S/p 3 lvl ACDF - will continue supportive care with likely d/c tomorrow morning  LOS: 1 day     Vallarie Mare 05/01/2021, 10:51 AM
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
## 2021-05-01 NOTE — Anesthesia Postprocedure Evaluation (Signed)
Anesthesia Post Note  Patient: Jimmy Day  Procedure(s) Performed: Anterior Cervical Decompression Fusion Cervical three-four, Cervical four-five, Cervical six-seven (Neck)     Patient location during evaluation: PACU Anesthesia Type: General Level of consciousness: awake and alert Pain management: pain level controlled Vital Signs Assessment: post-procedure vital signs reviewed and stable Respiratory status: spontaneous breathing, nonlabored ventilation, respiratory function stable and patient connected to nasal cannula oxygen Cardiovascular status: blood pressure returned to baseline and stable Postop Assessment: no apparent nausea or vomiting Anesthetic complications: no   No notable events documented.  Last Vitals:  Vitals:   05/01/21 0404 05/01/21 0742  BP: (!) 159/87 (!) 154/92  Pulse: 82 63  Resp: 20 18  Temp: 37.4 C 36.6 C  SpO2: 98% 100%    Last Pain:  Vitals:   05/01/21 0742  TempSrc: Oral  PainSc:                  [name] S

## 2021-05-02 ENCOUNTER — Encounter: Payer: Self-pay | Admitting: Hematology & Oncology

## 2021-05-02 ENCOUNTER — Other Ambulatory Visit (HOSPITAL_BASED_OUTPATIENT_CLINIC_OR_DEPARTMENT_OTHER): Payer: Self-pay

## 2021-05-02 ENCOUNTER — Other Ambulatory Visit (HOSPITAL_COMMUNITY): Payer: Self-pay

## 2021-05-02 ENCOUNTER — Encounter (HOSPITAL_COMMUNITY): Payer: Self-pay | Admitting: Neurosurgery

## 2021-05-02 MED ORDER — DOCUSATE SODIUM 100 MG PO CAPS
100.0000 mg | ORAL_CAPSULE | Freq: Two times a day (BID) | ORAL | 2 refills | Status: DC
  Filled 2021-05-02: qty 60, 30d supply, fill #0

## 2021-05-02 MED ORDER — OXYCODONE-ACETAMINOPHEN 5-325 MG PO TABS: 1.0000 | ORAL_TABLET | ORAL | 0 refills | Status: DC | PRN

## 2021-05-02 MED ORDER — CYCLOBENZAPRINE HCL 10 MG PO TABS
10.0000 mg | ORAL_TABLET | Freq: Three times a day (TID) | ORAL | 0 refills | Status: DC | PRN
  Filled 2021-05-02 (×2): qty 30, 10d supply, fill #0

## 2021-05-02 NOTE — Discharge Instructions (Signed)
Wound Care Leave incision open to air. You may shower. Do not scrub directly on incision.  Do not put any creams, lotions, or ointments on incision. Activity Walk each and every day, increasing distance each day. No lifting greater than 5 lbs.  Avoid excessive neck motion. No driving for 2 weeks; may ride as a passenger locally. Wear neck brace at all times except when showering.  IResume your normal diet.  Return to Work Will be discussed at you follow up appointment. Call Your Doctor If Any of These Occur Redness, drainage, or swelling at the wound.  Temperature greater than 101 degrees. Severe pain not relieved by pain medication. Increased difficulty swallowing. Incision starts to come apart. Follow Up Appt Call today for appointment in 2-4 weeks ([phone]) or for problems.  If you have any hardware placed in your spine, you will need an x-ray before your appointment.

## 2021-05-02 NOTE — Progress Notes (Signed)
Patient is discharged from room 3C11 at this time. Alert and in stable condition. IV site d/c'd and instructions read to patient and spouse with understanding verbalized and all questions answered. Left unit via wheelchair with all belongings at side.
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
## 2021-05-07 ENCOUNTER — Encounter: Payer: Self-pay | Admitting: Hematology & Oncology

## 2021-05-07 ENCOUNTER — Other Ambulatory Visit: Payer: Self-pay

## 2021-05-07 ENCOUNTER — Emergency Department (HOSPITAL_COMMUNITY)
Admission: EM | Admit: 2021-05-07 | Discharge: 2021-05-08 | Disposition: A | Discharge status: Home / Self Care | Payer: 59 | Attending: Emergency Medicine | Admitting: Emergency Medicine

## 2021-05-07 DIAGNOSIS — Z8616 Personal history of COVID-19: Secondary | ICD-10-CM | POA: Diagnosis not present

## 2021-05-07 DIAGNOSIS — Z79899 Other long term (current) drug therapy: Secondary | ICD-10-CM | POA: Insufficient documentation

## 2021-05-07 DIAGNOSIS — M542 Cervicalgia: Secondary | ICD-10-CM | POA: Diagnosis not present

## 2021-05-07 DIAGNOSIS — G8928 Other chronic postprocedural pain: Secondary | ICD-10-CM | POA: Insufficient documentation

## 2021-05-07 DIAGNOSIS — I10 Essential (primary) hypertension: Secondary | ICD-10-CM | POA: Diagnosis not present

## 2021-05-07 DIAGNOSIS — Z7982 Long term (current) use of aspirin: Secondary | ICD-10-CM | POA: Insufficient documentation

## 2021-05-07 DIAGNOSIS — G8918 Other acute postprocedural pain: Secondary | ICD-10-CM

## 2021-05-07 DIAGNOSIS — M25512 Pain in left shoulder: Secondary | ICD-10-CM | POA: Insufficient documentation

## 2021-05-07 DIAGNOSIS — M6281 Muscle weakness (generalized): Secondary | ICD-10-CM | POA: Insufficient documentation

## 2021-05-07 NOTE — ED Triage Notes (Signed)
Pt here with family, pt had neck surgery one week ago. C/o left shoulder pain and difficulty swallowing.

## 2021-05-07 NOTE — ED Provider Notes (Signed)
Emergency Medicine Provider Triage Evaluation Note  Jimmy Day , a 60 y.o. male  was evaluated in triage.  Pt complains of worsening neck pain and weakness.  He underwent cervical decompression for cervical stenosis with myelopathy with D Thomas on 04/30/21.  States since he has been having a lot of trouble swallowing, feels like it gets stuck and will not go down.  Wife states he has had about half bottle of water in total since his surgery.  States now he has worsening pain in the left arm and feels generally weak.  Review of Systems  Positive: Neck, left arm pain, trouble swallowing Negative: numbness  Physical Exam  BP (!) 144/90 (BP Location: Right Arm)   Pulse 100   Temp 98 F (36.7 C) (Oral)   Resp 18   SpO2 98%  Gen:   Awake, no distress, appears uncomfortable Resp:  Normal effort MSK:   Moving right arm fairly well, limited movement of LUE due to pain, seems slightly weaker in LUE compared with right (may be pain related; moving BLE equally on command Other-- has aspen collar in place, steri-strips present over anterior incision that overall appear clean  Medical Decision Making  Medically screening exam initiated at 11:22 PM.  Appropriate orders placed.  Jimmy Day was informed that the remainder of the evaluation will be completed by another provider, this initial triage assessment does not replace that evaluation, and the importance of remaining in the ED until their evaluation is complete.  Patient made an acuity 2, have asked charge nurse to expedite rooming.   Larene Pickett, PA-C 05/08/21 0043    Tegeler, Gwenyth Allegra, MD 05/08/21 [phone]

## 2021-05-08 ENCOUNTER — Emergency Department (HOSPITAL_COMMUNITY): Payer: 59

## 2021-05-08 LAB — BASIC METABOLIC PANEL
Anion gap: 10 (ref 5–15)
BUN: 21 mg/dL — ABNORMAL HIGH (ref 6–20)
CO2: 23 mmol/L (ref 22–32)
Calcium: 9 mg/dL (ref 8.9–10.3)
Chloride: 105 mmol/L (ref 98–111)
Creatinine, Ser: 1.19 mg/dL (ref 0.61–1.24)
GFR, Estimated: >60 mL/min (ref >60)
Glucose, Bld: 131 mg/dL — ABNORMAL HIGH (ref 70–99)
Potassium: 3.8 mmol/L (ref 3.5–5.1)
Sodium: 138 mmol/L (ref 135–145)

## 2021-05-08 LAB — CBC WITH DIFFERENTIAL/PLATELET
Abs Immature Granulocytes: 0.03 10*3/uL (ref 0.00–0.07)
Basophils Absolute: 0.1 10*3/uL (ref 0.0–0.1)
Basophils Relative: 1 %
Eosinophils Absolute: 0.1 10*3/uL (ref 0.0–0.5)
Eosinophils Relative: 1 %
HCT: 43.1 % (ref 39.0–52.0)
Hemoglobin: 14.5 g/dL (ref 13.0–17.0)
Immature Granulocytes: 0 %
Lymphocytes Relative: 16 %
Lymphs Abs: 1.1 10*3/uL (ref 0.7–4.0)
MCH: 31.8 pg (ref 26.0–34.0)
MCHC: 33.6 g/dL (ref 30.0–36.0)
MCV: 94.5 fL (ref 80.0–100.0)
Monocytes Absolute: 0.5 10*3/uL (ref 0.1–1.0)
Monocytes Relative: 7 %
Neutro Abs: 5.3 10*3/uL (ref 1.7–7.7)
Neutrophils Relative %: 75 %
Platelets: 257 10*3/uL (ref 150–400)
RBC: 4.56 MIL/uL (ref 4.22–5.81)
RDW: 13.4 % (ref 11.5–15.5)
WBC: 7 10*3/uL (ref 4.0–10.5)
nRBC: 0 % (ref 0.0–0.2)

## 2021-05-08 MED ORDER — OXYCODONE HCL 5 MG PO TABS: 5.0000 mg | ORAL_TABLET | ORAL | 0 refills | Status: DC | PRN

## 2021-05-08 MED ORDER — MORPHINE SULFATE (PF) 4 MG/ML IV SOLN
4.0000 mg | Freq: Once | INTRAVENOUS | Status: AC
  Administered 2021-05-08: 4 mg via INTRAVENOUS
  Filled 2021-05-08: qty 1

## 2021-05-08 MED ORDER — LIDOCAINE 5 % EX PTCH: 1.0000 | MEDICATED_PATCH | CUTANEOUS | 0 refills | Status: DC
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
## 2021-05-08 NOTE — Discharge Instructions (Addendum)
Your MRI and imaging today was reassuring and we did not see convincing evidence of acute infection.  I spoke with neurosurgery who looked at the images and feel this is likely just postoperative pain and some edema.  They feel you are safe for discharge home to follow-up with your neurosurgeon.  Please call to set up a close appointment.  If any symptoms change or worsen, please return to the nearest emergency department.  Please use the pain medicine to add to your home pain regimen and use the Lidoderm patches on the shoulder.

## 2021-05-09 ENCOUNTER — Inpatient Hospital Stay: Payer: 59

## 2021-05-09 ENCOUNTER — Inpatient Hospital Stay: Payer: 59 | Attending: Family

## 2021-05-09 ENCOUNTER — Inpatient Hospital Stay: Payer: 59 | Admitting: Hematology & Oncology

## 2021-05-24 ENCOUNTER — Other Ambulatory Visit: Payer: Self-pay | Admitting: Hematology & Oncology

## 2021-05-24 DIAGNOSIS — C9000 Multiple myeloma not having achieved remission: Secondary | ICD-10-CM

## 2021-06-02 ENCOUNTER — Other Ambulatory Visit (HOSPITAL_BASED_OUTPATIENT_CLINIC_OR_DEPARTMENT_OTHER): Payer: Self-pay

## 2021-06-02 ENCOUNTER — Encounter: Payer: Self-pay | Admitting: Hematology & Oncology

## 2021-06-02 ENCOUNTER — Other Ambulatory Visit: Payer: Self-pay | Admitting: *Deleted

## 2021-06-02 ENCOUNTER — Inpatient Hospital Stay: Payer: 59 | Attending: Family

## 2021-06-02 ENCOUNTER — Other Ambulatory Visit: Payer: Self-pay

## 2021-06-02 ENCOUNTER — Inpatient Hospital Stay: Payer: 59

## 2021-06-02 ENCOUNTER — Telehealth: Payer: Self-pay

## 2021-06-02 ENCOUNTER — Inpatient Hospital Stay (HOSPITAL_BASED_OUTPATIENT_CLINIC_OR_DEPARTMENT_OTHER): Payer: 59 | Admitting: Hematology & Oncology

## 2021-06-02 VITALS — BP 133/86 | HR 83 | Temp 98.9°F | Resp 20 | Wt 184.8 lb

## 2021-06-02 DIAGNOSIS — C9000 Multiple myeloma not having achieved remission: Secondary | ICD-10-CM

## 2021-06-02 LAB — CMP (CANCER CENTER ONLY)
ALT: 12 U/L (ref 0–44)
AST: 11 U/L — ABNORMAL LOW (ref 15–41)
Albumin: 4.2 g/dL (ref 3.5–5.0)
Alkaline Phosphatase: 51 U/L (ref 38–126)
Anion gap: 4 — ABNORMAL LOW (ref 5–15)
BUN: 16 mg/dL (ref 6–20)
CO2: 31 mmol/L (ref 22–32)
Calcium: 9.5 mg/dL (ref 8.9–10.3)
Chloride: 102 mmol/L (ref 98–111)
Creatinine: 1.27 mg/dL — ABNORMAL HIGH (ref 0.61–1.24)
GFR, Estimated: >60 mL/min (ref >60)
Glucose, Bld: 148 mg/dL — ABNORMAL HIGH (ref 70–99)
Potassium: 4 mmol/L (ref 3.5–5.1)
Sodium: 137 mmol/L (ref 135–145)
Total Bilirubin: 0.5 mg/dL (ref 0.3–1.2)
Total Protein: 6.9 g/dL (ref 6.5–8.1)

## 2021-06-02 LAB — CBC WITH DIFFERENTIAL (CANCER CENTER ONLY)
Abs Immature Granulocytes: 0.01 10*3/uL (ref 0.00–0.07)
Basophils Absolute: 0.1 10*3/uL (ref 0.0–0.1)
Basophils Relative: 2 %
Eosinophils Absolute: 0.1 10*3/uL (ref 0.0–0.5)
Eosinophils Relative: 2 %
HCT: 40.9 % (ref 39.0–52.0)
Hemoglobin: 13.9 g/dL (ref 13.0–17.0)
Immature Granulocytes: 0 %
Lymphocytes Relative: 38 %
Lymphs Abs: 1.6 10*3/uL (ref 0.7–4.0)
MCH: 31.3 pg (ref 26.0–34.0)
MCHC: 34 g/dL (ref 30.0–36.0)
MCV: 92.1 fL (ref 80.0–100.0)
Monocytes Absolute: 0.3 10*3/uL (ref 0.1–1.0)
Monocytes Relative: 7 %
Neutro Abs: 2.1 10*3/uL (ref 1.7–7.7)
Neutrophils Relative %: 51 %
Platelet Count: 279 10*3/uL (ref 150–400)
RBC: 4.44 MIL/uL (ref 4.22–5.81)
RDW: 12.8 % (ref 11.5–15.5)
WBC Count: 4.2 10*3/uL (ref 4.0–10.5)
nRBC: 0 % (ref 0.0–0.2)

## 2021-06-02 MED ORDER — DENOSUMAB 120 MG/1.7ML ~~LOC~~ SOLN
SUBCUTANEOUS | Status: AC
  Filled 2021-06-02: qty 1.7

## 2021-06-02 MED ORDER — LENALIDOMIDE 10 MG PO CAPS: ORAL_CAPSULE | ORAL | 0 refills | Status: DC

## 2021-06-02 MED ORDER — DENOSUMAB 120 MG/1.7ML ~~LOC~~ SOLN
120.0000 mg | Freq: Once | SUBCUTANEOUS | Status: AC
  Administered 2021-06-02: 120 mg via SUBCUTANEOUS

## 2021-06-02 MED FILL — Sildenafil Citrate Tab 100 MG: ORAL | 4 days supply | Qty: 4 | Fill #3 | Status: AC

## 2021-06-02 NOTE — Patient Instructions (Signed)
Denosumab injection What is this medication? DENOSUMAB (den oh sue mab) slows bone breakdown. Prolia is used to treat osteoporosis in women after menopause and in men, and in people who are taking corticosteroids for 6 months or more. Delton See is used to treat a high calcium level due to cancer and to prevent bone fractures and other bone problems caused by multiple myeloma or cancer bone metastases. Delton See is also used totreat giant cell tumor of the bone. This medicine may be used for other purposes; ask your health care provider orpharmacist if you have questions. COMMON BRAND NAME(S): Prolia, XGEVA What should I tell my care team before I take this medication? They need to know if you have any of these conditions: dental disease having surgery or tooth extraction infection kidney disease low levels of calcium or Vitamin D in the blood malnutrition on hemodialysis skin conditions or sensitivity thyroid or parathyroid disease an unusual reaction to denosumab, other medicines, foods, dyes, or preservatives pregnant or trying to get pregnant breast-feeding How should I use this medication? This medicine is for injection under the skin. It is given by a health careprofessional in a hospital or clinic setting. A special MedGuide will be given to you before each treatment. Be sure to readthis information carefully each time. For Prolia, talk to your pediatrician regarding the use of this medicine in children. Special care may be needed. For Delton See, talk to your pediatrician regarding the use of this medicine in children. While this drug may be prescribed for children as young as 13 years for selected conditions,precautions do apply. Overdosage: If you think you have taken too much of this medicine contact apoison control center or emergency room at once. NOTE: This medicine is only for you. Do not share this medicine with others. What if I miss a dose? It is important not to miss your dose. Call  your doctor or health careprofessional if you are unable to keep an appointment. What may interact with this medication? Do not take this medicine with any of the following medications: other medicines containing denosumab This medicine may also interact with the following medications: medicines that lower your chance of fighting infection steroid medicines like prednisone or cortisone This list may not describe all possible interactions. Give your health care provider a list of all the medicines, herbs, non-prescription drugs, or dietary supplements you use. Also tell them if you smoke, drink alcohol, or use illegaldrugs. Some items may interact with your medicine. What should I watch for while using this medication? Visit your doctor or health care professional for regular checks on your progress. Your doctor or health care professional may order blood tests andother tests to see how you are doing. Call your doctor or health care professional for advice if you get a fever, chills or sore throat, or other symptoms of a cold or flu. Do not treat yourself. This drug may decrease your body's ability to fight infection. Try toavoid being around people who are sick. You should make sure you get enough calcium and vitamin D while you are taking this medicine, unless your doctor tells you not to. Discuss the foods you eatand the vitamins you take with your health care professional. See your dentist regularly. Brush and floss your teeth as directed. Before youhave any dental work done, tell your dentist you are receiving this medicine. Do not become pregnant while taking this medicine or for 5 months after stopping it. Talk with your doctor or health care professional about your  birth control options while taking this medicine. Women should inform their doctor if they wish to become pregnant or think they might be pregnant. There is a potential for serious side effects to an unborn child. Talk to your health  careprofessional or pharmacist for more information. What side effects may I notice from receiving this medication? Side effects that you should report to your doctor or health care professionalas soon as possible: allergic reactions like skin rash, itching or hives, swelling of the face, lips, or tongue bone pain breathing problems dizziness jaw pain, especially after dental work redness, blistering, peeling of the skin signs and symptoms of infection like fever or chills; cough; sore throat; pain or trouble passing urine signs of low calcium like fast heartbeat, muscle cramps or muscle pain; pain, tingling, numbness in the hands or feet; seizures unusual bleeding or bruising unusually weak or tired Side effects that usually do not require medical attention (report to yourdoctor or health care professional if they continue or are bothersome): constipation diarrhea headache joint pain loss of appetite muscle pain runny nose tiredness upset stomach This list may not describe all possible side effects. Call your doctor for medical advice about side effects. You may report side effects to FDA at1-800-FDA-1088. Where should I keep my medication? This medicine is only given in a clinic, doctor's office, or other health caresetting and will not be stored at home. NOTE: This sheet is a summary. It may not cover all possible information. If you have questions about this medicine, talk to your doctor, pharmacist, orhealth care provider.  2022 Elsevier/Gold Standard (2018-03-18 16:10:44)

## 2021-06-02 NOTE — Telephone Encounter (Signed)
Appts made per 06/02/21 los, pt to gain sch at [employer], avs and in [employer]
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
## 2021-06-03 LAB — IGG, IGA, IGM
IgA: 25 mg/dL — ABNORMAL LOW (ref 90–386)
IgG (Immunoglobin G), Serum: 1772 mg/dL — ABNORMAL HIGH (ref 603–1613)
IgM (Immunoglobulin M), Srm: 20 mg/dL (ref 20–172)

## 2021-06-03 LAB — KAPPA/LAMBDA LIGHT CHAINS
Kappa free light chain: 24.1 mg/L — ABNORMAL HIGH (ref 3.3–19.4)
Kappa, lambda light chain ratio: 1.87 — ABNORMAL HIGH (ref 0.26–1.65)
Lambda free light chains: 12.9 mg/L (ref 5.7–26.3)

## 2021-06-04 ENCOUNTER — Other Ambulatory Visit: Payer: Self-pay | Admitting: *Deleted

## 2021-06-04 DIAGNOSIS — C9000 Multiple myeloma not having achieved remission: Secondary | ICD-10-CM

## 2021-06-04 LAB — PROTEIN ELECTROPHORESIS, SERUM
A/G Ratio: 1.2 (ref 0.7–1.7)
Albumin ELP: 3.7 g/dL (ref 2.9–4.4)
Alpha-1-Globulin: 0.2 g/dL (ref 0.0–0.4)
Alpha-2-Globulin: 0.6 g/dL (ref 0.4–1.0)
Beta Globulin: 0.9 g/dL (ref 0.7–1.3)
Gamma Globulin: 1.6 g/dL (ref 0.4–1.8)
Globulin, Total: 3.2 g/dL (ref 2.2–3.9)
Total Protein ELP: 6.9 g/dL (ref 6.0–8.5)

## 2021-06-04 MED ORDER — LENALIDOMIDE 10 MG PO CAPS: ORAL_CAPSULE | ORAL | 0 refills | Status: DC

## 2021-07-01 ENCOUNTER — Other Ambulatory Visit: Payer: Self-pay | Admitting: Hematology & Oncology

## 2021-07-01 DIAGNOSIS — C9000 Multiple myeloma not having achieved remission: Secondary | ICD-10-CM

## 2021-07-02 ENCOUNTER — Other Ambulatory Visit: Payer: Self-pay

## 2021-07-02 DIAGNOSIS — C9000 Multiple myeloma not having achieved remission: Secondary | ICD-10-CM

## 2021-07-02 MED ORDER — LENALIDOMIDE 10 MG PO CAPS: ORAL_CAPSULE | ORAL | 0 refills | Status: DC

## 2021-08-03 ENCOUNTER — Other Ambulatory Visit: Payer: Self-pay | Admitting: Hematology & Oncology

## 2021-08-03 DIAGNOSIS — C9000 Multiple myeloma not having achieved remission: Secondary | ICD-10-CM

## 2021-08-05 ENCOUNTER — Other Ambulatory Visit: Payer: Self-pay

## 2021-08-05 DIAGNOSIS — C9000 Multiple myeloma not having achieved remission: Secondary | ICD-10-CM

## 2021-08-05 MED ORDER — LENALIDOMIDE 10 MG PO CAPS: ORAL_CAPSULE | ORAL | 0 refills | Status: DC

## 2021-09-02 ENCOUNTER — Inpatient Hospital Stay: Payer: 59 | Admitting: Hematology & Oncology

## 2021-09-02 ENCOUNTER — Inpatient Hospital Stay: Payer: 59

## 2021-09-02 ENCOUNTER — Inpatient Hospital Stay: Payer: 59 | Attending: Family

## 2021-09-05 ENCOUNTER — Other Ambulatory Visit: Payer: Self-pay | Admitting: *Deleted

## 2021-09-05 DIAGNOSIS — C9000 Multiple myeloma not having achieved remission: Secondary | ICD-10-CM

## 2021-09-05 MED ORDER — LENALIDOMIDE 10 MG PO CAPS: ORAL_CAPSULE | ORAL | 0 refills | Status: DC

## 2021-09-12 ENCOUNTER — Other Ambulatory Visit (HOSPITAL_BASED_OUTPATIENT_CLINIC_OR_DEPARTMENT_OTHER): Payer: Self-pay

## 2021-09-12 ENCOUNTER — Encounter: Payer: Self-pay | Admitting: Hematology & Oncology

## 2021-09-12 MED FILL — Sildenafil Citrate Tab 100 MG: ORAL | 4 days supply | Qty: 4 | Fill #4 | Status: AC

## 2021-10-03 ENCOUNTER — Other Ambulatory Visit: Payer: Self-pay | Admitting: Hematology & Oncology

## 2021-10-03 DIAGNOSIS — C9000 Multiple myeloma not having achieved remission: Secondary | ICD-10-CM

## 2021-10-13 ENCOUNTER — Ambulatory Visit: Payer: 59 | Attending: Internal Medicine

## 2021-10-13 ENCOUNTER — Telehealth: Payer: Self-pay | Admitting: Hematology & Oncology

## 2021-10-13 ENCOUNTER — Inpatient Hospital Stay: Payer: 59

## 2021-10-13 ENCOUNTER — Other Ambulatory Visit (HOSPITAL_BASED_OUTPATIENT_CLINIC_OR_DEPARTMENT_OTHER): Payer: Self-pay

## 2021-10-13 ENCOUNTER — Inpatient Hospital Stay: Payer: 59 | Attending: Family

## 2021-10-13 ENCOUNTER — Encounter: Payer: Self-pay | Admitting: Hematology & Oncology

## 2021-10-13 ENCOUNTER — Other Ambulatory Visit: Payer: Self-pay

## 2021-10-13 ENCOUNTER — Inpatient Hospital Stay (HOSPITAL_BASED_OUTPATIENT_CLINIC_OR_DEPARTMENT_OTHER): Payer: 59 | Admitting: Hematology & Oncology

## 2021-10-13 VITALS — BP 119/68 | HR 70 | Temp 98.3°F | Resp 18 | Wt 197.0 lb

## 2021-10-13 DIAGNOSIS — C9000 Multiple myeloma not having achieved remission: Secondary | ICD-10-CM

## 2021-10-13 DIAGNOSIS — Z23 Encounter for immunization: Secondary | ICD-10-CM

## 2021-10-13 LAB — CBC WITH DIFFERENTIAL (CANCER CENTER ONLY)
Abs Immature Granulocytes: 0.01 10*3/uL (ref 0.00–0.07)
Basophils Absolute: 0.1 10*3/uL (ref 0.0–0.1)
Basophils Relative: 2 %
Eosinophils Absolute: 0.1 10*3/uL (ref 0.0–0.5)
Eosinophils Relative: 3 %
HCT: 38.9 % — ABNORMAL LOW (ref 39.0–52.0)
Hemoglobin: 13.6 g/dL (ref 13.0–17.0)
Immature Granulocytes: 0 %
Lymphocytes Relative: 36 %
Lymphs Abs: 1.5 10*3/uL (ref 0.7–4.0)
MCH: 32.2 pg (ref 26.0–34.0)
MCHC: 35 g/dL (ref 30.0–36.0)
MCV: 92 fL (ref 80.0–100.0)
Monocytes Absolute: 0.4 10*3/uL (ref 0.1–1.0)
Monocytes Relative: 11 %
Neutro Abs: 2 10*3/uL (ref 1.7–7.7)
Neutrophils Relative %: 48 %
Platelet Count: 220 10*3/uL (ref 150–400)
RBC: 4.23 MIL/uL (ref 4.22–5.81)
RDW: 12.9 % (ref 11.5–15.5)
WBC Count: 4.1 10*3/uL (ref 4.0–10.5)
nRBC: 0 % (ref 0.0–0.2)

## 2021-10-13 LAB — CMP (CANCER CENTER ONLY)
ALT: 10 U/L (ref 0–44)
AST: 12 U/L — ABNORMAL LOW (ref 15–41)
Albumin: 4.1 g/dL (ref 3.5–5.0)
Alkaline Phosphatase: 34 U/L — ABNORMAL LOW (ref 38–126)
Anion gap: 4 — ABNORMAL LOW (ref 5–15)
BUN: 13 mg/dL (ref 6–20)
CO2: 31 mmol/L (ref 22–32)
Calcium: 9.7 mg/dL (ref 8.9–10.3)
Chloride: 103 mmol/L (ref 98–111)
Creatinine: 1.2 mg/dL (ref 0.61–1.24)
GFR, Estimated: >60 mL/min (ref >60)
Glucose, Bld: 109 mg/dL — ABNORMAL HIGH (ref 70–99)
Potassium: 3.9 mmol/L (ref 3.5–5.1)
Sodium: 138 mmol/L (ref 135–145)
Total Bilirubin: 0.6 mg/dL (ref 0.3–1.2)
Total Protein: 6.9 g/dL (ref 6.5–8.1)

## 2021-10-13 MED ORDER — DENOSUMAB 120 MG/1.7ML ~~LOC~~ SOLN
120.0000 mg | Freq: Once | SUBCUTANEOUS | Status: AC
  Administered 2021-10-13: 120 mg via SUBCUTANEOUS

## 2021-10-13 MED FILL — Sildenafil Citrate Tab 100 MG: ORAL | 4 days supply | Qty: 4 | Fill #5 | Status: AC
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
## 2021-10-13 NOTE — Progress Notes (Signed)
   Covid-19 Vaccination Clinic  Name:  Jimmy Day    MRN: [id number] DOB: 1961-03-14  10/13/2021  Mr. Shaddock was observed post Covid-19 immunization for 15 minutes without incident. He was provided with Vaccine Information Sheet and instruction to access the V-Safe system.   Mr. Klutz was instructed to call 911 with any severe reactions post vaccine: Difficulty breathing  Swelling of face and throat  A fast heartbeat  A bad rash all over body  Dizziness and weakness   Immunizations Administered     Name Date Dose VIS Date Route   Pfizer Covid-19 Vaccine Bivalent Booster 10/13/2021  9:27 AM 0.3 mL 07/23/2021 Intramuscular   Manufacturer: [place]   Lot: [id number]   [place]: [id number]

## 2021-10-13 NOTE — Patient Instructions (Signed)
Denosumab injection What is this medication? DENOSUMAB (den oh sue mab) slows bone breakdown. Prolia is used to treat osteoporosis in women after menopause and in men, and in people who are taking corticosteroids for 6 months or more. Xgeva is used to treat a high calcium level due to cancer and to prevent bone fractures and other bone problems caused by multiple myeloma or cancer bone metastases. Xgeva is also used to treat giant cell tumor of the bone. This medicine may be used for other purposes; ask your health care provider or pharmacist if you have questions. COMMON BRAND NAME(S): Prolia, XGEVA What should I tell my care team before I take this medication? They need to know if you have any of these conditions: dental disease having surgery or tooth extraction infection kidney disease low levels of calcium or Vitamin D in the blood malnutrition on hemodialysis skin conditions or sensitivity thyroid or parathyroid disease an unusual reaction to denosumab, other medicines, foods, dyes, or preservatives pregnant or trying to get pregnant breast-feeding How should I use this medication? This medicine is for injection under the skin. It is given by a health care professional in a hospital or clinic setting. A special MedGuide will be given to you before each treatment. Be sure to read this information carefully each time. For Prolia, talk to your pediatrician regarding the use of this medicine in children. Special care may be needed. For Xgeva, talk to your pediatrician regarding the use of this medicine in children. While this drug may be prescribed for children as young as 13 years for selected conditions, precautions do apply. Overdosage: If you think you have taken too much of this medicine contact a poison control center or emergency room at once. NOTE: This medicine is only for you. Do not share this medicine with others. What if I miss a dose? It is important not to miss your dose.  Call your doctor or health care professional if you are unable to keep an appointment. What may interact with this medication? Do not take this medicine with any of the following medications: other medicines containing denosumab This medicine may also interact with the following medications: medicines that lower your chance of fighting infection steroid medicines like prednisone or cortisone This list may not describe all possible interactions. Give your health care provider a list of all the medicines, herbs, non-prescription drugs, or dietary supplements you use. Also tell them if you smoke, drink alcohol, or use illegal drugs. Some items may interact with your medicine. What should I watch for while using this medication? Visit your doctor or health care professional for regular checks on your progress. Your doctor or health care professional may order blood tests and other tests to see how you are doing. Call your doctor or health care professional for advice if you get a fever, chills or sore throat, or other symptoms of a cold or flu. Do not treat yourself. This drug may decrease your body's ability to fight infection. Try to avoid being around people who are sick. You should make sure you get enough calcium and vitamin D while you are taking this medicine, unless your doctor tells you not to. Discuss the foods you eat and the vitamins you take with your health care professional. See your dentist regularly. Brush and floss your teeth as directed. Before you have any dental work done, tell your dentist you are receiving this medicine. Do not become pregnant while taking this medicine or for 5 months after   stopping it. Talk with your doctor or health care professional about your birth control options while taking this medicine. Women should inform their doctor if they wish to become pregnant or think they might be pregnant. There is a potential for serious side effects to an unborn child. Talk to  your health care professional or pharmacist for more information. What side effects may I notice from receiving this medication? Side effects that you should report to your doctor or health care professional as soon as possible: allergic reactions like skin rash, itching or hives, swelling of the face, lips, or tongue bone pain breathing problems dizziness jaw pain, especially after dental work redness, blistering, peeling of the skin signs and symptoms of infection like fever or chills; cough; sore throat; pain or trouble passing urine signs of low calcium like fast heartbeat, muscle cramps or muscle pain; pain, tingling, numbness in the hands or feet; seizures unusual bleeding or bruising unusually weak or tired Side effects that usually do not require medical attention (report to your doctor or health care professional if they continue or are bothersome): constipation diarrhea headache joint pain loss of appetite muscle pain runny nose tiredness upset stomach This list may not describe all possible side effects. Call your doctor for medical advice about side effects. You may report side effects to FDA at 1-800-FDA-1088. Where should I keep my medication? This medicine is only given in a clinic, doctor's office, or other health care setting and will not be stored at home. NOTE: This sheet is a summary. It may not cover all possible information. If you have questions about this medicine, talk to your doctor, pharmacist, or health care provider.  2022 Elsevier/Gold Standard (2018-03-18 00:00:00)

## 2021-10-14 LAB — IGG, IGA, IGM
IgA: 17 mg/dL — ABNORMAL LOW (ref 90–386)
IgG (Immunoglobin G), Serum: 1679 mg/dL — ABNORMAL HIGH (ref 603–1613)
IgM (Immunoglobulin M), Srm: 12 mg/dL — ABNORMAL LOW (ref 20–172)

## 2021-10-14 LAB — KAPPA/LAMBDA LIGHT CHAINS
Kappa free light chain: 28.8 mg/L — ABNORMAL HIGH (ref 3.3–19.4)
Kappa, lambda light chain ratio: 1.99 — ABNORMAL HIGH (ref 0.26–1.65)
Lambda free light chains: 14.5 mg/L (ref 5.7–26.3)

## 2021-10-15 LAB — PROTEIN ELECTROPHORESIS, SERUM, WITH REFLEX
A/G Ratio: 1.3 (ref 0.7–1.7)
Albumin ELP: 3.9 g/dL (ref 2.9–4.4)
Alpha-1-Globulin: 0.2 g/dL (ref 0.0–0.4)
Alpha-2-Globulin: 0.4 g/dL (ref 0.4–1.0)
Beta Globulin: 0.7 g/dL (ref 0.7–1.3)
Gamma Globulin: 1.6 g/dL (ref 0.4–1.8)
Globulin, Total: 2.9 g/dL (ref 2.2–3.9)
Total Protein ELP: 6.8 g/dL (ref 6.0–8.5)

## 2021-10-24 ENCOUNTER — Other Ambulatory Visit (HOSPITAL_BASED_OUTPATIENT_CLINIC_OR_DEPARTMENT_OTHER): Payer: Self-pay

## 2021-10-24 ENCOUNTER — Encounter: Payer: Self-pay | Admitting: Hematology & Oncology

## 2021-10-24 MED ORDER — TAMSULOSIN HCL 0.4 MG PO CAPS
ORAL_CAPSULE | ORAL | 3 refills | Status: DC
Start: 2021-10-24 — End: 2022-04-15
  Filled 2021-10-24: qty 30, 30d supply, fill #0
  Filled 2022-03-23: qty 90, 90d supply, fill #0

## 2021-10-25 ENCOUNTER — Other Ambulatory Visit (HOSPITAL_BASED_OUTPATIENT_CLINIC_OR_DEPARTMENT_OTHER): Payer: Self-pay

## 2021-10-25 MED ORDER — PFIZER COVID-19 VAC BIVALENT 30 MCG/0.3ML IM SUSP
INTRAMUSCULAR | 0 refills | Status: DC
  Filled 2021-10-25: qty 0.3, 1d supply, fill #0

## 2021-10-27 ENCOUNTER — Other Ambulatory Visit (HOSPITAL_BASED_OUTPATIENT_CLINIC_OR_DEPARTMENT_OTHER): Payer: Self-pay

## 2021-10-30 ENCOUNTER — Other Ambulatory Visit: Payer: Self-pay | Admitting: Hematology & Oncology

## 2021-10-30 DIAGNOSIS — C9000 Multiple myeloma not having achieved remission: Secondary | ICD-10-CM

## 2021-11-04 ENCOUNTER — Other Ambulatory Visit (HOSPITAL_BASED_OUTPATIENT_CLINIC_OR_DEPARTMENT_OTHER): Payer: Self-pay

## 2021-11-12 ENCOUNTER — Encounter: Payer: Self-pay | Admitting: Hematology & Oncology

## 2021-11-12 ENCOUNTER — Other Ambulatory Visit (HOSPITAL_BASED_OUTPATIENT_CLINIC_OR_DEPARTMENT_OTHER): Payer: Self-pay

## 2021-11-12 MED FILL — Sildenafil Citrate Tab 100 MG: ORAL | 4 days supply | Qty: 4 | Fill #6 | Status: AC

## 2021-11-19 ENCOUNTER — Telehealth: Payer: Self-pay

## 2021-11-19 NOTE — Telephone Encounter (Signed)
Pt called inquiring if his revlemid can cause any heart palpitations. States he started a new cycle on Saturday and the pills looked different and has has some heart fluttering since. Usually happens an hour or 2 after taking his medications. Discussed with NP who states this is not a side effect of this medication but advised pt to call his pharmacy and see if any changes were made or new manufactures and if they were having any issues with it and if not to contact his PCP to be evaluated.

## 2021-11-20 ENCOUNTER — Other Ambulatory Visit: Payer: Self-pay | Admitting: *Deleted

## 2021-11-20 DIAGNOSIS — C9000 Multiple myeloma not having achieved remission: Secondary | ICD-10-CM

## 2021-11-20 MED ORDER — LENALIDOMIDE 10 MG PO CAPS: ORAL_CAPSULE | ORAL | 0 refills | Status: DC

## 2021-12-09 ENCOUNTER — Other Ambulatory Visit (HOSPITAL_BASED_OUTPATIENT_CLINIC_OR_DEPARTMENT_OTHER): Payer: Self-pay

## 2021-12-09 ENCOUNTER — Encounter: Payer: Self-pay | Admitting: Hematology & Oncology

## 2021-12-09 MED FILL — Sildenafil Citrate Tab 100 MG: ORAL | 10 days supply | Qty: 10 | Fill #7 | Status: AC

## 2021-12-18 ENCOUNTER — Other Ambulatory Visit: Payer: Self-pay | Admitting: *Deleted

## 2021-12-18 DIAGNOSIS — C9000 Multiple myeloma not having achieved remission: Secondary | ICD-10-CM

## 2021-12-18 MED ORDER — LENALIDOMIDE 10 MG PO CAPS: ORAL_CAPSULE | ORAL | 0 refills | Status: DC

## 2022-01-07 ENCOUNTER — Other Ambulatory Visit (HOSPITAL_COMMUNITY): Payer: Self-pay

## 2022-01-13 ENCOUNTER — Inpatient Hospital Stay (HOSPITAL_BASED_OUTPATIENT_CLINIC_OR_DEPARTMENT_OTHER): Payer: 59 | Admitting: Hematology & Oncology

## 2022-01-13 ENCOUNTER — Encounter: Payer: Self-pay | Admitting: Hematology & Oncology

## 2022-01-13 ENCOUNTER — Other Ambulatory Visit: Payer: Self-pay | Admitting: Family

## 2022-01-13 ENCOUNTER — Other Ambulatory Visit: Payer: Self-pay

## 2022-01-13 ENCOUNTER — Inpatient Hospital Stay: Payer: 59 | Attending: Hematology & Oncology

## 2022-01-13 ENCOUNTER — Inpatient Hospital Stay: Payer: 59

## 2022-01-13 ENCOUNTER — Other Ambulatory Visit (HOSPITAL_BASED_OUTPATIENT_CLINIC_OR_DEPARTMENT_OTHER): Payer: Self-pay

## 2022-01-13 VITALS — BP 141/83 | HR 64 | Temp 98.6°F | Resp 18 | Wt 199.0 lb

## 2022-01-13 VITALS — BP 118/82 | HR 76 | Resp 17

## 2022-01-13 DIAGNOSIS — C9000 Multiple myeloma not having achieved remission: Secondary | ICD-10-CM

## 2022-01-13 LAB — CBC WITH DIFFERENTIAL (CANCER CENTER ONLY)
Abs Immature Granulocytes: 0.01 10*3/uL (ref 0.00–0.07)
Basophils Absolute: 0.1 10*3/uL (ref 0.0–0.1)
Basophils Relative: 2 %
Eosinophils Absolute: 0.1 10*3/uL (ref 0.0–0.5)
Eosinophils Relative: 2 %
HCT: 39.5 % (ref 39.0–52.0)
Hemoglobin: 13.9 g/dL (ref 13.0–17.0)
Immature Granulocytes: 0 %
Lymphocytes Relative: 38 %
Lymphs Abs: 1.4 10*3/uL (ref 0.7–4.0)
MCH: 31.8 pg (ref 26.0–34.0)
MCHC: 35.2 g/dL (ref 30.0–36.0)
MCV: 90.4 fL (ref 80.0–100.0)
Monocytes Absolute: 0.3 10*3/uL (ref 0.1–1.0)
Monocytes Relative: 9 %
Neutro Abs: 1.9 10*3/uL (ref 1.7–7.7)
Neutrophils Relative %: 49 %
Platelet Count: 211 10*3/uL (ref 150–400)
RBC: 4.37 MIL/uL (ref 4.22–5.81)
RDW: 13.2 % (ref 11.5–15.5)
Smear Review: NORMAL
WBC Count: 3.8 10*3/uL — ABNORMAL LOW (ref 4.0–10.5)
WBC Morphology: ABNORMAL
nRBC: 0 % (ref 0.0–0.2)

## 2022-01-13 LAB — CMP (CANCER CENTER ONLY)
ALT: 16 U/L (ref 0–44)
AST: 16 U/L (ref 15–41)
Albumin: 3.9 g/dL (ref 3.5–5.0)
Alkaline Phosphatase: 34 U/L — ABNORMAL LOW (ref 38–126)
Anion gap: 4 — ABNORMAL LOW (ref 5–15)
BUN: 13 mg/dL (ref 6–20)
CO2: 30 mmol/L (ref 22–32)
Calcium: 9.1 mg/dL (ref 8.9–10.3)
Chloride: 103 mmol/L (ref 98–111)
Creatinine: 1.29 mg/dL — ABNORMAL HIGH (ref 0.61–1.24)
GFR, Estimated: >60 mL/min (ref >60)
Glucose, Bld: 117 mg/dL — ABNORMAL HIGH (ref 70–99)
Potassium: 4 mmol/L (ref 3.5–5.1)
Sodium: 137 mmol/L (ref 135–145)
Total Bilirubin: 1 mg/dL (ref 0.3–1.2)
Total Protein: 6.8 g/dL (ref 6.5–8.1)

## 2022-01-13 MED ORDER — SILDENAFIL CITRATE 100 MG PO TABS
ORAL_TABLET | ORAL | 3 refills | Status: DC
  Filled 2022-01-13: qty 10, 10d supply, fill #0
  Filled 2022-02-18: qty 8, 8d supply, fill #0
  Filled 2022-03-23: qty 10, 10d supply, fill #1
  Filled 2022-04-27: qty 10, 10d supply, fill #2

## 2022-01-13 MED ORDER — DENOSUMAB 120 MG/1.7ML ~~LOC~~ SOLN
120.0000 mg | Freq: Once | SUBCUTANEOUS | Status: AC
  Administered 2022-01-13: 120 mg via SUBCUTANEOUS
  Filled 2022-01-13: qty 1.7

## 2022-01-13 NOTE — Patient Instructions (Signed)
[facility]  Discharge Instructions: Thank you for choosing [facility] to provide your oncology and hematology care.   If you have a lab appointment with the [facility], please go directly to the [facility] and check in at the registration area.  Wear comfortable clothing and clothing appropriate for easy access to any Portacath or PICC line.   We strive to give you quality time with your provider. You may need to reschedule your appointment if you arrive late (15 or more minutes).  Arriving late affects you and other patients whose appointments are after yours.  Also, if you miss three or more appointments without notifying the office, you may be dismissed from the clinic at the providers discretion.      For prescription refill requests, have your pharmacy contact our office and allow 72 hours for refills to be completed.    Today you received the following chemotherapy and/or immunotherapy agents xgeva       To help prevent nausea and vomiting after your treatment, we encourage you to take your nausea medication as directed.  BELOW ARE SYMPTOMS THAT SHOULD BE REPORTED IMMEDIATELY: *FEVER GREATER THAN 100.4 F (38 C) OR HIGHER *CHILLS OR SWEATING *NAUSEA AND VOMITING THAT IS NOT CONTROLLED WITH YOUR NAUSEA MEDICATION *UNUSUAL SHORTNESS OF BREATH *UNUSUAL BRUISING OR BLEEDING *URINARY PROBLEMS (pain or burning when urinating, or frequent urination) *BOWEL PROBLEMS (unusual diarrhea, constipation, pain near the anus) TENDERNESS IN MOUTH AND THROAT WITH OR WITHOUT PRESENCE OF ULCERS (sore throat, sores in mouth, or a toothache) UNUSUAL RASH, SWELLING OR PAIN  UNUSUAL VAGINAL DISCHARGE OR ITCHING   Items with * indicate a potential emergency and should be followed up as soon as possible or go to the Emergency Department if any problems should occur.  Please show the CHEMOTHERAPY ALERT CARD or IMMUNOTHERAPY ALERT CARD at check-in to the  Emergency Department and triage nurse. Should you have questions after your visit or need to cancel or reschedule your appointment, please contact [facility]  [phone] and follow the prompts.  Office hours are 8:00 a.m. to 4:30 p.m. Monday - Friday. Please note that voicemails left after 4:00 p.m. may not be returned until the following business day.  We are closed weekends and major holidays. You have access to a nurse at all times for urgent questions. Please call the main number to the clinic [phone] and follow the prompts.  For any non-urgent questions, you may also contact your provider using MyChart. We now offer e-Visits for anyone 66 and older to request care online for non-urgent symptoms. For details visit [email or web address].   Also download the MyChart app! Go to the app store, search "MyChart", open the app, select [facility], and log in with your MyChart username and password.  Due to Covid, a mask is required upon entering the hospital/clinic. If you do not have a mask, one will be given to you upon arrival. For doctor visits, patients may have 1 support person aged 20 or older with them. For treatment visits, patients cannot have anyone with them due to current Covid guidelines and our immunocompromised population.

## 2022-01-13 NOTE — Progress Notes (Signed)
Hematology and Oncology Follow Up Visit  [facility] [id number] [birth year] 61 y.o. 01/13/2022   Principle Diagnosis:  IgG Kappa myeloma  Current Therapy:   Revlimid 10 mg daily (21/7) Xgeva 120 mg sq every 3 months -- next dose May 2023   Status post autologous stem cell transplant (April 2016)   Interim History:  Mr. Richison is here today for follow-up.  He does look quite good.  He feels okay.  We last saw him back in November.  He comes in every 3 months for his Xgeva.  He and his wife will be going on a cruise at the end of March.  This will be there first cruise.   I am sure that they will have a wonderful time.  He is doing well with the Revlimid.  He had no problems taking the Revlimid.  He has not had any problems with COVID.  He has had no cough or shortness of breath.  He has had no change in bowel or bladder habits.  There has been no rashes.  He has had no fever.  His last monoclonal studies for his myeloma back in November did not show monoclonal spike in his blood.  His IgG level was 1680 mg/dL.  The Kappa light chain was 2.9 mg/dL.  Overall, I would say his performance status is ECOG 0.    Medications:  Allergies as of 01/13/2022   No Known Allergies      Medication List        Accurate as of January 13, 2022  8:43 AM. If you have any questions, ask your nurse or doctor.          acetaminophen 500 MG tablet Commonly known as: TYLENOL Take 1 tablet (500 mg total) by mouth every 6 (six) hours as needed.   amLODipine 10 MG tablet Commonly known as: NORVASC TAKE 1 TABLET BY MOUTH DAILY   aspirin 325 MG EC tablet Take 325 mg by mouth See admin instructions. Take 325 mg by mouth daily for 21 days then take 7 days off   cholecalciferol 25 MCG (1000 UNIT) tablet Commonly known as: VITAMIN D3 Take 1 tablet (1,000 Units total) by mouth daily.   cyclobenzaprine 5 MG tablet Commonly known as: FLEXERIL TAKE 1 TABLET (5 MG TOTAL) BY MOUTH 3 (THREE) TIMES  DAILY AS NEEDED FOR MUSCLE SPASMS.   cyclobenzaprine 10 MG tablet Commonly known as: FLEXERIL Take 1 tablet (10 mg total) by mouth 3 (three) times daily as needed for muscle spasms.   gabapentin 100 MG capsule Commonly known as: NEURONTIN Take 1 capsule (100 mg total) by mouth 3 (three) times daily.   Generlac 10 GM/15ML Soln Generic drug: lactulose (encephalopathy) TAKE 30 MLS (20 G TOTAL) BY MOUTH EVERY 4 (FOUR) HOURS AS NEEDED (TAKE AS DIRECTED UNTIL BOWEL MOVEMENT.).   lenalidomide 10 MG capsule Commonly known as: Revlimid TAKE 1 CAPSULE DAILY FOR 21 DAYS AND THEN 7 DAYS OFF Auth# [id number]   lidocaine 5 % Commonly known as: Lidoderm Place 1 patch onto the skin daily. Remove & Discard patch within 12 hours or as directed by MD   losartan 50 MG tablet Commonly known as: COZAAR Take 1 tablet by mouth daily   Lumify 0.025 % Soln Generic drug: Brimonidine Tartrate Place 1 drop into both eyes daily.   omega-3 acid ethyl esters 1 g capsule Commonly known as: LOVAZA Take 2 capsules (2 g total) by mouth 2 (two) times daily. What changed: how much to  take   oxyCODONE 5 MG immediate release tablet Commonly known as: Roxicodone Take 1 tablet (5 mg total) by mouth every 4 (four) hours as needed for severe pain.   oxyCODONE-acetaminophen 5-325 MG tablet Commonly known as: Percocet take 1 or 2 tablet by oral route  every 6 hours as needed for moderate or severe pain   oxyCODONE-acetaminophen 5-325 MG tablet Commonly known as: PERCOCET/ROXICET Take 1-2 tablets by mouth every 4 (four) hours as needed for moderate pain or severe pain.   Pfizer COVID-19 Vac Bivalent injection Generic drug: COVID-19 mRNA bivalent vaccine Therapist, music) Inject into the muscle.   sildenafil 100 MG tablet Commonly known as: VIAGRA TAKE 1/2 - 1 TABLET BY MOUTH DAILY AS NEEDED FOR ERECTILE DYSFUNCTION   tamsulosin 0.4 MG Caps capsule Commonly known as: FLOMAX Take 1 CAPSULE by mouth ONCE DAILY    tamsulosin 0.4 MG Caps capsule Commonly known as: FLOMAX Take 1 capsule by mouth everyday        Allergies: No Known Allergies  Past Medical History, Surgical history, Social history, and Family History were reviewed and updated.  Review of Systems: Review of Systems  Constitutional: Negative.   HENT: Negative.    Eyes: Negative.   Respiratory: Negative.    Cardiovascular: Negative.   Gastrointestinal: Negative.   Genitourinary: Negative.   Musculoskeletal: Negative.   Skin: Negative.   Neurological: Negative.   Endo/Heme/Allergies: Negative.   Psychiatric/Behavioral: Negative.      Physical Exam:  weight is 199 lb (90.3 kg). His oral temperature is 98.6 F (37 C). His blood pressure is 141/83 (abnormal) and his pulse is 64. His respiration is 18 and oxygen saturation is 100%.   Wt Readings from Last 3 Encounters:  01/13/22 199 lb (90.3 kg)  10/13/21 197 lb (89.4 kg)  06/02/21 184 lb 12.8 oz (83.8 kg)    Physical Exam Vitals reviewed.  HENT:     Head: Normocephalic and atraumatic.  Eyes:     Pupils: Pupils are equal, round, and reactive to light.  Cardiovascular:     Rate and Rhythm: Normal rate and regular rhythm.     Heart sounds: Normal heart sounds.  Pulmonary:     Effort: Pulmonary effort is normal.     Breath sounds: Normal breath sounds.  Abdominal:     General: Bowel sounds are normal.     Palpations: Abdomen is soft.  Musculoskeletal:        General: No tenderness or deformity. Normal range of motion.     Cervical back: Normal range of motion.  Lymphadenopathy:     Cervical: No cervical adenopathy.  Skin:    General: Skin is warm and dry.     Findings: No erythema or rash.  Neurological:     Mental Status: He is alert and oriented to person, place, and time.  Psychiatric:        Behavior: Behavior normal.        Thought Content: Thought content normal.        Judgment: Judgment normal.      Lab Results  Component Value Date   WBC 3.8  (L) 01/13/2022   HGB 13.9 01/13/2022   HCT 39.5 01/13/2022   MCV 90.4 01/13/2022   PLT 211 01/13/2022   Lab Results  Component Value Date   FERRITIN 152 08/17/2014   IRON 106 08/17/2014   TIBC 253 08/17/2014   UIBC 146 08/17/2014   IRONPCTSAT 42 08/17/2014   Lab Results  Component Value Date  RBC 4.37 01/13/2022   Lab Results  Component Value Date   KPAFRELGTCHN 28.8 (H) 10/13/2021   LAMBDASER 14.5 10/13/2021   KAPLAMBRATIO 1.99 (H) 10/13/2021   Lab Results  Component Value Date   IGGSERUM 1,679 (H) 10/13/2021   IGA 17 (L) 10/13/2021   IGMSERUM 12 (L) 10/13/2021   Lab Results  Component Value Date   TOTALPROTELP 6.8 10/13/2021   ALBUMINELP 3.9 10/13/2021   A1GS 0.2 10/13/2021   A2GS 0.4 10/13/2021   BETS 0.7 10/13/2021   BETA2SER 0.2 10/04/2015   GAMS 1.6 10/13/2021   MSPIKE Not Observed 10/13/2021   SPEI Comment 06/02/2021     Chemistry      Component Value Date/Time   NA 138 10/13/2021 0759   NA 139 08/13/2017 0918   NA 142 04/01/2015 0834   K 3.9 10/13/2021 0759   K 3.6 08/13/2017 0918   K 3.4 (L) 04/01/2015 0834   CL 103 10/13/2021 0759   CL 105 08/13/2017 0918   CO2 31 10/13/2021 0759   CO2 28 08/13/2017 0918   CO2 20 (L) 04/01/2015 0834   BUN 13 10/13/2021 0759   BUN 8 08/13/2017 0918   BUN 10.4 04/01/2015 0834   CREATININE 1.20 10/13/2021 0759   CREATININE 1.29 07/31/2020 0726   CREATININE 1.2 04/01/2015 0834      Component Value Date/Time   CALCIUM 9.7 10/13/2021 0759   CALCIUM 9.1 08/13/2017 0918   CALCIUM 8.6 04/01/2015 0834   ALKPHOS 34 (L) 10/13/2021 0759   ALKPHOS 42 08/13/2017 0918   ALKPHOS 39 (L) 04/01/2015 0834   AST 12 (L) 10/13/2021 0759   AST 17 04/01/2015 0834   ALT 10 10/13/2021 0759   ALT 21 08/13/2017 0918   ALT 19 04/01/2015 0834   BILITOT 0.6 10/13/2021 0759   BILITOT 0.56 04/01/2015 0834      Impression and Plan: Mr. Epps is a pleasant 61 yo African American gentleman with IgG kappa myeloma.  He had  induction therapy with RVD.  He then underwent s/p autologous stem cell transplant at [facility] in April 2016. He is doing well and is asymptomatic at this time.   He is on maintenance Revlimid.  He is doing quite nicely with this.  His myeloma, today, does not show any evidence of recurrence.  We will see what his myeloma studies are this visit.  He will get his Delton See today.  I will plan to get him back to see him in another 3 months.  At that point time, he would have been out from his transplant by 7 years.   [name], MD 2/21/20238:43 AM

## 2022-01-14 LAB — IGG, IGA, IGM
IgA: 16 mg/dL — ABNORMAL LOW (ref 90–386)
IgG (Immunoglobin G), Serum: 1586 mg/dL (ref 603–1613)
IgM (Immunoglobulin M), Srm: 13 mg/dL — ABNORMAL LOW (ref 20–172)

## 2022-01-14 LAB — KAPPA/LAMBDA LIGHT CHAINS
Kappa free light chain: 27.4 mg/L — ABNORMAL HIGH (ref 3.3–19.4)
Kappa, lambda light chain ratio: 2 — ABNORMAL HIGH (ref 0.26–1.65)
Lambda free light chains: 13.7 mg/L (ref 5.7–26.3)

## 2022-01-15 ENCOUNTER — Telehealth: Payer: Self-pay | Admitting: *Deleted

## 2022-01-15 LAB — PROTEIN ELECTROPHORESIS, SERUM, WITH REFLEX
A/G Ratio: 1.3 (ref 0.7–1.7)
Albumin ELP: 3.8 g/dL (ref 2.9–4.4)
Alpha-1-Globulin: 0.2 g/dL (ref 0.0–0.4)
Alpha-2-Globulin: 0.5 g/dL (ref 0.4–1.0)
Beta Globulin: 0.8 g/dL (ref 0.7–1.3)
Gamma Globulin: 1.6 g/dL (ref 0.4–1.8)
Globulin, Total: 3 g/dL (ref 2.2–3.9)
Total Protein ELP: 6.8 g/dL (ref 6.0–8.5)

## 2022-01-15 NOTE — Telephone Encounter (Signed)
Call received from patient stating that the ringing in his ears has intensified starting approx 2.5 months ago and would like to know if it is caused by the Lenalidomide. He also states that he has an appt with an Ear Dr on 01/25/22. Dr. Marin Olp notified.  Call placed back to patient and message left to notify pt per order of Dr. Marin Olp to hold the Lenalidomide for two weeks to see if the ringing in his ears subsides or improves.  Instructed pt to call back with any questions.

## 2022-01-21 ENCOUNTER — Other Ambulatory Visit (HOSPITAL_BASED_OUTPATIENT_CLINIC_OR_DEPARTMENT_OTHER): Payer: Self-pay

## 2022-01-22 ENCOUNTER — Other Ambulatory Visit: Payer: Self-pay | Admitting: *Deleted

## 2022-01-22 DIAGNOSIS — C9000 Multiple myeloma not having achieved remission: Secondary | ICD-10-CM

## 2022-01-22 MED ORDER — LENALIDOMIDE 10 MG PO CAPS: ORAL_CAPSULE | ORAL | 0 refills | Status: DC

## 2022-01-26 DIAGNOSIS — H903 Sensorineural hearing loss, bilateral: Secondary | ICD-10-CM | POA: Insufficient documentation

## 2022-01-26 DIAGNOSIS — H9313 Tinnitus, bilateral: Secondary | ICD-10-CM | POA: Insufficient documentation

## 2022-02-18 ENCOUNTER — Encounter: Payer: Self-pay | Admitting: Hematology & Oncology

## 2022-02-18 ENCOUNTER — Other Ambulatory Visit (HOSPITAL_BASED_OUTPATIENT_CLINIC_OR_DEPARTMENT_OTHER): Payer: Self-pay

## 2022-02-18 MED ORDER — LOSARTAN POTASSIUM 50 MG PO TABS
50.0000 mg | ORAL_TABLET | Freq: Every day | ORAL | 3 refills | Status: DC
  Filled 2022-02-18 – 2022-03-23 (×2): qty 90, 90d supply, fill #0
  Filled 2022-06-26: qty 90, 90d supply, fill #1

## 2022-02-27 ENCOUNTER — Other Ambulatory Visit (HOSPITAL_BASED_OUTPATIENT_CLINIC_OR_DEPARTMENT_OTHER): Payer: Self-pay

## 2022-03-05 ENCOUNTER — Other Ambulatory Visit: Payer: Self-pay | Admitting: Hematology & Oncology

## 2022-03-05 DIAGNOSIS — C9000 Multiple myeloma not having achieved remission: Secondary | ICD-10-CM

## 2022-03-06 ENCOUNTER — Other Ambulatory Visit: Payer: Self-pay | Admitting: *Deleted

## 2022-03-06 DIAGNOSIS — C9000 Multiple myeloma not having achieved remission: Secondary | ICD-10-CM

## 2022-03-06 MED ORDER — LENALIDOMIDE 10 MG PO CAPS: ORAL_CAPSULE | ORAL | 0 refills | Status: DC

## 2022-03-23 ENCOUNTER — Encounter: Payer: Self-pay | Admitting: Hematology & Oncology

## 2022-03-23 ENCOUNTER — Other Ambulatory Visit (HOSPITAL_BASED_OUTPATIENT_CLINIC_OR_DEPARTMENT_OTHER): Payer: Self-pay

## 2022-03-23 ENCOUNTER — Other Ambulatory Visit: Payer: Self-pay | Admitting: Family

## 2022-03-23 MED ORDER — OMEGA-3-ACID ETHYL ESTERS 1 G PO CAPS
2.0000 | ORAL_CAPSULE | Freq: Two times a day (BID) | ORAL | 0 refills | Status: DC
  Filled 2022-03-23: qty 120, 30d supply, fill #0

## 2022-04-02 ENCOUNTER — Telehealth: Payer: Self-pay | Admitting: *Deleted

## 2022-04-02 ENCOUNTER — Other Ambulatory Visit: Payer: Self-pay | Admitting: *Deleted

## 2022-04-02 DIAGNOSIS — C9000 Multiple myeloma not having achieved remission: Secondary | ICD-10-CM

## 2022-04-02 MED ORDER — LENALIDOMIDE 10 MG PO CAPS: ORAL_CAPSULE | ORAL | 0 refills | Status: DC

## 2022-04-02 NOTE — Telephone Encounter (Signed)
Message left from patient requesting a bone scan per recommendation of his nurse navigator with his insurance company.  Call placed back to patient to notify him that Dr. Marin Olp would be notified of his request.  Dr. Marin Olp notified.  ?

## 2022-04-15 ENCOUNTER — Encounter: Payer: Self-pay | Admitting: Hematology & Oncology

## 2022-04-15 ENCOUNTER — Inpatient Hospital Stay: Payer: 59 | Attending: Hematology & Oncology

## 2022-04-15 ENCOUNTER — Inpatient Hospital Stay (HOSPITAL_BASED_OUTPATIENT_CLINIC_OR_DEPARTMENT_OTHER): Payer: 59 | Admitting: Hematology & Oncology

## 2022-04-15 ENCOUNTER — Inpatient Hospital Stay: Payer: 59

## 2022-04-15 VITALS — BP 147/90 | HR 74 | Temp 97.7°F | Resp 20 | Wt 202.8 lb

## 2022-04-15 DIAGNOSIS — Z9484 Stem cells transplant status: Secondary | ICD-10-CM | POA: Diagnosis not present

## 2022-04-15 DIAGNOSIS — C9000 Multiple myeloma not having achieved remission: Secondary | ICD-10-CM | POA: Insufficient documentation

## 2022-04-15 DIAGNOSIS — Z7961 Long term (current) use of immunomodulator: Secondary | ICD-10-CM | POA: Diagnosis not present

## 2022-04-15 LAB — CBC WITH DIFFERENTIAL (CANCER CENTER ONLY)
Abs Immature Granulocytes: 0.01 10*3/uL (ref 0.00–0.07)
Basophils Absolute: 0.1 10*3/uL (ref 0.0–0.1)
Basophils Relative: 2 %
Eosinophils Absolute: 0.2 10*3/uL (ref 0.0–0.5)
Eosinophils Relative: 3 %
HCT: 37.6 % — ABNORMAL LOW (ref 39.0–52.0)
Hemoglobin: 13.3 g/dL (ref 13.0–17.0)
Immature Granulocytes: 0 %
Lymphocytes Relative: 37 %
Lymphs Abs: 1.7 10*3/uL (ref 0.7–4.0)
MCH: 32.4 pg (ref 26.0–34.0)
MCHC: 35.4 g/dL (ref 30.0–36.0)
MCV: 91.7 fL (ref 80.0–100.0)
Monocytes Absolute: 0.5 10*3/uL (ref 0.1–1.0)
Monocytes Relative: 11 %
Neutro Abs: 2.1 10*3/uL (ref 1.7–7.7)
Neutrophils Relative %: 47 %
Platelet Count: 181 10*3/uL (ref 150–400)
RBC: 4.1 MIL/uL — ABNORMAL LOW (ref 4.22–5.81)
RDW: 13.6 % (ref 11.5–15.5)
Smear Review: NORMAL
WBC Count: 4.6 10*3/uL (ref 4.0–10.5)
nRBC: 0 % (ref 0.0–0.2)

## 2022-04-15 LAB — CMP (CANCER CENTER ONLY)
ALT: 15 U/L (ref 0–44)
AST: 16 U/L (ref 15–41)
Albumin: 4.1 g/dL (ref 3.5–5.0)
Alkaline Phosphatase: 41 U/L (ref 38–126)
Anion gap: 7 (ref 5–15)
BUN: 17 mg/dL (ref 8–23)
CO2: 28 mmol/L (ref 22–32)
Calcium: 9.4 mg/dL (ref 8.9–10.3)
Chloride: 105 mmol/L (ref 98–111)
Creatinine: 1.48 mg/dL — ABNORMAL HIGH (ref 0.61–1.24)
GFR, Estimated: 53 mL/min — ABNORMAL LOW (ref >60)
Glucose, Bld: 131 mg/dL — ABNORMAL HIGH (ref 70–99)
Potassium: 3.8 mmol/L (ref 3.5–5.1)
Sodium: 140 mmol/L (ref 135–145)
Total Bilirubin: 0.5 mg/dL (ref 0.3–1.2)
Total Protein: 6.6 g/dL (ref 6.5–8.1)

## 2022-04-15 LAB — LACTATE DEHYDROGENASE: LDH: 132 U/L (ref 98–192)

## 2022-04-15 MED ORDER — DENOSUMAB 120 MG/1.7ML ~~LOC~~ SOLN
120.0000 mg | Freq: Once | SUBCUTANEOUS | Status: AC
  Administered 2022-04-15: 120 mg via SUBCUTANEOUS
  Filled 2022-04-15: qty 1.7

## 2022-04-15 NOTE — Patient Instructions (Signed)
Denosumab injection What is this medication? DENOSUMAB (den oh sue mab) slows bone breakdown. Prolia is used to treat osteoporosis in women after menopause and in men, and in people who are taking corticosteroids for 6 months or more. Xgeva is used to treat a high calcium level due to cancer and to prevent bone fractures and other bone problems caused by multiple myeloma or cancer bone metastases. Xgeva is also used to treat giant cell tumor of the bone. This medicine may be used for other purposes; ask your health care provider or pharmacist if you have questions. COMMON BRAND NAME(S): Prolia, XGEVA What should I tell my care team before I take this medication? They need to know if you have any of these conditions: dental disease having surgery or tooth extraction infection kidney disease low levels of calcium or Vitamin D in the blood malnutrition on hemodialysis skin conditions or sensitivity thyroid or parathyroid disease an unusual reaction to denosumab, other medicines, foods, dyes, or preservatives pregnant or trying to get pregnant breast-feeding How should I use this medication? This medicine is for injection under the skin. It is given by a health care professional in a hospital or clinic setting. A special MedGuide will be given to you before each treatment. Be sure to read this information carefully each time. For Prolia, talk to your pediatrician regarding the use of this medicine in children. Special care may be needed. For Xgeva, talk to your pediatrician regarding the use of this medicine in children. While this drug may be prescribed for children as young as 13 years for selected conditions, precautions do apply. Overdosage: If you think you have taken too much of this medicine contact a poison control center or emergency room at once. NOTE: This medicine is only for you. Do not share this medicine with others. What if I miss a dose? It is important not to miss your dose.  Call your doctor or health care professional if you are unable to keep an appointment. What may interact with this medication? Do not take this medicine with any of the following medications: other medicines containing denosumab This medicine may also interact with the following medications: medicines that lower your chance of fighting infection steroid medicines like prednisone or cortisone This list may not describe all possible interactions. Give your health care provider a list of all the medicines, herbs, non-prescription drugs, or dietary supplements you use. Also tell them if you smoke, drink alcohol, or use illegal drugs. Some items may interact with your medicine. What should I watch for while using this medication? Visit your doctor or health care professional for regular checks on your progress. Your doctor or health care professional may order blood tests and other tests to see how you are doing. Call your doctor or health care professional for advice if you get a fever, chills or sore throat, or other symptoms of a cold or flu. Do not treat yourself. This drug may decrease your body's ability to fight infection. Try to avoid being around people who are sick. You should make sure you get enough calcium and vitamin D while you are taking this medicine, unless your doctor tells you not to. Discuss the foods you eat and the vitamins you take with your health care professional. See your dentist regularly. Brush and floss your teeth as directed. Before you have any dental work done, tell your dentist you are receiving this medicine. Do not become pregnant while taking this medicine or for 5 months after   stopping it. Talk with your doctor or health care professional about your birth control options while taking this medicine. Women should inform their doctor if they wish to become pregnant or think they might be pregnant. There is a potential for serious side effects to an unborn child. Talk to  your health care professional or pharmacist for more information. What side effects may I notice from receiving this medication? Side effects that you should report to your doctor or health care professional as soon as possible: allergic reactions like skin rash, itching or hives, swelling of the face, lips, or tongue bone pain breathing problems dizziness jaw pain, especially after dental work redness, blistering, peeling of the skin signs and symptoms of infection like fever or chills; cough; sore throat; pain or trouble passing urine signs of low calcium like fast heartbeat, muscle cramps or muscle pain; pain, tingling, numbness in the hands or feet; seizures unusual bleeding or bruising unusually weak or tired Side effects that usually do not require medical attention (report to your doctor or health care professional if they continue or are bothersome): constipation diarrhea headache joint pain loss of appetite muscle pain runny nose tiredness upset stomach This list may not describe all possible side effects. Call your doctor for medical advice about side effects. You may report side effects to FDA at 1-800-FDA-1088. Where should I keep my medication? This medicine is only given in a clinic, doctor's office, or other health care setting and will not be stored at home. NOTE: This sheet is a summary. It may not cover all possible information. If you have questions about this medicine, talk to your doctor, pharmacist, or health care provider.  2022 Elsevier/Gold Standard (2018-03-18 00:00:00)

## 2022-04-15 NOTE — Progress Notes (Signed)
Hematology and Oncology Follow Up Visit  Jimmy Day [facility] [id number] 06-13-1961 61 y.o. 04/15/2022   Principle Diagnosis:  IgG Kappa myeloma  Current Therapy:   Revlimid 10 mg daily (21/7) Xgeva 120 mg sq every 3 months -- next dose September 2023   Status post autologous stem cell transplant (April 2016)   Interim History:  Jimmy Day is here today for follow-up.  As always, he is doing quite well.  He and his wife had a wonderful cruise.  There were down there I think in April.  They went down to the [place].  He really enjoyed himself.  He cannot wait to go on his next cruise.  Otherwise, he really is doing well.  He does look quite good.  He is still working.  He has had no problems with pain.  He has had no issues with bowels or bladder.  More last saw him, there was no monoclonal spike in his blood.  His IgG level was 1590 mg/dL.  The Kappa light chain was 2.7 mg/dL.  He has had no fever.  There is been no problems with COVID.  He is still on the Revlimid.  He is doing okay on Revlimid.  He has had no headache.  He has had no neuropathy in the hands or feet.  Overall, I would say his performance status is probably ECOG 0.    Medications:  Allergies as of 04/15/2022   No Known Allergies      Medication List        Accurate as of Apr 15, 2022  4:23 PM. If you have any questions, ask your nurse or doctor.          STOP taking these medications    acetaminophen 500 MG tablet Commonly known as: TYLENOL Stopped by: [name], MD   cyclobenzaprine 10 MG tablet Commonly known as: FLEXERIL Stopped by: [name], MD   gabapentin 100 MG capsule Commonly known as: NEURONTIN Stopped by: [name], MD   Generlac 10 GM/15ML Soln Generic drug: lactulose (encephalopathy) Stopped by: [name], MD   lidocaine 5 % Commonly known as: Lidoderm Stopped by: [name], MD   oxyCODONE 5 MG immediate release tablet Commonly known as:  Roxicodone Stopped by: [name], MD   oxyCODONE-acetaminophen 5-325 MG tablet Commonly known as: PERCOCET/ROXICET Stopped by: [name], MD   Pfizer COVID-19 Vac Bivalent injection Generic drug: COVID-19 mRNA bivalent vaccine Therapist, music) Stopped by: [name], MD       TAKE these medications    amLODipine 10 MG tablet Commonly known as: NORVASC TAKE 1 TABLET BY MOUTH DAILY   aspirin EC 325 MG tablet Take 325 mg by mouth See admin instructions. Take 325 mg by mouth daily for 21 days then take 7 days off   cholecalciferol 25 MCG (1000 UNIT) tablet Commonly known as: VITAMIN D3 Take 1 tablet (1,000 Units total) by mouth daily.   lenalidomide 10 MG capsule Commonly known as: REVLIMID TAKE 1 CAPSULE DAILY FOR 21 DAYS AND THEN 7 DAYS OFF Auth# [id number]   losartan 50 MG tablet Commonly known as: COZAAR Take 1 tablet by mouth daily   Lumify 0.025 % Soln Generic drug: Brimonidine Tartrate Place 1 drop into both eyes daily.   omega-3 acid ethyl esters 1 g capsule Commonly known as: LOVAZA Take 2 capsules (2 g total) by mouth 2 (two) times daily.   sildenafil 100 MG tablet Commonly known as: VIAGRA  TAKE 1/2 - 1 TABLET BY MOUTH DAILY AS NEEDED FOR ERECTILE DYSFUNCTION   tamsulosin 0.4 MG Caps capsule Commonly known as: FLOMAX Take 1 CAPSULE by mouth ONCE DAILY        Allergies: No Known Allergies  Past Medical History, Surgical history, Social history, and Family History were reviewed and updated.  Review of Systems: Review of Systems  Constitutional: Negative.   HENT: Negative.    Eyes: Negative.   Respiratory: Negative.    Cardiovascular: Negative.   Gastrointestinal: Negative.   Genitourinary: Negative.   Musculoskeletal: Negative.   Skin: Negative.   Neurological: Negative.   Endo/Heme/Allergies: Negative.   Psychiatric/Behavioral: Negative.      Physical Exam:  weight is 202 lb 12.8 oz (92 kg). His oral temperature is 97.7 F (36.5  C). His blood pressure is 147/90 (abnormal) and his pulse is 74. His respiration is 20 and oxygen saturation is 100%.   Wt Readings from Last 3 Encounters:  04/15/22 202 lb 12.8 oz (92 kg)  01/13/22 199 lb (90.3 kg)  10/13/21 197 lb (89.4 kg)    Physical Exam Vitals reviewed.  HENT:     Head: Normocephalic and atraumatic.  Eyes:     Pupils: Pupils are equal, round, and reactive to light.  Cardiovascular:     Rate and Rhythm: Normal rate and regular rhythm.     Heart sounds: Normal heart sounds.  Pulmonary:     Effort: Pulmonary effort is normal.     Breath sounds: Normal breath sounds.  Abdominal:     General: Bowel sounds are normal.     Palpations: Abdomen is soft.  Musculoskeletal:        General: No tenderness or deformity. Normal range of motion.     Cervical back: Normal range of motion.  Lymphadenopathy:     Cervical: No cervical adenopathy.  Skin:    General: Skin is warm and dry.     Findings: No erythema or rash.  Neurological:     Mental Status: He is alert and oriented to person, place, and time.  Psychiatric:        Behavior: Behavior normal.        Thought Content: Thought content normal.        Judgment: Judgment normal.      Lab Results  Component Value Date   WBC 4.6 04/15/2022   HGB 13.3 04/15/2022   HCT 37.6 (L) 04/15/2022   MCV 91.7 04/15/2022   PLT 181 04/15/2022   Lab Results  Component Value Date   FERRITIN 152 08/17/2014   IRON 106 08/17/2014   TIBC 253 08/17/2014   UIBC 146 08/17/2014   IRONPCTSAT 42 08/17/2014   Lab Results  Component Value Date   RBC 4.10 (L) 04/15/2022   Lab Results  Component Value Date   KPAFRELGTCHN 27.4 (H) 01/13/2022   LAMBDASER 13.7 01/13/2022   KAPLAMBRATIO 2.00 (H) 01/13/2022   Lab Results  Component Value Date   IGGSERUM 1,586 01/13/2022   IGA 16 (L) 01/13/2022   IGMSERUM 13 (L) 01/13/2022   Lab Results  Component Value Date   TOTALPROTELP 6.8 01/13/2022   ALBUMINELP 3.8 01/13/2022    A1GS 0.2 01/13/2022   A2GS 0.5 01/13/2022   BETS 0.8 01/13/2022   BETA2SER 0.2 10/04/2015   GAMS 1.6 01/13/2022   MSPIKE Not Observed 01/13/2022   SPEI Comment 06/02/2021     Chemistry      Component Value Date/Time   NA 140 04/15/2022 1513  NA 139 08/13/2017 0918   NA 142 04/01/2015 0834   K 3.8 04/15/2022 1513   K 3.6 08/13/2017 0918   K 3.4 (L) 04/01/2015 0834   CL 105 04/15/2022 1513   CL 105 08/13/2017 0918   CO2 28 04/15/2022 1513   CO2 28 08/13/2017 0918   CO2 20 (L) 04/01/2015 0834   BUN 17 04/15/2022 1513   BUN 8 08/13/2017 0918   BUN 10.4 04/01/2015 0834   CREATININE 1.48 (H) 04/15/2022 1513   CREATININE 1.29 07/31/2020 0726   CREATININE 1.2 04/01/2015 0834      Component Value Date/Time   CALCIUM 9.4 04/15/2022 1513   CALCIUM 9.1 08/13/2017 0918   CALCIUM 8.6 04/01/2015 0834   ALKPHOS 41 04/15/2022 1513   ALKPHOS 42 08/13/2017 0918   ALKPHOS 39 (L) 04/01/2015 0834   AST 16 04/15/2022 1513   AST 17 04/01/2015 0834   ALT 15 04/15/2022 1513   ALT 21 08/13/2017 0918   ALT 19 04/01/2015 0834   BILITOT 0.5 04/15/2022 1513   BILITOT 0.56 04/01/2015 0834      Impression and Plan: Jimmy Day is a pleasant 61 yo African American gentleman with IgG kappa myeloma.  He had induction therapy with RVD.  He then underwent s/p autologous stem cell transplant at [facility] in April 2016. He is doing well and is asymptomatic at this time.   He is on maintenance Revlimid.  He is doing quite nicely with this.  His myeloma, today, does not show any evidence of recurrence.  We will see what his myeloma studies are this visit.  He will get his Delton See today.  I will plan to get him back to see him in another 3 months.     [name], MD 5/24/20234:23 PM

## 2022-04-16 LAB — IGG, IGA, IGM
IgA: 15 mg/dL — ABNORMAL LOW (ref 61–437)
IgG (Immunoglobin G), Serum: 1423 mg/dL (ref 603–1613)
IgM (Immunoglobulin M), Srm: 10 mg/dL — ABNORMAL LOW (ref 20–172)

## 2022-04-16 LAB — KAPPA/LAMBDA LIGHT CHAINS
Kappa free light chain: 22.9 mg/L — ABNORMAL HIGH (ref 3.3–19.4)
Kappa, lambda light chain ratio: 1.82 — ABNORMAL HIGH (ref 0.26–1.65)
Lambda free light chains: 12.6 mg/L (ref 5.7–26.3)

## 2022-04-16 LAB — BETA 2 MICROGLOBULIN, SERUM: Beta-2 Microglobulin: 1 mg/L (ref 0.6–2.4)

## 2022-04-17 LAB — PROTEIN ELECTROPHORESIS, SERUM, WITH REFLEX
A/G Ratio: 1.7 (ref 0.7–1.7)
Albumin ELP: 4 g/dL (ref 2.9–4.4)
Alpha-1-Globulin: 0.2 g/dL (ref 0.0–0.4)
Alpha-2-Globulin: 0.4 g/dL (ref 0.4–1.0)
Beta Globulin: 0.7 g/dL (ref 0.7–1.3)
Gamma Globulin: 1.2 g/dL (ref 0.4–1.8)
Globulin, Total: 2.4 g/dL (ref 2.2–3.9)
Total Protein ELP: 6.4 g/dL (ref 6.0–8.5)

## 2022-04-27 ENCOUNTER — Encounter: Payer: Self-pay | Admitting: Hematology & Oncology

## 2022-04-27 ENCOUNTER — Other Ambulatory Visit (HOSPITAL_BASED_OUTPATIENT_CLINIC_OR_DEPARTMENT_OTHER): Payer: Self-pay

## 2022-04-28 ENCOUNTER — Other Ambulatory Visit (HOSPITAL_BASED_OUTPATIENT_CLINIC_OR_DEPARTMENT_OTHER): Payer: Self-pay

## 2022-04-29 ENCOUNTER — Emergency Department (HOSPITAL_BASED_OUTPATIENT_CLINIC_OR_DEPARTMENT_OTHER)
Admission: EM | Admit: 2022-04-29 | Discharge: 2022-04-29 | Disposition: A | Discharge status: Home / Self Care | Payer: 59 | Attending: Emergency Medicine | Admitting: Emergency Medicine

## 2022-04-29 ENCOUNTER — Other Ambulatory Visit (HOSPITAL_BASED_OUTPATIENT_CLINIC_OR_DEPARTMENT_OTHER): Payer: Self-pay

## 2022-04-29 ENCOUNTER — Other Ambulatory Visit: Payer: Self-pay

## 2022-04-29 ENCOUNTER — Emergency Department (HOSPITAL_BASED_OUTPATIENT_CLINIC_OR_DEPARTMENT_OTHER): Payer: 59

## 2022-04-29 ENCOUNTER — Encounter (HOSPITAL_BASED_OUTPATIENT_CLINIC_OR_DEPARTMENT_OTHER): Payer: Self-pay

## 2022-04-29 DIAGNOSIS — Z7982 Long term (current) use of aspirin: Secondary | ICD-10-CM | POA: Insufficient documentation

## 2022-04-29 DIAGNOSIS — Z79899 Other long term (current) drug therapy: Secondary | ICD-10-CM | POA: Insufficient documentation

## 2022-04-29 DIAGNOSIS — I10 Essential (primary) hypertension: Secondary | ICD-10-CM | POA: Insufficient documentation

## 2022-04-29 DIAGNOSIS — R109 Unspecified abdominal pain: Secondary | ICD-10-CM | POA: Diagnosis not present

## 2022-04-29 DIAGNOSIS — E860 Dehydration: Secondary | ICD-10-CM | POA: Insufficient documentation

## 2022-04-29 DIAGNOSIS — R350 Frequency of micturition: Secondary | ICD-10-CM | POA: Diagnosis present

## 2022-04-29 LAB — CBC WITH DIFFERENTIAL/PLATELET
Abs Immature Granulocytes: 0.01 10*3/uL (ref 0.00–0.07)
Basophils Absolute: 0.1 10*3/uL (ref 0.0–0.1)
Basophils Relative: 2 %
Eosinophils Absolute: 0.1 10*3/uL (ref 0.0–0.5)
Eosinophils Relative: 2 %
HCT: 38.5 % — ABNORMAL LOW (ref 39.0–52.0)
Hemoglobin: 13.4 g/dL (ref 13.0–17.0)
Immature Granulocytes: 0 %
Lymphocytes Relative: 25 %
Lymphs Abs: 1.1 10*3/uL (ref 0.7–4.0)
MCH: 32.1 pg (ref 26.0–34.0)
MCHC: 34.8 g/dL (ref 30.0–36.0)
MCV: 92.3 fL (ref 80.0–100.0)
Monocytes Absolute: 0.7 10*3/uL (ref 0.1–1.0)
Monocytes Relative: 15 %
Neutro Abs: 2.5 10*3/uL (ref 1.7–7.7)
Neutrophils Relative %: 56 %
Platelets: 181 10*3/uL (ref 150–400)
RBC: 4.17 MIL/uL — ABNORMAL LOW (ref 4.22–5.81)
RDW: 14.4 % (ref 11.5–15.5)
WBC: 4.5 10*3/uL (ref 4.0–10.5)
nRBC: 0 % (ref 0.0–0.2)

## 2022-04-29 LAB — URINALYSIS, MICROSCOPIC (REFLEX)

## 2022-04-29 LAB — COMPREHENSIVE METABOLIC PANEL
ALT: 25 U/L (ref 0–44)
AST: 28 U/L (ref 15–41)
Albumin: 3.9 g/dL (ref 3.5–5.0)
Alkaline Phosphatase: 30 U/L — ABNORMAL LOW (ref 38–126)
Anion gap: 8 (ref 5–15)
BUN: 13 mg/dL (ref 8–23)
CO2: 23 mmol/L (ref 22–32)
Calcium: 8.8 mg/dL — ABNORMAL LOW (ref 8.9–10.3)
Chloride: 104 mmol/L (ref 98–111)
Creatinine, Ser: 1.35 mg/dL — ABNORMAL HIGH (ref 0.61–1.24)
GFR, Estimated: 60 mL/min — ABNORMAL LOW (ref >60)
Glucose, Bld: 86 mg/dL (ref 70–99)
Potassium: 3.8 mmol/L (ref 3.5–5.1)
Sodium: 135 mmol/L (ref 135–145)
Total Bilirubin: 1.1 mg/dL (ref 0.3–1.2)
Total Protein: 6.9 g/dL (ref 6.5–8.1)

## 2022-04-29 LAB — URINALYSIS, ROUTINE W REFLEX MICROSCOPIC
Bilirubin Urine: NEGATIVE
Glucose, UA: NEGATIVE mg/dL
Ketones, ur: 40 mg/dL — AB
Nitrite: NEGATIVE
Protein, ur: NEGATIVE mg/dL
Specific Gravity, Urine: 1.015 (ref 1.005–1.030)
pH: 6 (ref 5.0–8.0)

## 2022-04-29 LAB — LIPASE, BLOOD: Lipase: 27 U/L (ref 11–51)

## 2022-04-29 MED ORDER — SODIUM CHLORIDE 0.9 % IV BOLUS
1000.0000 mL | Freq: Once | INTRAVENOUS | Status: AC
Start: 2022-04-29 — End: 2022-04-29
  Administered 2022-04-29: 1000 mL via INTRAVENOUS

## 2022-04-29 NOTE — ED Notes (Signed)
ED Provider at bedside. 

## 2022-04-29 NOTE — ED Provider Notes (Signed)
[facility]ERG[facility] DEPARTMENT Provider Note   CSN: [id number] Arrival date & time: 04/29/22  1006     History  Chief Complaint  Patient presents with   Urinary Retention    Jimmy Day is a 61 y.o. male.  Pt is a 61 yo male with a pmhx significant for htn, hyperlipidemia, arthritis, and IgG kappa myeloma.  He had induction therapy with RVD and then underwent a stem cell transplant at [facility].  He is maintained on Revlimid and has no evidence of recurrence as of 5/24.  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  He had to pull over at several truck stops.  He said that seems to be better now, but has bilateral flank pain.  He feels that both of his flanks are warm.  Pt denies any pain.  This am, he felt like he was unable to urinate.  He has been able to urinate since he's arrived here.        Home Medications Prior to Admission medications   Medication Sig Start Date End Date Taking? Authorizing Provider  Brimonidine Tartrate (LUMIFY) 0.025 % SOLN Place 1 drop into both eyes daily.   Yes [provider]  cholecalciferol (VITAMIN D3) 25 MCG (1000 UT) tablet Take 1 tablet (1,000 Units total) by mouth daily. 11/28/18  Yes [name], NP  losartan (COZAAR) 50 MG tablet Take 1 tablet by mouth daily 02/18/22  Yes   omega-3 acid ethyl esters (LOVAZA) 1 g capsule Take 2 capsules (2 g total) by mouth 2 (two) times daily. 03/23/22  Yes [name], NP  amLODipine (NORVASC) 10 MG tablet TAKE 1 TABLET BY MOUTH DAILY Patient not taking: Reported on 04/17/2021 10/04/20 10/04/21  Rexene Agent, MD  aspirin 325 MG EC tablet Take 325 mg by mouth See admin instructions. Take 325 mg by mouth daily for 21 days then take 7 days off    [provider]  lenalidomide (REVLIMID) 10 MG capsule TAKE 1 CAPSULE DAILY FOR 21 DAYS AND THEN 7 DAYS OFF Auth# [id number] 04/02/22   [name], MD  sildenafil (VIAGRA) 100 MG tablet TAKE 1/2 - 1 TABLET  BY MOUTH DAILY AS NEEDED FOR ERECTILE DYSFUNCTION 01/13/22 01/13/23  [name], NP  tamsulosin (FLOMAX) 0.4 MG CAPS capsule Take 1 CAPSULE by mouth ONCE DAILY 02/25/21         Allergies    Patient has no known allergies.    Review of Systems   Review of Systems  Genitourinary:  Positive for difficulty urinating, flank pain and frequency.  All other systems reviewed and are negative.  Physical Exam Updated Vital Signs BP 133/73 (BP Location: Right Arm)   Pulse 96   Temp 98.5 F (36.9 C)   Resp 18   Ht 6' (1.829 m)   Wt 91.6 kg   SpO2 99%   BMI 27.40 kg/m  Physical Exam Vitals and nursing note reviewed.  Constitutional:      Appearance: Normal appearance.  HENT:     Head: Normocephalic and atraumatic.     Right Ear: External ear normal.     Left Ear: External ear normal.     Nose: Nose normal.     Mouth/Throat:     Mouth: Mucous membranes are moist.     Pharynx: Oropharynx is clear.  Eyes:     Extraocular Movements: Extraocular movements intact.     Conjunctiva/sclera: Conjunctivae normal.     Pupils: Pupils  are equal, round, and reactive to light.  Cardiovascular:     Rate and Rhythm: Normal rate and regular rhythm.     Pulses: Normal pulses.     Heart sounds: Normal heart sounds.  Pulmonary:     Effort: Pulmonary effort is normal.     Breath sounds: Normal breath sounds.  Abdominal:     General: Abdomen is flat. Bowel sounds are normal.     Palpations: Abdomen is soft.  Musculoskeletal:        General: Normal range of motion.     Cervical back: Normal range of motion and neck supple.       Back:  Skin:    General: Skin is warm.     Capillary Refill: Capillary refill takes less than 2 seconds.  Neurological:     General: No focal deficit present.     Mental Status: He is alert and oriented to person, place, and time.  Psychiatric:        Mood and Affect: Mood normal.        Behavior: Behavior normal.    ED Results / Procedures / Treatments    Labs (all labs ordered are listed, but only abnormal results are displayed) Labs Reviewed  URINALYSIS, ROUTINE W REFLEX MICROSCOPIC - Abnormal; Notable for the following components:      Result Value   Hgb urine dipstick SMALL (*)    Ketones, ur 40 (*)    Leukocytes,Ua TRACE (*)    All other components within normal limits  CBC WITH DIFFERENTIAL/PLATELET - Abnormal; Notable for the following components:   RBC 4.17 (*)    HCT 38.5 (*)    All other components within normal limits  COMPREHENSIVE METABOLIC PANEL - Abnormal; Notable for the following components:   Creatinine, Ser 1.35 (*)    Calcium 8.8 (*)    Alkaline Phosphatase 30 (*)    GFR, Estimated 60 (*)    All other components within normal limits  URINALYSIS, MICROSCOPIC (REFLEX) - Abnormal; Notable for the following components:   Bacteria, UA FEW (*)    All other components within normal limits  URINE CULTURE  LIPASE, BLOOD    EKG None  Radiology CT Renal Stone Study  Result Date: 04/29/2022 CLINICAL DATA:  Flank pain, kidney stone suspected EXAM: CT ABDOMEN AND PELVIS WITHOUT CONTRAST TECHNIQUE: Multidetector CT imaging of the abdomen and pelvis was performed following the standard protocol without IV contrast. RADIATION DOSE REDUCTION: This exam was performed according to the departmental dose-optimization program which includes automated exposure control, adjustment of the mA and/or kV according to patient size and/or use of iterative reconstruction technique. COMPARISON:  03/25/2020 FINDINGS: Lower chest: No acute abnormality. Hepatobiliary: Too small to characterize low-density lesion of the right hepatic lobe. Gallbladder is contracted. No biliary dilatation. Pancreas: Unremarkable. Spleen: Unremarkable. Adrenals/Urinary Tract: Adrenals are unremarkable. Kidneys and bladder are unremarkable. Stomach/Bowel: Stomach is within normal limits. Bowel is normal in caliber. Normal appendix. Vascular/Lymphatic: No significant  vascular abnormality on this noncontrast study. No enlarged nodes. Reproductive: Enlarged prostate extending into the bladder base. Other: No free fluid.  Abdominal wall is unremarkable. Musculoskeletal: Numerous lucent lesions reflecting known myeloma. No pathologic fracture. IMPRESSION: No acute abnormality.  Specifically, no urinary tract calculi. Enlarged prostate. Osseous lesions reflecting known myeloma. Electronically Signed   By: Macy Mis M.D.   On: 04/29/2022 11:02    Procedures Procedures    Medications Ordered in ED Medications  sodium chloride 0.9 % bolus 1,000 mL (1,000 mLs  Intravenous New Bag/Given 04/29/22 1056)    ED Course/ Medical Decision Making/ A&P                           Medical Decision Making Amount and/or Complexity of Data Reviewed Labs: ordered. Radiology: ordered.   This patient presents to the ED for concern of urinary problems, this involves an extensive number of treatment options, and is a complaint that carries with it a high risk of complications and morbidity.  The differential diagnosis includes urinary retention, uti, kidney stones   Co morbidities that complicate the patient evaluation   htn, hyperlipidemia, arthritis, and IgG kappa myeloma.   Additional history obtained:  Additional history obtained from epic chart review  Lab Tests:  I Ordered, and personally interpreted labs.  The pertinent results include:  cbc nl, cmp nl other than 1.35; lip 27; ua with no infection + ketones   Imaging Studies ordered:  I ordered imaging studies including ct renal  I independently visualized and interpreted imaging which showed    IMPRESSION:  No acute abnormality.  Specifically, no urinary tract calculi.     Enlarged prostate.     Osseous lesions reflecting known myeloma.   I agree with the radiologist interpretation   Cardiac Monitoring:  The patient was maintained on a cardiac monitor.  I personally viewed and interpreted the  cardiac monitored which showed an underlying rhythm of: nsr   Medicines ordered and prescription drug management:  I ordered medication including ivfs  for dehydration  Reevaluation of the patient after these medicines showed that the patient improved I have reviewed the patients home medicines and have made adjustments as needed   Test Considered:  ct   Critical Interventions:  ivfs  Problem List / ED Course:  Urinary retention:  likely due to dehydration.  Pt is able to urinate and his post void residual was nl.  CT nl other than enlarged prostate.  Labs stable for pt.  Urine sent for cx, but no evidence of infection.  Pt feels better.  He is stable for d/c.  Return if worse.    Reevaluation:  After the interventions noted above, I reevaluated the patient and found that they have :improved   Social Determinants of Health:  Lives at home with his wife   Dispostion:  After consideration of the diagnostic results and the patients response to treatment, I feel that the patent would benefit from discharge with outpatient f/u.          Final Clinical Impression(s) / ED Diagnoses Final diagnoses:  Dehydration    Rx / DC Orders ED Discharge Orders     None         Isla Pence, MD 04/29/22 1148

## 2022-04-29 NOTE — ED Triage Notes (Signed)
Started with urinary frequency on 6/1, now having decrease urinary output, and "warmth" to bilateral flank. Denies pain. Hx multiple myeloma. Last took chemo pill this am.

## 2022-04-30 LAB — URINE CULTURE: Culture: NO GROWTH

## 2022-05-01 ENCOUNTER — Other Ambulatory Visit: Payer: Self-pay | Admitting: *Deleted

## 2022-05-01 DIAGNOSIS — C9000 Multiple myeloma not having achieved remission: Secondary | ICD-10-CM

## 2022-05-01 MED ORDER — LENALIDOMIDE 10 MG PO CAPS: ORAL_CAPSULE | ORAL | 0 refills | Status: DC

## 2022-05-05 ENCOUNTER — Other Ambulatory Visit: Payer: Self-pay | Admitting: Hematology & Oncology

## 2022-05-05 DIAGNOSIS — C9000 Multiple myeloma not having achieved remission: Secondary | ICD-10-CM

## 2022-05-12 ENCOUNTER — Telehealth: Payer: Self-pay | Admitting: *Deleted

## 2022-05-12 NOTE — Telephone Encounter (Signed)
Prior auth started via cover my meds.  Awaiting determination.  Key: [id number]

## 2022-05-15 NOTE — Telephone Encounter (Signed)
Medication was denied for 30 tabs.  Insurance will cover 8 tabs per 30 days and 24 tabs per 90 days.  Patient notified of this.

## 2022-05-18 ENCOUNTER — Other Ambulatory Visit (HOSPITAL_BASED_OUTPATIENT_CLINIC_OR_DEPARTMENT_OTHER): Payer: Self-pay

## 2022-05-18 ENCOUNTER — Encounter: Payer: Self-pay | Admitting: Hematology & Oncology

## 2022-05-18 MED ORDER — TADALAFIL 5 MG PO TABS
ORAL_TABLET | ORAL | 3 refills | Status: DC
  Filled 2022-05-18: qty 30, 30d supply, fill #0
  Filled 2022-05-29: qty 30, 30d supply, fill #1
  Filled 2022-06-26: qty 30, 30d supply, fill #2
  Filled 2022-08-18: qty 30, 30d supply, fill #3
  Filled 2022-09-18: qty 30, 30d supply, fill #4
  Filled 2022-10-19: qty 30, 30d supply, fill #5

## 2022-05-20 ENCOUNTER — Other Ambulatory Visit (HOSPITAL_BASED_OUTPATIENT_CLINIC_OR_DEPARTMENT_OTHER): Payer: Self-pay

## 2022-05-20 ENCOUNTER — Other Ambulatory Visit: Payer: Self-pay | Admitting: Family

## 2022-05-20 ENCOUNTER — Encounter: Payer: Self-pay | Admitting: Hematology & Oncology

## 2022-05-20 MED ORDER — OMEGA-3-ACID ETHYL ESTERS 1 G PO CAPS
2.0000 | ORAL_CAPSULE | Freq: Two times a day (BID) | ORAL | 0 refills | Status: DC
  Filled 2022-05-20: qty 120, 30d supply, fill #0

## 2022-05-27 ENCOUNTER — Encounter: Payer: Self-pay | Admitting: Hematology & Oncology

## 2022-05-29 ENCOUNTER — Other Ambulatory Visit (HOSPITAL_BASED_OUTPATIENT_CLINIC_OR_DEPARTMENT_OTHER): Payer: Self-pay

## 2022-05-29 ENCOUNTER — Encounter: Payer: Self-pay | Admitting: Hematology & Oncology

## 2022-05-29 ENCOUNTER — Ambulatory Visit (HOSPITAL_COMMUNITY)
Admission: RE | Admit: 2022-05-29 | Discharge: 2022-05-29 | Disposition: A | Discharge status: Home / Self Care | Payer: 59 | Source: Ambulatory Visit | Attending: Hematology & Oncology | Admitting: Hematology & Oncology

## 2022-05-29 ENCOUNTER — Encounter: Payer: 59 | Admitting: Family

## 2022-05-29 DIAGNOSIS — C9000 Multiple myeloma not having achieved remission: Secondary | ICD-10-CM | POA: Insufficient documentation

## 2022-05-29 LAB — GLUCOSE, CAPILLARY: Glucose-Capillary: 100 mg/dL — ABNORMAL HIGH (ref 70–99)

## 2022-05-29 MED ORDER — FLUDEOXYGLUCOSE F - 18 (FDG) INJECTION
10.1000 | Freq: Once | INTRAVENOUS | Status: AC
  Administered 2022-05-29: 10 via INTRAVENOUS

## 2022-06-01 ENCOUNTER — Telehealth: Payer: Self-pay | Admitting: *Deleted

## 2022-06-01 NOTE — Telephone Encounter (Signed)
As noted below by Dr. Marin Olp, I informed the patient that the PET scan looks great without any active Myeloma lesions noted. He verbalized understanding.

## 2022-06-01 NOTE — Telephone Encounter (Signed)
-----   Message from [name], MD sent at 06/01/2022 10:05 AM EDT ----- Call and let him know that the PET scan looks great without any active myeloma lesions noted.  Thanks.  Laurey Arrow

## 2022-06-03 ENCOUNTER — Other Ambulatory Visit: Payer: Self-pay | Admitting: Hematology & Oncology

## 2022-06-03 DIAGNOSIS — C9000 Multiple myeloma not having achieved remission: Secondary | ICD-10-CM

## 2022-06-17 ENCOUNTER — Telehealth: Payer: Self-pay | Admitting: Pharmacist

## 2022-06-17 NOTE — Telephone Encounter (Signed)
Oral Oncology Pharmacist Encounter  Prior Authorization for Lenalidomide has been approved.    PA# [id number]  Effective dates: 05/23/2022 through 06/22/2023  Patient required to continue filling through [place]. Information shared with [place].   Leron Croak, PharmD, BCPS, BCOP Hematology/Oncology Clinical Pharmacist [facility] [place] [phone] 06/17/2022 9:05 AM

## 2022-06-17 NOTE — Telephone Encounter (Signed)
Oral Oncology Pharmacist Encounter   Received notification from [employer] that prior authorization for Lenalidomide is required.   PA submitted on CoverMyMeds Key: [id number] Status is pending   Oral Oncology Clinic will continue to follow.   Leron Croak, PharmD, BCPS, BCOP Hematology/Oncology Clinical Pharmacist [facility] [place] [phone] 06/17/2022 9:04 AM

## 2022-06-26 ENCOUNTER — Encounter: Payer: Self-pay | Admitting: Hematology & Oncology

## 2022-06-26 ENCOUNTER — Encounter: Payer: Self-pay | Admitting: Family

## 2022-06-26 ENCOUNTER — Ambulatory Visit (INDEPENDENT_AMBULATORY_CARE_PROVIDER_SITE_OTHER): Payer: 59 | Admitting: Family

## 2022-06-26 ENCOUNTER — Other Ambulatory Visit (HOSPITAL_BASED_OUTPATIENT_CLINIC_OR_DEPARTMENT_OTHER): Payer: Self-pay

## 2022-06-26 ENCOUNTER — Other Ambulatory Visit: Payer: Self-pay | Admitting: Family

## 2022-06-26 VITALS — BP 131/73 | HR 63 | Temp 97.8°F | Resp 16 | Ht 72.0 in | Wt 188.0 lb

## 2022-06-26 DIAGNOSIS — C9000 Multiple myeloma not having achieved remission: Secondary | ICD-10-CM

## 2022-06-26 DIAGNOSIS — Z0000 Encounter for general adult medical examination without abnormal findings: Secondary | ICD-10-CM | POA: Diagnosis not present

## 2022-06-26 DIAGNOSIS — E538 Deficiency of other specified B group vitamins: Secondary | ICD-10-CM | POA: Diagnosis not present

## 2022-06-26 DIAGNOSIS — R739 Hyperglycemia, unspecified: Secondary | ICD-10-CM

## 2022-06-26 DIAGNOSIS — Z8249 Family history of ischemic heart disease and other diseases of the circulatory system: Secondary | ICD-10-CM | POA: Diagnosis not present

## 2022-06-26 DIAGNOSIS — I10 Essential (primary) hypertension: Secondary | ICD-10-CM

## 2022-06-26 DIAGNOSIS — R972 Elevated prostate specific antigen [PSA]: Secondary | ICD-10-CM | POA: Diagnosis not present

## 2022-06-26 DIAGNOSIS — E785 Hyperlipidemia, unspecified: Secondary | ICD-10-CM | POA: Diagnosis not present

## 2022-06-26 LAB — LIPID PANEL
Cholesterol: 167 mg/dL (ref 0–200)
HDL: 45.5 mg/dL (ref >39.00)
LDL Cholesterol: 101 mg/dL — ABNORMAL HIGH (ref 0–99)
NonHDL: 121.04
Total CHOL/HDL Ratio: 4
Triglycerides: 102 mg/dL (ref 0.0–149.0)
VLDL: 20.4 mg/dL (ref 0.0–40.0)

## 2022-06-26 LAB — VITAMIN B12: Vitamin B-12: 323 pg/mL (ref 211–911)

## 2022-06-26 LAB — HEMOGLOBIN A1C: Hgb A1c MFr Bld: 5.6 % (ref 4.6–6.5)

## 2022-06-26 MED ORDER — OMEGA-3-ACID ETHYL ESTERS 1 G PO CAPS
2.0000 | ORAL_CAPSULE | Freq: Two times a day (BID) | ORAL | 0 refills | Status: DC
  Filled 2022-06-26 – 2022-07-10 (×2): qty 30, 8d supply, fill #0
  Filled 2022-08-18: qty 60, 15d supply, fill #1
  Filled 2022-08-27: qty 30, 8d supply, fill #2

## 2022-06-26 NOTE — Telephone Encounter (Signed)
Pt seen today, labs pending

## 2022-06-26 NOTE — Progress Notes (Signed)
Subjective:   By signing my name below, I, Jimmy Day, attest that this documentation has been prepared under the direction and in the presence of [place], NP 06/26/2022     Patient ID: Jimmy Day, male    DOB: Aug 21, 1961, 61 y.o.   MRN: [id number]  Chief Complaint  Patient presents with   Hypertension    Here for follow up    HPI Patient is in today for a comprehensive physical exam  Blood Pressure: As of today's visit, his blood pressure is normal. He reports that his kidney specialist discontinued his Amlodipine medication and now he is taking 50 Mg of Losartan.  BP Readings from Last 3 Encounters:  06/26/22 131/73  04/29/22 122/78  04/15/22 (!) 147/90   Pulse Readings from Last 3 Encounters:  06/26/22 63  04/29/22 77  04/15/22 74   Erectile Dysfunction: He states that his Losartan medication is causing him erectile dysfunction, He is now taking 5 Mg of Cialis to alleviate symptoms.  Cardiology: He is requesting to receive a CRP and a referral to a cardiologist specialist.  Urination: He reports that symptoms are controlled. He is regularly following up with his urologist. He is currently taking 0.4 Mg of Flomax.  Testosterone Therapy: He reports that he is no longer doing testosterone therapy.  Lovaza: He reports that he is continuing to take two capsules of 1 g of Lovaza.  Multiple Myeloma: He reports that everything is going well with his follow-up appointments.   He denies having any fever, new muscle pain, joint pain , new moles, rashes, congestion, sinus pain, sore throat, palpations, cough, SOB ,wheezing,n/v/d constipation, blood in stool, dysuria, frequency, hematuria, depression, anxiety, headaches at this time  Social history: He reports that he surgery for his pinched nerve on 04/2021. He denies of any changes to his family medical history.  Colonoscopy: Last completed on 01/20/2018 PSA: Last completed on 08/09/2019 Immunizations: He is UTD on  his tetanus vaccine.  Diet: He is maintaining a healthy diet. He occasionally goes out to dinner but has eliminated soft drinks and processed foods from his diet.  Exercise: He is currently working out about 3-4 times a week  Wt Readings from Last 3 Encounters:  06/26/22 188 lb (85.3 kg)  04/29/22 202 lb (91.6 kg)  04/15/22 202 lb 12.8 oz (92 kg)   Dental: He is UTD on dental exams.  Vision: He is UTD on vision exams.    Health Maintenance Due  Topic Date Due   Zoster Vaccines- Shingrix (1 of 2) Never done   INFLUENZA VACCINE  06/23/2022    Past Medical History:  Diagnosis Date   Arthritis    spine   Elevated PSA 11/29/2017   Fasting hyperglycemia    NORMAL A1c   History of COVID-19 2020   Hyperlipidemia    Hypertension    Kappa light chain myeloma ([place]) 02/02/2014   Pneumonia    during covid in 2020   Testosterone deficiency    Dr Hartley Barefoot    Past Surgical History:  Procedure Laterality Date   ANTERIOR CERVICAL DECOMP/DISCECTOMY FUSION N/A 04/30/2021   Procedure: Anterior Cervical Decompression Fusion Cervical three-four, Cervical four-five, Cervical six-seven;  Surgeon: Vallarie Mare, MD;  Location: [facility];  Service: Neurosurgery;  Laterality: N/A;   CERVICAL PLATE INSERTION     POST COMPRESSION DISC INJURY   LIPOMAS REMOVAL     BENIGN FROM CHEST AND BACK    Family History  Problem Relation Age  of Onset   Breast cancer Mother    Heart attack Father 91   Kidney disease Brother        RENAL FAILURE   Hypertension Brother        (died from sepsis at 40)   Heart attack Maternal Aunt 65       CABG   Stroke Maternal Uncle        CVA   Heart attack Paternal Grandmother    Diabetes Neg Hx    Colon cancer Neg Hx    Esophageal cancer Neg Hx    Rectal cancer Neg Hx    Stomach cancer Neg Hx    Colon polyps Neg Hx     Social History   Socioeconomic History   Marital status: Married    Spouse name: Not on file   Number of children: 3   Years of education:  Not on file   Highest education level: Not on file  Occupational History   Occupation: Truck Education administrator: Scientist, physiological  Tobacco Use   Smoking status: Never   Smokeless tobacco: Never   Tobacco comments:    never used tobacco  Vaping Use   Vaping Use: Never used  Substance and Sexual Activity   Alcohol use: Not Currently    Comment:  rarely   Drug use: No   Sexual activity: Yes    Birth control/protection: Condom  Other Topics Concern   Not on file  Social History Narrative   Son in [place]   Son in [place]   9 grandchildren (83 grandsons, 3 grandchildren)   Daughter at [employer] state   Married   Truck driver   Enjoys exercise, basketball, watch sports, work around the Magazine features editor in [place] Strain: Not on file  Food Insecurity: Not on file  Transportation Needs: Not on file  Physical Activity: Not on file  Stress: Not on file  Social Connections: Not on file  Intimate Partner Violence: Not on file    Outpatient Medications Prior to Visit  Medication Sig Dispense Refill   Brimonidine Tartrate (LUMIFY) 0.025 % SOLN Place 1 drop into both eyes daily.     cholecalciferol (VITAMIN D3) 25 MCG (1000 UT) tablet Take 1 tablet (1,000 Units total) by mouth daily.     lenalidomide (REVLIMID) 10 MG capsule TAKE 1 CAPSULE DAILY FOR 21 DAYS, THEN 7 DAYS OFF 21 capsule 0   losartan (COZAAR) 50 MG tablet Take 1 tablet by mouth daily 90 tablet 3   omega-3 acid ethyl esters (LOVAZA) 1 g capsule Take 2 capsules (2 g total) by mouth 2 (two) times daily. 120 capsule 0   tadalafil (CIALIS) 5 MG tablet Take 1 tablet by mouth everyday 90 tablet 3   tamsulosin (FLOMAX) 0.4 MG CAPS capsule Take 1 CAPSULE by mouth ONCE DAILY 30 capsule 10   amLODipine (NORVASC) 10 MG tablet TAKE 1 TABLET BY MOUTH DAILY (Patient not taking: Reported on 04/17/2021) 90 tablet 3   aspirin 325 MG EC tablet Take 325 mg by mouth See admin  instructions. Take 325 mg by mouth daily for 21 days then take 7 days off     sildenafil (VIAGRA) 100 MG tablet TAKE 1/2 - 1 TABLET BY MOUTH DAILY AS NEEDED FOR ERECTILE DYSFUNCTION 30 tablet 3   No facility-administered medications prior to visit.    No Known Allergies  Review of Systems  Constitutional:  Negative  for fever.  HENT:  Negative for congestion, sinus pain and sore throat.   Respiratory:  Negative for cough, shortness of breath and wheezing.   Cardiovascular:  Negative for palpitations.  Gastrointestinal:  Negative for blood in stool, constipation, diarrhea, nausea and vomiting.  Genitourinary:  Negative for dysuria, frequency and hematuria.  Musculoskeletal:  Negative for joint pain and myalgias.  Skin:  Negative for rash.       (-) New Moles  Neurological:  Negative for headaches.  Psychiatric/Behavioral:  Negative for depression. The patient is not nervous/anxious.        Objective:    Physical Exam Constitutional:      General: He is not in acute distress.    Appearance: Normal appearance. He is not ill-appearing.  HENT:     Head: Normocephalic and atraumatic.     Right Ear: Tympanic membrane, ear canal and external ear normal.     Left Ear: Tympanic membrane, ear canal and external ear normal.     Mouth/Throat:     Pharynx: No oropharyngeal exudate.  Eyes:     Extraocular Movements: Extraocular movements intact.     Pupils: Pupils are equal, round, and reactive to light.  Cardiovascular:     Rate and Rhythm: Normal rate and regular rhythm.     Heart sounds: Normal heart sounds. No murmur heard.    No gallop.  Pulmonary:     Effort: Pulmonary effort is normal. No respiratory distress.     Breath sounds: Normal breath sounds. No wheezing or rales.  Abdominal:     General: Bowel sounds are normal. There is no distension.     Palpations: Abdomen is soft.     Tenderness: There is no abdominal tenderness. There is no guarding.  Musculoskeletal:      Comments: 5/5 strength in both upper and lower extremities    Skin:    General: Skin is warm and dry.  Neurological:     Mental Status: He is alert and oriented to person, place, and time.     Deep Tendon Reflexes:     Reflex Scores:      Patellar reflexes are 2+ on the right side and 2+ on the left side. Psychiatric:        Mood and Affect: Mood normal.        Behavior: Behavior normal.        Judgment: Judgment normal.     BP 131/73 (BP Location: Right Arm, Patient Position: Sitting, Cuff Size: Large)   Pulse 63   Temp 97.8 F (36.6 C) (Oral)   Resp 16   Ht 6' (1.829 m)   Wt 188 lb (85.3 kg)   SpO2 100%   BMI 25.50 kg/m  Wt Readings from Last 3 Encounters:  06/26/22 188 lb (85.3 kg)  04/29/22 202 lb (91.6 kg)  04/15/22 202 lb 12.8 oz (92 kg)       Assessment & Plan:   Problem List Items Addressed This Visit       Unprioritized   Kappa light chain myeloma ([place]) (Chronic)    Stable- management per oncology.        Preventative health care - Primary    Wt Readings from Last 3 Encounters:  06/26/22 188 lb (85.3 kg)  04/29/22 202 lb (91.6 kg)  04/15/22 202 lb 12.8 oz (92 kg)  Continue healthy diet and regular exercise. He has lost weight with these improvements. Colo up to date. Recommended flu shot and covid booster this fall.  Hyperlipidemia    Lab Results  Component Value Date   CHOL 138 07/31/2020   HDL 32 (L) 07/31/2020   LDLCALC 82 07/31/2020   LDLDIRECT 93.0 11/26/2017   TRIG 141 07/31/2020   CHOLHDL 4.3 07/31/2020  Will repeat level.       Relevant Orders   Lipid panel   HTN (hypertension)    BP Readings from Last 3 Encounters:  06/26/22 131/73  04/29/22 122/78  04/15/22 (!) 147/90  Pt is currently on losartan.        Family history of early CAD    He is concerned about his CV risk due to family hx.       Relevant Orders   Ambulatory referral to Cardiology   Elevated PSA     States that [facility] is monitoring his PSA  and that it is stable/improved.       B12 deficiency   Relevant Orders   B12   Other Visit Diagnoses     Hyperglycemia       Relevant Orders   Hemoglobin A1c       No orders of the defined types were placed in this encounter.   I, Nance Pear, NP, personally preformed the services described in this documentation.  All medical record entries made by the scribe were at my direction and in my presence.  I have reviewed the chart and discharge instructions (if applicable) and agree that the record reflects my personal performance and is accurate and complete. 06/26/2022   I,Amber Collins,acting as a scribe for Nance Pear, NP.,have documented all relevant documentation on the behalf of Nance Pear, NP,as directed by  Nance Pear, NP while in the presence of Nance Pear, NP.    Nance Pear, NP

## 2022-06-26 NOTE — Assessment & Plan Note (Signed)
Lab Results  Component Value Date   CHOL 138 07/31/2020   HDL 32 (L) 07/31/2020   LDLCALC 82 07/31/2020   LDLDIRECT 93.0 11/26/2017   TRIG 141 07/31/2020   CHOLHDL 4.3 07/31/2020   Will repeat level.

## 2022-06-26 NOTE — Assessment & Plan Note (Addendum)
Wt Readings from Last 3 Encounters:  06/26/22 188 lb (85.3 kg)  04/29/22 202 lb (91.6 kg)  04/15/22 202 lb 12.8 oz (92 kg)   Continue healthy diet and regular exercise. He has lost weight with these improvements. Colo up to date. Recommended flu shot and covid booster this fall.

## 2022-06-26 NOTE — Assessment & Plan Note (Signed)
BP Readings from Last 3 Encounters:  06/26/22 131/73  04/29/22 122/78  04/15/22 (!) 147/90   Pt is currently on losartan.

## 2022-06-26 NOTE — Assessment & Plan Note (Addendum)
  States that [facility] is monitoring his PSA and that it is stable/improved.

## 2022-06-26 NOTE — Assessment & Plan Note (Signed)
He is concerned about his CV risk due to family hx.

## 2022-06-26 NOTE — Assessment & Plan Note (Signed)
Stable- management per oncology.

## 2022-06-27 ENCOUNTER — Telehealth: Payer: Self-pay | Admitting: Family

## 2022-06-27 NOTE — Telephone Encounter (Signed)
B12 level is on low normal end. I would recommend that he restart b12 injections 1000 mg IM weekly x 4 then monthly.   Sugar and cholesterol levels look great.

## 2022-06-29 NOTE — Telephone Encounter (Signed)
Lvm for patient to call for labs and to be set up for B12 shots once a week for 4 weeks

## 2022-07-01 NOTE — Telephone Encounter (Signed)
Left detail message again today for patient to call about his b12 results and provider's advise

## 2022-07-03 NOTE — Telephone Encounter (Signed)
Patient advised of results and provider. He was scheduled to start B12 injections 07/10/22

## 2022-07-05 ENCOUNTER — Telehealth: Payer: Self-pay | Admitting: Family

## 2022-07-05 NOTE — Telephone Encounter (Signed)
Please advise pt that I checked with Dr. Marin Olp and he is fine with him getting the Shingrix vaccine. He can schedule nurse visit for Shingrix #1 if he would like.

## 2022-07-06 ENCOUNTER — Other Ambulatory Visit (HOSPITAL_BASED_OUTPATIENT_CLINIC_OR_DEPARTMENT_OTHER): Payer: Self-pay

## 2022-07-06 NOTE — Telephone Encounter (Signed)
Patient advised and he will get shingles #1 this Friday 8/18 when he comes in for first B12 injection

## 2022-07-10 ENCOUNTER — Encounter: Payer: Self-pay | Admitting: Hematology & Oncology

## 2022-07-10 ENCOUNTER — Ambulatory Visit (INDEPENDENT_AMBULATORY_CARE_PROVIDER_SITE_OTHER): Payer: 59

## 2022-07-10 ENCOUNTER — Other Ambulatory Visit (HOSPITAL_BASED_OUTPATIENT_CLINIC_OR_DEPARTMENT_OTHER): Payer: Self-pay

## 2022-07-10 DIAGNOSIS — E538 Deficiency of other specified B group vitamins: Secondary | ICD-10-CM

## 2022-07-10 DIAGNOSIS — Z23 Encounter for immunization: Secondary | ICD-10-CM

## 2022-07-10 MED ORDER — CYANOCOBALAMIN 1000 MCG/ML IJ SOLN
1000.0000 ug | Freq: Once | INTRAMUSCULAR | Status: AC
  Administered 2022-07-10: 1000 ug via INTRAMUSCULAR

## 2022-07-10 NOTE — Progress Notes (Signed)
Pt here for monthly B12 injection per Melissa.   B12 1072mg given IM R deltoid, and pt tolerated injection well.  Next B12 injection scheduled for 1 week.    Pt also received Shingrix #1 today.   Shingrix 0.'5mg'$  injected into L deltoid. Pt tolerated injection well.

## 2022-07-16 ENCOUNTER — Inpatient Hospital Stay: Payer: 59 | Admitting: Hematology & Oncology

## 2022-07-16 ENCOUNTER — Inpatient Hospital Stay: Payer: 59 | Attending: Hematology & Oncology

## 2022-07-16 ENCOUNTER — Inpatient Hospital Stay: Payer: 59

## 2022-07-16 DIAGNOSIS — C9000 Multiple myeloma not having achieved remission: Secondary | ICD-10-CM | POA: Insufficient documentation

## 2022-07-17 ENCOUNTER — Ambulatory Visit (INDEPENDENT_AMBULATORY_CARE_PROVIDER_SITE_OTHER): Payer: 59

## 2022-07-17 ENCOUNTER — Other Ambulatory Visit (HOSPITAL_BASED_OUTPATIENT_CLINIC_OR_DEPARTMENT_OTHER): Payer: Self-pay

## 2022-07-17 DIAGNOSIS — E538 Deficiency of other specified B group vitamins: Secondary | ICD-10-CM | POA: Diagnosis not present

## 2022-07-17 MED ORDER — CYANOCOBALAMIN 1000 MCG/ML IJ SOLN
1000.0000 ug | Freq: Once | INTRAMUSCULAR | Status: AC
  Administered 2022-07-17: 1000 ug via INTRAMUSCULAR

## 2022-07-17 NOTE — Progress Notes (Signed)
Jimmy Day is a 60 y.o. male presents to the office today for 1/4 B12 injection  per physician's orders. Original order: " 06/27/22 B12 level is on low normal end. I would recommend that he restart b12 injections 1000 mg IM weekly x 4 then monthly. Sugar and cholesterol levels look great." B12 1042mg given IM, was administered L Deltoid today. Patient tolerated injection. Patient due for follow up labs/provider appt: No.  Patient next injection due: 1 week , appt made Yes  [place] DOD  [place] TDarlis Loan

## 2022-07-22 ENCOUNTER — Inpatient Hospital Stay: Payer: 59

## 2022-07-22 ENCOUNTER — Inpatient Hospital Stay: Payer: 59 | Admitting: Family

## 2022-07-22 ENCOUNTER — Encounter: Payer: Self-pay | Admitting: Family

## 2022-07-22 ENCOUNTER — Other Ambulatory Visit: Payer: Self-pay

## 2022-07-22 VITALS — BP 123/75 | HR 60 | Temp 98.2°F | Resp 18 | Ht 72.0 in | Wt 191.8 lb

## 2022-07-22 DIAGNOSIS — C9000 Multiple myeloma not having achieved remission: Secondary | ICD-10-CM | POA: Diagnosis not present

## 2022-07-22 LAB — CBC WITH DIFFERENTIAL (CANCER CENTER ONLY)
Abs Immature Granulocytes: 0.02 10*3/uL (ref 0.00–0.07)
Basophils Absolute: 0 10*3/uL (ref 0.0–0.1)
Basophils Relative: 1 %
Eosinophils Absolute: 0.1 10*3/uL (ref 0.0–0.5)
Eosinophils Relative: 4 %
HCT: 40 % (ref 39.0–52.0)
Hemoglobin: 13.6 g/dL (ref 13.0–17.0)
Immature Granulocytes: 1 %
Lymphocytes Relative: 32 %
Lymphs Abs: 1.3 10*3/uL (ref 0.7–4.0)
MCH: 32 pg (ref 26.0–34.0)
MCHC: 34 g/dL (ref 30.0–36.0)
MCV: 94.1 fL (ref 80.0–100.0)
Monocytes Absolute: 0.5 10*3/uL (ref 0.1–1.0)
Monocytes Relative: 13 %
Neutro Abs: 2 10*3/uL (ref 1.7–7.7)
Neutrophils Relative %: 49 %
Platelet Count: 152 10*3/uL (ref 150–400)
RBC: 4.25 MIL/uL (ref 4.22–5.81)
RDW: 13.7 % (ref 11.5–15.5)
Smear Review: NORMAL
WBC Count: 4 10*3/uL (ref 4.0–10.5)
nRBC: 0 % (ref 0.0–0.2)

## 2022-07-22 LAB — CMP (CANCER CENTER ONLY)
ALT: 18 U/L (ref 0–44)
AST: 19 U/L (ref 15–41)
Albumin: 4.3 g/dL (ref 3.5–5.0)
Alkaline Phosphatase: 38 U/L (ref 38–126)
Anion gap: 7 (ref 5–15)
BUN: 14 mg/dL (ref 8–23)
CO2: 27 mmol/L (ref 22–32)
Calcium: 9.5 mg/dL (ref 8.9–10.3)
Chloride: 103 mmol/L (ref 98–111)
Creatinine: 1.32 mg/dL — ABNORMAL HIGH (ref 0.61–1.24)
GFR, Estimated: >60 mL/min (ref >60)
Glucose, Bld: 96 mg/dL (ref 70–99)
Potassium: 3.8 mmol/L (ref 3.5–5.1)
Sodium: 137 mmol/L (ref 135–145)
Total Bilirubin: 0.5 mg/dL (ref 0.3–1.2)
Total Protein: 7 g/dL (ref 6.5–8.1)

## 2022-07-22 LAB — LACTATE DEHYDROGENASE: LDH: 144 U/L (ref 98–192)

## 2022-07-22 MED ORDER — DENOSUMAB 120 MG/1.7ML ~~LOC~~ SOLN
120.0000 mg | Freq: Once | SUBCUTANEOUS | Status: AC
  Administered 2022-07-22: 120 mg via SUBCUTANEOUS
  Filled 2022-07-22: qty 1.7

## 2022-07-22 NOTE — Progress Notes (Signed)
Hematology and Oncology Follow Up Visit  Demarquis [facility] [id number] [birth year] 61 y.o. 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 Ungaro is here today for follow-up. He is doing well and has no complaints at this time.  He just returned from a trip to [place] with his wife. They had a wonderful time.  In May, he had no M-spike detected, IgG level was 1,423 mg/dL and kappa light chains 2.29 mg/dL.  No issues with infection. No fever, chills, n/v, cough, rash, dizziness, SOB, chest pain, palpitations, abdominal pain or changes in bowel or bladder habits.  No swelling, tenderness, numbness or tingling in his extremities.  No fatigue. He I still working out several days a week.  Appetite and hydration are good. Weight is stable at 191 lbs.   ECOG Performance Status: 0 - Asymptomatic  Medications:  Allergies as of 07/22/2022   No Known Allergies      Medication List        Accurate as of July 22, 2022  2:01 PM. If you have any questions, ask your nurse or doctor.          cholecalciferol 25 MCG (1000 UNIT) tablet Commonly known as: VITAMIN D3 Take 1 tablet (1,000 Units total) by mouth daily.   lenalidomide 10 MG capsule Commonly known as: REVLIMID TAKE 1 CAPSULE DAILY FOR 21 DAYS, THEN 7 DAYS OFF   losartan 50 MG tablet Commonly known as: COZAAR Take 1 tablet by mouth daily   Lumify 0.025 % Soln Generic drug: Brimonidine Tartrate Place 1 drop into both eyes daily.   omega-3 acid ethyl esters 1 g capsule Commonly known as: LOVAZA Take 2 capsules (2 g total) by mouth 2 (two) times daily.   tadalafil 5 MG tablet Commonly known as: CIALIS Take 1 tablet by mouth everyday   tamsulosin 0.4 MG Caps capsule Commonly known as: FLOMAX Take 1 CAPSULE by mouth ONCE DAILY         Allergies: No Known Allergies  Past Medical History, Surgical history, Social history, and Family History were reviewed and updated.  Review of Systems: All other 10 point review of systems is negative.   Physical Exam:  vitals were not taken for this visit.   Wt Readings from Last 3 Encounters:  06/26/22 188 lb (85.3 kg)  04/29/22 202 lb (91.6 kg)  04/15/22 202 lb 12.8 oz (92 kg)    Ocular: Sclerae unicteric, pupils equal, round and reactive to light Ear-nose-throat: Oropharynx clear, dentition fair Lymphatic: No cervical or supraclavicular adenopathy Lungs no rales or rhonchi, good excursion bilaterally Heart regular rate and rhythm, no murmur appreciated Abd soft, nontender, positive bowel sounds MSK no focal spinal tenderness, no joint edema Neuro: non-focal, well-oriented, appropriate affect Breasts: Deferred   Lab Results  Component Value Date   WBC 4.5 04/29/2022   HGB 13.4 04/29/2022   HCT 38.5 (L) 04/29/2022   MCV 92.3 04/29/2022   PLT 181 04/29/2022   Lab Results  Component Value Date   FERRITIN 152 08/17/2014   IRON 106 08/17/2014   TIBC 253 08/17/2014   UIBC 146 08/17/2014   IRONPCTSAT 42 08/17/2014   Lab Results  Component Value Date   RBC 4.17 (L) 04/29/2022   Lab Results  Component Value Date   KPAFRELGTCHN 22.9 (H) 04/15/2022   LAMBDASER 12.6 04/15/2022   KAPLAMBRATIO 1.82 (H) 04/15/2022   Lab Results  Component Value Date   IGGSERUM 1,423 04/15/2022   IGA 15 (L) 04/15/2022   IGMSERUM 10 (L) 04/15/2022   Lab Results  Component Value Date   TOTALPROTELP 6.4 04/15/2022   ALBUMINELP 4.0 04/15/2022   A1GS 0.2 04/15/2022   A2GS 0.4 04/15/2022   BETS 0.7 04/15/2022   BETA2SER 0.2 10/04/2015   GAMS 1.2 04/15/2022   MSPIKE Not Observed 04/15/2022   SPEI Comment 06/02/2021     Chemistry      Component Value Date/Time   NA 135 04/29/2022 1054   NA 139 08/13/2017 0918   NA 142 04/01/2015 0834   K 3.8 04/29/2022 1054   K 3.6  08/13/2017 0918   K 3.4 (L) 04/01/2015 0834   CL 104 04/29/2022 1054   CL 105 08/13/2017 0918   CO2 23 04/29/2022 1054   CO2 28 08/13/2017 0918   CO2 20 (L) 04/01/2015 0834   BUN 13 04/29/2022 1054   BUN 8 08/13/2017 0918   BUN 10.4 04/01/2015 0834   CREATININE 1.35 (H) 04/29/2022 1054   CREATININE 1.48 (H) 04/15/2022 1513   CREATININE 1.29 07/31/2020 0726   CREATININE 1.2 04/01/2015 0834      Component Value Date/Time   CALCIUM 8.8 (L) 04/29/2022 1054   CALCIUM 9.1 08/13/2017 0918   CALCIUM 8.6 04/01/2015 0834   ALKPHOS 30 (L) 04/29/2022 1054   ALKPHOS 42 08/13/2017 0918   ALKPHOS 39 (L) 04/01/2015 0834   AST 28 04/29/2022 1054   AST 16 04/15/2022 1513   AST 17 04/01/2015 0834   ALT 25 04/29/2022 1054   ALT 15 04/15/2022 1513   ALT 21 08/13/2017 0918   ALT 19 04/01/2015 0834   BILITOT 1.1 04/29/2022 1054   BILITOT 0.5 04/15/2022 1513   BILITOT 0.56 04/01/2015 0834       Impression and Plan: Mr. Landess is a pleasant 61 yo African American gentleman with IgG kappa myeloma. He received induction therapy with RVD and then underwent an autologous stem cell transplant with [facility] in April 2016.  He is doing well on maintenance Revlimid and will continue his same regimen.  Protein studies are pending.  He received Xgeva today.  Follow-up in 3 months.   Lottie Dawson, NP 8/30/20232:01 PM

## 2022-07-22 NOTE — Patient Instructions (Signed)
Denosumab Injection (Oncology) What is this medication? DENOSUMAB (den oh SUE mab) prevents weakened bones caused by cancer. It may also be used to treat noncancerous bone tumors that cannot be removed by surgery. It can also be used to treat high calcium levels in the blood caused by cancer. It works by blocking a protein that causes bones to break down quickly. This slows down the release of calcium from bones, which lowers calcium levels in your blood. It also makes your bones stronger and less likely to break (fracture). This medicine may be used for other purposes; ask your health care provider or pharmacist if you have questions. COMMON BRAND NAME(S): XGEVA What should I tell my care team before I take this medication? They need to know if you have any of these conditions: Dental disease Having surgery or tooth extraction Infection Kidney disease Low levels of calcium or vitamin D in the blood Malnutrition On hemodialysis Skin conditions or sensitivity Thyroid or parathyroid disease An unusual reaction to denosumab, other medications, foods, dyes, or preservatives Pregnant or trying to get pregnant Breast-feeding How should I use this medication? This medication is for injection under the skin. It is given by your care team in a hospital or clinic setting. A special MedGuide will be given to you before each treatment. Be sure to read this information carefully each time. Talk to your care team about the use of this medication in children. While it may be prescribed for children as young as 13 years for selected conditions, precautions do apply. Overdosage: If you think you have taken too much of this medicine contact a poison control center or emergency room at once. NOTE: This medicine is only for you. Do not share this medicine with others. What if I miss a dose? Keep appointments for follow-up doses. It is important not to miss your dose. Call your care team if you are unable to  keep an appointment. What may interact with this medication? Do not take this medication with any of the following: Other medications containing denosumab This medication may also interact with the following: Medications that lower your chance of fighting infection Steroid medications, such as prednisone or cortisone This list may not describe all possible interactions. Give your health care provider a list of all the medicines, herbs, non-prescription drugs, or dietary supplements you use. Also tell them if you smoke, drink alcohol, or use illegal drugs. Some items may interact with your medicine. What should I watch for while using this medication? Your condition will be monitored carefully while you are receiving this medication. You may need blood work while taking this medication. This medication may increase your risk of getting an infection. Call your care team for advice if you get a fever, chills, sore throat, or other symptoms of a cold or flu. Do not treat yourself. Try to avoid being around people who are sick. You should make sure you get enough calcium and vitamin D while you are taking this medication, unless your care team tells you not to. Discuss the foods you eat and the vitamins you take with your care team. Some people who take this medication have severe bone, joint, or muscle pain. This medication may also increase your risk for jaw problems or a broken thigh bone. Tell your care team right away if you have severe pain in your jaw, bones, joints, or muscles. Tell your care team if you have any pain that does not go away or that gets worse. Talk   to your care team if you may be pregnant. Serious birth defects can occur if you take this medication during pregnancy and for 5 months after the last dose. You will need a negative pregnancy test before starting this medication. Contraception is recommended while taking this medication and for 5 months after the last dose. Your care team  can help you find the option that works for you. What side effects may I notice from receiving this medication? Side effects that you should report to your care team as soon as possible: Allergic reactions--skin rash, itching, hives, swelling of the face, lips, tongue, or throat Bone, joint, or muscle pain Low calcium level--muscle pain or cramps, confusion, tingling, or numbness in the hands or feet Osteonecrosis of the jaw--pain, swelling, or redness in the mouth, numbness of the jaw, poor healing after dental work, unusual discharge from the mouth, visible bones in the mouth Side effects that usually do not require medical attention (report to your care team if they continue or are bothersome): Cough Diarrhea Fatigue Headache Nausea This list may not describe all possible side effects. Call your doctor for medical advice about side effects. You may report side effects to FDA at 1-800-FDA-1088. Where should I keep my medication? This medication is given in a hospital or clinic. It will not be stored at home. NOTE: This sheet is a summary. It may not cover all possible information. If you have questions about this medicine, talk to your doctor, pharmacist, or health care provider.  2023 Elsevier/Gold Standard (2022-03-30 00:00:00)  

## 2022-07-23 ENCOUNTER — Other Ambulatory Visit: Payer: Self-pay | Admitting: *Deleted

## 2022-07-23 DIAGNOSIS — C9000 Multiple myeloma not having achieved remission: Secondary | ICD-10-CM

## 2022-07-23 LAB — KAPPA/LAMBDA LIGHT CHAINS
Kappa free light chain: 30.2 mg/L — ABNORMAL HIGH (ref 3.3–19.4)
Kappa, lambda light chain ratio: 2.1 — ABNORMAL HIGH (ref 0.26–1.65)
Lambda free light chains: 14.4 mg/L (ref 5.7–26.3)

## 2022-07-23 LAB — IGG, IGA, IGM
IgA: 18 mg/dL — ABNORMAL LOW (ref 61–437)
IgG (Immunoglobin G), Serum: 1482 mg/dL (ref 603–1613)
IgM (Immunoglobulin M), Srm: 15 mg/dL — ABNORMAL LOW (ref 20–172)

## 2022-07-23 LAB — BETA 2 MICROGLOBULIN, SERUM: Beta-2 Microglobulin: 1.3 mg/L (ref 0.6–2.4)

## 2022-07-23 MED ORDER — LENALIDOMIDE 10 MG PO CAPS: ORAL_CAPSULE | ORAL | 0 refills | Status: DC

## 2022-07-24 ENCOUNTER — Ambulatory Visit (INDEPENDENT_AMBULATORY_CARE_PROVIDER_SITE_OTHER): Payer: 59

## 2022-07-24 DIAGNOSIS — E538 Deficiency of other specified B group vitamins: Secondary | ICD-10-CM | POA: Diagnosis not present

## 2022-07-24 MED ORDER — CYANOCOBALAMIN 1000 MCG/ML IJ SOLN
1000.0000 ug | Freq: Once | INTRAMUSCULAR | Status: AC
  Administered 2022-07-24: 1000 ug via INTRAMUSCULAR

## 2022-07-24 NOTE — Progress Notes (Signed)
Jimmy Day is a 61 y.o. male presents to the office today for 3/4 weekly B12 injections, per physician's orders. Original order: " 06/27/22 B12 level is on low normal end. I would recommend that he restart b12 injections 1000 mg IM weekly x 4 then monthly. Sugar and cholesterol levels look great." B12 1041mg given IM, was administered L Deltoid (location) today. Patient tolerated injection. Patient due for follow up labs/provider appt: No.  Patient next injection due: 1 week, appt made Yes  Creft, TKristine GarbeL

## 2022-07-28 LAB — PROTEIN ELECTROPHORESIS, SERUM, WITH REFLEX
A/G Ratio: 1.7 (ref 0.7–1.7)
Albumin ELP: 4.2 g/dL (ref 2.9–4.4)
Alpha-1-Globulin: 0.2 g/dL (ref 0.0–0.4)
Alpha-2-Globulin: 0.4 g/dL (ref 0.4–1.0)
Beta Globulin: 0.6 g/dL — ABNORMAL LOW (ref 0.7–1.3)
Gamma Globulin: 1.3 g/dL (ref 0.4–1.8)
Globulin, Total: 2.5 g/dL (ref 2.2–3.9)
Total Protein ELP: 6.7 g/dL (ref 6.0–8.5)

## 2022-07-31 ENCOUNTER — Ambulatory Visit (INDEPENDENT_AMBULATORY_CARE_PROVIDER_SITE_OTHER): Payer: 59

## 2022-07-31 DIAGNOSIS — E538 Deficiency of other specified B group vitamins: Secondary | ICD-10-CM | POA: Diagnosis not present

## 2022-07-31 MED ORDER — CYANOCOBALAMIN 1000 MCG/ML IJ SOLN
1000.0000 ug | Freq: Once | INTRAMUSCULAR | Status: AC
  Administered 2022-07-31: 1000 ug via INTRAMUSCULAR

## 2022-07-31 NOTE — Progress Notes (Signed)
Jimmy Day is a 61 y.o. male presents to the office today for 4/4 weekly B12 injections, per physician's orders. Original order: " 06/27/22 B12 level is on low normal end. I would recommend that he restart b12 injections 1000 mg IM weekly x 4 then monthly. Sugar and cholesterol levels look great." B12 1052mg given IM, was administered L Deltoid (location) today. Patient tolerated injection. Patient due for follow up labs/provider appt: No.

## 2022-08-08 ENCOUNTER — Emergency Department (HOSPITAL_BASED_OUTPATIENT_CLINIC_OR_DEPARTMENT_OTHER): Payer: 59

## 2022-08-08 ENCOUNTER — Encounter (HOSPITAL_COMMUNITY): Payer: Self-pay

## 2022-08-08 ENCOUNTER — Other Ambulatory Visit: Payer: Self-pay

## 2022-08-08 ENCOUNTER — Encounter (HOSPITAL_BASED_OUTPATIENT_CLINIC_OR_DEPARTMENT_OTHER): Payer: Self-pay | Admitting: Emergency Medicine

## 2022-08-08 ENCOUNTER — Inpatient Hospital Stay (HOSPITAL_BASED_OUTPATIENT_CLINIC_OR_DEPARTMENT_OTHER)
Admission: EM | Admit: 2022-08-08 | Discharge: 2022-08-13 | DRG: 194 | Disposition: A | Discharge status: Home / Self Care | Payer: 59 | Attending: Internal Medicine | Admitting: Internal Medicine

## 2022-08-08 DIAGNOSIS — R251 Tremor, unspecified: Secondary | ICD-10-CM | POA: Diagnosis not present

## 2022-08-08 DIAGNOSIS — E785 Hyperlipidemia, unspecified: Secondary | ICD-10-CM | POA: Diagnosis present

## 2022-08-08 DIAGNOSIS — J189 Pneumonia, unspecified organism: Secondary | ICD-10-CM | POA: Diagnosis not present

## 2022-08-08 DIAGNOSIS — Z8616 Personal history of COVID-19: Secondary | ICD-10-CM

## 2022-08-08 DIAGNOSIS — I959 Hypotension, unspecified: Secondary | ICD-10-CM

## 2022-08-08 DIAGNOSIS — N1831 Chronic kidney disease, stage 3a: Secondary | ICD-10-CM | POA: Diagnosis present

## 2022-08-08 DIAGNOSIS — M1990 Unspecified osteoarthritis, unspecified site: Secondary | ICD-10-CM | POA: Diagnosis present

## 2022-08-08 DIAGNOSIS — D631 Anemia in chronic kidney disease: Secondary | ICD-10-CM | POA: Diagnosis present

## 2022-08-08 DIAGNOSIS — R42 Dizziness and giddiness: Secondary | ICD-10-CM | POA: Diagnosis not present

## 2022-08-08 DIAGNOSIS — Z981 Arthrodesis status: Secondary | ICD-10-CM

## 2022-08-08 DIAGNOSIS — E876 Hypokalemia: Secondary | ICD-10-CM | POA: Diagnosis present

## 2022-08-08 DIAGNOSIS — Z20822 Contact with and (suspected) exposure to covid-19: Secondary | ICD-10-CM | POA: Diagnosis present

## 2022-08-08 DIAGNOSIS — Z8249 Family history of ischemic heart disease and other diseases of the circulatory system: Secondary | ICD-10-CM

## 2022-08-08 DIAGNOSIS — J181 Lobar pneumonia, unspecified organism: Secondary | ICD-10-CM | POA: Diagnosis not present

## 2022-08-08 DIAGNOSIS — R197 Diarrhea, unspecified: Secondary | ICD-10-CM | POA: Diagnosis not present

## 2022-08-08 DIAGNOSIS — Z9481 Bone marrow transplant status: Secondary | ICD-10-CM

## 2022-08-08 DIAGNOSIS — I129 Hypertensive chronic kidney disease with stage 1 through stage 4 chronic kidney disease, or unspecified chronic kidney disease: Secondary | ICD-10-CM | POA: Diagnosis present

## 2022-08-08 DIAGNOSIS — R509 Fever, unspecified: Secondary | ICD-10-CM

## 2022-08-08 DIAGNOSIS — C9000 Multiple myeloma not having achieved remission: Secondary | ICD-10-CM | POA: Diagnosis present

## 2022-08-08 DIAGNOSIS — Z79899 Other long term (current) drug therapy: Secondary | ICD-10-CM

## 2022-08-08 DIAGNOSIS — R3129 Other microscopic hematuria: Secondary | ICD-10-CM | POA: Diagnosis present

## 2022-08-08 DIAGNOSIS — Z9221 Personal history of antineoplastic chemotherapy: Secondary | ICD-10-CM

## 2022-08-08 DIAGNOSIS — C9001 Multiple myeloma in remission: Secondary | ICD-10-CM | POA: Diagnosis present

## 2022-08-08 LAB — COMPREHENSIVE METABOLIC PANEL
ALT: 13 U/L (ref 0–44)
AST: 17 U/L (ref 15–41)
Albumin: 3.5 g/dL (ref 3.5–5.0)
Alkaline Phosphatase: 30 U/L — ABNORMAL LOW (ref 38–126)
Anion gap: 9 (ref 5–15)
BUN: 16 mg/dL (ref 8–23)
CO2: 23 mmol/L (ref 22–32)
Calcium: 8.1 mg/dL — ABNORMAL LOW (ref 8.9–10.3)
Chloride: 99 mmol/L (ref 98–111)
Creatinine, Ser: 1.46 mg/dL — ABNORMAL HIGH (ref 0.61–1.24)
GFR, Estimated: 54 mL/min — ABNORMAL LOW (ref >60)
Glucose, Bld: 118 mg/dL — ABNORMAL HIGH (ref 70–99)
Potassium: 3.2 mmol/L — ABNORMAL LOW (ref 3.5–5.1)
Sodium: 131 mmol/L — ABNORMAL LOW (ref 135–145)
Total Bilirubin: 0.7 mg/dL (ref 0.3–1.2)
Total Protein: 6.9 g/dL (ref 6.5–8.1)

## 2022-08-08 LAB — URINALYSIS, ROUTINE W REFLEX MICROSCOPIC
Glucose, UA: NEGATIVE mg/dL
Ketones, ur: 40 mg/dL — AB
Leukocytes,Ua: NEGATIVE
Nitrite: NEGATIVE
Protein, ur: >=300 mg/dL — AB
Specific Gravity, Urine: >=1.03 (ref 1.005–1.030)
pH: 6 (ref 5.0–8.0)

## 2022-08-08 LAB — CBC WITH DIFFERENTIAL/PLATELET
Abs Immature Granulocytes: 0.06 10*3/uL (ref 0.00–0.07)
Basophils Absolute: 0 10*3/uL (ref 0.0–0.1)
Basophils Relative: 1 %
Eosinophils Absolute: 0.1 10*3/uL (ref 0.0–0.5)
Eosinophils Relative: 2 %
HCT: 39.8 % (ref 39.0–52.0)
Hemoglobin: 14.1 g/dL (ref 13.0–17.0)
Immature Granulocytes: 1 %
Lymphocytes Relative: 8 %
Lymphs Abs: 0.5 10*3/uL — ABNORMAL LOW (ref 0.7–4.0)
MCH: 32 pg (ref 26.0–34.0)
MCHC: 35.4 g/dL (ref 30.0–36.0)
MCV: 90.5 fL (ref 80.0–100.0)
Monocytes Absolute: 0.8 10*3/uL (ref 0.1–1.0)
Monocytes Relative: 14 %
Neutro Abs: 4.4 10*3/uL (ref 1.7–7.7)
Neutrophils Relative %: 74 %
Platelets: 147 10*3/uL — ABNORMAL LOW (ref 150–400)
RBC: 4.4 MIL/uL (ref 4.22–5.81)
RDW: 13.5 % (ref 11.5–15.5)
WBC: 5.9 10*3/uL (ref 4.0–10.5)
nRBC: 0 % (ref 0.0–0.2)

## 2022-08-08 LAB — URINALYSIS, MICROSCOPIC (REFLEX)

## 2022-08-08 LAB — LIPASE, BLOOD: Lipase: 30 U/L (ref 11–51)

## 2022-08-08 LAB — LACTIC ACID, PLASMA: Lactic Acid, Venous: 1.1 mmol/L (ref 0.5–1.9)

## 2022-08-08 LAB — RESP PANEL BY RT-PCR (FLU A&B, COVID) ARPGX2
Influenza A by PCR: NEGATIVE
Influenza B by PCR: NEGATIVE
SARS Coronavirus 2 by RT PCR: NEGATIVE

## 2022-08-08 MED ORDER — KETOROLAC TROMETHAMINE 15 MG/ML IJ SOLN
15.0000 mg | Freq: Once | INTRAMUSCULAR | Status: AC
  Administered 2022-08-08: 15 mg via INTRAVENOUS
  Filled 2022-08-08: qty 1

## 2022-08-08 MED ORDER — SODIUM CHLORIDE 0.9 % IV SOLN
1.0000 g | Freq: Once | INTRAVENOUS | Status: AC
  Administered 2022-08-08: 1 g via INTRAVENOUS
  Filled 2022-08-08: qty 10

## 2022-08-08 MED ORDER — SODIUM CHLORIDE 0.9 % IV BOLUS
1000.0000 mL | Freq: Once | INTRAVENOUS | Status: AC
  Administered 2022-08-08: 1000 mL via INTRAVENOUS

## 2022-08-08 MED ORDER — HYDROMORPHONE HCL 1 MG/ML IJ SOLN
0.5000 mg | Freq: Once | INTRAMUSCULAR | Status: AC
  Administered 2022-08-08: 0.5 mg via INTRAVENOUS
  Filled 2022-08-08: qty 1

## 2022-08-08 MED ORDER — SODIUM CHLORIDE 0.9 % IV SOLN
500.0000 mg | Freq: Once | INTRAVENOUS | Status: AC
  Administered 2022-08-09: 500 mg via INTRAVENOUS
  Filled 2022-08-08: qty 5

## 2022-08-08 MED ORDER — ACETAMINOPHEN 325 MG PO TABS
650.0000 mg | ORAL_TABLET | Freq: Once | ORAL | Status: AC | PRN
  Administered 2022-08-08: 650 mg via ORAL
  Filled 2022-08-08: qty 2

## 2022-08-08 MED ORDER — ONDANSETRON HCL 4 MG/2ML IJ SOLN
4.0000 mg | Freq: Once | INTRAMUSCULAR | Status: AC
  Administered 2022-08-08: 4 mg via INTRAVENOUS
  Filled 2022-08-08: qty 2

## 2022-08-08 NOTE — ED Notes (Signed)
One set blood cultures collected with R AC IV stick

## 2022-08-08 NOTE — ED Provider Notes (Signed)
[facility] EMERGENCY DEPARTMENT Provider Note   CSN: [id number] Arrival date & time: 08/08/22  1521     History  Chief Complaint  Patient presents with   Fever    Jimmy Day is a 61 y.o. male with a past medical history of kappa light chain myeloma, who presents to the emergency department with concerns for fever onset 3 days.  Notes that the fever was subjective.  Denies sick contacts at this time.  Has associated watery nonbloody diarrhea for the past 3 days, cough, nasal congestion, headache.  No meds tried prior to arrival.  Denies chest pain, shortness of breath, nausea, vomiting, abdominal pain, urinary symptoms.  The history is provided by the patient and the spouse. No language interpreter was used.       Home Medications Prior to Admission medications   Medication Sig Start Date End Date Taking? Authorizing Provider  Brimonidine Tartrate (LUMIFY) 0.025 % SOLN Place 1 drop into both eyes daily.   Yes [provider]  cholecalciferol (VITAMIN D3) 25 MCG (1000 UT) tablet Take 1 tablet (1,000 Units total) by mouth daily. 11/28/18  Yes [name], NP  lenalidomide (REVLIMID) 10 MG capsule TAKE 1 CAPSULE DAILY FOR 21 DAYS, THEN 7 DAYS OFF Auth# [id number] 07/23/22  Yes Ennever, Rudell Cobb, MD  losartan (COZAAR) 50 MG tablet Take 1 tablet by mouth daily 02/18/22  Yes   omega-3 acid ethyl esters (LOVAZA) 1 g capsule Take 2 capsules (2 g total) by mouth 2 (two) times daily. 06/26/22  Yes [name], NP  tadalafil (CIALIS) 5 MG tablet Take 1 tablet by mouth everyday 05/18/22  Yes   tamsulosin (FLOMAX) 0.4 MG CAPS capsule Take 1 CAPSULE by mouth ONCE DAILY 02/25/21  Yes       Allergies    Patient has no known allergies.    Review of Systems   Review of Systems  Constitutional:  Positive for fever (subjective).  HENT:  Positive for congestion and sore throat. Negative for trouble swallowing.   Respiratory:  Positive for cough. Negative for shortness of  breath.   Cardiovascular:  Negative for chest pain.  Gastrointestinal:  Positive for diarrhea. Negative for abdominal pain, nausea and vomiting.  Genitourinary:  Negative for dysuria and hematuria.  All other systems reviewed and are negative.   Physical Exam Updated Vital Signs BP 134/89   Pulse (!) 101   Temp (!) 103.1 F (39.5 C) (Oral)   Resp 18   Ht 6' (1.829 m)   Wt 87.1 kg   SpO2 97%   BMI 26.04 kg/m  Physical Exam Vitals and nursing note reviewed.  Constitutional:      General: He is not in acute distress.    Appearance: He is not diaphoretic.  HENT:     Head: Normocephalic and atraumatic.     Mouth/Throat:     Pharynx: No oropharyngeal exudate.  Eyes:     General: No scleral icterus.    Conjunctiva/sclera: Conjunctivae normal.  Cardiovascular:     Rate and Rhythm: Normal rate and regular rhythm.     Pulses: Normal pulses.     Heart sounds: Normal heart sounds.  Pulmonary:     Effort: Pulmonary effort is normal. No respiratory distress.     Breath sounds: Normal breath sounds. No wheezing.  Abdominal:     General: Bowel sounds are normal.     Palpations: Abdomen is soft. There is no mass.     Tenderness: There is no abdominal  tenderness. There is no guarding or rebound.  Musculoskeletal:        General: Normal range of motion.     Cervical back: Normal range of motion and neck supple.  Skin:    General: Skin is warm and dry.  Neurological:     Mental Status: He is alert.  Psychiatric:        Behavior: Behavior normal.     ED Results / Procedures / Treatments   Labs (all labs ordered are listed, but only abnormal results are displayed) Labs Reviewed  CBC WITH DIFFERENTIAL/PLATELET - Abnormal; Notable for the following components:      Result Value   Platelets 147 (*)    Lymphs Abs 0.5 (*)    All other components within normal limits  COMPREHENSIVE METABOLIC PANEL - Abnormal; Notable for the following components:   Sodium 131 (*)    Potassium 3.2  (*)    Glucose, Bld 118 (*)    Creatinine, Ser 1.46 (*)    Calcium 8.1 (*)    Alkaline Phosphatase 30 (*)    GFR, Estimated 54 (*)    All other components within normal limits  URINALYSIS, ROUTINE W REFLEX MICROSCOPIC - Abnormal; Notable for the following components:   Hgb urine dipstick LARGE (*)    Bilirubin Urine SMALL (*)    Ketones, ur 40 (*)    Protein, ur >=300 (*)    All other components within normal limits  URINALYSIS, MICROSCOPIC (REFLEX) - Abnormal; Notable for the following components:   Bacteria, UA MANY (*)    All other components within normal limits  RESP PANEL BY RT-PCR (FLU A&B, COVID) ARPGX2  CULTURE, BLOOD (ROUTINE X 2)  CULTURE, BLOOD (ROUTINE X 2)  LIPASE, BLOOD  LACTIC ACID, PLASMA    EKG EKG Interpretation  Date/Time:  Saturday August 08 2022 16:32:52 EDT Ventricular Rate:  96 PR Interval:  139 QRS Duration: 92 QT Interval:  336 QTC Calculation: 425 R Axis:   -51 Text Interpretation: No significant change since last tracing Sinus rhythm Left anterior fascicular block Probable anteroseptal infarct, old Baseline wander in lead(s) V4 Confirmed by Fredia Sorrow [phone]) on 08/08/2022 5:32:24 PM  Radiology CT Renal Stone Study  Result Date: 08/08/2022 CLINICAL DATA:  Hematuria. EXAM: CT ABDOMEN AND PELVIS WITHOUT CONTRAST TECHNIQUE: Multidetector CT imaging of the abdomen and pelvis was performed following the standard protocol without IV contrast. RADIATION DOSE REDUCTION: This exam was performed according to the departmental dose-optimization program which includes automated exposure control, adjustment of the mA and/or kV according to patient size and/or use of iterative reconstruction technique. COMPARISON:  CT renal stone 04/29/2022.  PET-CT 05/29/2022. FINDINGS: Lower chest: There is airspace consolidation in the visualized right lower lobe compatible with pneumonia. Hepatobiliary: Diffusely heterogeneous. This may be related to artifact from  overlying external wires. Focal lesion is difficult to exclude on this study. Gallbladder and bile ducts are within normal limits. Pancreas: Unremarkable. No pancreatic ductal dilatation or surrounding inflammatory changes. Spleen: Normal in size without focal abnormality. Adrenals/Urinary Tract: Adrenal glands are unremarkable. Kidneys are normal, without renal calculi, focal lesion, or hydronephrosis. Bladder is unremarkable. Stomach/Bowel: Stomach is within normal limits. Appendix appears normal. No evidence of bowel wall thickening, distention, or inflammatory changes. Vascular/Lymphatic: No significant vascular findings are present. No enlarged abdominal or pelvic lymph nodes. Reproductive: Prostate gland is enlarged. Other: No abdominal wall hernia or abnormality. No abdominopelvic ascites. Musculoskeletal: No acute or significant osseous findings. IMPRESSION: 1. Right lower lobe airspace consolidation  compatible with pneumonia. Recommend follow-up PA and lateral chest x-ray in 4-6 weeks to confirm complete resolution. 2. No acute localizing process in the abdomen or pelvis. 3. Prostatomegaly. Electronically Signed   By: Ronney Asters M.D.   On: 08/08/2022 21:40   CT Head Wo Contrast  Result Date: 08/08/2022 CLINICAL DATA:  Headache, new or worsening (Age >= 50y). Fever headache body aches , diarrhea . Lethargic EXAM: CT HEAD WITHOUT CONTRAST TECHNIQUE: Contiguous axial images were obtained from the base of the skull through the vertex without intravenous contrast. RADIATION DOSE REDUCTION: This exam was performed according to the departmental dose-optimization program which includes automated exposure control, adjustment of the mA and/or kV according to patient size and/or use of iterative reconstruction technique. COMPARISON:  CT head 12/28/2020 FINDINGS: Brain: No evidence of large-territorial acute infarction. No parenchymal hemorrhage. No mass lesion. No extra-axial collection. No mass effect or  midline shift. No hydrocephalus. Basilar cisterns are patent. Vascular: No hyperdense vessel. Skull: No acute fracture or focal lesion. Sinuses/Orbits: Paranasal sinuses and mastoid air cells are clear. The orbits are unremarkable. Other: None. IMPRESSION: No acute intracranial abnormality. Electronically Signed   By: Iven Finn M.D.   On: 08/08/2022 19:34   DG Chest Portable 1 View  Result Date: 08/08/2022 CLINICAL DATA:  Fever, headaches, diarrhea EXAM: PORTABLE CHEST 1 VIEW COMPARISON:  12/28/2020 FINDINGS: The heart size and mediastinal contours are within normal limits. Both lungs are clear. The visualized skeletal structures are unremarkable. IMPRESSION: No acute abnormality of the lungs in AP portable projection. Electronically Signed   By: Delanna Ahmadi M.D.   On: 08/08/2022 16:50    Procedures Procedures    Medications Ordered in ED Medications  cefTRIAXone (ROCEPHIN) 1 g in sodium chloride 0.9 % 100 mL IVPB (has no administration in time range)  azithromycin (ZITHROMAX) 500 mg in sodium chloride 0.9 % 250 mL IVPB (has no administration in time range)  acetaminophen (TYLENOL) tablet 650 mg (650 mg Oral Given 08/08/22 1545)  sodium chloride 0.9 % bolus 1,000 mL (0 mLs Intravenous Stopped 08/08/22 1831)  ketorolac (TORADOL) 15 MG/ML injection 15 mg (15 mg Intravenous Given 08/08/22 1738)  ondansetron (ZOFRAN) injection 4 mg (4 mg Intravenous Given 08/08/22 1850)  HYDROmorphone (DILAUDID) injection 0.5 mg (0.5 mg Intravenous Given 08/08/22 1850)    ED Course/ Medical Decision Making/ A&P Clinical Course as of 08/08/22 2302  Sat Aug 08, 2022  1819 Notified by RN that patient complaining of headache. Pt reassessed no focal neurological deficits noted on exam.  Strength sensation intact to bilateral upper and lower extremities.  Grip strength 5/5 bilaterally.  Patient complaining of a headache to the right side has been ongoing for 3 days.  Denies photophobia, diplopia, blurred vision.  [SB]  1946 Re-evaluated and patient resting comfortably on stretcher. Pt notes improvement of headache with treatment regimen in the ED.  [SB]  2000 Consult with Oncologist Dr. Chryl Heck regarding patient case. Oncologist notes that patient can be managed either in the outpatient setting or admitted due to elevated temp. [SB]  2213 Consult with oncocologist, Dr. Chryl Heck regarding UA findings and CT renal stone findings of PNA. Dr. Chryl Heck agrees with admission and oncology will be available for consult tomorrow. [SB]  2219 Discussed with patient and wife regarding CT findings and urinalysis findings.  Also discussed with patient and family regarding plans for admission to the hospital.  Family agreeable at this time. [SB]  2229 Consult with hospitalist, Dr. Hal Hope who agrees with admission at  this time. [SB]    Clinical Course User Index [SB] Meliya Mcconahy A, PA-C                           Medical Decision Making Amount and/or Complexity of Data Reviewed Labs: ordered. Radiology: ordered.  Risk OTC drugs. Prescription drug management. Decision regarding hospitalization.   Pt presents with subjective fever onset 3 days. Pt is being treated for kappa light chain myeloma with revlimid and Xgeva. No meds tried PTA. Pt with initial temp of 103.1 upon arrival. On exam, pt with no acute cardiovascular, respiratory, abdominal exam findings. Differential diagnosis includes PNA, COVID, flu, UTI.    Co morbidities that complicate the patient evaluation: Kappa light chain myeloma  Additional history obtained:  Additional history obtained from Spouse/Significant Other  Labs:  I ordered, and personally interpreted labs.  The pertinent results include:   Lipase unremarkable CBC without leukocytosis CMP with slightly decreased potassium at 3.2, creatinine elevated at 1.46, GFR decreased at 54 Blood cultures ordered with results pending COVID swab negative Flu swab negative Initial lactate at  1.1  Imaging: I ordered imaging studies including Chest x-ray I independently visualized and interpreted imaging which showed: no acute cardiopulmonary findings I agree with the radiologist interpretation  Medications:  I ordered medication including IVF, Zofran, Tylenol, Dilaudid, and Toradol for symptom management -Abx started in the ED (rocephin and zithromax). Reevaluation of the patient after these medicines and interventions, I reevaluated the patient and found that they have improved I have reviewed the patients home medicines and have made adjustments as needed   Consultations: I requested consultation with the Oncologist, Dr. Chryl Heck, and discussed lab and imaging findings as well as pertinent plan - they recommend: admission to the hospital due to PNA and febrile on exam.  -Consultation with Hospitalist, Dr. Hal Hope who agrees with admission at this time.   Disposition: Presentation suspicious for PNA. Doubt COVID, flu, or UTI at this time After consideration of the diagnostic results and the patients response to treatment, I feel that the patient would benefit from Admission to the hospital.  Discussed with patient and spouse at bedside regarding admission plans. Pt agreeable at this time. Pt appears safe for admission.    This chart was dictated using voice recognition software, Dragon. Despite the best efforts of this provider to proofread and correct errors, errors may still occur which can change documentation meaning.   Final Clinical Impression(s) / ED Diagnoses Final diagnoses:  Pneumonia of right lower lobe due to infectious organism  Febrile illness    Rx / DC Orders ED Discharge Orders     None         Demonie Kassa A, PA-C 08/08/22 2330    Fredia Sorrow, MD 08/09/22 1845

## 2022-08-08 NOTE — ED Notes (Signed)
Notified provider that patient states that his headache is not getting any better states that she will elevate him

## 2022-08-08 NOTE — ED Notes (Signed)
2nd set of blood cultures started with 2nd IV start

## 2022-08-08 NOTE — ED Triage Notes (Signed)
Fever headache  body aches , diarrhea . Lethargic

## 2022-08-08 NOTE — ED Notes (Addendum)
Headache chills soreness and aching weakness diarrhea x3 days Alert x4 Diarrhea 10 denies any n/v patient denies any numbness or tingling in his extremities

## 2022-08-09 ENCOUNTER — Encounter (HOSPITAL_COMMUNITY): Payer: Self-pay | Admitting: Internal Medicine

## 2022-08-09 DIAGNOSIS — R3129 Other microscopic hematuria: Secondary | ICD-10-CM | POA: Diagnosis present

## 2022-08-09 DIAGNOSIS — D631 Anemia in chronic kidney disease: Secondary | ICD-10-CM | POA: Diagnosis present

## 2022-08-09 DIAGNOSIS — Z9481 Bone marrow transplant status: Secondary | ICD-10-CM | POA: Diagnosis not present

## 2022-08-09 DIAGNOSIS — Z9221 Personal history of antineoplastic chemotherapy: Secondary | ICD-10-CM | POA: Diagnosis not present

## 2022-08-09 DIAGNOSIS — N1831 Chronic kidney disease, stage 3a: Secondary | ICD-10-CM | POA: Diagnosis present

## 2022-08-09 DIAGNOSIS — Z8579 Personal history of other malignant neoplasms of lymphoid, hematopoietic and related tissues: Secondary | ICD-10-CM | POA: Diagnosis not present

## 2022-08-09 DIAGNOSIS — E876 Hypokalemia: Secondary | ICD-10-CM | POA: Diagnosis present

## 2022-08-09 DIAGNOSIS — C9000 Multiple myeloma not having achieved remission: Secondary | ICD-10-CM

## 2022-08-09 DIAGNOSIS — J181 Lobar pneumonia, unspecified organism: Secondary | ICD-10-CM | POA: Diagnosis present

## 2022-08-09 DIAGNOSIS — R251 Tremor, unspecified: Secondary | ICD-10-CM | POA: Diagnosis not present

## 2022-08-09 DIAGNOSIS — J189 Pneumonia, unspecified organism: Secondary | ICD-10-CM | POA: Diagnosis present

## 2022-08-09 DIAGNOSIS — E785 Hyperlipidemia, unspecified: Secondary | ICD-10-CM | POA: Diagnosis present

## 2022-08-09 DIAGNOSIS — Z79899 Other long term (current) drug therapy: Secondary | ICD-10-CM | POA: Diagnosis not present

## 2022-08-09 DIAGNOSIS — M1990 Unspecified osteoarthritis, unspecified site: Secondary | ICD-10-CM | POA: Diagnosis present

## 2022-08-09 DIAGNOSIS — Z8249 Family history of ischemic heart disease and other diseases of the circulatory system: Secondary | ICD-10-CM | POA: Diagnosis not present

## 2022-08-09 DIAGNOSIS — R197 Diarrhea, unspecified: Secondary | ICD-10-CM | POA: Diagnosis not present

## 2022-08-09 DIAGNOSIS — C9001 Multiple myeloma in remission: Secondary | ICD-10-CM | POA: Diagnosis not present

## 2022-08-09 DIAGNOSIS — Z8616 Personal history of COVID-19: Secondary | ICD-10-CM | POA: Diagnosis not present

## 2022-08-09 DIAGNOSIS — Z981 Arthrodesis status: Secondary | ICD-10-CM | POA: Diagnosis not present

## 2022-08-09 DIAGNOSIS — Z20822 Contact with and (suspected) exposure to covid-19: Secondary | ICD-10-CM | POA: Diagnosis present

## 2022-08-09 DIAGNOSIS — R42 Dizziness and giddiness: Secondary | ICD-10-CM | POA: Diagnosis not present

## 2022-08-09 DIAGNOSIS — I129 Hypertensive chronic kidney disease with stage 1 through stage 4 chronic kidney disease, or unspecified chronic kidney disease: Secondary | ICD-10-CM | POA: Diagnosis present

## 2022-08-09 DIAGNOSIS — R509 Fever, unspecified: Secondary | ICD-10-CM

## 2022-08-09 DIAGNOSIS — R918 Other nonspecific abnormal finding of lung field: Secondary | ICD-10-CM | POA: Diagnosis not present

## 2022-08-09 DIAGNOSIS — I959 Hypotension, unspecified: Secondary | ICD-10-CM | POA: Diagnosis not present

## 2022-08-09 DIAGNOSIS — R059 Cough, unspecified: Secondary | ICD-10-CM | POA: Diagnosis not present

## 2022-08-09 LAB — CBC WITH DIFFERENTIAL/PLATELET
Abs Immature Granulocytes: 0.03 10*3/uL (ref 0.00–0.07)
Basophils Absolute: 0 10*3/uL (ref 0.0–0.1)
Basophils Relative: 1 %
Eosinophils Absolute: 0 10*3/uL (ref 0.0–0.5)
Eosinophils Relative: 0 %
HCT: 42 % (ref 39.0–52.0)
Hemoglobin: 14.4 g/dL (ref 13.0–17.0)
Immature Granulocytes: 1 %
Lymphocytes Relative: 11 %
Lymphs Abs: 0.6 10*3/uL — ABNORMAL LOW (ref 0.7–4.0)
MCH: 31.9 pg (ref 26.0–34.0)
MCHC: 34.3 g/dL (ref 30.0–36.0)
MCV: 92.9 fL (ref 80.0–100.0)
Monocytes Absolute: 0.5 10*3/uL (ref 0.1–1.0)
Monocytes Relative: 9 %
Neutro Abs: 4.3 10*3/uL (ref 1.7–7.7)
Neutrophils Relative %: 78 %
Platelets: 162 10*3/uL (ref 150–400)
RBC: 4.52 MIL/uL (ref 4.22–5.81)
RDW: 13.8 % (ref 11.5–15.5)
WBC: 5.5 10*3/uL (ref 4.0–10.5)
nRBC: 0 % (ref 0.0–0.2)

## 2022-08-09 LAB — BASIC METABOLIC PANEL
Anion gap: 8 (ref 5–15)
BUN: 16 mg/dL (ref 8–23)
CO2: 24 mmol/L (ref 22–32)
Calcium: 8.2 mg/dL — ABNORMAL LOW (ref 8.9–10.3)
Chloride: 101 mmol/L (ref 98–111)
Creatinine, Ser: 1.49 mg/dL — ABNORMAL HIGH (ref 0.61–1.24)
GFR, Estimated: 53 mL/min — ABNORMAL LOW (ref >60)
Glucose, Bld: 120 mg/dL — ABNORMAL HIGH (ref 70–99)
Potassium: 4.1 mmol/L (ref 3.5–5.1)
Sodium: 133 mmol/L — ABNORMAL LOW (ref 135–145)

## 2022-08-09 LAB — RESPIRATORY PANEL BY PCR

## 2022-08-09 LAB — MAGNESIUM: Magnesium: 2 mg/dL (ref 1.7–2.4)

## 2022-08-09 LAB — STREP PNEUMONIAE URINARY ANTIGEN: Strep Pneumo Urinary Antigen: NEGATIVE

## 2022-08-09 LAB — PROCALCITONIN: Procalcitonin: 0.57 ng/mL

## 2022-08-09 MED ORDER — ACETAMINOPHEN 325 MG PO TABS
650.0000 mg | ORAL_TABLET | Freq: Four times a day (QID) | ORAL | Status: DC | PRN
  Administered 2022-08-09: 650 mg via ORAL
  Filled 2022-08-09: qty 2

## 2022-08-09 MED ORDER — ACETAMINOPHEN 650 MG RE SUPP: 650.0000 mg | Freq: Four times a day (QID) | RECTAL | Status: DC | PRN

## 2022-08-09 MED ORDER — ONDANSETRON HCL 4 MG/2ML IJ SOLN: 4.0000 mg | Freq: Four times a day (QID) | INTRAMUSCULAR | Status: DC | PRN

## 2022-08-09 MED ORDER — SODIUM CHLORIDE 0.9 % IV SOLN
500.0000 mg | INTRAVENOUS | Status: DC
  Administered 2022-08-09 – 2022-08-10 (×2): 500 mg via INTRAVENOUS
  Filled 2022-08-09 (×2): qty 5

## 2022-08-09 MED ORDER — ONDANSETRON HCL 4 MG PO TABS: 4.0000 mg | ORAL_TABLET | Freq: Four times a day (QID) | ORAL | Status: DC | PRN

## 2022-08-09 MED ORDER — SODIUM CHLORIDE 0.9 % IV SOLN
1.0000 g | Freq: Once | INTRAVENOUS | Status: AC
  Administered 2022-08-09: 1 g via INTRAVENOUS
  Filled 2022-08-09: qty 10

## 2022-08-09 MED ORDER — DIPHENHYDRAMINE HCL 50 MG/ML IJ SOLN
12.5000 mg | Freq: Four times a day (QID) | INTRAMUSCULAR | Status: DC | PRN
  Administered 2022-08-09: 12.5 mg via INTRAVENOUS
  Filled 2022-08-09: qty 1

## 2022-08-09 MED ORDER — POTASSIUM CHLORIDE CRYS ER 20 MEQ PO TBCR
30.0000 meq | EXTENDED_RELEASE_TABLET | Freq: Once | ORAL | Status: AC
  Administered 2022-08-09: 30 meq via ORAL
  Filled 2022-08-09: qty 1

## 2022-08-09 MED ORDER — ACETAMINOPHEN 325 MG PO TABS
650.0000 mg | ORAL_TABLET | ORAL | Status: DC | PRN
  Administered 2022-08-09 – 2022-08-11 (×7): 650 mg via ORAL
  Filled 2022-08-09 (×7): qty 2

## 2022-08-09 MED ORDER — KETOROLAC TROMETHAMINE 15 MG/ML IJ SOLN
15.0000 mg | Freq: Four times a day (QID) | INTRAMUSCULAR | Status: DC | PRN
  Administered 2022-08-09 – 2022-08-10 (×2): 15 mg via INTRAVENOUS
  Filled 2022-08-09 (×2): qty 1

## 2022-08-09 MED ORDER — METOCLOPRAMIDE HCL 5 MG/ML IJ SOLN
10.0000 mg | Freq: Four times a day (QID) | INTRAMUSCULAR | Status: DC | PRN
  Administered 2022-08-09 – 2022-08-11 (×3): 10 mg via INTRAVENOUS
  Filled 2022-08-09 (×3): qty 2

## 2022-08-09 MED ORDER — SODIUM CHLORIDE 0.9 % IV SOLN
2.0000 g | INTRAVENOUS | Status: DC
  Administered 2022-08-09 – 2022-08-10 (×2): 2 g via INTRAVENOUS
  Filled 2022-08-09 (×2): qty 20

## 2022-08-09 MED ORDER — ENOXAPARIN SODIUM 40 MG/0.4ML IJ SOSY
40.0000 mg | PREFILLED_SYRINGE | INTRAMUSCULAR | Status: DC
  Administered 2022-08-09 – 2022-08-12 (×4): 40 mg via SUBCUTANEOUS
  Filled 2022-08-09 (×5): qty 0.4

## 2022-08-09 NOTE — H&P (Signed)
History and Physical    Patient: Jimmy Day WUJ:[id number] DOB: 1961-11-09 DOA: 08/08/2022 DOS: the patient was seen and examined on 08/09/2022 PCP: [name], NP  Patient coming from: Home  Chief Complaint:  Chief Complaint  Patient presents with   Fever   HPI: Jimmy Day is a 61 y.o. 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  Pt presents to ED with c/o fever, headache, cough, nasal congestions.  All symptoms onset 3 days ago, persistent.  No sick contacts.  Nothing makes better or worse.  No CP, SOB, N/V abd pain, dysuria.  Pt states: No neck stiffness greater than baseline (decreased ROM in neck at baseline due to ACDF history).   Review of Systems: As mentioned in the history of present illness. All other systems reviewed and are negative. Past Medical History:  Diagnosis Date   Arthritis    spine   Elevated PSA 11/29/2017   Fasting hyperglycemia    NORMAL A1c   History of COVID-19 2020   Hyperlipidemia    Hypertension    Kappa light chain myeloma ([place]) 02/02/2014   Pneumonia    during covid in 2020   Testosterone deficiency    Dr Hartley Barefoot   Past Surgical History:  Procedure Laterality Date   ANTERIOR CERVICAL DECOMP/DISCECTOMY FUSION N/A 04/30/2021   Procedure: Anterior Cervical Decompression Fusion Cervical three-four, Cervical four-five, Cervical six-seven;  Surgeon: Vallarie Mare, MD;  Location: [facility];  Service: Neurosurgery;  Laterality: N/A;   CERVICAL PLATE INSERTION     POST COMPRESSION DISC INJURY   LIPOMAS REMOVAL     BENIGN FROM CHEST AND BACK   Social History:  reports that he has never smoked. He has never used smokeless tobacco. He reports that he does not currently use alcohol. He reports that he does not use drugs.  No Known Allergies  Family History  Problem Relation Age of Onset   Breast cancer Mother    Heart attack Father 11   Kidney disease Brother         RENAL FAILURE   Hypertension Brother        (died from sepsis at 85)   Heart attack Maternal Aunt 65       CABG   Stroke Maternal Uncle        CVA   Heart attack Paternal Grandmother    Diabetes Neg Hx    Colon cancer Neg Hx    Esophageal cancer Neg Hx    Rectal cancer Neg Hx    Stomach cancer Neg Hx    Colon polyps Neg Hx     Prior to Admission medications   Medication Sig Start Date End Date Taking? Authorizing Provider  Brimonidine Tartrate (LUMIFY) 0.025 % SOLN Place 1 drop into both eyes daily.   Yes [provider]  cholecalciferol (VITAMIN D3) 25 MCG (1000 UT) tablet Take 1 tablet (1,000 Units total) by mouth daily. 11/28/18  Yes [name], NP  lenalidomide (REVLIMID) 10 MG capsule TAKE 1 CAPSULE DAILY FOR 21 DAYS, THEN 7 DAYS OFF Auth# [id number] 07/23/22  Yes Ennever, Rudell Cobb, MD  losartan (COZAAR) 50 MG tablet Take 1 tablet by mouth daily 02/18/22  Yes   omega-3 acid ethyl esters (LOVAZA) 1 g capsule Take 2 capsules (2 g total) by mouth 2 (two) times daily. 06/26/22  Yes [name], NP  tadalafil (CIALIS) 5 MG tablet Take 1 tablet by mouth everyday 05/18/22  Yes  tamsulosin (FLOMAX) 0.4 MG CAPS capsule Take 1 CAPSULE by mouth ONCE DAILY 02/25/21  Yes     Physical Exam: Vitals:   08/08/22 2313 08/09/22 0030 08/09/22 0100 08/09/22 0158  BP: 137/84 129/84 125/81 (!) 143/82  Pulse: 86 91 90 88  Resp: 18 (!) 23 (!) 21 20  Temp: (!) 101.7 F (38.7 C) (!) 100.7 F (38.2 C) 99.9 F (37.7 C) (!) 101 F (38.3 C)  TempSrc: Oral Oral Oral Oral  SpO2: 95% 96% 94% 98%  Weight:    82.9 kg  Height:    6' (1.829 m)   Constitutional: NAD, calm, comfortable Eyes: PERRL, lids and conjunctivae normal ENMT: Mucous membranes are moist. Posterior pharynx clear of any exudate or lesions.Normal dentition.  Neck: normal, no masses, no thyromegaly, Decreased ROM of neck that pt states is his baseline since ACDF in past and not any more stiff than baseline. Respiratory:  clear to auscultation bilaterally, no wheezing, no crackles. Normal respiratory effort. No accessory muscle use.  Cardiovascular: Regular rate and rhythm, no murmurs / rubs / gallops. No extremity edema. 2+ pedal pulses. No carotid bruits.  Abdomen: no tenderness, no masses palpated. No hepatosplenomegaly. Bowel sounds positive.  Musculoskeletal: no clubbing / cyanosis. No joint deformity upper and lower extremities. Good ROM, no contractures. Normal muscle tone.  Skin: no rashes, lesions, ulcers. No induration Neurologic: CN 2-12 grossly intact. Sensation intact, DTR normal. Strength 5/5 in all 4.  Psychiatric: Normal judgment and insight. Alert and oriented x 3. Normal mood.   Data Reviewed:       Latest Ref Rng & Units 08/08/2022    4:46 PM 07/22/2022    1:40 PM 04/29/2022   10:54 AM  CBC  WBC 4.0 - 10.5 K/uL 5.9  4.0  4.5   Hemoglobin 13.0 - 17.0 g/dL 14.1  13.6  13.4   Hematocrit 39.0 - 52.0 % 39.8  40.0  38.5   Platelets 150 - 400 K/uL 147  152  181       Latest Ref Rng & Units 08/08/2022    4:46 PM 07/22/2022    1:40 PM 04/29/2022   10:54 AM  CMP  Glucose 70 - 99 mg/dL 118  96  86   BUN 8 - 23 mg/dL '16  14  13   '$ Creatinine 0.61 - 1.24 mg/dL 1.46  1.32  1.35   Sodium 135 - 145 mmol/L 131  137  135   Potassium 3.5 - 5.1 mmol/L 3.2  3.8  3.8   Chloride 98 - 111 mmol/L 99  103  104   CO2 22 - 32 mmol/L '23  27  23   '$ Calcium 8.9 - 10.3 mg/dL 8.1  9.5  8.8   Total Protein 6.5 - 8.1 g/dL 6.9  7.0  6.9   Total Bilirubin 0.3 - 1.2 mg/dL 0.7  0.5  1.1   Alkaline Phos 38 - 126 U/L 30  38  30   AST 15 - 41 U/L '17  19  28   '$ ALT 0 - 44 U/L '13  18  25    '$ Urinalysis    Component Value Date/Time   COLORURINE YELLOW 08/08/2022 2011   APPEARANCEUR CLEAR 08/08/2022 2011   LABSPEC >=1.030 08/08/2022 2011   PHURINE 6.0 08/08/2022 2011   [place] 08/08/2022 2011   GLUCOSEU NEGATIVE 08/09/2019 [place] (A) 08/08/2022 2011   BILIRUBINUR SMALL (A) 08/08/2022 2011    BILIRUBINUR neg 05/31/2019 0820   [place] Stain  40 (A) 08/08/2022 2011   PROTEINUR >=300 (A) 08/08/2022 2011   UROBILINOGEN 0.2 08/09/2019 0739   NITRITE NEGATIVE 08/08/2022 2011   LEUKOCYTESUR NEGATIVE 08/08/2022 2011   CT renal stone study: IMPRESSION: 1. Right lower lobe airspace consolidation compatible with pneumonia. Recommend follow-up PA and lateral chest x-ray in 4-6 weeks to confirm complete resolution. 2. No acute localizing process in the abdomen or pelvis. 3. Prostatomegaly.  CTH = no acute findings  COVID neg   Assessment and Plan: * CAP (community acquired pneumonia) Fever and RLL PNA Immunosuppressed due to lenalidomide therapy.  Not neutropenic though. COVID neg Doubt bacterial meningitis: Has headache but no meningismus on exam, timing (3 day history of unchanged headache) of onset also not c/w bacterial meningitis. CAP pathway Hold lenalidomide for the moment Rocephin + azithro Giving 2nd 1g dose of rocephin now to make 2g RVP Urine for S.Pneumo AG  Kappa light chain myeloma ([place]) Hold lenalidomide given admission for PNA Dr. Chryl Heck oncology will be available for consult tomorrow.  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      Advance Care Planning:   Code Status: Full Code  Consults: None  Family Communication: No family in room  Severity of Illness: The appropriate patient status for this patient is OBSERVATION. Observation status is judged to be reasonable and necessary in order to provide the required intensity of service to ensure the patient's safety. The patient's presenting symptoms, physical exam findings, and initial radiographic and laboratory data in the context of their medical condition is felt to place them at decreased risk for further clinical deterioration. Furthermore, it is anticipated that the patient  will be medically stable for discharge from the hospital within 2 midnights of admission.   Author: Etta Quill., DO 08/09/2022 2:26 AM  For on call review [email or web address].

## 2022-08-09 NOTE — Hospital Course (Signed)
Jimmy Day is a 61 yo male with PMH 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  Symptoms have been going on for approximately 3 days prior to hospitalization.  No obvious known sick contacts. A CT renal stone protocol was ordered in the ER due to hematuria.  This showed no acute process in the abdomen or pelvis.  Kidneys and urinary tract unremarkable. This also revealed right lower lobe consolidation concerning for pneumonia.  He was started on antibiotics and admitted for further work-up.

## 2022-08-09 NOTE — Assessment & Plan Note (Addendum)
-   Presented with lethargy, cough, fever, headache - CT renal protocol shows right lower lobe pneumonia also evident on CXR.  No other acute findings appreciated on CXR - RVP negative as well - trend PCT (0.57 >>0.7>>0.52) - persistent fever despite rocephin/azithro - change to Vanc/cefepime; follow MRSA swab, if negative can d/c Vanc - pulmonology consulted due to persistent high fever - CT chest ordered due to fevers: dense and rather decent size focal consolidation; no loculations or effusions appreciated - Hold off on steroids unless develops hypoxia; currently on room air -Continue holding Revlimid while on antibiotics (oncology agrees as well) -Follow-up strep pneumo (negative) and Legionella urinary antigens (pending) - follow up sputum culture

## 2022-08-09 NOTE — Assessment & Plan Note (Addendum)
-   IgG kappa myeloma - Follows with oncology outpatient -Tolerates Revlimid; currently on hold

## 2022-08-09 NOTE — Assessment & Plan Note (Signed)
Autologous transplant.

## 2022-08-09 NOTE — Progress Notes (Signed)
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  Symptoms have been going on for approximately 3 days prior to hospitalization.  No obvious known sick contacts. A CT renal stone protocol was ordered in the ER due to hematuria.  This showed no acute process in the abdomen or pelvis.  Kidneys and urinary tract unremarkable. This also revealed right lower lobe consolidation concerning for pneumonia.  He was started on antibiotics and admitted for further work-up.  Interval History:  Resting in bed when seen, still having headache but states some relief from Tylenol.  Wife present bedside as well. He was agreeable for remaining in the hospital for further IV antibiotics through today and possible discharge home tomorrow if feeling a little better.  Assessment and Plan: * CAP (community acquired pneumonia) - Presented with lethargy, cough, fever, headache - CT renal protocol shows right lower lobe pneumonia also evident on CXR.  No other acute findings appreciated on CXR - Procalcitonin also elevated, 0.57.  Continue trending - Continue Rocephin and azithromycin - Hold off on steroids unless develops hypoxia; currently on room air -Continue holding Revlimid while on antibiotics -Follow-up strep pneumo and Legionella urinary antigens -Follow-up RVP  Kappa light chain myeloma (HCC) - IgG kappa myeloma - Follows with oncology outpatient -Tolerates Revlimid; currently on hold   Chronic kidney disease, stage 3a ([place]) - patient has history of CKD3a. 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   Old records reviewed in assessment of this  patient  Antimicrobials: Azithro 08/09/22 >> current Rocephin 08/08/22 >> current  DVT prophylaxis:  enoxaparin (LOVENOX) injection 40 mg Start: 08/09/22 1000   Code Status:   Code Status: Full Code  Mobility Assessment (last 72 hours)     Mobility Assessment     Row Name 08/09/22 1300           Does patient have an order for bedrest or is patient medically unstable No - Continue assessment       What is the highest level of mobility based on the progressive mobility assessment? Level 5 (Walks with assist in room/hall) - Balance while stepping forward/back and can walk in room with assist - Complete                Barriers to discharge: none Disposition Plan:  Home Status is: Inpt  Objective: Blood pressure 122/78, pulse 97, temperature (!) 103 F (39.4 C), temperature source Oral, resp. rate 18, height 6' (1.829 m), weight 82.9 kg, SpO2 96 %.  Examination:  Physical Exam Constitutional:      General: He is not in acute distress.    Appearance: Normal appearance.  HENT:     Head: Normocephalic and atraumatic.     Mouth/Throat:     Mouth: Mucous membranes are moist.  Eyes:     Extraocular Movements: Extraocular movements intact.  Cardiovascular:     Rate and Rhythm: Normal rate and regular rhythm.     Heart sounds: Normal heart sounds.  Pulmonary:     Effort: Pulmonary effort is normal. No respiratory distress.     Breath sounds: Examination of the right-lower  field reveals rhonchi. Rhonchi present. No wheezing or rales.  Abdominal:     General: Bowel sounds are normal. There is no distension.     Palpations: Abdomen is soft.     Tenderness: There is no abdominal tenderness.  Musculoskeletal:        General: Normal range of motion.     Cervical back: Normal range of motion and neck supple.  Skin:    General: Skin is warm and dry.  Neurological:     General: No focal deficit present.     Mental Status: He is alert.  Psychiatric:        Mood and Affect:  Mood normal.        Behavior: Behavior normal.      Consultants:    Procedures:    Data Reviewed: Results for orders placed or performed during the hospital encounter of 08/08/22 (from the past 24 hour(s))  Resp Panel by RT-PCR (Flu A&B, Covid) Anterior Nasal Swab     Status: None   Collection Time: 08/08/22  3:45 PM   Specimen: Anterior Nasal Swab  Result Value Ref Range   SARS Coronavirus 2 by RT PCR NEGATIVE NEGATIVE   Influenza A by PCR NEGATIVE NEGATIVE   Influenza B by PCR NEGATIVE NEGATIVE  CBC with Differential     Status: Abnormal   Collection Time: 08/08/22  4:46 PM  Result Value Ref Range   WBC 5.9 4.0 - 10.5 K/uL   RBC 4.40 4.22 - 5.81 MIL/uL   Hemoglobin 14.1 13.0 - 17.0 g/dL   HCT 39.8 39.0 - 52.0 %   MCV 90.5 80.0 - 100.0 fL   MCH 32.0 26.0 - 34.0 pg   MCHC 35.4 30.0 - 36.0 g/dL   RDW 13.5 11.5 - 15.5 %   Platelets 147 (L) 150 - 400 K/uL   nRBC 0.0 0.0 - 0.2 %   Neutrophils Relative % 74 %   Neutro Abs 4.4 1.7 - 7.7 K/uL   Lymphocytes Relative 8 %   Lymphs Abs 0.5 (L) 0.7 - 4.0 K/uL   Monocytes Relative 14 %   Monocytes Absolute 0.8 0.1 - 1.0 K/uL   Eosinophils Relative 2 %   Eosinophils Absolute 0.1 0.0 - 0.5 K/uL   Basophils Relative 1 %   Basophils Absolute 0.0 0.0 - 0.1 K/uL   Immature Granulocytes 1 %   Abs Immature Granulocytes 0.06 0.00 - 0.07 K/uL  Comprehensive metabolic panel     Status: Abnormal   Collection Time: 08/08/22  4:46 PM  Result Value Ref Range   Sodium 131 (L) 135 - 145 mmol/L   Potassium 3.2 (L) 3.5 - 5.1 mmol/L   Chloride 99 98 - 111 mmol/L   CO2 23 22 - 32 mmol/L   Glucose, Bld 118 (H) 70 - 99 mg/dL   BUN 16 8 - 23 mg/dL   Creatinine, Ser 1.46 (H) 0.61 - 1.24 mg/dL   Calcium 8.1 (L) 8.9 - 10.3 mg/dL   Total Protein 6.9 6.5 - 8.1 g/dL   Albumin 3.5 3.5 - 5.0 g/dL   AST 17 15 - 41 U/L   ALT 13 0 - 44 U/L   Alkaline Phosphatase 30 (L) 38 - 126 U/L   Total Bilirubin 0.7 0.3 - 1.2 mg/dL   GFR, Estimated 54 (L) >60  mL/min   Anion gap 9 5 - 15  Lipase, blood     Status: None   Collection Time: 08/08/22  4:46 PM  Result Value Ref  Range   Lipase 30 11 - 51 U/L  Lactic acid, plasma     Status: None   Collection Time: 08/08/22  4:46 PM  Result Value Ref Range   Lactic Acid, Venous 1.1 0.5 - 1.9 mmol/L  Blood culture (routine x 2)     Status: None (Preliminary result)   Collection Time: 08/08/22  4:47 PM   Specimen: BLOOD  Result Value Ref Range   Specimen Description      BLOOD RIGHT ANTECUBITAL Performed at [facility], [address] [place], [place] [address]    Special Requests      BOTTLES DRAWN AEROBIC AND ANAEROBIC Blood Culture adequate volume Performed at [facility], [address] [place], [place] [address]    Culture      NO GROWTH < 12 HOURS Performed at [facility], [place] [address], [place], [place] [address]    Report Status PENDING   Blood culture (routine x 2)     Status: None (Preliminary result)   Collection Time: 08/08/22  4:55 PM   Specimen: BLOOD RIGHT WRIST  Result Value Ref Range   Specimen Description      BLOOD RIGHT WRIST Performed at [facility] [address] [place], [place] [address]    Special Requests      BOTTLES DRAWN AEROBIC AND ANAEROBIC Blood Culture adequate volume Performed at [facility], [address] [place], [place] [address]    Culture      NO GROWTH < 12 HOURS Performed at [facility], [place] [address] [place], [place] [address]    Report Status PENDING   Urinalysis, Routine w reflex microscopic     Status: Abnormal   Collection Time: 08/08/22  8:11 PM  Result Value Ref Range   Color, Urine YELLOW YELLOW   APPearance CLEAR CLEAR   Specific Gravity, Urine >=1.030 1.005 - 1.030   pH 6.0 5.0 - 8.0   Glucose, UA NEGATIVE NEGATIVE mg/dL   Hgb urine dipstick LARGE (A) NEGATIVE   Bilirubin Urine SMALL (A) NEGATIVE   Ketones, ur 40 (A) NEGATIVE mg/dL   Protein, ur >=300 (A)  NEGATIVE mg/dL   Nitrite NEGATIVE NEGATIVE   Leukocytes,Ua NEGATIVE NEGATIVE  Urinalysis, Microscopic (reflex)     Status: Abnormal   Collection Time: 08/08/22  8:11 PM  Result Value Ref Range   RBC / HPF 11-20 0 - 5 RBC/hpf   WBC, UA 0-5 0 - 5 WBC/hpf   Bacteria, UA MANY (A) NONE SEEN   Squamous Epithelial / LPF 0-5 0 - 5   Mucus PRESENT    Hyaline Casts, UA PRESENT    Granular Casts, UA PRESENT   Procalcitonin - Baseline     Status: None   Collection Time: 08/09/22  9:55 AM  Result Value Ref Range   Procalcitonin 0.57 ng/mL  CBC with Differential/Platelet     Status: Abnormal   Collection Time: 08/09/22  9:55 AM  Result Value Ref Range   WBC 5.5 4.0 - 10.5 K/uL   RBC 4.52 4.22 - 5.81 MIL/uL   Hemoglobin 14.4 13.0 - 17.0 g/dL   HCT 42.0 39.0 - 52.0 %   MCV 92.9 80.0 - 100.0 fL   MCH 31.9 26.0 - 34.0 pg   MCHC 34.3 30.0 - 36.0 g/dL   RDW 13.8 11.5 - 15.5 %   Platelets 162 150 - 400 K/uL   nRBC 0.0 0.0 -  0.2 %   Neutrophils Relative % 78 %   Neutro Abs 4.3 1.7 - 7.7 K/uL   Lymphocytes Relative 11 %   Lymphs Abs 0.6 (L) 0.7 - 4.0 K/uL   Monocytes Relative 9 %   Monocytes Absolute 0.5 0.1 - 1.0 K/uL   Eosinophils Relative 0 %   Eosinophils Absolute 0.0 0.0 - 0.5 K/uL   Basophils Relative 1 %   Basophils Absolute 0.0 0.0 - 0.1 K/uL   WBC Morphology DOHLE BODIES    Immature Granulocytes 1 %   Abs Immature Granulocytes 0.03 0.00 - 0.07 K/uL   Reactive, Benign Lymphocytes PRESENT    Platelet Morphology LARGE PLATELETS   Magnesium     Status: None   Collection Time: 08/09/22  9:55 AM  Result Value Ref Range   Magnesium 2.0 1.7 - 2.4 mg/dL  Basic metabolic panel     Status: Abnormal   Collection Time: 08/09/22  9:55 AM  Result Value Ref Range   Sodium 133 (L) 135 - 145 mmol/L   Potassium 4.1 3.5 - 5.1 mmol/L   Chloride 101 98 - 111 mmol/L   CO2 24 22 - 32 mmol/L   Glucose, Bld 120 (H) 70 - 99 mg/dL   BUN 16 8 - 23 mg/dL   Creatinine, Ser 1.49 (H) 0.61 - 1.24 mg/dL    Calcium 8.2 (L) 8.9 - 10.3 mg/dL   GFR, Estimated 53 (L) >60 mL/min   Anion gap 8 5 - 15    I have Reviewed nursing notes, Vitals, and Lab results since pt's last encounter. Pertinent lab results : see above I have ordered test including BMP, CBC, Mg I have reviewed the last note from staff over past 24 hours I have discussed pt's care plan and test results with nursing staff, case manager   LOS: 0 days   Dwyane Dee, MD [facility] 08/09/2022, 1:50 PM

## 2022-08-09 NOTE — Assessment & Plan Note (Addendum)
-  patient has history of CKD3a. Baseline creat ~ 1.2 - 1.3, eGFR 54

## 2022-08-10 DIAGNOSIS — C9001 Multiple myeloma in remission: Secondary | ICD-10-CM

## 2022-08-10 DIAGNOSIS — J189 Pneumonia, unspecified organism: Secondary | ICD-10-CM | POA: Diagnosis not present

## 2022-08-10 DIAGNOSIS — R059 Cough, unspecified: Secondary | ICD-10-CM | POA: Diagnosis not present

## 2022-08-10 LAB — BASIC METABOLIC PANEL
Anion gap: 7 (ref 5–15)
BUN: 16 mg/dL (ref 8–23)
CO2: 24 mmol/L (ref 22–32)
Calcium: 7.9 mg/dL — ABNORMAL LOW (ref 8.9–10.3)
Chloride: 105 mmol/L (ref 98–111)
Creatinine, Ser: 1.34 mg/dL — ABNORMAL HIGH (ref 0.61–1.24)
GFR, Estimated: >60 mL/min (ref >60)
Glucose, Bld: 114 mg/dL — ABNORMAL HIGH (ref 70–99)
Potassium: 3.2 mmol/L — ABNORMAL LOW (ref 3.5–5.1)
Sodium: 136 mmol/L (ref 135–145)

## 2022-08-10 LAB — CBC WITH DIFFERENTIAL/PLATELET
Abs Immature Granulocytes: 0.01 10*3/uL (ref 0.00–0.07)
Basophils Absolute: 0 10*3/uL (ref 0.0–0.1)
Basophils Relative: 1 %
Eosinophils Absolute: 0 10*3/uL (ref 0.0–0.5)
Eosinophils Relative: 0 %
HCT: 38.7 % — ABNORMAL LOW (ref 39.0–52.0)
Hemoglobin: 13.6 g/dL (ref 13.0–17.0)
Immature Granulocytes: 0 %
Lymphocytes Relative: 10 %
Lymphs Abs: 0.5 10*3/uL — ABNORMAL LOW (ref 0.7–4.0)
MCH: 32.3 pg (ref 26.0–34.0)
MCHC: 35.1 g/dL (ref 30.0–36.0)
MCV: 91.9 fL (ref 80.0–100.0)
Monocytes Absolute: 0.5 10*3/uL (ref 0.1–1.0)
Monocytes Relative: 9 %
Neutro Abs: 4 10*3/uL (ref 1.7–7.7)
Neutrophils Relative %: 80 %
Platelets: 151 10*3/uL (ref 150–400)
RBC: 4.21 MIL/uL — ABNORMAL LOW (ref 4.22–5.81)
RDW: 13.7 % (ref 11.5–15.5)
WBC: 5 10*3/uL (ref 4.0–10.5)
nRBC: 0 % (ref 0.0–0.2)

## 2022-08-10 LAB — GASTROINTESTINAL PANEL BY PCR, STOOL (REPLACES STOOL CULTURE)

## 2022-08-10 LAB — HIV ANTIBODY (ROUTINE TESTING W REFLEX): HIV Screen 4th Generation wRfx: NONREACTIVE

## 2022-08-10 LAB — MAGNESIUM: Magnesium: 2 mg/dL (ref 1.7–2.4)

## 2022-08-10 LAB — PROCALCITONIN: Procalcitonin: 0.7 ng/mL

## 2022-08-10 MED ORDER — LOSARTAN POTASSIUM 50 MG PO TABS
50.0000 mg | ORAL_TABLET | Freq: Every day | ORAL | Status: DC
  Administered 2022-08-10 – 2022-08-11 (×2): 50 mg via ORAL
  Filled 2022-08-10 (×2): qty 1

## 2022-08-10 MED ORDER — IBUPROFEN 200 MG PO TABS
400.0000 mg | ORAL_TABLET | ORAL | Status: DC | PRN
  Administered 2022-08-10: 400 mg via ORAL
  Filled 2022-08-10: qty 2

## 2022-08-10 MED ORDER — TAMSULOSIN HCL 0.4 MG PO CAPS
0.4000 mg | ORAL_CAPSULE | Freq: Every day | ORAL | Status: DC
  Administered 2022-08-10 – 2022-08-13 (×4): 0.4 mg via ORAL
  Filled 2022-08-10 (×4): qty 1

## 2022-08-10 MED ORDER — TADALAFIL 5 MG PO TABS: 5.0000 mg | ORAL_TABLET | Freq: Every day | ORAL | Status: DC

## 2022-08-10 MED ORDER — LOPERAMIDE HCL 2 MG PO CAPS
2.0000 mg | ORAL_CAPSULE | ORAL | Status: DC | PRN
  Administered 2022-08-10 (×2): 2 mg via ORAL
  Filled 2022-08-10 (×3): qty 1

## 2022-08-10 MED ORDER — OMEGA-3-ACID ETHYL ESTERS 1 G PO CAPS
2.0000 | ORAL_CAPSULE | Freq: Two times a day (BID) | ORAL | Status: DC
  Administered 2022-08-10 – 2022-08-13 (×6): 2 g via ORAL
  Filled 2022-08-10 (×7): qty 2

## 2022-08-10 MED ORDER — BRIMONIDINE TARTRATE 0.2 % OP SOLN
1.0000 [drp] | Freq: Every day | OPHTHALMIC | Status: DC
  Administered 2022-08-10 – 2022-08-12 (×3): 1 [drp] via OPHTHALMIC
  Filled 2022-08-10: qty 5

## 2022-08-10 MED ORDER — POTASSIUM CHLORIDE CRYS ER 20 MEQ PO TBCR
40.0000 meq | EXTENDED_RELEASE_TABLET | Freq: Once | ORAL | Status: AC
  Administered 2022-08-10: 40 meq via ORAL
  Filled 2022-08-10: qty 2

## 2022-08-10 MED ORDER — BRIMONIDINE TARTRATE 0.025 % OP SOLN: 1.0000 [drp] | Freq: Every day | OPHTHALMIC | Status: DC

## 2022-08-10 MED ORDER — VITAMIN D 25 MCG (1000 UNIT) PO TABS
1000.0000 [IU] | ORAL_TABLET | Freq: Every day | ORAL | Status: DC
  Administered 2022-08-10 – 2022-08-13 (×4): 1000 [IU] via ORAL
  Filled 2022-08-10 (×4): qty 1

## 2022-08-10 NOTE — Consult Note (Signed)
Mr. Male is well-known to me.  He is a very nice 61 year old African-American male.  He has an IgG kappa myeloma.  I think he presented back in 2015.  He had extensive bony disease.  He had a wonderful response to chemotherapy.  He then underwent autologous stem cell transplant at [facility]  This was in April 2016.  We have been following him since.  He is on Revlimid as a maintenance.  He has had no problems with the Revlimid.  There is been no leukopenia.  He has been working.  I think he and his wife were up in [place] for a trip back in August.  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  He has had a had a tough time getting back.  He subsequently was admitted on 08/08/2022.  On his work-up, he had a chest x-ray which was unremarkable.  However he had a CT scan of the abdomen looking for kidney stones.  This showed a right lower lobe infiltrate.  His labs when he came in showed a white count 5.9.  Hemoglobin 14.1.  Platelet count 147,000.  The sodium is 131.  Potassium 3.2.  BUN 60 creatinine 1.46.  His total protein 6.9.  When we saw him in the office in August, he had a normal IgG level.  Has never had a problem with infections before.  Had a cough.  Cultures are all negative.  He is negative for COVID.  He has had no diarrhea.  There has been no rashes.  He has some no bleeding.  He has had no nausea or vomiting.  He has had a headache.  He had a head CT scan done which was negative.  He is on IV antibiotics right now.  He still feels a little bit rough.   His vital signs show temperature 98.9.  Pulse 88.  Blood pressure 138/78.  His oral exam does not show any mucositis.  Lungs sound relatively clear bilaterally.  There may be some occasional expiratory wheezes.  Cardiac exam regular rate and rhythm.  Has no murmurs, rubs or bruits.  Abdomen is soft.  Bowel sounds are present.  He has no fluid wave.  There is no guarding or rebound tenderness.  There is no  palpable hepatosplenomegaly.  Extremity shows no clubbing, cyanosis or edema.  Neurological exam is nonfocal.   Mr. Rehm is a 61 year old African-American male.  He has a IgG kappa myeloma.  He is in remission.  Again, when I saw him in late August, there was no monoclonal spike in his blood.  His IgG level was 1500 mg/dL.  Graph his immune system should be intact.  I do not think that his myeloma is a etiology for the pneumonia.  He is not leukopenic from the Revlimid.  I would just hold the Revlimid while he is in the hospital.  We will see what his cultures had show.  I will continue his IV antibiotic for right now.  I know he is getting great care from everybody upon 4 W.  I do appreciate everybody's help.  Lattie Haw, MD  1 Cor; 15:58

## 2022-08-10 NOTE — Progress Notes (Signed)
Progress Note    Jimmy Day   [id number]  DOB: 13-Jul-1961  DOA: 08/08/2022     1 PCP: [name], NP  Initial CC: lethargy, fever, HA, cough  Hospital Course: Jimmy Day is a 61 yo male with PMH 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  Symptoms have been going on for approximately 3 days prior to hospitalization.  No obvious known sick contacts. A CT renal stone protocol was ordered in the ER due to hematuria.  This showed no acute process in the abdomen or pelvis.  Kidneys and urinary tract unremarkable. This also revealed right lower lobe consolidation concerning for pneumonia.  He was started on antibiotics and admitted for further work-up.  Interval History:  Still had fevers overnight up to 103.2.  Appears fatigued this morning but continues to ambulate around room some.  Wife present bedside this morning with update given as well.  Assessment and Plan: * CAP (community acquired pneumonia) - Presented with lethargy, cough, fever, headache - CT renal protocol shows right lower lobe pneumonia also evident on CXR.  No other acute findings appreciated on CXR - RVP negative as well - trend PCT (0.57 >>0.7) - Continue Rocephin and azithromycin - Hold off on steroids unless develops hypoxia; currently on room air -Continue holding Revlimid while on antibiotics (oncology agrees as well) -Follow-up strep pneumo (negative) and Legionella urinary antigens (pending) - given fevers overnight and PCT up some, will keep in hospital 1 more night and trend fever curve further   Kappa light chain myeloma (HCC) - IgG kappa myeloma - Follows with oncology outpatient (seen by Dr. Marin Olp while hospitalized as well) -Tolerates Revlimid; currently on hold   Chronic kidney disease, stage 3a ([place]) - patient has history of CKD3a. Baseline creat ~ 1.2 - 1.3, eGFR 54  Status post bone marrow  transplant ([place]) Autologous transplant.   Old records reviewed in assessment of this patient  Antimicrobials: Azithro 08/09/22 >> current Rocephin 08/08/22 >> current  DVT prophylaxis:  enoxaparin (LOVENOX) injection 40 mg Start: 08/09/22 1000   Code Status:   Code Status: Full Code  Mobility Assessment (last 72 hours)     Mobility Assessment     Row Name 08/09/22 2100 08/09/22 1300         Does patient have an order for bedrest or is patient medically unstable No - Continue assessment No - Continue assessment      What is the highest level of mobility based on the progressive mobility assessment? Level 6 (Walks independently in room and hall) - Balance while walking in room without assist - Complete Level 5 (Walks with assist in room/hall) - Balance while stepping forward/back and can walk in room with assist - Complete               Barriers to discharge: none Disposition Plan:  Home Status is: Inpt  Objective: Blood pressure 135/85, pulse 88, temperature (!) 101.3 F (38.5 C), temperature source Oral, resp. rate 18, height 6' (1.829 m), weight 82.9 kg, SpO2 96 %.  Examination:  Physical Exam Constitutional:      General: He is not in acute distress.    Appearance: Normal appearance.  HENT:     Head: Normocephalic and atraumatic.     Mouth/Throat:     Mouth: Mucous membranes are moist.  Eyes:     Extraocular Movements: Extraocular movements intact.  Cardiovascular:  Rate and Rhythm: Normal rate and regular rhythm.     Heart sounds: Normal heart sounds.  Pulmonary:     Effort: Pulmonary effort is normal. No respiratory distress.     Breath sounds: Examination of the right-lower field reveals rhonchi. Rhonchi present. No wheezing or rales.  Abdominal:     General: Bowel sounds are normal. There is no distension.     Palpations: Abdomen is soft.     Tenderness: There is no abdominal tenderness.  Musculoskeletal:        General: Normal range of motion.      Cervical back: Normal range of motion and neck supple.  Skin:    General: Skin is warm and dry.  Neurological:     General: No focal deficit present.     Mental Status: He is alert.  Psychiatric:        Mood and Affect: Mood normal.        Behavior: Behavior normal.      Consultants:    Procedures:    Data Reviewed: Results for orders placed or performed during the hospital encounter of 08/08/22 (from the past 24 hour(s))  HIV Antibody (routine testing w rflx)     Status: None   Collection Time: 08/10/22  3:37 AM  Result Value Ref Range   HIV Screen 4th Generation wRfx Non Reactive Non Reactive  Procalcitonin     Status: None   Collection Time: 08/10/22  3:37 AM  Result Value Ref Range   Procalcitonin 0.70 ng/mL  Basic metabolic panel     Status: Abnormal   Collection Time: 08/10/22  3:37 AM  Result Value Ref Range   Sodium 136 135 - 145 mmol/L   Potassium 3.2 (L) 3.5 - 5.1 mmol/L   Chloride 105 98 - 111 mmol/L   CO2 24 22 - 32 mmol/L   Glucose, Bld 114 (H) 70 - 99 mg/dL   BUN 16 8 - 23 mg/dL   Creatinine, Ser 1.34 (H) 0.61 - 1.24 mg/dL   Calcium 7.9 (L) 8.9 - 10.3 mg/dL   GFR, Estimated >60 >60 mL/min   Anion gap 7 5 - 15  CBC with Differential/Platelet     Status: Abnormal   Collection Time: 08/10/22  3:37 AM  Result Value Ref Range   WBC 5.0 4.0 - 10.5 K/uL   RBC 4.21 (L) 4.22 - 5.81 MIL/uL   Hemoglobin 13.6 13.0 - 17.0 g/dL   HCT 38.7 (L) 39.0 - 52.0 %   MCV 91.9 80.0 - 100.0 fL   MCH 32.3 26.0 - 34.0 pg   MCHC 35.1 30.0 - 36.0 g/dL   RDW 13.7 11.5 - 15.5 %   Platelets 151 150 - 400 K/uL   nRBC 0.0 0.0 - 0.2 %   Neutrophils Relative % 80 %   Neutro Abs 4.0 1.7 - 7.7 K/uL   Lymphocytes Relative 10 %   Lymphs Abs 0.5 (L) 0.7 - 4.0 K/uL   Monocytes Relative 9 %   Monocytes Absolute 0.5 0.1 - 1.0 K/uL   Eosinophils Relative 0 %   Eosinophils Absolute 0.0 0.0 - 0.5 K/uL   Basophils Relative 1 %   Basophils Absolute 0.0 0.0 - 0.1 K/uL   WBC Morphology  MILD LEFT SHIFT (1-5% METAS, OCC MYELO, OCC BANDS)    Immature Granulocytes 0 %   Abs Immature Granulocytes 0.01 0.00 - 0.07 K/uL   Dohle Bodies PRESENT   Magnesium     Status: None   Collection Time:  08/10/22  3:37 AM  Result Value Ref Range   Magnesium 2.0 1.7 - 2.4 mg/dL    I have Reviewed nursing notes, Vitals, and Lab results since pt's last encounter. Pertinent lab results : see above I have ordered test including BMP, CBC, Mg I have reviewed the last note from staff over past 24 hours I have discussed pt's care plan and test results with nursing staff, case manager   LOS: 1 day   Dwyane Dee, MD [facility] 08/10/2022, 2:00 PM

## 2022-08-11 ENCOUNTER — Inpatient Hospital Stay (HOSPITAL_COMMUNITY): Payer: 59

## 2022-08-11 DIAGNOSIS — J189 Pneumonia, unspecified organism: Secondary | ICD-10-CM | POA: Diagnosis not present

## 2022-08-11 DIAGNOSIS — Z8579 Personal history of other malignant neoplasms of lymphoid, hematopoietic and related tissues: Secondary | ICD-10-CM

## 2022-08-11 DIAGNOSIS — I959 Hypotension, unspecified: Secondary | ICD-10-CM | POA: Diagnosis not present

## 2022-08-11 LAB — CBC WITH DIFFERENTIAL/PLATELET
Abs Immature Granulocytes: 0.02 10*3/uL (ref 0.00–0.07)
Basophils Absolute: 0.1 10*3/uL (ref 0.0–0.1)
Basophils Relative: 1 %
Eosinophils Absolute: 0.1 10*3/uL (ref 0.0–0.5)
Eosinophils Relative: 1 %
HCT: 38.5 % — ABNORMAL LOW (ref 39.0–52.0)
Hemoglobin: 13.4 g/dL (ref 13.0–17.0)
Immature Granulocytes: 0 %
Lymphocytes Relative: 9 %
Lymphs Abs: 0.5 10*3/uL — ABNORMAL LOW (ref 0.7–4.0)
MCH: 31.8 pg (ref 26.0–34.0)
MCHC: 34.8 g/dL (ref 30.0–36.0)
MCV: 91.4 fL (ref 80.0–100.0)
Monocytes Absolute: 0.4 10*3/uL (ref 0.1–1.0)
Monocytes Relative: 7 %
Neutro Abs: 4.7 10*3/uL (ref 1.7–7.7)
Neutrophils Relative %: 82 %
Platelets: 146 10*3/uL — ABNORMAL LOW (ref 150–400)
RBC: 4.21 MIL/uL — ABNORMAL LOW (ref 4.22–5.81)
RDW: 13.8 % (ref 11.5–15.5)
WBC: 5.7 10*3/uL (ref 4.0–10.5)
nRBC: 0 % (ref 0.0–0.2)

## 2022-08-11 LAB — BASIC METABOLIC PANEL
Anion gap: 8 (ref 5–15)
BUN: 14 mg/dL (ref 8–23)
CO2: 27 mmol/L (ref 22–32)
Calcium: 7.6 mg/dL — ABNORMAL LOW (ref 8.9–10.3)
Chloride: 101 mmol/L (ref 98–111)
Creatinine, Ser: 1.21 mg/dL (ref 0.61–1.24)
GFR, Estimated: >60 mL/min (ref >60)
Glucose, Bld: 112 mg/dL — ABNORMAL HIGH (ref 70–99)
Potassium: 3.8 mmol/L (ref 3.5–5.1)
Sodium: 136 mmol/L (ref 135–145)

## 2022-08-11 LAB — EXPECTORATED SPUTUM ASSESSMENT W GRAM STAIN, RFLX TO RESP C

## 2022-08-11 LAB — PROCALCITONIN: Procalcitonin: 0.52 ng/mL

## 2022-08-11 LAB — MAGNESIUM: Magnesium: 2.2 mg/dL (ref 1.7–2.4)

## 2022-08-11 LAB — MRSA NEXT GEN BY PCR, NASAL: MRSA by PCR Next Gen: NOT DETECTED

## 2022-08-11 MED ORDER — VANCOMYCIN HCL 1750 MG/350ML IV SOLN
1750.0000 mg | INTRAVENOUS | Status: DC
  Filled 2022-08-11: qty 350

## 2022-08-11 MED ORDER — SODIUM CHLORIDE 0.9 % IV SOLN
2.0000 g | Freq: Three times a day (TID) | INTRAVENOUS | Status: DC
  Administered 2022-08-11 – 2022-08-13 (×6): 2 g via INTRAVENOUS
  Filled 2022-08-11 (×6): qty 12.5

## 2022-08-11 MED ORDER — SODIUM CHLORIDE 0.9 % IV BOLUS
1000.0000 mL | Freq: Once | INTRAVENOUS | Status: AC
  Administered 2022-08-11: 1000 mL via INTRAVENOUS

## 2022-08-11 MED ORDER — VANCOMYCIN HCL 2000 MG/400ML IV SOLN
2000.0000 mg | Freq: Once | INTRAVENOUS | Status: AC
  Administered 2022-08-11: 2000 mg via INTRAVENOUS
  Filled 2022-08-11: qty 400

## 2022-08-11 MED ORDER — ACETAMINOPHEN 500 MG PO TABS
1000.0000 mg | ORAL_TABLET | Freq: Four times a day (QID) | ORAL | Status: AC
  Administered 2022-08-11 – 2022-08-12 (×3): 1000 mg via ORAL
  Filled 2022-08-11 (×3): qty 2

## 2022-08-11 NOTE — Progress Notes (Signed)
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  Symptoms have been going on for approximately 3 days prior to hospitalization.  No obvious known sick contacts. A CT renal stone protocol was ordered in the ER due to hematuria.  This showed no acute process in the abdomen or pelvis.  Kidneys and urinary tract unremarkable. This also revealed right lower lobe consolidation concerning for pneumonia.  He was started on antibiotics and admitted for further work-up.  Interval History:  Subjectively, he seems to be feeling a little better however he is also anxious for going home.  He continues to have high fevers over the past 24 hours still up to 103. This afternoon, with ambulating he also became hypotensive and dizzy. Antibiotics broadened today and pulmonology consulted.  CT chest also obtained. Wife present bedside this morning and also updated.  Assessment and Plan: * CAP (community acquired pneumonia) - Presented with lethargy, cough, fever, headache - CT renal protocol shows right lower lobe pneumonia also evident on CXR.  No other acute findings appreciated on CXR - RVP negative as well - trend PCT (0.57 >>0.7>>0.52) - persistent fever despite rocephin/azithro - change to Vanc/cefepime; follow MRSA swab, if negative can d/c Vanc - pulmonology consulted due to persistent high fever - CT chest ordered due to fevers: dense and rather decent size focal consolidation; no loculations or effusions appreciated - Hold off on steroids unless develops hypoxia; currently on room air -Continue holding Revlimid while on antibiotics (oncology agrees as well) -Follow-up strep pneumo (negative) and  Legionella urinary antigens (pending) - follow up sputum culture  Hypotension - Patient developed hypotension and dizziness this afternoon when ambulating - Hold losartan for now - Normal saline bolus ordered - Follow-up vitals response.  Resume losartan as able  Kappa light chain myeloma (HCC) - IgG kappa myeloma - Follows with oncology outpatient (seen by Dr. Marin Day while hospitalized as well) -Tolerates Revlimid; currently on hold   Chronic kidney disease, stage 3a ([place]) - patient has history of CKD3a. 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   Old records reviewed in assessment of this patient  Antimicrobials: Azithro 08/09/22 >> 08/11/2022 Rocephin 08/08/22 >> 08/11/2022 Vancomycin 08/11/2022 >> current Cefepime 08/11/2022 >> current  DVT prophylaxis:  enoxaparin (LOVENOX) injection 40 mg Start: 08/09/22 1000   Code Status:   Code Status: Full Code  Mobility Assessment (last 72 hours)     Mobility Assessment     Row Name 08/10/22 2025 08/10/22 0924 08/09/22 2100 08/09/22 1300     Does patient have an order for bedrest or is patient medically unstable No - Continue assessment No - Continue assessment No - Continue assessment No - Continue assessment    What is the highest level of mobility based on the progressive mobility assessment? Level 6 (Walks independently in room and hall) - Balance while walking in room without assist - Complete Level 6 (Walks independently in room and hall) - Balance while walking in room without assist - Complete Level 6 (Walks independently in room and hall) - Balance while walking in room without assist - Complete Level 5 (Walks with  assist in room/hall) - Balance while stepping forward/back and can walk in room with assist - Complete             Barriers to discharge: none Disposition Plan:  Home Status is: Inpt  Objective: Blood pressure 99/61, pulse 93, temperature 99 F  (37.2 C), temperature source Oral, resp. rate (!) 22, height 6' (1.829 m), weight 82.9 kg, SpO2 99 %.  Examination:  Physical Exam Constitutional:      General: He is not in acute distress.    Appearance: Normal appearance.  HENT:     Head: Normocephalic and atraumatic.     Mouth/Throat:     Mouth: Mucous membranes are moist.  Eyes:     Extraocular Movements: Extraocular movements intact.  Cardiovascular:     Rate and Rhythm: Normal rate and regular rhythm.     Heart sounds: Normal heart sounds.  Pulmonary:     Effort: Pulmonary effort is normal. No respiratory distress.     Breath sounds: Examination of the right-lower field reveals rhonchi. Rhonchi present. No wheezing or rales.  Abdominal:     General: Bowel sounds are normal. There is no distension.     Palpations: Abdomen is soft.     Tenderness: There is no abdominal tenderness.  Musculoskeletal:        General: Normal range of motion.     Cervical back: Normal range of motion and neck supple.  Skin:    General: Skin is warm and dry.  Neurological:     General: No focal deficit present.     Mental Status: He is alert.  Psychiatric:        Mood and Affect: Mood normal.        Behavior: Behavior normal.      Consultants:  Pulmonology  Procedures:    Data Reviewed: Results for orders placed or performed during the hospital encounter of 08/08/22 (from the past 24 hour(s))  Procalcitonin     Status: None   Collection Time: 08/11/22  3:36 AM  Result Value Ref Range   Procalcitonin 0.52 ng/mL  Basic metabolic panel     Status: Abnormal   Collection Time: 08/11/22  3:36 AM  Result Value Ref Range   Sodium 136 135 - 145 mmol/L   Potassium 3.8 3.5 - 5.1 mmol/L   Chloride 101 98 - 111 mmol/L   CO2 27 22 - 32 mmol/L   Glucose, Bld 112 (H) 70 - 99 mg/dL   BUN 14 8 - 23 mg/dL   Creatinine, Ser 1.21 0.61 - 1.24 mg/dL   Calcium 7.6 (L) 8.9 - 10.3 mg/dL   GFR, Estimated >60 >60 mL/min   Anion gap 8 5 - 15  CBC with  Differential/Platelet     Status: Abnormal   Collection Time: 08/11/22  3:36 AM  Result Value Ref Range   WBC 5.7 4.0 - 10.5 K/uL   RBC 4.21 (L) 4.22 - 5.81 MIL/uL   Hemoglobin 13.4 13.0 - 17.0 g/dL   HCT 38.5 (L) 39.0 - 52.0 %   MCV 91.4 80.0 - 100.0 fL   MCH 31.8 26.0 - 34.0 pg   MCHC 34.8 30.0 - 36.0 g/dL   RDW 13.8 11.5 - 15.5 %   Platelets 146 (L) 150 - 400 K/uL   nRBC 0.0 0.0 - 0.2 %   Neutrophils Relative % 82 %   Neutro Abs 4.7 1.7 - 7.7 K/uL   Lymphocytes Relative 9 %   Lymphs Abs 0.5 (L) 0.7 -  4.0 K/uL   Monocytes Relative 7 %   Monocytes Absolute 0.4 0.1 - 1.0 K/uL   Eosinophils Relative 1 %   Eosinophils Absolute 0.1 0.0 - 0.5 K/uL   Basophils Relative 1 %   Basophils Absolute 0.1 0.0 - 0.1 K/uL   Immature Granulocytes 0 %   Abs Immature Granulocytes 0.02 0.00 - 0.07 K/uL   Reactive, Benign Lymphocytes PRESENT    Dohle Bodies PRESENT   Magnesium     Status: None   Collection Time: 08/11/22  3:36 AM  Result Value Ref Range   Magnesium 2.2 1.7 - 2.4 mg/dL  Expectorated Sputum Assessment w Gram Stain, Rflx to Resp Cult     Status: None   Collection Time: 08/11/22  1:10 PM   Specimen: Sputum  Result Value Ref Range   Specimen Description SPUTUM    Special Requests NONE    Sputum evaluation      THIS SPECIMEN IS ACCEPTABLE FOR SPUTUM CULTURE Performed at [facility], [place] [address], [place], [place] [address]    Report Status 08/11/2022 FINAL     I have Reviewed nursing notes, Vitals, and Lab results since pt's last encounter. Pertinent lab results : see above I have ordered test including BMP, CBC, Mg I have reviewed the last note from staff over past 24 hours I have discussed pt's care plan and test results with nursing staff, case manager   LOS: 2 days   Dwyane Dee, MD [facility] 08/11/2022, 2:06 PM

## 2022-08-11 NOTE — Progress Notes (Signed)
Jimmy Day had a temperature this morning 103.1.  He says he feels okay.  He says he still coughing up a little bit of purulent mucus.  So far, cultures were all negative.  He is on antibiotics with azithromycin and Rocephin.  Again, there is no evidence of active myeloma.  His immunoglobulin levels are okay with respect to the IgG.  His IgA is on the lower side.  He has had no problems with nausea or vomiting.  Had little bit of diarrhea.  This is resolved.  His labs show white count 5.7.  Hemoglobin 13.4.  Platelet count 146,000.  His sodium is 136.  Potassium 3.8.  BUN 14 creatinine 1.21.  Calcium is 7.6.  He has had no bleeding.  There is been no rashes.  He has had no tingling in the hands or feet.  His vital signs are temperature of one 3.1.  Pulse 101.  Blood pressure 144/88.  His head and neck exam shows no scleral icterus.  There is no mucositis in the oral cavity.  His lungs do sound with rales in the right lung base.  Left lung field is clear.  Cardiac exam slightly tachycardic but regular.  He has no murmurs, rubs or bruits.  Abdomen is soft.  Bowel sounds are present.  There is no guarding or rebound tenderness.  He has no fluid wave.  There is no palpable liver or spleen tip.  Extremities shows no clubbing, cyanosis or edema.  Neurological exam is nonfocal.  Mr. Leaf has pneumonia.  This is community-acquired.  Again I just do not think that he is immunocompromised.  He has had no issues with myeloma for many years.  His symptoms of transplant was seen back in 2017.  I am worried about the temperature though.  I do not  know if this might indicate some kind of viral syndrome that he might have.  I think he is on pretty good antibiotic coverage right now.  I do think he probably needs to use a incentive spirometer.  I know that he is getting wonderful care from everybody up [address]  Lattie Haw, MD  Michaelyn Barter 4:2

## 2022-08-11 NOTE — Progress Notes (Signed)
   08/11/22 0501  Assess: MEWS Score  Temp (!) 103.1 F (39.5 C)  BP (!) 144/88  MAP (mmHg) 103  Pulse Rate (!) 101  ECG Heart Rate 99  Resp 18  SpO2 95 %  O2 Device Room Air  Assess: MEWS Score  MEWS Temp 2  MEWS Systolic 0  MEWS Pulse 0  MEWS RR 0  MEWS LOC 0  MEWS Score 2  MEWS Score Color Yellow  Assess: if the MEWS score is Yellow or Red  Were vital signs taken at a resting state? Yes  Focused Assessment No change from prior assessment  Does the patient meet 2 or more of the SIRS criteria? Yes  Does the patient have a confirmed or suspected source of infection? Yes  MEWS guidelines implemented *See Row Information* Yes  Treat  MEWS Interventions Administered prn meds/treatments  Pain Scale 0-10  Take Vital Signs  Increase Vital Sign Frequency  Yellow: Q 2hr X 2 then Q 4hr X 2, if remains yellow, continue Q 4hrs  Escalate  MEWS: Escalate Yellow: discuss with charge nurse/RN and consider discussing with provider and RRT  Notify: Charge Nurse/RN  Name of Charge Nurse/RN Notified Pam, RN  Date Charge Nurse/RN Notified 08/11/22  Time Charge Nurse/RN Notified [phone]  Notify: Provider  Provider Name/Title Lennox Grumbles, NP  Date Provider Notified 08/11/22  Time Provider Notified [phone]  Method of Notification Page  Notification Reason Other (Comment) (MEWS yellow/temp 103)  Provider response See new orders (scheduled tylenol)  Date of Provider Response 08/11/22  Time of Provider Response 0527  Assess: SIRS CRITERIA  SIRS Temperature  1  SIRS Pulse 1  SIRS Respirations  0  SIRS WBC 1  SIRS Score Sum  3

## 2022-08-11 NOTE — Consult Note (Signed)
NAME:  Jimmy Day, MRN:  [id number], DOB:  19-Aug-1961, LOS: 2 ADMISSION DATE:  08/08/2022, CONSULTATION DATE:  08/11/22 REFERRING MD:  Sabino Gasser, CHIEF COMPLAINT:  fever, HA, cough   History of Present Illness:  61yM with history of MM s/p autologous BMT 2016 on revlimid, HTN, who presented with acute fever, cough, HA, nasal congestion. CT renal stone protocol revealed dense RLL consolidation, admitted and started on CAP coverage 9/16 yet has remained febrile to 39 throughout admission. Noninvasive infectious workup to date is unrevealing. On 9/19 PCCM consulted for nonresolving pneumonia, broadened to vanc/cefepime and plan to attempt to obtain sputum culture.    Pertinent  Medical History  Kappa light chain MM s/p autologous BMT HTN CKD3  Significant Hospital Events: Including procedures, antibiotic start and stop dates in addition to other pertinent events   9/16 admitted, started on CTX/azithromycin for presumed CAP  Interim History / Subjective:  He says he's actually now feeling much better today. Starting to get up and walk around. Mentions a tremor that just started late this morning.  Objective   Blood pressure (!) 144/88, pulse (!) 101, temperature 99.3 F (37.4 C), temperature source Oral, resp. rate 18, height 6' (1.829 m), weight 82.9 kg, SpO2 95 %.        Intake/Output Summary (Last 24 hours) at 08/11/2022 1017 Last data filed at 08/10/2022 2339 Gross per 24 hour  Intake 350 ml  Output --  Net 350 ml   Filed Weights   08/08/22 1539 08/09/22 0158  Weight: 87.1 kg 82.9 kg    Examination: General appearance: 61 y.o., male, NAD, conversant  Eyes: PERRL, tracking appropriately HENT: NCAT; MMM Neck: Trachea midline; no lymphadenopathy, no JVD, not meningitic Lungs: CTAB, no crackles, no wheeze, with normal respiratory effort CV: RRR, no murmur  Abdomen: Soft, non-tender; non-distended, BS present  Extremities: No peripheral edema, warm Skin: Normal turgor and  texture; no rash Psych: Appropriate affect Neuro: Alert and oriented to person and place, no focal deficit - a little tremulous   CT Chest today with dense RLL consolidation   Resolved Hospital Problem list    Assessment & Plan:   # Non-resolving pneumonia Seems like this is probably slowly resolving CAP in setting minor/relative immunosuppression.  - f/u urine legionella, sputum cx, MRSA nare  - continue vanc/cefepime for now - If tremor failing to improve or worsening would switch to zosyn and stop cefepime. Could stop vanc if MRSA nare neg - not sure what to make of low IgA/IgM but could ask Oncology/Dr. Marin Olp if he could potentially benefit from IVIG - we talked about yield of bronchoscopy for fever/pneumonia and immunosuppression - 30% yield wrt changing management. Low risk of serious risks. Given his subjective clinical improvement to date he is reluctant to undergo bronch/BAL at this time.  We will follow  Best Practice (right click and "Reselect all SmartList Selections" daily)   Per [facility]  Labs   CBC: Recent Labs  Lab 08/08/22 1646 08/09/22 0955 08/10/22 0337 08/11/22 0336  WBC 5.9 5.5 5.0 5.7  NEUTROABS 4.4 4.3 4.0 4.7  HGB 14.1 14.4 13.6 13.4  HCT 39.8 42.0 38.7* 38.5*  MCV 90.5 92.9 91.9 91.4  PLT 147* 162 151 146*    Basic Metabolic Panel: Recent Labs  Lab 08/08/22 1646 08/09/22 0955 08/10/22 0337 08/11/22 0336  NA 131* 133* 136 136  K 3.2* 4.1 3.2* 3.8  CL 99 101 105 101  CO2 '23 24 24 27  '$ GLUCOSE 118* 120*  114* 112*  BUN '16 16 16 14  '$ CREATININE 1.46* 1.49* 1.34* 1.21  CALCIUM 8.1* 8.2* 7.9* 7.6*  MG  --  2.0 2.0 2.2   GFR: Estimated Creatinine Clearance: 70.4 mL/min (by C-G formula based on SCr of 1.21 mg/dL). Recent Labs  Lab 08/08/22 1646 08/09/22 0955 08/10/22 0337 08/11/22 0336  PROCALCITON  --  0.57 0.70 0.52  WBC 5.9 5.5 5.0 5.7  LATICACIDVEN 1.1  --   --   --     Liver Function Tests: Recent Labs  Lab 08/08/22 1646  AST  17  ALT 13  ALKPHOS 30*  BILITOT 0.7  PROT 6.9  ALBUMIN 3.5   Recent Labs  Lab 08/08/22 1646  LIPASE 30   No results for input(s): "AMMONIA" in the last 168 hours.  ABG No results found for: "PHART", "PCO2ART", "PO2ART", "HCO3", "TCO2", "ACIDBASEDEF", "O2SAT"   Coagulation Profile: No results for input(s): "INR", "PROTIME" in the last 168 hours.  Cardiac Enzymes: No results for input(s): "CKTOTAL", "CKMB", "CKMBINDEX", "TROPONINI" in the last 168 hours.  HbA1C: Hgb A1c MFr Bld  Date/Time Value Ref Range Status  06/26/2022 10:28 AM 5.6 4.6 - 6.5 % Final    Comment:    Glycemic Control Guidelines for People with Diabetes:Non Diabetic:  <6%Goal of Therapy: <7%Additional Action Suggested:  >8%   04/08/2009 12:00 AM 5.6 4.6 - 6.5 % Final    Comment:    See lab report for associated comment(s)    CBG: No results for input(s): "GLUCAP" in the last 168 hours.  Review of Systems:   12 point review of systems is negative except as in HPI  Past Medical History:  He,  has a past medical history of Arthritis, Elevated PSA (11/29/2017), Fasting hyperglycemia, History of COVID-19 (2020), Hyperlipidemia, Hypertension, Kappa light chain myeloma ([place]) (02/02/2014), Pneumonia, and Testosterone deficiency.   Surgical History:   Past Surgical History:  Procedure Laterality Date   ANTERIOR CERVICAL DECOMP/DISCECTOMY FUSION N/A 04/30/2021   Procedure: Anterior Cervical Decompression Fusion Cervical three-four, Cervical four-five, Cervical six-seven;  Surgeon: Vallarie Mare, MD;  Location: [facility];  Service: Neurosurgery;  Laterality: N/A;   CERVICAL PLATE INSERTION     POST COMPRESSION DISC INJURY   LIPOMAS REMOVAL     BENIGN FROM CHEST AND BACK     Social History:   reports that he has never smoked. He has never used smokeless tobacco. He reports that he does not currently use alcohol. He reports that he does not use drugs.   Family History:  His family history includes Breast  cancer in his mother; Heart attack in his paternal grandmother; Heart attack (age of onset: 11) in his father; Heart attack (age of onset: 7) in his maternal aunt; Hypertension in his brother; Kidney disease in his brother; Stroke in his maternal uncle. There is no history of Diabetes, Colon cancer, Esophageal cancer, Rectal cancer, Stomach cancer, or Colon polyps.   Allergies No Known Allergies   Home Medications  Prior to Admission medications   Medication Sig Start Date End Date Taking? Authorizing Provider  Brimonidine Tartrate (LUMIFY) 0.025 % SOLN Place 1 drop into both eyes daily.   Yes [provider]  cholecalciferol (VITAMIN D3) 25 MCG (1000 UT) tablet Take 1 tablet (1,000 Units total) by mouth daily. 11/28/18  Yes [name], NP  lenalidomide (REVLIMID) 10 MG capsule TAKE 1 CAPSULE DAILY FOR 21 DAYS, THEN 7 DAYS OFF Auth# [id number] Patient taking differently: Take 10 mg by mouth as directed.  TAKE 1 CAPSULE DAILY FOR 21 DAYS, THEN 7 DAYS OFF Auth# [id number] 07/23/22  Yes Ennever, Rudell Cobb, MD  losartan (COZAAR) 50 MG tablet Take 1 tablet by mouth daily 02/18/22  Yes   omega-3 acid ethyl esters (LOVAZA) 1 g capsule Take 2 capsules (2 g total) by mouth 2 (two) times daily. 06/26/22  Yes [name], NP  tadalafil (CIALIS) 5 MG tablet Take 1 tablet by mouth everyday 05/18/22  Yes   tamsulosin (FLOMAX) 0.4 MG CAPS capsule Take 1 CAPSULE by mouth ONCE DAILY Patient taking differently: Take 0.4 mg by mouth daily. 02/25/21  Yes      Critical care time: n/a

## 2022-08-11 NOTE — Progress Notes (Signed)
Pharmacy Antibiotic Note  Jimmy Day is a 61 y.o. male admitted on 08/08/2022 with fever, headache, cough and nasal congestion for approximately 3 days PTA. He was started on ceftriaxone/azithromycin for CAP. Patient with ongoing fevers. Antibiotics broadened 9/19 to cefepime and vancomycin. Pharmacy has been consulted for vancomycin dosing.  Plan: -Vancomycin 2 g followed by 1750 mg IV q24h -Continue cefepime per MD -Pharmacy to continue to follow renal function, cultures and clinical progress for antibiotic dose adjustments and de-escalation as indicated  Height: 6' (182.9 cm) Weight: 82.9 kg (182 lb 12.2 oz) IBW/kg (Calculated) : 77.6  Temp (24hrs), Avg:100.8 F (38.2 C), Min:99.3 F (37.4 C), Max:103.1 F (39.5 C)  Recent Labs  Lab 08/08/22 1646 08/09/22 0955 08/10/22 0337 08/11/22 0336  WBC 5.9 5.5 5.0 5.7  CREATININE 1.46* 1.49* 1.34* 1.21  LATICACIDVEN 1.1  --   --   --     Estimated Creatinine Clearance: 70.4 mL/min (by C-G formula based on SCr of 1.21 mg/dL).    No Known Allergies  Antimicrobials this admission: Cefepime 9/19 >> Vancomycin 9/19 >> Ceftriaxone 9/16 >> 9/19 Azithromycin 9/16 >> 9/19  Dose adjustments this admission: NA  Microbiology results: 9/16 BCx: ngtd 9/17 Resp panel: negative 9/17 GI panel: negative 9/19 MRSA PCR: ordered  Thank you for allowing pharmacy to be a part of this patient's care.  Tawnya Crook, PharmD, BCPS Clinical Pharmacist 08/11/2022 12:30 PM

## 2022-08-11 NOTE — Assessment & Plan Note (Signed)
-   Patient developed hypotension and dizziness this afternoon when ambulating - Hold losartan for now - Normal saline bolus ordered - Follow-up vitals response.  Resume losartan as able

## 2022-08-12 ENCOUNTER — Other Ambulatory Visit: Payer: Self-pay

## 2022-08-12 ENCOUNTER — Telehealth: Payer: Self-pay | Admitting: Student

## 2022-08-12 DIAGNOSIS — J189 Pneumonia, unspecified organism: Secondary | ICD-10-CM | POA: Diagnosis not present

## 2022-08-12 DIAGNOSIS — R059 Cough, unspecified: Secondary | ICD-10-CM | POA: Diagnosis not present

## 2022-08-12 DIAGNOSIS — Z9481 Bone marrow transplant status: Secondary | ICD-10-CM | POA: Diagnosis not present

## 2022-08-12 DIAGNOSIS — I959 Hypotension, unspecified: Secondary | ICD-10-CM | POA: Diagnosis not present

## 2022-08-12 DIAGNOSIS — C9000 Multiple myeloma not having achieved remission: Secondary | ICD-10-CM

## 2022-08-12 LAB — CBC WITH DIFFERENTIAL/PLATELET
Abs Immature Granulocytes: 0.03 10*3/uL (ref 0.00–0.07)
Basophils Absolute: 0.1 10*3/uL (ref 0.0–0.1)
Basophils Relative: 1 %
Eosinophils Absolute: 0.1 10*3/uL (ref 0.0–0.5)
Eosinophils Relative: 2 %
HCT: 34.4 % — ABNORMAL LOW (ref 39.0–52.0)
Hemoglobin: 12 g/dL — ABNORMAL LOW (ref 13.0–17.0)
Immature Granulocytes: 1 %
Lymphocytes Relative: 13 %
Lymphs Abs: 0.6 10*3/uL — ABNORMAL LOW (ref 0.7–4.0)
MCH: 31.3 pg (ref 26.0–34.0)
MCHC: 34.9 g/dL (ref 30.0–36.0)
MCV: 89.8 fL (ref 80.0–100.0)
Monocytes Absolute: 0.4 10*3/uL (ref 0.1–1.0)
Monocytes Relative: 10 %
Neutro Abs: 3.3 10*3/uL (ref 1.7–7.7)
Neutrophils Relative %: 73 %
Platelets: 163 10*3/uL (ref 150–400)
RBC: 3.83 MIL/uL — ABNORMAL LOW (ref 4.22–5.81)
RDW: 13.8 % (ref 11.5–15.5)
WBC: 4.5 10*3/uL (ref 4.0–10.5)
nRBC: 0 % (ref 0.0–0.2)

## 2022-08-12 LAB — BASIC METABOLIC PANEL
Anion gap: 8 (ref 5–15)
BUN: 15 mg/dL (ref 8–23)
CO2: 26 mmol/L (ref 22–32)
Calcium: 7.8 mg/dL — ABNORMAL LOW (ref 8.9–10.3)
Chloride: 103 mmol/L (ref 98–111)
Creatinine, Ser: 1.15 mg/dL (ref 0.61–1.24)
GFR, Estimated: >60 mL/min (ref >60)
Glucose, Bld: 110 mg/dL — ABNORMAL HIGH (ref 70–99)
Potassium: 3.3 mmol/L — ABNORMAL LOW (ref 3.5–5.1)
Sodium: 137 mmol/L (ref 135–145)

## 2022-08-12 LAB — PROCALCITONIN: Procalcitonin: 0.45 ng/mL

## 2022-08-12 LAB — LEGIONELLA PNEUMOPHILA SEROGP 1 UR AG: L. pneumophila Serogp 1 Ur Ag: NEGATIVE

## 2022-08-12 LAB — MAGNESIUM: Magnesium: 2.4 mg/dL (ref 1.7–2.4)

## 2022-08-12 MED ORDER — POTASSIUM CHLORIDE CRYS ER 20 MEQ PO TBCR
40.0000 meq | EXTENDED_RELEASE_TABLET | Freq: Once | ORAL | Status: AC
  Administered 2022-08-12: 40 meq via ORAL
  Filled 2022-08-12: qty 2

## 2022-08-12 MED ORDER — ACETAMINOPHEN 325 MG PO TABS: 650.0000 mg | ORAL_TABLET | Freq: Four times a day (QID) | ORAL | Status: DC | PRN

## 2022-08-12 NOTE — Progress Notes (Signed)
Mr. Brister feels great this morning.  Hopefully, he will be able to go home.  He has been given Maxipime and vancomycin.  Maybe, he will be transitioned over to oral antibiotics.    He had a chest x-ray and CT of the chest yesterday.  Both these still show the right lower lobe consolidation.  I told him it could take 3-4 weeks before these improve on radiographic studies.  His labs show a white count of 4.5.  Hemoglobin 12.  Platelet count 163,000.  Potassium 3.3.  BUN is 15 creatinine 1.15.  He is coughing up a little bit.  His sputum was cultured yesterday.  Report is not back yet.  He has had no fever.  He is using the incentive spirometer which he likes to use.  His vital signs show temperature of 98.1.  Pulse 79.  Blood pressure 141/85.  His lungs do sound pretty clear bilaterally.  He still has some rhonchi in the right base.  Cardiac exam regular rate and rhythm.  Abdomen is soft.  Extremity shows no clubbing, cyanosis or edema.  Again, I am the Mr. Yurkovich immune system should be okay.  His IgG level is fine.  No he has a low IgA and IgM level.  However, I do not think he would it would benefit from supplemental IVIG.  I do appreciate the incredible care he is gotten from everybody on [place], MD  Rodman Key 5:4

## 2022-08-12 NOTE — Telephone Encounter (Signed)
Can we set up clinic follow up with me in 6-8 weeks?

## 2022-08-12 NOTE — Progress Notes (Signed)
PROGRESS NOTE   Jimmy Day  MAU:[id number]    DOB: 08-29-1961    DOA: 08/08/2022  PCP: [name], NP   I have briefly reviewed patients previous medical records in [facility]  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  Symptoms have been going on for approximately 3 days prior to hospitalization.  No obvious known sick contacts. A CT renal stone protocol was ordered in the ER due to hematuria which showed no acute process in the abdomen or pelvis but it also revealed right lower lobe consolidation concerning for pneumonia.  He was empirically treated with IV azithromycin and ceftriaxone x3 days but continued to high fevers up to 103.1 F.  PCCM was consulted and patient was switched to cefepime and vancomycin.   Assessment & Plan:  Principal Problem:   CAP (community acquired pneumonia) Active Problems:   Hypotension   Kappa light chain myeloma ([place])   Chronic kidney disease, stage 3a ([place])   Status post bone marrow transplant ([place])   Lobar pneumonia ([name])/community-acquired pneumonia: Blood cultures x2: NTD, GI panel PCR negative, RVP negative, SARS coronavirus 2 by RT-PCR and flu panel PCR negative.  Sputum Gram stain 9/19 shows few gram-positive cocci, gram-negative rods and gram-positive rods, culture sensitivities pending.  MRSA PCR negative.  Urine streptococcal and Legionella antigen negative.  Despite IV ceftriaxone and azithromycin x3 days, was spiking fevers up to 103.1 F.  Therefore, CT chest without contrast obtained on 9/19: Consolidation in the right lower lobe compatible with pneumonia and osseous changes compatible with multiple myeloma.  Relatively immunosuppressed man Revlimid on hold as agreed by oncology as well.  IV antibiotics were switched to vancomycin and  cefepime.  Appears to have defervesced with no further fever since 9/19 early a.m.  No tremors.  Since MRSA PCR negative, discontinued vancomycin, continue cefepime.  Procalcitonin essentially plateaued in the 0.5 range.  Hypotension - Patient developed hypotension and dizziness 9/19 when ambulating - Losartan was held, bolused with 500 mL IV normal saline with resolution of hypotension. -Continue to hold losartan unless start seeing BP rise.   Kappa light chain myeloma (HCC) - IgG kappa myeloma - Follows with oncology outpatient (seen by Dr. Marin Olp while hospitalized as well) -Tolerates Revlimid; currently on hold    Chronic kidney disease, stage 3a ([place]) - patient has history of CKD3a. Baseline creat ~ 1.2 - 1.3, eGFR 54 -Stable.   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   Hypokalemia: Replaced.  Follow.  Magnesium 2.4.  Body mass index is 24.79 kg/m.    DVT prophylaxis: enoxaparin (LOVENOX) injection 40 mg Start: 08/09/22 1000     Code Status: Full Code:  Family Communication: Discussed with patient's spouse via patient's phone on speaker. Disposition:  Status is: Inpatient Remains inpatient appropriate because: IV antibiotics due to high fevers that failed initial empiric antibiotics for pneumonia.     Consultants:   PCCM Medical oncology  Procedures:   None  Antimicrobials:   As noted above   Subjective:  Feels well.  Denies complaints.  No fever, chills or tremors.  Denies cough, dyspnea or chest pain.  Tolerating diet.  Seems eager to DC home but advised him that since its been less than 48 hours since high fevers, may probably want to observe him for  another 24 hours.  Also advised him that we will wait on PCCM to evaluate and make recommendations regarding discharge disposition.  Objective:   Vitals:   08/11/22 1303 08/11/22 1330 08/11/22 2100 08/12/22 0400  BP: 99/61  (!) 144/88 (!) 141/85  Pulse: 93  78 79  Resp: (!) _0 Temp: 99 F (37.2 C)  98.5 F (36.9 C) 98.1 F (36.7 C)  TempSrc: Oral  Oral Oral  SpO2: 99%  98% 96%  Weight:      Height:        General exam: Young male, moderately built and nourished sitting up comfortably in chair.  Appears nontoxic. Respiratory system: Reduced breath sounds right base with few basal crackles and possible pleural rub.  Rest of lung fields clear to auscultation.  No increased work of breathing. Cardiovascular system: S1 & S2 heard, RRR. No JVD, murmurs, rubs, gallops or clicks. No pedal edema.  Telemetry personally reviewed: Sinus rhythm.  Telemetry discontinued 9/20. Gastrointestinal system: Abdomen is nondistended, soft and nontender. No organomegaly or masses felt. Normal bowel sounds heard. Central nervous system: Alert and oriented. No focal neurological deficits. Extremities: Symmetric 5 x 5 power. Skin: No rashes, lesions or ulcers Psychiatry: Judgement and insight appear normal. Mood & affect appropriate.     Data Reviewed:   I have personally reviewed following labs and imaging studies   CBC: Recent Labs  Lab 08/10/22 0337 08/11/22 0336 08/12/22 0351  WBC 5.0 5.7 4.5  NEUTROABS 4.0 4.7 3.3  HGB 13.6 13.4 12.0*  HCT 38.7* 38.5* 34.4*  MCV 91.9 91.4 89.8  PLT 151 146* 761    Basic Metabolic Panel: Recent Labs  Lab 08/08/22 1646 08/09/22 0955 08/10/22 0337 08/11/22 0336 08/12/22 0351  NA 131* 133* 136 136 137  K 3.2* 4.1 3.2* 3.8 3.3*  CL 99 101 105 101 103  CO2 _1 GLUCOSE 118* 120* 114* 112* 110*  BUN _2 CREATININE 1.46* 1.49* 1.34* 1.21 1.15  CALCIUM 8.1* 8.2* 7.9* 7.6* 7.8*  MG  --  2.0 2.0 2.2 2.4    Liver Function Tests: Recent Labs  Lab 08/08/22 1646  AST 17  ALT 13  ALKPHOS 30*  BILITOT 0.7  PROT 6.9  ALBUMIN 3.5    CBG: No results for input(s): "GLUCAP" in the last 168 hours.  Microbiology Studies:   Recent Results (from the past 240 hour(s))  Resp Panel by RT-PCR (Flu A&B,  Covid) Anterior Nasal Swab     Status: None   Collection Time: 08/08/22  3:45 PM   Specimen: Anterior Nasal Swab  Result Value Ref Range Status   SARS Coronavirus 2 by RT PCR NEGATIVE NEGATIVE Final    Comment: (NOTE) SARS-CoV-2 target nucleic acids are NOT DETECTED.  The SARS-CoV-2 RNA is generally detectable in upper respiratory specimens during the acute phase of infection. The lowest concentration of SARS-CoV-2 viral copies this assay can detect is 138 copies/mL. A negative result does not preclude SARS-Cov-2 infection and should not be used as the sole basis for treatment or other patient management decisions. A negative result may occur with  improper specimen collection/handling, submission of specimen other than nasopharyngeal swab, presence of viral mutation(s) within the areas targeted by this assay, and inadequate number of viral copies(<138 copies/mL). A negative result must be combined with clinical observations, patient history, and epidemiological information. The expected result is Negative.  Fact Sheet for Patients:  EntrepreneurPulse.com.au  Fact Sheet for Healthcare Providers:  IncredibleEmployment.be  This test is no t yet approved or cleared by the [place] FDA and  has been authorized for detection and/or diagnosis of SARS-CoV-2 by FDA under an Emergency Use Authorization (EUA). This EUA will remain  in effect (meaning this test can be used) for the duration of the COVID-19 declaration under Section 564(b)(1) of the Act, 21 U.S.C.section 360bbb-3(b)(1), unless the authorization is terminated  or revoked sooner.       Influenza A by PCR NEGATIVE NEGATIVE Final   Influenza B by PCR NEGATIVE NEGATIVE Final    Comment: (NOTE) The Xpert Xpress SARS-CoV-2/FLU/RSV plus assay is intended as an aid in the diagnosis of influenza from Nasopharyngeal swab specimens and should not be used as a sole basis for treatment. Nasal  washings and aspirates are unacceptable for Xpert Xpress SARS-CoV-2/FLU/RSV testing.  Fact Sheet for Patients: EntrepreneurPulse.com.au  Fact Sheet for Healthcare Providers: IncredibleEmployment.be  This test is not yet approved or cleared by the [place] FDA and has been authorized for detection and/or diagnosis of SARS-CoV-2 by FDA under an Emergency Use Authorization (EUA). This EUA will remain in effect (meaning this test can be used) for the duration of the COVID-19 declaration under Section 564(b)(1) of the Act, 21 U.S.C. section 360bbb-3(b)(1), unless the authorization is terminated or revoked.  Performed at [facility], [address] [place], [place] [address]   Blood culture (routine x 2)     Status: None (Preliminary result)   Collection Time: 08/08/22  4:47 PM   Specimen: BLOOD  Result Value Ref Range Status   Specimen Description   Final    BLOOD RIGHT ANTECUBITAL Performed at [facility], [address] [place], [place] [address]    Special Requests   Final    BOTTLES DRAWN AEROBIC AND ANAEROBIC Blood Culture adequate volume Performed at [facility], [address] [place], [place] [address]    Culture   Final    NO GROWTH 2 DAYS Performed at [facility], [place] [address] [place], [place] [address]    Report Status PENDING  Incomplete  Blood culture (routine x 2)     Status: None (Preliminary result)   Collection Time: 08/08/22  4:55 PM   Specimen: BLOOD RIGHT WRIST  Result Value Ref Range Status   Specimen Description   Final    BLOOD RIGHT WRIST Performed at [facility], [address] [place], [place] [address]    Special Requests   Final    BOTTLES DRAWN AEROBIC AND ANAEROBIC Blood Culture adequate volume Performed at [facility], [address] [place], [place] [address]    Culture   Final    NO GROWTH 2 DAYS Performed at [facility], [place] [address] [place], [place] [address]    Report Status PENDING  Incomplete  Gastrointestinal Panel by PCR , Stool     Status: None   Collection Time: 08/09/22 11:07 AM   Specimen: Stool  Result Value Ref Range Status   Campylobacter species NOT DETECTED NOT DETECTED Final   Plesimonas shigelloides NOT DETECTED NOT DETECTED Final   Salmonella species NOT DETECTED NOT DETECTED Final   Yersinia enterocolitica NOT DETECTED NOT DETECTED Final   Vibrio species NOT DETECTED NOT DETECTED Final   Vibrio cholerae NOT DETECTED NOT DETECTED Final   Enteroaggregative E coli (EAEC) NOT DETECTED NOT DETECTED Final  Enteropathogenic E coli (EPEC) NOT DETECTED NOT DETECTED Final   Enterotoxigenic E coli (ETEC) NOT DETECTED NOT DETECTED Final   Shiga like toxin producing E coli (STEC) NOT DETECTED NOT DETECTED Final   Shigella/Enteroinvasive E coli (EIEC) NOT DETECTED NOT DETECTED Final   Cryptosporidium NOT DETECTED NOT DETECTED Final   Cyclospora cayetanensis NOT DETECTED NOT DETECTED Final   Entamoeba histolytica NOT DETECTED NOT DETECTED Final   Giardia lamblia NOT DETECTED NOT DETECTED Final   Adenovirus F40/41 NOT DETECTED NOT DETECTED Final   Astrovirus NOT DETECTED NOT DETECTED Final   Norovirus GI/GII NOT DETECTED NOT DETECTED Final   Rotavirus A NOT DETECTED NOT DETECTED Final   Sapovirus (I, II, IV, and V) NOT DETECTED NOT DETECTED Final    Comment: Performed at [facility], [address] [place], [place] [address]  Respiratory (~20 pathogens) panel by PCR     Status: None   Collection Time: 08/09/22  1:17 PM   Specimen: Nasopharyngeal Swab; Respiratory  Result Value Ref Range Status   Adenovirus NOT DETECTED NOT DETECTED Final   Coronavirus 229E NOT DETECTED NOT DETECTED Final    Comment: (NOTE) The Coronavirus on the Respiratory Panel, DOES NOT test for the novel  Coronavirus (2019 nCoV)    Coronavirus HKU1 NOT DETECTED NOT DETECTED Final   Coronavirus  NL63 NOT DETECTED NOT DETECTED Final   Coronavirus OC43 NOT DETECTED NOT DETECTED Final   Metapneumovirus NOT DETECTED NOT DETECTED Final   Rhinovirus / Enterovirus NOT DETECTED NOT DETECTED Final   Influenza A NOT DETECTED NOT DETECTED Final   Influenza B NOT DETECTED NOT DETECTED Final   Parainfluenza Virus 1 NOT DETECTED NOT DETECTED Final   Parainfluenza Virus 2 NOT DETECTED NOT DETECTED Final   Parainfluenza Virus 3 NOT DETECTED NOT DETECTED Final   Parainfluenza Virus 4 NOT DETECTED NOT DETECTED Final   Respiratory Syncytial Virus NOT DETECTED NOT DETECTED Final   Bordetella pertussis NOT DETECTED NOT DETECTED Final   Bordetella Parapertussis NOT DETECTED NOT DETECTED Final   Chlamydophila pneumoniae NOT DETECTED NOT DETECTED Final   Mycoplasma pneumoniae NOT DETECTED NOT DETECTED Final    Comment: Performed at [facility], [place]. [address], [place], [place] [address]  Expectorated Sputum Assessment w Gram Stain, Rflx to Resp Cult     Status: None   Collection Time: 08/11/22  1:10 PM   Specimen: Sputum  Result Value Ref Range Status   Specimen Description SPUTUM  Final   Special Requests NONE  Final   Sputum evaluation   Final    THIS SPECIMEN IS ACCEPTABLE FOR SPUTUM CULTURE Performed at [facility], [place] [address], [place], [place] [address]    Report Status 08/11/2022 FINAL  Final  Culture, Respiratory w Gram Stain     Status: None (Preliminary result)   Collection Time: 08/11/22  1:10 PM   Specimen: SPU  Result Value Ref Range Status   Specimen Description   Final    SPUTUM Performed at [facility] [address] [place], [place] [address]    Special Requests   Final    NONE Reflexed from [id number] Performed at [facility] [address] [place], [place] [address]    Gram Stain   Final    RARE WBC PRESENT,BOTH PMN AND MONONUCLEAR FEW GRAM POSITIVE COCCI FEW GRAM NEGATIVE RODS FEW GRAM POSITIVE  RODS Performed at [facility], [place] [address], [place], [place] [address]  Culture PENDING  Incomplete   Report Status PENDING  Incomplete  MRSA Next Gen by PCR, Nasal     Status: None   Collection Time: 08/11/22  7:16 PM   Specimen: Nasal Mucosa; Nasal Swab  Result Value Ref Range Status   MRSA by PCR Next Gen NOT DETECTED NOT DETECTED Final    Comment: (NOTE) The GeneXpert MRSA Assay (FDA approved for NASAL specimens only), is one component of a comprehensive MRSA colonization surveillance program. It is not intended to diagnose MRSA infection nor to guide or monitor treatment for MRSA infections. Test performance is not FDA approved in patients less than 70 years old. Performed at [facility], [place] [address], [place], [place] [address]     Radiology Studies:  CT CHEST WO CONTRAST  Result Date: 08/11/2022 CLINICAL DATA:  Pneumonia, complication suspected, xray done persistent fevers; CAP, hx MM EXAM: CT CHEST WITHOUT CONTRAST TECHNIQUE: Multidetector CT imaging of the chest was performed following the standard protocol without IV contrast. RADIATION DOSE REDUCTION: This exam was performed according to the departmental dose-optimization program which includes automated exposure control, adjustment of the mA and/or kV according to patient size and/or use of iterative reconstruction technique. COMPARISON:  PET CT 05/29/2022 FINDINGS: Cardiovascular: Heart is normal size. Aorta is normal caliber. Mediastinum/Nodes: No mediastinal, hilar, or axillary adenopathy. Trachea and esophagus are unremarkable. Thyroid unremarkable. Lungs/Pleura: Consolidation throughout much of the right lower lobe compatible with pneumonia. No effusions or pneumothorax. Left lung clear. Upper Abdomen: No acute findings Musculoskeletal: Chest wall soft tissues are unremarkable. Numerous lytic lesions throughout the osseous structures compatible with history of multiple myeloma, similar to  prior study. IMPRESSION: Consolidation in the right lower lobe compatible with pneumonia. Osseous changes compatible with multiple myeloma, stable. Electronically Signed   By: Rolm Baptise M.D.   On: 08/11/2022 10:08   DG CHEST PORT 1 VIEW  Result Date: 08/11/2022 CLINICAL DATA:  Right lower lobe consolidation/pneumonia. EXAM: PORTABLE CHEST 1 VIEW COMPARISON:  08/08/2022. FINDINGS: There is patchy consolidation in the right lower lung field, with preservation of the right heart border consistent with a right lower lobe infiltrate. Remainder of the lungs are clear with no significant pleural collections. No pneumothorax. Heart size and vasculature are normal. The mediastinum is normally outlined. There is fusion hardware in the cervical spine. IMPRESSION: Persistent right lower lobe consolidation. No new or worsening infiltrate. Electronically Signed   By: Telford Nab M.D.   On: 08/11/2022 05:34    Scheduled Meds:    brimonidine  1 drop Both Eyes Daily   cholecalciferol  1,000 Units Oral Daily   enoxaparin (LOVENOX) injection  40 mg Subcutaneous Q24H   omega-3 acid ethyl esters  2 capsule Oral BID   potassium chloride  40 mEq Oral Once   tamsulosin  0.4 mg Oral Daily    Continuous Infusions:    ceFEPime (MAXIPIME) IV Stopped (08/12/22 0234)   vancomycin       LOS: 3 days     Vernell Leep, MD,  FACP, [facility], [facility], [facility] (Care Management Physician Certified) [place]  To contact the attending provider between 7A-7P or the covering provider during after hours 7P-7A, please log into the web site [email or web address] and access using universal [place] password for that web site. If you do not have the password, please call the hospital operator.  08/12/2022, 9:08 AM

## 2022-08-12 NOTE — Telephone Encounter (Signed)
Scheduled for HFU with NM on 11/7. This will be printed on discharge papers.

## 2022-08-12 NOTE — Progress Notes (Signed)
  Transition of Care [facility]) Screening Note   Patient Details  Name: ANUBIS FUNDORA Date of Birth: 1961-06-07   Transition of Care (TOC) CM/SW Contact:    Joaquin Courts, RN Phone Number: 08/12/2022, 9:44 AM    Transition of Care Department [facility] has reviewed patient and no TOC needs have been identified at this time. We will continue to monitor patient advancement through interdisciplinary progression rounds. If new patient transition needs arise, please place a TOC consult.

## 2022-08-12 NOTE — Consult Note (Signed)
   NAME:  Jimmy Day, MRN:  [id number], DOB:  [birth year], LOS: 3 ADMISSION DATE:  08/08/2022, CONSULTATION DATE:  08/11/22 REFERRING MD:  Sabino Gasser, CHIEF COMPLAINT:  fever, HA, cough   History of Present Illness:  61yM with history of MM s/p autologous BMT 2016 on revlimid, HTN, who presented with acute fever, cough, HA, nasal congestion. CT renal stone protocol revealed dense RLL consolidation, admitted and started on CAP coverage 9/16 yet has remained febrile to 39 throughout admission. Noninvasive infectious workup to date is unrevealing. On 9/19 PCCM consulted for nonresolving pneumonia, broadened to vanc/cefepime and plan to attempt to obtain sputum culture.    Pertinent  Medical History  Kappa light chain MM s/p autologous BMT HTN CKD3  Significant Hospital Events: Including procedures, antibiotic start and stop dates in addition to other pertinent events   9/16 admitted, started on CTX/azithromycin for presumed CAP  Interim History / Subjective:  Defervesced. Feeling better today.  Objective   Blood pressure (!) 140/87, pulse 93, temperature 98.5 F (36.9 C), temperature source Oral, resp. rate 20, height 6' (1.829 m), weight 82.9 kg, SpO2 98 %.        Intake/Output Summary (Last 24 hours) at 08/12/2022 1447 Last data filed at 08/12/2022 0500 Gross per 24 hour  Intake 580 ml  Output 300 ml  Net 280 ml   Filed Weights   08/08/22 1539 08/09/22 0158  Weight: 87.1 kg 82.9 kg    Examination: General appearance: 61 y.o., male, NAD, conversant  Eyes: anicteric sclerae; PERRL, tracking appropriately HENT: NCAT; MMM Neck: Trachea midline; no lymphadenopathy, no JVD Lungs: +crackles RLL, with normal respiratory effort CV: RRR, no murmur  Abdomen: Soft, non-tender; non-distended, BS present  Extremities: No peripheral edema, warm Skin: Normal turgor and texture; no rash Psych: Appropriate affect Neuro: Alert and oriented to person and place, no focal deficit    Labs/imaging  reviewed: Urine legionella neg Sputum cx pending  No new imaging   Resolved Hospital Problem list    Assessment & Plan:   # CAP - ABX per primary  - will set up clinic follow up in 6-8 weeks - he has no strict indication for surveillance imaging since he's never smoked and he's clinically improved in a reasonable time frame   We will sign off, glad to be reinvolved as condition changes  Best Practice (right click and "Reselect all SmartList Selections" daily)   Per TRH  Critical care time: n/a

## 2022-08-13 DIAGNOSIS — R918 Other nonspecific abnormal finding of lung field: Secondary | ICD-10-CM

## 2022-08-13 DIAGNOSIS — J189 Pneumonia, unspecified organism: Secondary | ICD-10-CM | POA: Diagnosis not present

## 2022-08-13 LAB — CBC WITH DIFFERENTIAL/PLATELET
Abs Immature Granulocytes: 0.02 10*3/uL (ref 0.00–0.07)
Basophils Absolute: 0.1 10*3/uL (ref 0.0–0.1)
Basophils Relative: 1 %
Eosinophils Absolute: 0.2 10*3/uL (ref 0.0–0.5)
Eosinophils Relative: 5 %
HCT: 33.7 % — ABNORMAL LOW (ref 39.0–52.0)
Hemoglobin: 11.7 g/dL — ABNORMAL LOW (ref 13.0–17.0)
Immature Granulocytes: 1 %
Lymphocytes Relative: 18 %
Lymphs Abs: 0.7 10*3/uL (ref 0.7–4.0)
MCH: 31.5 pg (ref 26.0–34.0)
MCHC: 34.7 g/dL (ref 30.0–36.0)
MCV: 90.8 fL (ref 80.0–100.0)
Monocytes Absolute: 0.5 10*3/uL (ref 0.1–1.0)
Monocytes Relative: 13 %
Neutro Abs: 2.3 10*3/uL (ref 1.7–7.7)
Neutrophils Relative %: 62 %
Platelets: 186 10*3/uL (ref 150–400)
RBC: 3.71 MIL/uL — ABNORMAL LOW (ref 4.22–5.81)
RDW: 13.9 % (ref 11.5–15.5)
WBC: 3.8 10*3/uL — ABNORMAL LOW (ref 4.0–10.5)
nRBC: 0 % (ref 0.0–0.2)

## 2022-08-13 LAB — BASIC METABOLIC PANEL
Anion gap: 5 (ref 5–15)
BUN: 14 mg/dL (ref 8–23)
CO2: 29 mmol/L (ref 22–32)
Calcium: 8.4 mg/dL — ABNORMAL LOW (ref 8.9–10.3)
Chloride: 105 mmol/L (ref 98–111)
Creatinine, Ser: 1.02 mg/dL (ref 0.61–1.24)
GFR, Estimated: >60 mL/min (ref >60)
Glucose, Bld: 112 mg/dL — ABNORMAL HIGH (ref 70–99)
Potassium: 3.5 mmol/L (ref 3.5–5.1)
Sodium: 139 mmol/L (ref 135–145)

## 2022-08-13 LAB — MAGNESIUM: Magnesium: 2.2 mg/dL (ref 1.7–2.4)

## 2022-08-13 MED ORDER — LEVOFLOXACIN 750 MG PO TABS: 750.0000 mg | ORAL_TABLET | Freq: Every day | ORAL | 0 refills | Status: AC

## 2022-08-13 NOTE — Discharge Summary (Signed)
Physician Discharge Summary  Jimmy Day BOF:[id number] DOB: February 14, 1961  PCP: Jimmy Alar, NP  Admitted from: Home Discharged to: Home  Admit date: 08/08/2022 Discharge date: 08/13/2022  Recommendations for Outpatient Follow-up:    Follow-up Information     Jimmy Alar, NP. Schedule an appointment as soon as possible for a visit in 1 week(s).   Specialty: Internal Medicine Why: To be seen with repeat labs (CBC with differential, BMP & mag). Contact information: [place] [address] [phone]         Jimmy Hurter, MD Follow up.   Specialty: Pulmonary Disease Why: Keep the new hospital follow up appointment. Contact information: [place] [address] [phone]         Jimmy Napoleon, MD. Schedule an appointment as soon as possible for a visit.   Specialty: Oncology Contact information: [address] [place] [address] [phone]                  Home Health: None    Equipment/Devices: None    Discharge Condition: Improved and stable   Code Status: Full Code Diet recommendation:  Discharge Diet Orders (From admission, onward)     Start     Ordered   08/13/22 0000  Diet - low sodium heart healthy        08/13/22 0835             Discharge Diagnoses:  Principal Problem:   CAP (community acquired pneumonia) Active Problems:   Hypotension   [name]   Chronic kidney disease, stage 3a ([name])   Status post bone marrow transplant ([name])   Brief Summary: 61 yo married male with PMH 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  Symptoms have been going on for approximately 3 days prior to hospitalization.  No obvious known sick contacts. A CT renal stone protocol was ordered in the ER due to hematuria which showed no acute process in the  abdomen or pelvis but it also revealed right lower lobe consolidation concerning for pneumonia.  He was empirically treated with IV azithromycin and ceftriaxone x3 days but continued to high fevers up to 103.1 F.  PCCM was consulted and patient was switched to cefepime and vancomycin.     Assessment & Plan:  Principal Problem:   CAP (community acquired pneumonia) Active Problems:   Hypotension   Kappa light chain myeloma ([name])   Chronic kidney disease, stage 3a ([name])   Status post bone marrow transplant ([name])     Lobar pneumonia ([name])/community-acquired pneumonia: Blood cultures x2: NTD, GI panel PCR negative, RVP negative, SARS coronavirus 2 by RT-PCR and flu panel PCR negative.  Sputum Gram stain 9/19 shows few gram-positive cocci, gram-negative rods and gram-positive rods, culture sensitivities pending.  MRSA PCR negative.  Urine streptococcal and Legionella antigen negative.  Despite IV ceftriaxone and azithromycin x3 days, was spiking fevers up to 103.1 F.  Therefore, CT chest without contrast obtained on 9/19: Consolidation in the right lower lobe compatible with pneumonia and osseous changes compatible with multiple myeloma.  Relatively immunosuppressed man Revlimid on hold as agreed by oncology as well.  IV antibiotics were switched to vancomycin and cefepime.  Appears to have defervesced with no further fever since 9/19 early a.m.  No tremors.  Since MRSA PCR negative, discontinued vancomycin, continue cefepime.  Procalcitonin  essentially plateaued in the 0.5 range.   Hypotension - Patient developed hypotension and dizziness 9/19 when ambulating - Losartan was held, bolused with 500 mL IV normal saline with resolution of hypotension. -Continue to hold losartan unless start seeing BP rise.   Kappa light chain myeloma (HCC) - IgG kappa myeloma - Follows with oncology outpatient (seen by Dr. Marin Day while hospitalized as well) -Tolerates Revlimid; currently on hold    Chronic kidney  disease, stage 3a ([name]) - patient has history of CKD3a. Baseline creat ~ 1.2 - 1.3, eGFR 54 -Stable.   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   Hypokalemia: Replaced.  Follow.  Magnesium 2.4.   Body mass index is 24.79 kg/m.       DVT prophylaxis: enoxaparin (LOVENOX) injection 40 mg Start: 08/09/22 1000     Code Status: Full Code:  Family Communication: Discussed with patient's spouse via patient's phone on speaker. Disposition:  Status is: Inpatient Remains inpatient appropriate because: IV antibiotics due to high fevers that failed initial empiric antibiotics for pneumonia.       Consultants:   PCCM Medical oncology   Procedures:   None   Discharge Instructions  Discharge Instructions     Call MD for:  difficulty breathing, headache or visual disturbances   Complete by: As directed    Call MD for:  extreme fatigue   Complete by: As directed    Call MD for:  persistant dizziness or light-headedness   Complete by: As directed    Call MD for:  persistant nausea and vomiting   Complete by: As directed    Call MD for:  severe uncontrolled pain   Complete by: As directed    Call MD for:  temperature >100.4   Complete by: As directed    Diet - low sodium heart healthy   Complete by: As directed    Increase activity slowly   Complete by: As directed         Medication List     STOP taking these medications    lenalidomide 10 MG capsule Commonly known as: REVLIMID   losartan 50 MG tablet Commonly known as: COZAAR       TAKE these medications    cholecalciferol 25 MCG (1000 UNIT) tablet Commonly known as: VITAMIN D3 Take 1 tablet (1,000 Units total) by mouth daily.   levofloxacin 750 MG tablet Commonly known as: Levaquin Take 1 tablet (750 mg total) by mouth daily for 5 days.   Lumify 0.025 % Soln Generic drug: Brimonidine Tartrate Place 1 drop into both eyes daily.   omega-3 acid ethyl esters 1 g capsule Commonly  known as: LOVAZA Take 2 capsules (2 g total) by mouth 2 (two) times daily.   tadalafil 5 MG tablet Commonly known as: CIALIS Take 1 tablet by mouth everyday   tamsulosin 0.4 MG Caps capsule Commonly known as: FLOMAX Take 1 CAPSULE by mouth ONCE DAILY What changed:  how much to take when to take this       No Known Allergies    Procedures/Studies: CT CHEST WO CONTRAST  Result Date: 08/11/2022 CLINICAL DATA:  Pneumonia, complication suspected, xray done persistent fevers; CAP, hx MM EXAM: CT CHEST WITHOUT CONTRAST TECHNIQUE: Multidetector CT imaging of the chest was performed following the standard protocol without IV contrast. RADIATION DOSE REDUCTION: This exam was performed according to the departmental dose-optimization program which includes automated exposure control, adjustment of the mA and/or kV according to patient size and/or use of  iterative reconstruction technique. COMPARISON:  PET CT 05/29/2022 FINDINGS: Cardiovascular: Heart is normal size. Aorta is normal caliber. Mediastinum/Nodes: No mediastinal, hilar, or axillary adenopathy. Trachea and esophagus are unremarkable. Thyroid unremarkable. Lungs/Pleura: Consolidation throughout much of the right lower lobe compatible with pneumonia. No effusions or pneumothorax. Left lung clear. Upper Abdomen: No acute findings Musculoskeletal: Chest wall soft tissues are unremarkable. Numerous lytic lesions throughout the osseous structures compatible with history of multiple myeloma, similar to prior study. IMPRESSION: Consolidation in the right lower lobe compatible with pneumonia. Osseous changes compatible with multiple myeloma, stable. Electronically Signed   By: Rolm Baptise M.D.   On: 08/11/2022 10:08   DG CHEST PORT 1 VIEW  Result Date: 08/11/2022 CLINICAL DATA:  Right lower lobe consolidation/pneumonia. EXAM: PORTABLE CHEST 1 VIEW COMPARISON:  08/08/2022. FINDINGS: There is patchy consolidation in the right lower lung field, with  preservation of the right heart border consistent with a right lower lobe infiltrate. Remainder of the lungs are clear with no significant pleural collections. No pneumothorax. Heart size and vasculature are normal. The mediastinum is normally outlined. There is fusion hardware in the cervical spine. IMPRESSION: Persistent right lower lobe consolidation. No new or worsening infiltrate. Electronically Signed   By: Telford Nab M.D.   On: 08/11/2022 05:34   CT Renal Stone Study  Result Date: 08/08/2022 CLINICAL DATA:  Hematuria. EXAM: CT ABDOMEN AND PELVIS WITHOUT CONTRAST TECHNIQUE: Multidetector CT imaging of the abdomen and pelvis was performed following the standard protocol without IV contrast. RADIATION DOSE REDUCTION: This exam was performed according to the departmental dose-optimization program which includes automated exposure control, adjustment of the mA and/or kV according to patient size and/or use of iterative reconstruction technique. COMPARISON:  CT renal stone 04/29/2022.  PET-CT 05/29/2022. FINDINGS: Lower chest: There is airspace consolidation in the visualized right lower lobe compatible with pneumonia. Hepatobiliary: Diffusely heterogeneous. This may be related to artifact from overlying external wires. Focal lesion is difficult to exclude on this study. Gallbladder and bile ducts are within normal limits. Pancreas: Unremarkable. No pancreatic ductal dilatation or surrounding inflammatory changes. Spleen: Normal in size without focal abnormality. Adrenals/Urinary Tract: Adrenal glands are unremarkable. Kidneys are normal, without renal calculi, focal lesion, or hydronephrosis. Bladder is unremarkable. Stomach/Bowel: Stomach is within normal limits. Appendix appears normal. No evidence of bowel wall thickening, distention, or inflammatory changes. Vascular/Lymphatic: No significant vascular findings are present. No enlarged abdominal or pelvic lymph nodes. Reproductive: Prostate gland is  enlarged. Other: No abdominal wall hernia or abnormality. No abdominopelvic ascites. Musculoskeletal: No acute or significant osseous findings. IMPRESSION: 1. Right lower lobe airspace consolidation compatible with pneumonia. Recommend follow-up PA and lateral chest x-ray in 4-6 weeks to confirm complete resolution. 2. No acute localizing process in the abdomen or pelvis. 3. Prostatomegaly. Electronically Signed   By: Ronney Asters M.D.   On: 08/08/2022 21:40   CT Head Wo Contrast  Result Date: 08/08/2022 CLINICAL DATA:  Headache, new or worsening (Age >= 50y). Fever headache body aches , diarrhea . Lethargic EXAM: CT HEAD WITHOUT CONTRAST TECHNIQUE: Contiguous axial images were obtained from the base of the skull through the vertex without intravenous contrast. RADIATION DOSE REDUCTION: This exam was performed according to the departmental dose-optimization program which includes automated exposure control, adjustment of the mA and/or kV according to patient size and/or use of iterative reconstruction technique. COMPARISON:  CT head 12/28/2020 FINDINGS: Brain: No evidence of large-territorial acute infarction. No parenchymal hemorrhage. No mass lesion. No extra-axial collection. No mass effect  or midline shift. No hydrocephalus. Basilar cisterns are patent. Vascular: No hyperdense vessel. Skull: No acute fracture or focal lesion. Sinuses/Orbits: Paranasal sinuses and mastoid air cells are clear. The orbits are unremarkable. Other: None. IMPRESSION: No acute intracranial abnormality. Electronically Signed   By: Iven Finn M.D.   On: 08/08/2022 19:34   DG Chest Portable 1 View  Result Date: 08/08/2022 CLINICAL DATA:  Fever, headaches, diarrhea EXAM: PORTABLE CHEST 1 VIEW COMPARISON:  12/28/2020 FINDINGS: The heart size and mediastinal contours are within normal limits. Both lungs are clear. The visualized skeletal structures are unremarkable. IMPRESSION: No acute abnormality of the lungs in AP portable  projection. Electronically Signed   By: Delanna Ahmadi M.D.   On: 08/08/2022 16:50      Subjective:   Discharge Exam:  Vitals:   08/12/22 0400 08/12/22 1419 08/12/22 2114 08/13/22 0250  BP: (!) 141/85 (!) 140/87 (!) 134/96 (!) 146/87  Pulse: 79 93 79 77  Resp: _0 Temp: 98.1 F (36.7 C) 98.5 F (36.9 C) 98.2 F (36.8 C) 98 F (36.7 C)  TempSrc: Oral Oral  Oral  SpO2: 96% 98% 98% 100%  Weight:      Height:        Expand All Collapse All   PROGRESS NOTE             Jimmy Day           JSH:[id number]    DOB: 12-03-1960    DOA: 08/08/2022   PCP: Jimmy Alar, NP    I have briefly reviewed patients previous medical records in [facility].      Chief Complaint  Patient presents with   Fever      Brief Narrative:  61 yo married male with PMH 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  Symptoms have been going on for approximately 3 days prior to hospitalization.  No obvious known sick contacts. A CT renal stone protocol was ordered in the ER due to hematuria which showed no acute process in the abdomen or pelvis but it also revealed right lower lobe consolidation concerning for pneumonia.  He was empirically treated with IV azithromycin and ceftriaxone x3 days but continued to high fevers up to 103.1 F.  PCCM was consulted and patient was switched to cefepime and vancomycin.     Assessment & Plan:   Lobar pneumonia ([name])/community-acquired pneumonia: Blood cultures x2: NTD, GI panel PCR negative, RVP negative, SARS coronavirus 2 by RT-PCR and flu panel PCR negative.  Sputum Gram stain 9/19 shows few gram-positive cocci, gram-negative rods and gram-positive rods, culture sensitivities pending.  MRSA PCR negative.  Urine streptococcal and Legionella antigen negative.  Despite IV ceftriaxone and azithromycin x3 days, was spiking fevers up to 103.1 F.  Therefore, CT  chest without contrast obtained on 9/19: Consolidation in the right lower lobe compatible with pneumonia and osseous changes compatible with multiple myeloma.  Relatively immunosuppressed man Revlimid on hold as agreed by oncology as well.  IV antibiotics were switched to vancomycin x1 dose and cefepime. Since MRSA PCR negative, discontinued vancomycin, continue cefepime.  Procalcitonin essentially plateaued in the 0.5 range.  Defervesced since early morning of 9/19.  PCCM signed off yesterday.  As per my communication with Dr. Verlee Monte, PCCM yesterday, initial thoughts was of discharging him on Augmentin to complete 7-day course of antibiotics.  However on day of discharge, discussed with ID  who recommended that since he had defervesced on cefepime, they recommended discharging on levofloxacin.  Discharged on additional 5 days of levofloxacin to complete total 7 days beginning day of defervesced since 9/19.  PCCM has already arranged outpatient follow-up in a few weeks.  Consider repeating CT chest without contrast or chest x-ray during follow-up to ensure resolution of pneumonia.  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   Hypotension - Patient developed hypotension and dizziness 9/19 when ambulating - Losartan was held, bolused with 500 mL IV normal saline with resolution of hypotension. -As per review of home meds, it appears that patient has not taken losartan since last month.  Blood pressures are reasonable and at times mildly uncontrolled.  Recommend follow-up with his PCP regarding need for resuming the losartan.   Kappa light chain myeloma (HCC) - IgG kappa myeloma - Follows with oncology outpatient (seen by Dr. Marin Day while hospitalized as well) -Tolerates Revlimid; currently on hold given this admission with high fevers in the context of pneumonia.  Outpatient follow-up with oncology to determine timing of resumption of Revlimid.   Chronic kidney disease, stage 3a ([place]) -  patient has history of CKD3a. Baseline creat ~ 1.2 - 1.3, eGFR 54 -Stable.   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   Hypokalemia: Replaced. Magnesium 2.4.  Microscopic hematuria: No UTI symptoms.  Unclear etiology.  Recommend repeating urine microscopy in a couple of weeks and if this persist then may need further evaluation including a urology consultation.  Normocytic anemia: Likely multifactorial due to acute illness and hemodilution.  Stable compared to yesterday.  Outpatient follow-up with PCP/oncology.   Body mass index is 24.79 kg/m.     Consultants:   PCCM Medical oncology   Procedures:   None   Subjective:  Denies complaints.  Anxious to discharge home.  Denies cough, chest pain, dyspnea, fever or chills.  Has good appetite.  No ambulatory dysfunction.  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  Objective:          Vitals:    08/11/22 1303 08/11/22 1330 08/11/22 2100 08/12/22 0400  BP: 99/61   (!) 144/88 (!) 141/85  Pulse: 93   78 79  Resp: (!) _0 Temp: 99 F (37.2 C)   98.5 F (36.9 C) 98.1 F (36.7 C)  TempSrc: Oral   Oral Oral  SpO2: 99%   98% 96%  Weight:          Height:              General exam: Young male, moderately built and nourished sitting up comfortably in chair.  Appears nontoxic. Respiratory system: Reduced breath sounds right base with few basal crackles and possible pleural rub.  Rest of lung fields clear to auscultation.  No increased work of breathing. Cardiovascular system: S1 & S2 heard, RRR. No JVD, murmurs, rubs, gallops or clicks. No pedal edema.  Telemetry personally reviewed: Sinus rhythm. Gastrointestinal system: Abdomen is nondistended, soft and nontender. No organomegaly or masses felt. Normal bowel sounds heard. Central nervous system: Alert and oriented. No focal neurological deficits. Extremities: Symmetric 5 x 5 power. Skin: No rashes, lesions or ulcers Psychiatry: Judgement and insight  appear normal. Mood & affect appropriate.       The results of significant diagnostics from this hospitalization (including imaging, microbiology, ancillary and laboratory) are listed below for reference.     Microbiology: Recent Results (from the past 240 hour(s))  Resp Panel by RT-PCR (Flu A&B, Covid) Anterior Nasal Swab     Status: None   Collection Time: 08/08/22  3:45 PM   Specimen: Anterior Nasal Swab  Result Value Ref Range Status   SARS Coronavirus 2 by RT PCR NEGATIVE NEGATIVE Final    Comment: (NOTE) SARS-CoV-2 target nucleic acids are NOT DETECTED.  The SARS-CoV-2 RNA is generally detectable in upper respiratory specimens during the acute phase of infection. The lowest concentration of SARS-CoV-2 viral copies this assay can detect is 138 copies/mL. A negative result does not preclude SARS-Cov-2 infection and should not be used as the sole basis for treatment or other patient management decisions. A negative result may occur with  improper specimen collection/handling, submission of specimen other than nasopharyngeal swab, presence of viral mutation(s) within the areas targeted by this assay, and inadequate number of viral copies(<138 copies/mL). A negative result must be combined with clinical observations, patient history, and epidemiological information. The expected result is Negative.  Fact Sheet for Patients:  EntrepreneurPulse.com.au  Fact Sheet for Healthcare Providers:  IncredibleEmployment.be  This test is no t yet approved or cleared by the [place] FDA and  has been authorized for detection and/or diagnosis of SARS-CoV-2 by FDA under an Emergency Use Authorization (EUA). This EUA will remain  in effect (meaning this test can be used) for the duration of the COVID-19 declaration under Section 564(b)(1) of the Act, 21 U.S.C.section 360bbb-3(b)(1), unless the authorization is terminated  or revoked sooner.        Influenza A by PCR NEGATIVE NEGATIVE Final   Influenza B by PCR NEGATIVE NEGATIVE Final    Comment: (NOTE) The Xpert Xpress SARS-CoV-2/FLU/RSV plus assay is intended as an aid in the diagnosis of influenza from Nasopharyngeal swab specimens and should not be used as a sole basis for treatment. Nasal washings and aspirates are unacceptable for Xpert Xpress SARS-CoV-2/FLU/RSV testing.  Fact Sheet for Patients: EntrepreneurPulse.com.au  Fact Sheet for Healthcare Providers: IncredibleEmployment.be  This test is not yet approved or cleared by the [place] FDA and has been authorized for detection and/or diagnosis of SARS-CoV-2 by FDA under an Emergency Use Authorization (EUA). This EUA will remain in effect (meaning this test can be used) for the duration of the COVID-19 declaration under Section 564(b)(1) of the Act, 21 U.S.C. section 360bbb-3(b)(1), unless the authorization is terminated or revoked.  Performed at [facility], [address] [place], [place] [address]   Blood culture (routine x 2)     Status: None (Preliminary result)   Collection Time: 08/08/22  4:47 PM   Specimen: BLOOD  Result Value Ref Range Status   Specimen Description   Final    BLOOD RIGHT ANTECUBITAL Performed at [facility], [address] [place], [place] [address]    Special Requests   Final    BOTTLES DRAWN AEROBIC AND ANAEROBIC Blood Culture adequate volume Performed at [facility], [address] [place], [place] [address]    Culture   Final    NO GROWTH 3 DAYS Performed at [facility], [place] [address] [place], [place] [address]    Report Status PENDING  Incomplete  Blood culture (routine x 2)     Status: None (Preliminary result)   Collection Time: 08/08/22  4:55 PM   Specimen: BLOOD RIGHT WRIST  Result Value Ref Range Status   Specimen Description   Final    BLOOD RIGHT WRIST Performed at [facility], [address] [place], [place] [address]    Special Requests   Final    BOTTLES DRAWN AEROBIC AND ANAEROBIC Blood Culture adequate volume Performed at [facility], [address] [place], [place] [address]    Culture   Final    NO GROWTH 3 DAYS Performed at [facility], [place] [address], [place], [place] [address]    Report Status PENDING  Incomplete  Gastrointestinal Panel by PCR , Stool     Status: None   Collection Time: 08/09/22 11:07 AM   Specimen: Stool  Result Value Ref Range Status   Campylobacter species NOT DETECTED NOT DETECTED Final   Plesimonas shigelloides NOT DETECTED NOT DETECTED Final   Salmonella species NOT DETECTED NOT DETECTED Final   Yersinia enterocolitica NOT DETECTED NOT DETECTED Final   Vibrio species NOT DETECTED NOT DETECTED Final   Vibrio cholerae NOT DETECTED NOT DETECTED Final   Enteroaggregative E coli (EAEC) NOT DETECTED NOT DETECTED Final   Enteropathogenic E coli (EPEC) NOT DETECTED NOT DETECTED Final   Enterotoxigenic E coli (ETEC) NOT DETECTED NOT DETECTED Final   Shiga like toxin producing E coli (STEC) NOT DETECTED NOT DETECTED Final   Shigella/Enteroinvasive E coli (EIEC) NOT DETECTED NOT DETECTED Final   Cryptosporidium NOT DETECTED NOT DETECTED Final   Cyclospora cayetanensis NOT DETECTED NOT DETECTED Final   Entamoeba histolytica NOT DETECTED NOT DETECTED Final   Giardia lamblia NOT DETECTED NOT DETECTED Final   Adenovirus F40/41 NOT DETECTED NOT DETECTED Final   Astrovirus NOT DETECTED NOT DETECTED Final   Norovirus GI/GII NOT DETECTED NOT DETECTED Final   Rotavirus A NOT DETECTED NOT DETECTED Final   Sapovirus (I, II, IV, and V) NOT DETECTED NOT DETECTED Final    Comment: Performed at [facility], [address] [place], [place] [address]  Respiratory (~20 pathogens) panel by PCR     Status: None   Collection Time: 08/09/22  1:17 PM   Specimen: Nasopharyngeal Swab; Respiratory  Result Value  Ref Range Status   Adenovirus NOT DETECTED NOT DETECTED Final   Coronavirus 229E NOT DETECTED NOT DETECTED Final    Comment: (NOTE) The Coronavirus on the Respiratory Panel, DOES NOT test for the novel  Coronavirus (2019 nCoV)    Coronavirus HKU1 NOT DETECTED NOT DETECTED Final   Coronavirus NL63 NOT DETECTED NOT DETECTED Final   Coronavirus OC43 NOT DETECTED NOT DETECTED Final   Metapneumovirus NOT DETECTED NOT DETECTED Final   Rhinovirus / Enterovirus NOT DETECTED NOT DETECTED Final   Influenza A NOT DETECTED NOT DETECTED Final   Influenza B NOT DETECTED NOT DETECTED Final   Parainfluenza Virus 1 NOT DETECTED NOT DETECTED Final   Parainfluenza Virus 2 NOT DETECTED NOT DETECTED Final   Parainfluenza Virus 3 NOT DETECTED NOT DETECTED Final   Parainfluenza Virus 4 NOT DETECTED NOT DETECTED Final   Respiratory Syncytial Virus NOT DETECTED NOT DETECTED Final   Bordetella pertussis NOT DETECTED NOT DETECTED Final   Bordetella Parapertussis NOT DETECTED NOT DETECTED Final   Chlamydophila pneumoniae NOT DETECTED NOT DETECTED Final   Mycoplasma pneumoniae NOT DETECTED NOT DETECTED Final    Comment: Performed at [facility], [place]. [address] [place], [place] [address]  Expectorated Sputum Assessment w Gram Stain, Rflx to Resp Cult     Status: None   Collection Time: 08/11/22  1:10 PM   Specimen: Sputum  Result Value Ref Range Status   Specimen Description  SPUTUM  Final   Special Requests NONE  Final   Sputum evaluation   Final    THIS SPECIMEN IS ACCEPTABLE FOR SPUTUM CULTURE Performed at [facility], [place] [address] [place], [place] [address]    Report Status 08/11/2022 FINAL  Final  Culture, Respiratory w Gram Stain     Status: None (Preliminary result)   Collection Time: 08/11/22  1:10 PM   Specimen: SPU  Result Value Ref Range Status   Specimen Description   Final    SPUTUM Performed at [place] [address] [place],  [place] [address]    Special Requests   Final    NONE Reflexed from [id number] Performed at [place] [address] [place], [place] [address]    Gram Stain   Final    RARE WBC PRESENT,BOTH PMN AND MONONUCLEAR FEW GRAM POSITIVE COCCI FEW GRAM NEGATIVE RODS FEW GRAM POSITIVE RODS    Culture   Final    CULTURE REINCUBATED FOR BETTER GROWTH Performed at [facility], [place] [address] [place], [place] [address]    Report Status PENDING  Incomplete  MRSA Next Gen by PCR, Nasal     Status: None   Collection Time: 08/11/22  7:16 PM   Specimen: Nasal Mucosa; Nasal Swab  Result Value Ref Range Status   MRSA by PCR Next Gen NOT DETECTED NOT DETECTED Final    Comment: (NOTE) The GeneXpert MRSA Assay (FDA approved for NASAL specimens only), is one component of a comprehensive MRSA colonization surveillance program. It is not intended to diagnose MRSA infection nor to guide or monitor treatment for MRSA infections. Test performance is not FDA approved in patients less than 42 years old. Performed at [facility], [place] [address] [place], [place] [address]      Labs: CBC: Recent Labs  Lab 08/09/22 [phone] 08/10/22 0337 08/11/22 0336 08/12/22 0351 08/13/22 0435  WBC 5.5 5.0 5.7 4.5 3.8*  NEUTROABS 4.3 4.0 4.7 3.3 2.3  HGB 14.4 13.6 13.4 12.0* 11.7*  HCT 42.0 38.7* 38.5* 34.4* 33.7*  MCV 92.9 91.9 91.4 89.8 90.8  PLT 162 151 146* 163 962    Basic Metabolic Panel: Recent Labs  Lab 08/09/22 0955 08/10/22 0337 08/11/22 0336 08/12/22 0351 08/13/22 0435  NA 133* 136 136 137 139  K 4.1 3.2* 3.8 3.3* 3.5  CL 101 105 101 103 105  CO2 _0 GLUCOSE 120* 114* 112* 110* 112*  BUN _1 CREATININE 1.49* 1.34* 1.21 1.15 1.02  CALCIUM 8.2* 7.9* 7.6* 7.8* 8.4*  MG 2.0 2.0 2.2 2.4 2.2    Liver Function Tests: Recent Labs  Lab 08/08/22 1646  AST 17  ALT 13  ALKPHOS 30*  BILITOT 0.7  PROT 6.9  ALBUMIN 3.5       Urinalysis    Component Value Date/Time   COLORURINE YELLOW 08/08/2022 2011   APPEARANCEUR CLEAR 08/08/2022 2011   LABSPEC >=1.030 08/08/2022 2011   PHURINE 6.0 08/08/2022 2011   GLUCOSEU NEGATIVE 08/08/2022 2011   GLUCOSEU NEGATIVE 08/09/2019 0739   HGBUR LARGE (A) 08/08/2022 2011   BILIRUBINUR SMALL (A) 08/08/2022 2011   BILIRUBINUR neg 05/31/2019 0820   KETONESUR 40 (A) 08/08/2022 2011   PROTEINUR >=300 (A) 08/08/2022 2011   UROBILINOGEN 0.2 08/09/2019 0739   NITRITE NEGATIVE 08/08/2022 2011   LEUKOCYTESUR NEGATIVE 08/08/2022 2011    Discussed in detail with patient's spouse on  his speaker phone just like yesterday.  Updated care and answered all questions.  Time coordinating discharge: 35 minutes  SIGNED:  Vernell Leep, MD,  Vernell Leep, MD,  FACP, [facility], [facility], [facility], [place]   To contact the attending provider between 7A-7P or the covering provider during after hours 7P-7A, please log into the web site [email or web address] and access using universal [place] password for that web site. If you do not have the password, please call the hospital operator.

## 2022-08-13 NOTE — Discharge Instructions (Signed)

## 2022-08-13 NOTE — Progress Notes (Signed)
  Transition of Care [facility]) Screening Note   Patient Details  Name: DAVIER TRAMELL Date of Birth: 03/01/1961   Transition of Care [facility]) CM/SW Contact:    Roseanne Kaufman, RN Phone Number: 08/13/2022, 9:36 AM    Transition of Care Department [facility]) has reviewed patient and no TOC needs have been identified at this time. We will continue to monitor patient advancement through interdisciplinary progression rounds. If new patient transition needs arise, please place a TOC consult.

## 2022-08-13 NOTE — Progress Notes (Signed)
Jimmy Day is still in the hospital.  Hopefully, he might be going home today.  I think he had a chest x-ray yesterday.  I am really not surprised that it still shows the right lower lobe infiltrate.  I do not think this will really resolve radiographically for another 3 weeks or so.  He is really not symptomatic.  He has had no shortness of breath.  He had cultures taken a couple days ago.  The results not yet back.  I do not think he had any fever.  He is still on Maxipime.  Maybe, the be able to switch him onto something oral that he can take at home.  His labs show white cell count 3.8.  Hemoglobin 9.7.  Platelet count under 86,000.  His sodium is 139.  Potassium 3.5.  BUN 14 creatinine 1.02.  Calcium 8.4.  His appetite is been quite good.  There is no diarrhea.  There is no nausea or vomiting.  He has had no rashes.  There is been no leg swelling.  He is using his incentive spirometer.  His vital signs show temperature of 98.  Pulse 77.  Blood pressure 146/87.  His lungs still show some crackles at the right base.  He has a good air movement bilaterally.  He has no wheezing.  Cardiac exam regular rate and rhythm with no murmurs, rubs or bruits.  Abdomen is soft.  He has good bowel sounds.  There is no fluid wave.  Extremity shows no clubbing, cyanosis or edema.  Neurological exam is nonfocal.  Hopefully, we will be able to get Jimmy Day home today.  Again if he need to be on antibiotics think he can be on oral antibiotics.  Maybe the sputum culture will come back today.  He really wants to go back to work.  He asked me when I thought he could go back to work.  I told him that he can probably go back on Monday from my perspective.  However, I told him that he really needs to check with the hospital doctor since he knows the lab results and radiographic studies on Jimmy Day much better than I do.  I am grateful for the incredible care that the staff on [address] is given him.  Lattie Haw,  MD  Michaelyn Barter 3:13

## 2022-08-14 ENCOUNTER — Telehealth: Payer: Self-pay

## 2022-08-14 LAB — CULTURE, BLOOD (ROUTINE X 2)
Culture: NO GROWTH
Culture: NO GROWTH
Special Requests: ADEQUATE
Special Requests: ADEQUATE

## 2022-08-14 LAB — CULTURE, RESPIRATORY W GRAM STAIN: Culture: NORMAL

## 2022-08-14 NOTE — Telephone Encounter (Signed)
Transition Care Management Follow-up Telephone Call Date of discharge and from where: 08/13/22, [place], Pneumonia  How have you been since you were released from the hospital? "I'm doing great!!" Any questions or concerns? No  Items Reviewed: Did the pt receive and understand the discharge instructions provided? No  Medications obtained and verified? Yes  Other? Losartan was discontinued. Pt advised to monitor BP daily and call with high results. To bring readings to HFU.  Any new allergies since your discharge? No  Dietary orders reviewed? No Do you have support at home? Yes   Home Care and Equipment/Supplies: Were home health services ordered? no If so, what is the name of the agency? NA  Has the agency set up a time to come to the patient's home? not applicable Were any new equipment or medical supplies ordered?  No What is the name of the medical supply agency? NA Were you able to get the supplies/equipment? not applicable Do you have any questions related to the use of the equipment or supplies? No  Functional Questionnaire: (I = Independent and D = Dependent) ADLs: I  Bathing/Dressing- I  Meal Prep- I  Eating- I  Maintaining continence- I  Transferring/Ambulation- I  Managing Meds- I  Follow up appointments reviewed:  PCP Hospital f/u appt confirmed? Yes  Scheduled to see PCP on Tues 9/26  @ 5:20p. [place] [facility] f/u appt confirmed? No   Are transportation arrangements needed? No  If their condition worsens, is the pt aware to call PCP or go to the Emergency Dept.? Yes Was the patient provided with contact information for the PCP's office or ED? Yes Was to pt encouraged to call back with questions or concerns? Yes

## 2022-08-18 ENCOUNTER — Telehealth: Payer: Self-pay | Admitting: Family

## 2022-08-18 ENCOUNTER — Other Ambulatory Visit (HOSPITAL_BASED_OUTPATIENT_CLINIC_OR_DEPARTMENT_OTHER): Payer: Self-pay

## 2022-08-18 ENCOUNTER — Encounter: Payer: Self-pay | Admitting: Family

## 2022-08-18 ENCOUNTER — Ambulatory Visit: Payer: 59 | Admitting: Family

## 2022-08-18 ENCOUNTER — Encounter: Payer: Self-pay | Admitting: Hematology & Oncology

## 2022-08-18 VITALS — BP 118/74 | HR 84 | Temp 98.2°F | Resp 16 | Wt 183.0 lb

## 2022-08-18 DIAGNOSIS — Z23 Encounter for immunization: Secondary | ICD-10-CM | POA: Diagnosis not present

## 2022-08-18 DIAGNOSIS — I10 Essential (primary) hypertension: Secondary | ICD-10-CM

## 2022-08-18 DIAGNOSIS — J189 Pneumonia, unspecified organism: Secondary | ICD-10-CM | POA: Diagnosis not present

## 2022-08-18 DIAGNOSIS — N1831 Chronic kidney disease, stage 3a: Secondary | ICD-10-CM | POA: Diagnosis not present

## 2022-08-18 DIAGNOSIS — K649 Unspecified hemorrhoids: Secondary | ICD-10-CM

## 2022-08-18 MED ORDER — LOSARTAN POTASSIUM 50 MG PO TABS: 50.0000 mg | ORAL_TABLET | Freq: Every day | ORAL | Status: DC

## 2022-08-18 NOTE — Telephone Encounter (Signed)
See mychart.  

## 2022-08-18 NOTE — Assessment & Plan Note (Signed)
Losartan was held during hospitalization but he recently restarted it at home due to rising blood pressure. Continue same.

## 2022-08-18 NOTE — Assessment & Plan Note (Signed)
Clinically stable. Lung exam is normal.  Oxygen saturation looks good.  He has had some minor diarrhea which may be due to the recent levaquin. He is advised to keep follow up visit as scheduled with pulmonology and cardiology.

## 2022-08-18 NOTE — Assessment & Plan Note (Signed)
Recommended sitz bath with epsom salts at least once daily. Topical preparation-H, Tucks pads.

## 2022-08-18 NOTE — Assessment & Plan Note (Signed)
Lab Results  Component Value Date   CREATININE 1.02 08/13/2022   Last Cr WNL.

## 2022-08-18 NOTE — Progress Notes (Signed)
Subjective:   By signing my name below, I, [name], attest that this documentation has been prepared under the direction and in the presence of [place], NP 08/18/2022.     Patient ID: Jimmy [place], male    DOB: [birth year], 61 y.o.   MRN: [id number]  Chief Complaint  Patient presents with   Hospitalization Follow-up    Here for follow up after hospitalization    HPI Patient is in today for a hospital follow up.   Hospital follow up: He reports he was in ED from 08/08/2022 to 08/13/2022. He reports that he was not feeling good 3 days before he went to the ED. He reports he had fatigue and low energy. He denies any cough. At the ED, he was prescribed 75 mg of Levaquin. He reports that his breathing is good currently, but he is still congested and his fatigue is persistent. He reports that he has diarrhea.  Hemorrhoids: He complains of external hemorrhoids.  Hypertension: He reports that he recorded his blood pressure around 08/14/2022 and it was 140/70, which is when he started his blood pressure medicine again. He is taking 50 mg Losartan for hypertension.  BP Readings from Last 3 Encounters:  08/18/22 118/74  08/13/22 (!) 146/87  07/22/22 123/75    Pulse Readings from Last 3 Encounters:  08/18/22 84  08/13/22 77  07/22/22 60    Immunization: He is interested in receiving the influenza vaccine during today's visit.   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   Pneumonia    during covid in 2020   Testosterone deficiency    Dr Hartley Barefoot    Past Surgical History:  Procedure Laterality Date   ANTERIOR CERVICAL DECOMP/DISCECTOMY FUSION N/A 04/30/2021   Procedure: Anterior  Cervical Decompression Fusion Cervical three-four, Cervical four-five, Cervical six-seven;  Surgeon: Vallarie Mare, MD;  Location: [facility];  Service: Neurosurgery;  Laterality: N/A;   CERVICAL PLATE INSERTION     POST COMPRESSION DISC INJURY   LIPOMAS REMOVAL     BENIGN FROM CHEST AND BACK    Family History  Problem Relation Age of Onset   Breast cancer Mother    Heart attack Father 67   Kidney disease Brother        RENAL FAILURE   Hypertension Brother        (died from sepsis at 20)   Heart attack Maternal Aunt 65       CABG   Stroke Maternal Uncle        CVA   Heart attack Paternal Grandmother    Diabetes Neg Hx    Colon cancer Neg Hx    Esophageal cancer Neg Hx    Rectal cancer Neg Hx    Stomach cancer Neg Hx    Colon polyps Neg Hx     Social History   Socioeconomic History   Marital status: Married    Spouse name: Not on file   Number of children: 3   Years of education: Not on file   Highest education level: Not on file  Occupational History   Occupation: Truck Geophysicist/field seismologist  Employer: [employer]  Tobacco Use   Smoking status: Never   Smokeless tobacco: Never   Tobacco comments:    never used tobacco  Vaping Use   Vaping Use: Never used  Substance and Sexual Activity   Alcohol use: Not Currently    Comment:  rarely   Drug use: No   Sexual activity: Yes    Birth control/protection: Condom  Other Topics Concern   Not on file  Social History Narrative   Son in [place]   Son in [place]   9 grandchildren (66 grandsons, 3 grandchildren)   Daughter at [employer] state   Married   Truck driver   Enjoys exercise, basketball, watch sports, work around the Magazine features editor in [place] Strain: Not on file  Food Insecurity: No [place] (08/09/2022)   Hunger Vital Sign    Worried About [place] in the Last Year: Never true    [place] in the Last Year: Never true   Transportation Needs: No Transportation Needs (08/09/2022)   PRAPARE - Hydrologist (Medical): No    Lack of Transportation (Non-Medical): No  Physical Activity: Not on file  Stress: Not on file  Social Connections: Not on file  Intimate Partner Violence: Not At Risk (08/09/2022)   Humiliation, Afraid, Rape, and Kick questionnaire    Fear of Current or Ex-Partner: No    Emotionally Abused: No    Physically Abused: No    Sexually Abused: No    Outpatient Medications Prior to Visit  Medication Sig Dispense Refill   Brimonidine Tartrate (LUMIFY) 0.025 % SOLN Place 1 drop into both eyes daily.     cholecalciferol (VITAMIN D3) 25 MCG (1000 UT) tablet Take 1 tablet (1,000 Units total) by mouth daily.     levofloxacin (LEVAQUIN) 750 MG tablet Take 1 tablet (750 mg total) by mouth daily for 5 days. 5 tablet 0   omega-3 acid ethyl esters (LOVAZA) 1 g capsule Take 2 capsules (2 g total) by mouth 2 (two) times daily. 120 capsule 0   tadalafil (CIALIS) 5 MG tablet Take 1 tablet by mouth everyday 90 tablet 3   tamsulosin (FLOMAX) 0.4 MG CAPS capsule Take 1 CAPSULE by mouth ONCE DAILY (Patient taking differently: Take 0.4 mg by mouth daily.) 30 capsule 10   No facility-administered medications prior to visit.    No Known Allergies  Review of Systems  Gastrointestinal:  Positive for diarrhea.  Genitourinary:        (+) External Hemorrhoids       Objective:    Physical Exam Constitutional:      General: He is not in acute distress.    Appearance: Normal appearance. He is not ill-appearing.  HENT:     Head: Normocephalic and atraumatic.     Right Ear: External ear normal.     Left Ear: External ear normal.  Eyes:     Extraocular Movements: Extraocular movements intact.     Pupils: Pupils are equal, round, and reactive to light.  Cardiovascular:     Rate and Rhythm: Normal rate and regular rhythm.     Heart sounds: Normal heart sounds. No murmur heard.     No gallop.  Pulmonary:     Effort: No respiratory distress.     Breath sounds: Normal breath sounds. No wheezing or rales.  Skin:    General: Skin is warm  and dry.  Neurological:     Mental Status: He is alert and oriented to person, place, and time.  Psychiatric:        Judgment: Judgment normal.     BP 118/74 (BP Location: Right Arm, Patient Position: Sitting, Cuff Size: Small)   Pulse 84   Temp 98.2 F (36.8 C) (Oral)   Resp 16   Wt 183 lb (83 kg)   SpO2 98%   BMI 24.82 kg/m  Wt Readings from Last 3 Encounters:  08/18/22 183 lb (83 kg)  08/09/22 182 lb 12.2 oz (82.9 kg)  07/22/22 191 lb 12.8 oz (87 kg)       Assessment & Plan:   Problem List Items Addressed This Visit       Unprioritized   HTN (hypertension) (Chronic)    Losartan was held during hospitalization but he recently restarted it at home due to rising blood pressure. Continue same.       Relevant Medications   losartan (COZAAR) 50 MG tablet   Other Relevant Orders   Comp Met (CMET)   Hemorrhoids    Recommended sitz bath with epsom salts at least once daily. Topical preparation-H, Tucks pads.       Relevant Medications   losartan (COZAAR) 50 MG tablet   Chronic kidney disease, stage 3a (HCC)    Lab Results  Component Value Date   CREATININE 1.02 08/13/2022  Last Cr WNL.       CAP (community acquired pneumonia)    Clinically stable. Lung exam is normal.  Oxygen saturation looks good.  He has had some minor diarrhea which may be due to the recent levaquin. He is advised to keep follow up visit as scheduled with pulmonology and cardiology.       Other Visit Diagnoses     Needs flu shot    -  Primary   Relevant Orders   Flu Vaccine QUAD 6+ mos PF IM (Fluarix Quad PF) (Completed)        Meds ordered this encounter  Medications   losartan (COZAAR) 50 MG tablet    Sig: Take 1 tablet (50 mg total) by mouth daily.    Dispense:  90 tablet    Order Specific Question:   Supervising Provider     Answer:   Penni Homans A [[id number]]   30 minutes spent on today's visit. Time was spent reviewing hospital records, interviewing patient and formulating medical plan.   I, Nance Pear, NP, personally preformed the services described in this documentation.  All medical record entries made by the scribe were at my direction and in my presence.  I have reviewed the chart and discharge instructions (if applicable) and agree that the record reflects my personal performance and is accurate and complete. 08/18/2022.   I,Amber Collins,acting as a Education administrator for [employer], NP.,have documented all relevant documentation on the behalf of Nance Pear, NP,as directed by  Nance Pear, NP while in the presence of Nance Pear, NP.    Nance Pear, NP

## 2022-08-19 LAB — COMPREHENSIVE METABOLIC PANEL
ALT: 34 U/L (ref 0–53)
AST: 13 U/L (ref 0–37)
Albumin: 3.7 g/dL (ref 3.5–5.2)
Alkaline Phosphatase: 63 U/L (ref 39–117)
BUN: 15 mg/dL (ref 6–23)
CO2: 30 mEq/L (ref 19–32)
Calcium: 9.1 mg/dL (ref 8.4–10.5)
Chloride: 103 mEq/L (ref 96–112)
Creatinine, Ser: 1.29 mg/dL (ref 0.40–1.50)
GFR: 59.84 mL/min — ABNORMAL LOW (ref >60.00)
Glucose, Bld: 87 mg/dL (ref 70–99)
Potassium: 4.5 mEq/L (ref 3.5–5.1)
Sodium: 139 mEq/L (ref 135–145)
Total Bilirubin: 0.4 mg/dL (ref 0.2–1.2)
Total Protein: 6.6 g/dL (ref 6.0–8.3)

## 2022-08-25 ENCOUNTER — Other Ambulatory Visit: Payer: Self-pay | Admitting: Hematology & Oncology

## 2022-08-27 ENCOUNTER — Ambulatory Visit: Payer: 59 | Attending: Cardiology | Admitting: Cardiology

## 2022-08-27 ENCOUNTER — Encounter: Payer: Self-pay | Admitting: Hematology & Oncology

## 2022-08-27 ENCOUNTER — Other Ambulatory Visit (HOSPITAL_BASED_OUTPATIENT_CLINIC_OR_DEPARTMENT_OTHER): Payer: Self-pay

## 2022-08-27 ENCOUNTER — Encounter: Payer: Self-pay | Admitting: Cardiology

## 2022-08-27 VITALS — BP 126/80 | HR 81 | Ht 72.0 in | Wt 181.0 lb

## 2022-08-27 DIAGNOSIS — C9000 Multiple myeloma not having achieved remission: Secondary | ICD-10-CM | POA: Diagnosis not present

## 2022-08-27 DIAGNOSIS — Z0000 Encounter for general adult medical examination without abnormal findings: Secondary | ICD-10-CM

## 2022-08-27 DIAGNOSIS — Z8249 Family history of ischemic heart disease and other diseases of the circulatory system: Secondary | ICD-10-CM

## 2022-08-27 DIAGNOSIS — I10 Essential (primary) hypertension: Secondary | ICD-10-CM | POA: Diagnosis not present

## 2022-08-27 DIAGNOSIS — Z9481 Bone marrow transplant status: Secondary | ICD-10-CM

## 2022-08-27 DIAGNOSIS — R0609 Other forms of dyspnea: Secondary | ICD-10-CM

## 2022-08-27 NOTE — Progress Notes (Signed)
Cardiology Consultation:    Date:  08/27/2022   ID:  Jimmy Day, DOB 1961-04-09, MRN [id number]  PCP:  [name], NP  Cardiologist:  Jenne Campus, MD   Referring MD: [name], NP   Chief Complaint  Patient presents with   [place] h/x CAD    History of Present Illness:    Jimmy Day is a 61 y.o. male who is being seen today for the evaluation of risk factors assessment at the request of [name], NP.  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  He does have essential hypertension as well as dyslipidemia with LDL of 101.  He exercised on the regular basis, he is being active all his life.  He goes to gym and exercise with no difficulties he actually tells me that he feels he is feeling stronger now than before.  He does not smoke, never did drink alcohol very occasionally socially does have multiple family members with coronary disease including premature.  His father died at the age of 71 because of massive heart attack.  Past Medical History:  Diagnosis Date   Arthritis    spine   Elevated PSA 11/29/2017   Fasting hyperglycemia    NORMAL A1c   History of COVID-19 2020   Hyperlipidemia    Hypertension    Kappa light chain myeloma ([place]) 02/02/2014   Pneumonia    during covid in 2020   Testosterone deficiency    Dr Hartley Barefoot    Past Surgical History:  Procedure Laterality Date   ANTERIOR CERVICAL DECOMP/DISCECTOMY FUSION N/A 04/30/2021   Procedure: Anterior Cervical Decompression Fusion Cervical three-four, Cervical four-five, Cervical six-seven;  Surgeon: Vallarie Mare, MD;  Location: [facility];  Service: Neurosurgery;  Laterality: N/A;   CERVICAL PLATE INSERTION     POST COMPRESSION DISC INJURY    LIPOMAS REMOVAL     BENIGN FROM CHEST AND BACK    Current Medications: Current Meds  Medication Sig   Brimonidine Tartrate (LUMIFY) 0.025 % SOLN Place 1 drop into both eyes daily.   cholecalciferol (VITAMIN D3) 25 MCG (1000 UT) tablet Take 1 tablet (1,000 Units total) by mouth daily.   losartan (COZAAR) 50 MG tablet Take 1 tablet (50 mg total) by mouth daily.   omega-3 acid ethyl esters (LOVAZA) 1 g capsule Take 2 capsules (2 g total) by mouth 2 (two) times daily.   REVLIMID 10 MG capsule TAKE 1 CAPSULE ONCE DAILY FOR 21 DAYS, THEN 7 DAYS OFF (Patient taking differently: Take 10 mg by mouth daily. 7 days)   tadalafil (CIALIS) 5 MG tablet Take 1 tablet by mouth everyday (Patient taking differently: Take 5 mg by mouth daily as needed for erectile dysfunction.)   tamsulosin (FLOMAX) 0.4 MG CAPS capsule Take 1 CAPSULE by mouth ONCE DAILY (Patient taking differently: Take 0.4 mg by mouth daily.)     Allergies:   Patient has no known allergies.   Social History   Socioeconomic History   Marital status: Married    Spouse name: Not on file   Number of children: 3   Years of education: Not on file   Highest education level: Not on file  Occupational History   Occupation: Truck Geophysicist/field seismologist  Employer: [employer]  Tobacco Use   Smoking status: Never   Smokeless tobacco: Never   Tobacco comments:    never used tobacco  Vaping Use   Vaping Use: Never used  Substance and Sexual Activity   Alcohol use: Not Currently    Comment:  rarely   Drug use: No   Sexual activity: Yes    Birth control/protection: Condom  Other Topics Concern   Not on file  Social History Narrative   Son in [place]   Son in [place]   9 grandchildren (41 grandsons, 3 grandchildren)   Daughter at [employer] state   Married   Truck driver   Enjoys exercise, basketball, watch sports, work around the Magazine features editor in [place] Strain: Not on file   Food Insecurity: No [place] (08/09/2022)   Hunger Vital Sign    Worried About [place] in the Last Year: Never true    [place] in the Last Year: Never true  Transportation Needs: No Transportation Needs (08/09/2022)   PRAPARE - Hydrologist (Medical): No    Lack of Transportation (Non-Medical): No  Physical Activity: Not on file  Stress: Not on file  Social Connections: Not on file     Family History: The patient's family history includes Breast cancer in his mother; Heart attack in his paternal grandmother; Heart attack (age of onset: 49) in his father; Heart attack (age of onset: 87) in his maternal aunt; Hypertension in his brother; Kidney disease in his brother; Stroke in his maternal uncle. There is no history of Diabetes, Colon cancer, Esophageal cancer, Rectal cancer, Stomach cancer, or Colon polyps. ROS:   Please see the history of present illness.    All 14 point review of systems negative except as described per history of present illness.  EKGs/Labs/Other Studies Reviewed:    The following studies were reviewed today:   EKG:  EKG is  ordered today.  The ekg ordered today demonstrates normal sinus rhythm, normal P interval, nonspecific ST-T segment changes  Recent Labs: 08/13/2022: Hemoglobin 11.7; Magnesium 2.2; Platelets 186 08/18/2022: ALT 34; BUN 15; Creatinine, Ser 1.29; Potassium 4.5; Sodium 139  Recent Lipid Panel    Component Value Date/Time   CHOL 167 06/26/2022 1028   TRIG 102.0 06/26/2022 1028   HDL 45.50 06/26/2022 1028   CHOLHDL 4 06/26/2022 1028   VLDL 20.4 06/26/2022 1028   LDLCALC 101 (H) 06/26/2022 1028   LDLCALC 82 07/31/2020 0726   LDLDIRECT 93.0 11/26/2017 0852    Physical Exam:    VS:  BP 126/80 (BP Location: Left Arm, Patient Position: Sitting)   Pulse 81   Ht 6' (1.829 m)   Wt 181 lb (82.1 kg)   SpO2 98%   BMI 24.55 kg/m     Wt Readings from Last 3 Encounters:  08/27/22 181 lb  (82.1 kg)  08/18/22 183 lb (83 kg)  08/09/22 182 lb 12.2 oz (82.9 kg)     GEN:  Well nourished, well developed in no acute distress HEENT: Normal NECK: No JVD; No carotid bruits LYMPHATICS: No lymphadenopathy CARDIAC: RRR, no murmurs, no rubs, no gallops RESPIRATORY:  Clear to auscultation without rales, wheezing or rhonchi  ABDOMEN: Soft, non-tender, non-distended MUSCULOSKELETAL:  No edema; No deformity  SKIN: Warm and dry NEUROLOGIC:  Alert and oriented x 3 PSYCHIATRIC:  Normal affect   ASSESSMENT:  1. Primary hypertension   2. Preventative health care   3. Family history of early CAD   68. Kappa light chain myeloma (HCC)   5. Status post bone marrow transplant ([facility])    PLAN:    In order of problems listed above:  Preventive health care for coronary artery disease.  We did discuss in length about the need to exercise which he rated as we discussed basic of good healthy heart healthy diet.  I will ask him to have a calcium score done to see if he gets any calcification to determine if he needs any cholesterol medication.  As a part of evaluation I will schedule him to have an echocardiogram done this is because of dyspnea on exertion to clarify make sure his left ventricle ejection fraction is preserved Status post bone marrow transplant.  Follow-up excellent like always by his oncologist. History of multiple myeloma.  Stable. Dyslipidemia I did review K PN which show me his LDL of 101 HDL 45.  Again we will get calcium score to determine if he need to have therapy with medications for it   Medication Adjustments/Labs and Tests Ordered: Current medicines are reviewed at length with the patient today.  Concerns regarding medicines are outlined above.  No orders of the defined types were placed in this encounter.  No orders of the defined types were placed in this encounter.   Signed, Park Liter, MD, [facility]. 08/27/2022 3:33 PM    [facility]  [facility]

## 2022-08-27 NOTE — Patient Instructions (Addendum)
Medication Instructions:  Your physician recommends that you continue on your current medications as directed. Please refer to the Current Medication list given to you today.  *If you need a refill on your cardiac medications before your next appointment, please call your pharmacy*   Lab Work: None Ordered If you have labs (blood work) drawn today and your tests are completely normal, you will receive your results only by: [place] (if you have MyChart) OR A paper copy in the mail If you have any lab test that is abnormal or we need to change your treatment, we will call you to review the results.   Testing/Procedures: Your physician has requested that you have an echocardiogram. Echocardiography is a painless test that uses sound waves to create images of your heart. It provides your doctor with information about the size and shape of your heart and how well your heart's chambers and valves are working. This procedure takes approximately one hour. There are no restrictions for this procedure.   We will order CT coronary calcium score. It will cost $99.00 and is not covered by insurance.  Please call to schedule.    [facility] [address] [place], [place] [address] [phone]  - Call to schedule   Follow-Up: At [facility], you and your health needs are our priority.  As part of our continuing mission to provide you with exceptional heart care, we have created designated Provider Care Teams.  These Care Teams include your primary Cardiologist (physician) and Advanced Practice Providers (APPs -  Physician Assistants and Nurse Practitioners) who all work together to provide you with the care you need, when you need it.  We recommend signing up for the patient portal called "MyChart".  Sign up information is provided on this After Visit Summary.  MyChart is used to connect with patients for Virtual Visits (Telemedicine).  Patients are able to view lab/test  results, encounter notes, upcoming appointments, etc.  Non-urgent messages can be sent to your provider as well.   To learn more about what you can do with MyChart, go to [email or web address].    Your next appointment:   6 month(s)  The format for your next appointment:   In Person  Provider:   Jenne Campus, MD    Other Instructions NA

## 2022-08-28 ENCOUNTER — Other Ambulatory Visit: Payer: Self-pay

## 2022-08-28 ENCOUNTER — Ambulatory Visit (INDEPENDENT_AMBULATORY_CARE_PROVIDER_SITE_OTHER): Payer: 59

## 2022-08-28 DIAGNOSIS — E538 Deficiency of other specified B group vitamins: Secondary | ICD-10-CM

## 2022-08-28 DIAGNOSIS — C9000 Multiple myeloma not having achieved remission: Secondary | ICD-10-CM

## 2022-08-28 MED ORDER — LENALIDOMIDE 10 MG PO CAPS: 10.0000 mg | ORAL_CAPSULE | Freq: Every day | ORAL | 12 refills | Status: DC

## 2022-08-28 MED ORDER — CYANOCOBALAMIN 1000 MCG/ML IJ SOLN
1000.0000 ug | Freq: Once | INTRAMUSCULAR | Status: AC
  Administered 2022-08-28: 1000 ug via INTRAMUSCULAR

## 2022-08-28 NOTE — Progress Notes (Signed)
Pt here for monthly B12 injection per Melissa   B12 1000mcg given L deltoid IM, and pt tolerated injection well.   Next B12 injection scheduled for 1 month.  

## 2022-09-08 ENCOUNTER — Ambulatory Visit (HOSPITAL_BASED_OUTPATIENT_CLINIC_OR_DEPARTMENT_OTHER)
Admission: RE | Admit: 2022-09-08 | Discharge: 2022-09-08 | Disposition: A | Discharge status: Home / Self Care | Payer: 59 | Source: Ambulatory Visit | Attending: Cardiology | Admitting: Cardiology

## 2022-09-08 DIAGNOSIS — R0609 Other forms of dyspnea: Secondary | ICD-10-CM | POA: Diagnosis present

## 2022-09-08 DIAGNOSIS — Z8249 Family history of ischemic heart disease and other diseases of the circulatory system: Secondary | ICD-10-CM | POA: Insufficient documentation

## 2022-09-08 LAB — ECHOCARDIOGRAM COMPLETE
AR max vel: 2.7 cm2
AV Area VTI: 2.65 cm2
AV Area mean vel: 2.66 cm2
AV Mean grad: 4 mmHg
AV Peak grad: 7 mmHg
Ao pk vel: 1.32 m/s
Area-P 1/2: 3.1 cm2
S' Lateral: 3.3 cm

## 2022-09-08 NOTE — Progress Notes (Signed)
  Echocardiogram 2D Echocardiogram has been performed.  Jimmy Day F 09/08/2022, 4:05 PM

## 2022-09-10 ENCOUNTER — Other Ambulatory Visit: Payer: Self-pay

## 2022-09-10 ENCOUNTER — Emergency Department (HOSPITAL_COMMUNITY): Payer: 59

## 2022-09-10 ENCOUNTER — Encounter (HOSPITAL_COMMUNITY): Payer: Self-pay

## 2022-09-10 ENCOUNTER — Observation Stay (HOSPITAL_COMMUNITY)
Admission: EM | Admit: 2022-09-10 | Discharge: 2022-09-11 | Disposition: A | Discharge status: Home / Self Care | Payer: 59 | Attending: Internal Medicine | Admitting: Internal Medicine

## 2022-09-10 DIAGNOSIS — I129 Hypertensive chronic kidney disease with stage 1 through stage 4 chronic kidney disease, or unspecified chronic kidney disease: Secondary | ICD-10-CM | POA: Insufficient documentation

## 2022-09-10 DIAGNOSIS — I10 Essential (primary) hypertension: Secondary | ICD-10-CM | POA: Diagnosis not present

## 2022-09-10 DIAGNOSIS — R197 Diarrhea, unspecified: Secondary | ICD-10-CM | POA: Insufficient documentation

## 2022-09-10 DIAGNOSIS — R55 Syncope and collapse: Principal | ICD-10-CM | POA: Diagnosis present

## 2022-09-10 DIAGNOSIS — N289 Disorder of kidney and ureter, unspecified: Secondary | ICD-10-CM | POA: Insufficient documentation

## 2022-09-10 DIAGNOSIS — Z52011 Autologous donor, stem cells: Secondary | ICD-10-CM | POA: Diagnosis present

## 2022-09-10 DIAGNOSIS — I2510 Atherosclerotic heart disease of native coronary artery without angina pectoris: Secondary | ICD-10-CM | POA: Diagnosis not present

## 2022-09-10 DIAGNOSIS — R112 Nausea with vomiting, unspecified: Secondary | ICD-10-CM | POA: Diagnosis present

## 2022-09-10 DIAGNOSIS — C9000 Multiple myeloma not having achieved remission: Secondary | ICD-10-CM | POA: Diagnosis present

## 2022-09-10 DIAGNOSIS — Z8616 Personal history of COVID-19: Secondary | ICD-10-CM | POA: Diagnosis not present

## 2022-09-10 DIAGNOSIS — D84821 Immunodeficiency due to drugs: Secondary | ICD-10-CM

## 2022-09-10 DIAGNOSIS — J189 Pneumonia, unspecified organism: Secondary | ICD-10-CM | POA: Diagnosis present

## 2022-09-10 DIAGNOSIS — N182 Chronic kidney disease, stage 2 (mild): Secondary | ICD-10-CM | POA: Insufficient documentation

## 2022-09-10 DIAGNOSIS — Z79899 Other long term (current) drug therapy: Secondary | ICD-10-CM | POA: Diagnosis not present

## 2022-09-10 LAB — I-STAT CHEM 8, ED
BUN: 17 mg/dL (ref 8–23)
Calcium, Ion: 1.11 mmol/L — ABNORMAL LOW (ref 1.15–1.40)
Chloride: 106 mmol/L (ref 98–111)
Creatinine, Ser: 1.4 mg/dL — ABNORMAL HIGH (ref 0.61–1.24)
Glucose, Bld: 100 mg/dL — ABNORMAL HIGH (ref 70–99)
HCT: 46 % (ref 39.0–52.0)
Hemoglobin: 15.6 g/dL (ref 13.0–17.0)
Potassium: 4.2 mmol/L (ref 3.5–5.1)
Sodium: 139 mmol/L (ref 135–145)
TCO2: 25 mmol/L (ref 22–32)

## 2022-09-10 LAB — URINALYSIS, ROUTINE W REFLEX MICROSCOPIC
Bilirubin Urine: NEGATIVE
Glucose, UA: NEGATIVE mg/dL
Hgb urine dipstick: NEGATIVE
Ketones, ur: NEGATIVE mg/dL
Leukocytes,Ua: NEGATIVE
Nitrite: NEGATIVE
Protein, ur: NEGATIVE mg/dL
Specific Gravity, Urine: 1.012 (ref 1.005–1.030)
pH: 5 (ref 5.0–8.0)

## 2022-09-10 LAB — CBC WITH DIFFERENTIAL/PLATELET
Abs Immature Granulocytes: 0.02 10*3/uL (ref 0.00–0.07)
Basophils Absolute: 0 10*3/uL (ref 0.0–0.1)
Basophils Relative: 0 %
Eosinophils Absolute: 0.1 10*3/uL (ref 0.0–0.5)
Eosinophils Relative: 1 %
HCT: 41.7 % (ref 39.0–52.0)
Hemoglobin: 14.3 g/dL (ref 13.0–17.0)
Immature Granulocytes: 1 %
Lymphocytes Relative: 4 %
Lymphs Abs: 0.2 10*3/uL — ABNORMAL LOW (ref 0.7–4.0)
MCH: 32.6 pg (ref 26.0–34.0)
MCHC: 34.3 g/dL (ref 30.0–36.0)
MCV: 95 fL (ref 80.0–100.0)
Monocytes Absolute: 0.6 10*3/uL (ref 0.1–1.0)
Monocytes Relative: 13 %
Neutro Abs: 3.3 10*3/uL (ref 1.7–7.7)
Neutrophils Relative %: 81 %
Platelets: 160 10*3/uL (ref 150–400)
RBC: 4.39 MIL/uL (ref 4.22–5.81)
RDW: 14.7 % (ref 11.5–15.5)
WBC: 4.2 10*3/uL (ref 4.0–10.5)
nRBC: 0 % (ref 0.0–0.2)

## 2022-09-10 LAB — COMPREHENSIVE METABOLIC PANEL
ALT: 28 U/L (ref 0–44)
AST: 22 U/L (ref 15–41)
Albumin: 3.7 g/dL (ref 3.5–5.0)
Alkaline Phosphatase: 31 U/L — ABNORMAL LOW (ref 38–126)
Anion gap: 8 (ref 5–15)
BUN: 13 mg/dL (ref 8–23)
CO2: 22 mmol/L (ref 22–32)
Calcium: 9 mg/dL (ref 8.9–10.3)
Chloride: 108 mmol/L (ref 98–111)
Creatinine, Ser: 1.42 mg/dL — ABNORMAL HIGH (ref 0.61–1.24)
GFR, Estimated: 56 mL/min — ABNORMAL LOW (ref >60)
Glucose, Bld: 107 mg/dL — ABNORMAL HIGH (ref 70–99)
Potassium: 4 mmol/L (ref 3.5–5.1)
Sodium: 138 mmol/L (ref 135–145)
Total Bilirubin: 0.8 mg/dL (ref 0.3–1.2)
Total Protein: 6.5 g/dL (ref 6.5–8.1)

## 2022-09-10 LAB — LACTIC ACID, PLASMA
Lactic Acid, Venous: 1.1 mmol/L (ref 0.5–1.9)
Lactic Acid, Venous: 1.7 mmol/L (ref 0.5–1.9)

## 2022-09-10 LAB — I-STAT VENOUS BLOOD GAS, ED
Acid-base deficit: 1 mmol/L (ref 0.0–2.0)
Bicarbonate: 25.8 mmol/L (ref 20.0–28.0)
Calcium, Ion: 1.16 mmol/L (ref 1.15–1.40)
HCT: 44 % (ref 39.0–52.0)
Hemoglobin: 15 g/dL (ref 13.0–17.0)
O2 Saturation: 81 %
Potassium: 4.2 mmol/L (ref 3.5–5.1)
Sodium: 139 mmol/L (ref 135–145)
TCO2: 27 mmol/L (ref 22–32)
pCO2, Ven: 48.9 mmHg (ref 44–60)
pH, Ven: 7.33 (ref 7.25–7.43)
pO2, Ven: 49 mmHg — ABNORMAL HIGH (ref 32–45)

## 2022-09-10 LAB — C DIFFICILE QUICK SCREEN W PCR REFLEX
C Diff antigen: NEGATIVE
C Diff interpretation: NOT DETECTED
C Diff toxin: NEGATIVE

## 2022-09-10 LAB — LIPASE, BLOOD: Lipase: 49 U/L (ref 11–51)

## 2022-09-10 LAB — TROPONIN I (HIGH SENSITIVITY)
Troponin I (High Sensitivity): 8 ng/L (ref <18)
Troponin I (High Sensitivity): 8 ng/L (ref <18)

## 2022-09-10 MED ORDER — ALBUTEROL SULFATE (2.5 MG/3ML) 0.083% IN NEBU: 2.5000 mg | INHALATION_SOLUTION | Freq: Four times a day (QID) | RESPIRATORY_TRACT | Status: DC | PRN

## 2022-09-10 MED ORDER — LEVOFLOXACIN IN D5W 750 MG/150ML IV SOLN
750.0000 mg | Freq: Once | INTRAVENOUS | Status: AC
  Administered 2022-09-10: 750 mg via INTRAVENOUS
  Filled 2022-09-10: qty 150

## 2022-09-10 MED ORDER — PANTOPRAZOLE SODIUM 40 MG PO TBEC
40.0000 mg | DELAYED_RELEASE_TABLET | Freq: Every day | ORAL | Status: DC
  Administered 2022-09-11: 40 mg via ORAL
  Filled 2022-09-10: qty 1

## 2022-09-10 MED ORDER — SODIUM CHLORIDE 0.9 % IV BOLUS
1000.0000 mL | Freq: Once | INTRAVENOUS | Status: AC
  Administered 2022-09-10: 1000 mL via INTRAVENOUS

## 2022-09-10 MED ORDER — HYDRALAZINE HCL 20 MG/ML IJ SOLN: 10.0000 mg | INTRAMUSCULAR | Status: DC | PRN

## 2022-09-10 MED ORDER — ACETAMINOPHEN 650 MG RE SUPP: 650.0000 mg | Freq: Four times a day (QID) | RECTAL | Status: DC | PRN

## 2022-09-10 MED ORDER — SACCHAROMYCES BOULARDII 250 MG PO CAPS
250.0000 mg | ORAL_CAPSULE | Freq: Two times a day (BID) | ORAL | Status: DC
  Administered 2022-09-10 – 2022-09-11 (×3): 250 mg via ORAL
  Filled 2022-09-10 (×4): qty 1

## 2022-09-10 MED ORDER — ONDANSETRON HCL 4 MG/2ML IJ SOLN: 4.0000 mg | Freq: Four times a day (QID) | INTRAMUSCULAR | Status: DC | PRN

## 2022-09-10 MED ORDER — ACETAMINOPHEN 325 MG PO TABS: 650.0000 mg | ORAL_TABLET | Freq: Four times a day (QID) | ORAL | Status: DC | PRN

## 2022-09-10 MED ORDER — PANTOPRAZOLE SODIUM 40 MG IV SOLR
40.0000 mg | Freq: Once | INTRAVENOUS | Status: AC
  Administered 2022-09-10: 40 mg via INTRAVENOUS
  Filled 2022-09-10: qty 10

## 2022-09-10 MED ORDER — ENOXAPARIN SODIUM 40 MG/0.4ML IJ SOSY
40.0000 mg | PREFILLED_SYRINGE | Freq: Every day | INTRAMUSCULAR | Status: DC
  Administered 2022-09-10: 40 mg via SUBCUTANEOUS
  Filled 2022-09-10 (×2): qty 0.4

## 2022-09-10 MED ORDER — CALCIUM GLUCONATE-NACL 1-0.675 GM/50ML-% IV SOLN
1.0000 g | Freq: Once | INTRAVENOUS | Status: AC
  Administered 2022-09-10: 1000 mg via INTRAVENOUS
  Filled 2022-09-10: qty 50

## 2022-09-10 MED ORDER — ONDANSETRON HCL 4 MG PO TABS: 4.0000 mg | ORAL_TABLET | Freq: Four times a day (QID) | ORAL | Status: DC | PRN

## 2022-09-10 MED ORDER — IOHEXOL 350 MG/ML SOLN
50.0000 mL | Freq: Once | INTRAVENOUS | Status: AC | PRN
  Administered 2022-09-10: 50 mL via INTRAVENOUS

## 2022-09-10 MED ORDER — SODIUM CHLORIDE 0.9% FLUSH
3.0000 mL | Freq: Two times a day (BID) | INTRAVENOUS | Status: DC
  Administered 2022-09-10 – 2022-09-11 (×3): 3 mL via INTRAVENOUS

## 2022-09-10 MED ORDER — SODIUM CHLORIDE 0.9 % IV SOLN: INTRAVENOUS | Status: AC

## 2022-09-10 MED ORDER — ONDANSETRON HCL 4 MG/2ML IJ SOLN
4.0000 mg | Freq: Once | INTRAMUSCULAR | Status: AC
  Administered 2022-09-10: 4 mg via INTRAVENOUS
  Filled 2022-09-10: qty 2

## 2022-09-10 MED ORDER — LEVOFLOXACIN IN D5W 750 MG/150ML IV SOLN
750.0000 mg | INTRAVENOUS | Status: DC
  Filled 2022-09-10: qty 150

## 2022-09-10 NOTE — ED Triage Notes (Signed)
Per EMS pt had a syncopal episode tonight at home.EMS reports pt's BP was elevated upon arrival and pt not responding appropriately.

## 2022-09-10 NOTE — H&P (Addendum)
History and Physical    Patient: Jimmy Day KGM:[id number] DOB: 26-May-1961 DOA: 09/10/2022 DOS: the patient was seen and examined on 09/10/2022 PCP: [name], NP  Patient coming from: Home via EMS  Chief Complaint:  Chief Complaint  Patient presents with   Loss of Consciousness   HPI: Jimmy Day is a 61 y.o. male with medical history significant of hypertension, hyperlipidemia, CAD, CKD stage II, multiple myeloma s/p bone marrow transplant on Revlimid, and arthritis who presents after passing out this morning.  History is obtained from the patient with assistance of his wife present at bedside.  He had woken up this morning and had 7-8 episodes of diarrhea.  Stools were initially solid, but turned to liquid.  He normally drinks beet juice and states that his stools are red in color, but denies any gross blood.  Patient reported feeling nauseated with abdominal pressure for which his wife had given him some Kaopectate. Within an hour he developed dry heaves for which he had gone into the bathroom and was bent down over the commode.  His wife heard him fall and when she came to the bathroom where she found him minimally responsive.  Denies having any fevers, headache, chest pain, or dysuria symptoms.  He had eaten raw oysters yesterday evening or the night before possibly.   Last hospitalized in September from 9/16 -9/21 for community-acquired pneumonia initially treated with IV antibiotics of Rocephin and azithromycin x3 days, but spiking fevers up to 103.1 F for which he was switched to vancomycin and cefepime.  Patient was discharged home to complete 5 days of Levaquin.  Revlimid had been discontinued during last hospitalization and never restarted. Since that time patient reports that he still felt very fatigued and he is short of breath with his workouts.  He had just had a coronary CT on 10/5 due to family history of heart disease for which he was advised he had no significant  cardiac disease.  upon admission to the emergency department patient was noted to be afebrile with tachycardia, tachypnea, and all other vital signs maintained.  Labs significant for creatinine 1.4.  CBC, high-sensitivity troponins, lactic acid, and the rest of labs or relatively within normal limits.  CT skin of the head did not note any acute abnormality CT angiogram of the chest/abd/pelvis noted Persistent unchanged right lower lobe consolidation consistent with pneumonia and liquid stool within the distal sigmoid colon and rectum consistent with diarrhea.  Due to the patient reporting eating raw oysters stool studies were ordered and patient was started on Levaquin for possible vibrio infection.  Patient also received Zofran and 1 L normal saline IV fluids.  Review of Systems: As mentioned in the history of present illness. All other systems reviewed and are negative. Past Medical History:  Diagnosis Date   Arthritis    spine   Elevated PSA 11/29/2017   Fasting hyperglycemia    NORMAL A1c   History of COVID-19 2020   Hyperlipidemia    Hypertension    Kappa light chain myeloma ([place]) 02/02/2014   Pneumonia    during covid in 2020   Testosterone deficiency    Dr Hartley Barefoot   Past Surgical History:  Procedure Laterality Date   ANTERIOR CERVICAL DECOMP/DISCECTOMY FUSION N/A 04/30/2021   Procedure: Anterior Cervical Decompression Fusion Cervical three-four, Cervical four-five, Cervical six-seven;  Surgeon: Vallarie Mare, MD;  Location: [facility];  Service: Neurosurgery;  Laterality: N/A;   CERVICAL PLATE INSERTION     POST  COMPRESSION DISC INJURY   LIPOMAS REMOVAL     BENIGN FROM CHEST AND BACK   Social History:  reports that he has never smoked. He has never used smokeless tobacco. He reports that he does not currently use alcohol. He reports that he does not use drugs.  No Known Allergies  Family History  Problem Relation Age of Onset   Breast cancer Mother    Heart attack Father 10    Kidney disease Brother        RENAL FAILURE   Hypertension Brother        (died from sepsis at 54)   Heart attack Maternal Aunt 65       CABG   Stroke Maternal Uncle        CVA   Heart attack Paternal Grandmother    Diabetes Neg Hx    Colon cancer Neg Hx    Esophageal cancer Neg Hx    Rectal cancer Neg Hx    Stomach cancer Neg Hx    Colon polyps Neg Hx     Prior to Admission medications   Medication Sig Start Date End Date Taking? Authorizing Provider  Brimonidine Tartrate (LUMIFY) 0.025 % SOLN Place 1 drop into both eyes daily.    [provider]  cholecalciferol (VITAMIN D3) 25 MCG (1000 UT) tablet Take 1 tablet (1,000 Units total) by mouth daily. 11/28/18   [name], NP  lenalidomide (REVLIMID) 10 MG capsule Take 1 capsule (10 mg total) by mouth daily. TAKE 1 CAPSULE ONCE DAILY FOR 21 DAYS, THEN 7 DAYS OFF Strength: 10 mg [id number] 08/28/22   [name], MD  losartan (COZAAR) 50 MG tablet Take 1 tablet (50 mg total) by mouth daily. 08/18/22   [name], NP  omega-3 acid ethyl esters (LOVAZA) 1 g capsule Take 2 capsules (2 g total) by mouth 2 (two) times daily. 06/26/22   [name], NP  tadalafil (CIALIS) 5 MG tablet Take 1 tablet by mouth everyday Patient taking differently: Take 5 mg by mouth daily as needed for erectile dysfunction. 05/18/22     tamsulosin (FLOMAX) 0.4 MG CAPS capsule Take 1 CAPSULE by mouth ONCE DAILY Patient taking differently: Take 0.4 mg by mouth daily. 02/25/21       Physical Exam: Vitals:   09/10/22 0500 09/10/22 0515 09/10/22 0530 09/10/22 0545  BP: 133/82 130/84 133/83 131/87  Pulse: 96 (!) 104 91 93  Resp: (!) _0 Temp:      TempSrc:      SpO2: 96% 96% 97% 97%  Weight:      Height:        Constitutional: Older adult male who appears fatigued Eyes: PERRL, lids and conjunctivae normal ENMT: Mucous membranes are moist. Posterior pharynx clear of any exudate or lesions.Normal dentition.   Neck: normal, supple, no masses, no thyromegaly Respiratory: clear to auscultation bilaterally, no wheezing, no crackles. Normal respiratory effort. No accessory muscle use.  Cardiovascular: Regular rate and rhythm, no murmurs / rubs / gallops. No extremity edema. 2+ pedal pulses. No carotid bruits.  Abdomen: no tenderness, no masses palpated. No hepatosplenomegaly. Bowel sounds positive.  Musculoskeletal: no clubbing / cyanosis. No joint deformity upper and lower extremities. Good ROM, no contractures. Normal muscle tone.  Skin: no rashes, lesions, ulcers. No induration Neurologic: CN 2-12 grossly intact. Sensation intact, DTR normal. Strength 5/5 in all 4.  Psychiatric: Normal judgment and insight. Alert and oriented x 3. Normal mood.   Data Reviewed:  ED EKG reveals sinus rhythm at 92 bpm with left axis deviation.  Reviewed labs, imaging and pertinent records as noted above in HPI  Assessment and Plan: Syncope and collapse Acute.  Patient presents after having episode while at home.  Initial EKG without significant ischemic changes.  Patient had recent coronary CT which was reported to give no significant concern for coronary artery disease.  Suspect vagal in nature given episodes of diarrhea and reports of dry heaving prior to loss of consciousness. -Admit to a telemetry bed -Follow-up telemetry  Nausea, vomiting, and diarrhea  Patient presents with cute onset of nausea, vomiting, and diarrhea after recently eating raw oysters.  CT noted liquid stools within the sigmoid and rectum consistent with diarrhea.  Suspect gastroenteritis possibly related to vibrio infection.  Patient was started on Levaquin. -Monitor intake and output -Follow-up stool studies -Continue Levaquin. -Probiotics -IV fluids at 75 mL/h  Renal insufficiency Acute.  Presents with creatinine elevated up to 1.42 with a BUN 13.  Baseline creatinine previously noted to be around 1.29 on 9/26. -Avoid nephrotoxic  agents -Normal saline IV fluids at 75 mL/h  Right lower lobe pneumonia Prior to arrival.  Patient had been hospitalized at the end of September for right lower lobe pneumonia.  CT of the chest shows persistent right lower lobe unchanged from recent coronary CT which noted some improvement from initial to suggest resolving infection. -Check procalcitonin  Essential hypertension Blood pressures currently maintained.  Home regimen includes losartan 50 mg daily. -Held losartan and resume when medically appropriate  Kappa light chain multiple myeloma s/p bone marrow transplant immunosuppression due to drug therapy Patient diagnosed with kappa light chain myeloma and is followed by Dr. Marin Olp.  Received autologous stem cell transplant 2016 and has been on Xgeva q. 3 months and Revlimid on hold since last hospitalized in September. -Dr. Marin Olp messaged regarding admission and added to team  GI prophylaxis: Protonix DVT prophylaxis: Lovenox Advance Care Planning:   Code Status: Full Code   Consults:   Family Communication: Wife updated at bedside  Severity of Illness: The appropriate patient status for this patient is OBSERVATION. Observation status is judged to be reasonable and necessary in order to provide the required intensity of service to ensure the patient's safety. The patient's presenting symptoms, physical exam findings, and initial radiographic and laboratory data in the context of their medical condition is felt to place them at decreased risk for further clinical deterioration. Furthermore, it is anticipated that the patient will be medically stable for discharge from the hospital within 2 midnights of admission.   Author: Norval Morton, MD 09/10/2022 8:02 AM  For on call review [email or web address].

## 2022-09-10 NOTE — ED Notes (Signed)
Patient transported to CT 

## 2022-09-10 NOTE — ED Provider Notes (Addendum)
[facility] EMERGENCY DEPARTMENT Provider Note   CSN: [id number] Arrival date & time: 09/10/22  0419     History  Chief Complaint  Patient presents with   Loss of Consciousness    Jimmy Day is a 61 y.o. male.  The history is provided by the EMS personnel, the spouse and medical records.  Loss of Consciousness Jimmy Day is a 61 y.o. male who presents to the Emergency Department complaining of syncope.  Level V caveat due to AMS.  He presents to the emergency department by EMS for evaluation following a syncopal episode.  History is provided by EMS, wife and patient.  EMS report that he went to use the bathroom and had a syncopal event.  They report that he has been minimally responsive and hypertensive.  After patient's initial arrival wife reports that when she arrived home at 2 PM he was awake and alert and had significant diarrhea and complained of abdominal pain and then went to vomit and became unresponsive.  Later during patient's ED stay he is able to provide meaningful history.  Patient states that he was feeling well until early this morning when he developed numerous episodes of profuse watery diarrhea with upper abdominal pain, nausea and vomiting.  He states that the diarrhea appears like beet juice.  He does drink significant quantities of beet juice.  No reports of fevers.  No headache.  He does state that he remembers the period where he was told he was unresponsive and states he could hear people but was not unable to speak.  No loss of bowel or bladder.  He is currently taking his chemotherapy for multiple myeloma, has 5 days left.  He was recently hospitalized for pneumonia.  Not currently on antibiotics.  He did consume raw oysters yesterday.  No reports of fevers.        Home Medications Prior to Admission medications   Medication Sig Start Date End Date Taking? Authorizing Provider  Brimonidine Tartrate (LUMIFY) 0.025 % SOLN Place 1 drop  into both eyes daily.    [provider]  cholecalciferol (VITAMIN D3) 25 MCG (1000 UT) tablet Take 1 tablet (1,000 Units total) by mouth daily. 11/28/18   [name], NP  lenalidomide (REVLIMID) 10 MG capsule Take 1 capsule (10 mg total) by mouth daily. TAKE 1 CAPSULE ONCE DAILY FOR 21 DAYS, THEN 7 DAYS OFF Strength: 10 mg [id number] 08/28/22   [name], MD  losartan (COZAAR) 50 MG tablet Take 1 tablet (50 mg total) by mouth daily. 08/18/22   [name], NP  omega-3 acid ethyl esters (LOVAZA) 1 g capsule Take 2 capsules (2 g total) by mouth 2 (two) times daily. 06/26/22   [name], NP  tadalafil (CIALIS) 5 MG tablet Take 1 tablet by mouth everyday Patient taking differently: Take 5 mg by mouth daily as needed for erectile dysfunction. 05/18/22     tamsulosin (FLOMAX) 0.4 MG CAPS capsule Take 1 CAPSULE by mouth ONCE DAILY Patient taking differently: Take 0.4 mg by mouth daily. 02/25/21         Allergies    Patient has no known allergies.    Review of Systems   Review of Systems  Cardiovascular:  Positive for syncope.  All other systems reviewed and are negative.   Physical Exam Updated Vital Signs BP 131/87   Pulse 93   Temp 98.1 F (36.7 C) (Oral)   Resp 20   Ht 6' (1.829 m)  Wt 90.7 kg   SpO2 97%   BMI 27.12 kg/m  Physical Exam Vitals and nursing note reviewed.  Constitutional:      Appearance: He is well-developed. He is diaphoretic.     Comments: lethargic  HENT:     Head: Normocephalic and atraumatic.  Cardiovascular:     Rate and Rhythm: Normal rate and regular rhythm.     Heart sounds: No murmur heard. Pulmonary:     Effort: Pulmonary effort is normal. No respiratory distress.     Breath sounds: Normal breath sounds.  Abdominal:     Palpations: Abdomen is soft.     Tenderness: There is no rebound.     Comments: Moderate generalized abdominal tenderness, voluntary guarding.  Musculoskeletal:        General: No  tenderness.  Skin:    General: Skin is warm.  Neurological:     Comments: Weakly follows commands and moves extremities symmetrically.  Opens eyes to verbal stimuli.  Mumbling speech.  Psychiatric:     Comments: Unable to assess     ED Results / Procedures / Treatments   Labs (all labs ordered are listed, but only abnormal results are displayed) Labs Reviewed  COMPREHENSIVE METABOLIC PANEL - Abnormal; Notable for the following components:      Result Value   Glucose, Bld 107 (*)    Creatinine, Ser 1.42 (*)    Alkaline Phosphatase 31 (*)    GFR, Estimated 56 (*)    All other components within normal limits  CBC WITH DIFFERENTIAL/PLATELET - Abnormal; Notable for the following components:   Lymphs Abs 0.2 (*)    All other components within normal limits  I-STAT VENOUS BLOOD GAS, ED - Abnormal; Notable for the following components:   pO2, Ven 49 (*)    All other components within normal limits  I-STAT CHEM 8, ED - Abnormal; Notable for the following components:   Creatinine, Ser 1.40 (*)    Glucose, Bld 100 (*)    Calcium, Ion 1.11 (*)    All other components within normal limits  CULTURE, BLOOD (ROUTINE X 2)  CULTURE, BLOOD (ROUTINE X 2)  LACTIC ACID, PLASMA  LACTIC ACID, PLASMA  LIPASE, BLOOD  URINALYSIS, ROUTINE W REFLEX MICROSCOPIC  TROPONIN I (HIGH SENSITIVITY)  TROPONIN I (HIGH SENSITIVITY)    EKG EKG Interpretation  Date/Time:  Thursday September 10 2022 04:17:09 EDT Ventricular Rate:  92 PR Interval:  139 QRS Duration: 90 QT Interval:  354 QTC Calculation: 438 R Axis:   -42 Text Interpretation: Sinus rhythm Left axis deviation Confirmed by Palumbo, April ([id number]) on 09/10/2022 4:20:38 AM  Radiology CT Angio Chest/Abd/Pel for Dissection W and/or W/WO  Result Date: 09/10/2022 CLINICAL DATA:  Acute aortic syndrome (AAS) suspected EXAM: CT ANGIOGRAPHY CHEST, ABDOMEN AND PELVIS TECHNIQUE: Non-contrast CT of the chest was initially obtained. Multidetector CT  imaging through the chest, abdomen and pelvis was performed using the standard protocol during bolus administration of intravenous contrast. Multiplanar reconstructed images and MIPs were obtained and reviewed to evaluate the vascular anatomy. RADIATION DOSE REDUCTION: This exam was performed according to the departmental dose-optimization program which includes automated exposure control, adjustment of the mA and/or kV according to patient size and/or use of iterative reconstruction technique. CONTRAST:  50mL OMNIPAQUE IOHEXOL 350 MG/ML SOLN COMPARISON:  CT abdomen 08/08/2022.  CTA chest, 09/08/2022. FINDINGS: CTA CHEST FINDINGS Cardiovascular: Preferential opacification of the thoracic aorta. No evidence of thoracic aortic aneurysm or dissection. No central or larger pulmonary embolus. Normal heart   size. No pericardial effusion. Mediastinum/Nodes: No enlarged mediastinal, hilar, or axillary lymph nodes. Thyroid gland, trachea, and esophagus demonstrate no significant findings. Lungs/Pleura: Similar degree appearance of RIGHT lower lobe airspace opacities to most recent CT chest. No new focal consolidation. No suspicious pulmonary nodule or mass. No pleural effusion or pneumothorax. Musculoskeletal: Cervical ACDF, incompletely imaged. No acute chest wall abnormality or significant osseous findings. Review of the MIP images confirms the above findings. CTA ABDOMEN AND PELVIS FINDINGS VASCULAR Aorta: Normal caliber aorta without aneurysm, dissection, vasculitis or significant stenosis. Celiac: Patent without evidence of aneurysm, dissection, vasculitis or significant stenosis. SMA: Patent without evidence of aneurysm, dissection, vasculitis or significant stenosis. Renals: Single renal arteries are present. Both renal arteries are patent without evidence of aneurysm, dissection, vasculitis, fibromuscular dysplasia or significant stenosis. IMA: Patent without evidence of aneurysm, dissection, vasculitis or significant  stenosis. Pelvis: Patent without evidence of aneurysm, dissection, vasculitis or significant stenosis. Veins: No obvious venous abnormality within the limitations of this arterial phase study. Review of the MIP images confirms the above findings. NON-VASCULAR Hepatobiliary: Normal volume of liver and contour. 1 cm focal hypodense lesion within caudate and additional smaller subcentimeter lesion within anterior RIGHT hepatic lobe, too small to adequately characterize though likely small cyst versus hemangiomas. No gallstones, gallbladder wall thickening, or biliary dilatation. Pancreas: No pancreatic ductal dilatation or surrounding inflammatory changes. Spleen: Normal in size without focal abnormality. Adrenals/Urinary Tract: Adrenal glands are unremarkable. Kidneys are normal, without renal calculi, focal lesion, or hydronephrosis. Bladder is unremarkable. Stomach/Bowel: Stomach is within normal limits. Appendix appears normal. Liquid stool within distal sigmoid and rectum. No evidence of bowel wall thickening, distention, or inflammatory changes. Lymphatic: No enlarged abdominal or pelvic lymph nodes. Reproductive: Prostatomegaly with calcification. Other: No abdominal wall hernia or abnormality. No abdominopelvic ascites. Musculoskeletal: No acute or significant osseous findings. Review of the MIP images confirms the above findings. IMPRESSION: 1. No acute vascular abnormality within the chest, abdomen or pelvis. 2. Persistent, unchanged RIGHT lower lobe consolidation consistent with pneumonia. Reiterate recommendation for follow-up PA and lateral chest XR in 4-6 weeks to confirm resolution. 3. No acute abdominopelvic process. 4. Liquid stool within distal sigmoid and rectum, correlate for diarrhea. Electronically Signed   By: Michaelle Birks M.D.   On: 09/10/2022 06:36   CT Head Wo Contrast  Result Date: 09/10/2022 CLINICAL DATA:  Mental status change. EXAM: CT HEAD WITHOUT CONTRAST TECHNIQUE: Contiguous axial  images were obtained from the base of the skull through the vertex without intravenous contrast. RADIATION DOSE REDUCTION: This exam was performed according to the departmental dose-optimization program which includes automated exposure control, adjustment of the mA and/or kV according to patient size and/or use of iterative reconstruction technique. COMPARISON:  08/08/2022 FINDINGS: Brain: There is no evidence for acute hemorrhage, hydrocephalus, mass lesion, or abnormal extra-axial fluid collection. No definite CT evidence for acute infarction. Vascular: No hyperdense vessel or unexpected calcification. Skull: No evidence for fracture. No worrisome lytic or sclerotic lesion. Sinuses/Orbits: The visualized paranasal sinuses and mastoid air cells are clear. Visualized portions of the globes and intraorbital fat are unremarkable. Other: None. IMPRESSION: Unremarkable exam.  No acute intracranial abnormality. Electronically Signed   By: Misty Stanley M.D.   On: 09/10/2022 06:26   DG Chest Port 1 View  Result Date: 09/10/2022 CLINICAL DATA:  Nausea and vomiting.  Syncope EXAM: PORTABLE CHEST 1 VIEW COMPARISON:  08/11/2022 FINDINGS: Normal heart size and mediastinal contours. Low volume chest. No acute infiltrate or edema. Improved inflation in  the right lower lobe. No effusion or pneumothorax. No acute osseous findings, history of myeloma. Artifact from EKG leads IMPRESSION: No evidence of active cardiopulmonary disease. Electronically Signed   By: Jorje Guild M.D.   On: 09/10/2022 04:42   CT CARDIAC SCORING (SELF PAY ONLY)  Result Date: 09/09/2022 CLINICAL DATA:  The following report is an over-read performed by radiologist Dr. Marijo Conception of [facility], [place] on 09/09/2022. This over-read does not include interpretation of cardiac or coronary anatomy or pathology. The coronary calcium score interpretation by the cardiologist is attached. COMPARISON:  August 11, 2022. FINDINGS: The  visualized portions of the extracardiac vascular structures are unremarkable. Visualized mediastinum is unremarkable. Visualized portion of upper abdomen is unremarkable. Right lower lobe airspace opacity is noted which is significantly decreased compared to prior exam and consistent with improving inflammation and or postinfectious scarring. Multiple lucencies are noted within the visualized thoracic spine and sternum consistent with history of multiple myeloma. IMPRESSION: Right lower lobe airspace opacity is noted which is significantly decreased compared to prior exam of August 11, 2022 and consistent with proving inflammation and or postinfectious scarring. Multiple lucencies are noted within the visualized thoracic spine and sternum consistent with history of multiple myeloma. Electronically Signed   By: Marijo Conception M.D.   On: 09/09/2022 09:15   ECHOCARDIOGRAM COMPLETE  Result Date: 09/08/2022    ECHOCARDIOGRAM REPORT   Patient Name:   MAKAYLA CONFER Date of Exam: 09/08/2022 Medical Rec #:  [id number]    Height:       72.0 in Accession #:    [id number]   Weight:       181.0 lb Date of Birth:  [birth year]    BSA:          2.042 m Patient Age:    47 years     BP:           136/85 mmHg Patient Gender: M            HR:           77 bpm. Exam Location:  [place] Procedure: 2D Echo, 3D Echo, Cardiac Doppler and Color Doppler Indications:    Dyspnea on exertion  History:        Patient has no prior history of Echocardiogram examinations.  Sonographer:    Merrie Roof RDCS Referring Phys: [place]  1. Left ventricular ejection fraction, by estimation, is 60 to 65%. The left ventricle has normal function. The left ventricle has no regional wall motion abnormalities. Left ventricular diastolic parameters are consistent with Grade I diastolic dysfunction (impaired relaxation). The average left ventricular global longitudinal strain is -16.3 %. The global longitudinal strain is mildly  reduced.  2. Right ventricular systolic function is normal. The right ventricular size is normal.  3. The mitral valve is normal in structure. Mild mitral valve regurgitation. No evidence of mitral stenosis.  4. The aortic valve is tricuspid. Aortic valve regurgitation is not visualized. No aortic stenosis is present.  5. The inferior vena cava is normal in size with greater than 50% respiratory variability, suggesting right atrial pressure of 3 mmHg. FINDINGS  Left Ventricle: Left ventricular ejection fraction, by estimation, is 60 to 65%. The left ventricle has normal function. The left ventricle has no regional wall motion abnormalities. The average left ventricular global longitudinal strain is -16.3 %. The global longitudinal strain is abnormal. The left ventricular internal cavity size was normal in size. There is  no left ventricular hypertrophy. Left ventricular diastolic parameters are consistent with Grade I diastolic dysfunction (impaired relaxation). Normal left ventricular filling pressure. Right Ventricle: The right ventricular size is normal. No increase in right ventricular wall thickness. Right ventricular systolic function is normal. Left Atrium: Left atrial size was normal in size. Right Atrium: Right atrial size was normal in size. Pericardium: There is no evidence of pericardial effusion. Mitral Valve: The mitral valve is normal in structure. Mild mitral valve regurgitation. No evidence of mitral valve stenosis. Tricuspid Valve: The tricuspid valve is normal in structure. Tricuspid valve regurgitation is mild . No evidence of tricuspid stenosis. Aortic Valve: The aortic valve is tricuspid. Aortic valve regurgitation is not visualized. No aortic stenosis is present. Aortic valve mean gradient measures 4.0 mmHg. Aortic valve peak gradient measures 7.0 mmHg. Aortic valve area, by VTI measures 2.65 cm. Pulmonic Valve: The pulmonic valve was normal in structure. Pulmonic valve regurgitation is not  visualized. No evidence of pulmonic stenosis. Aorta: The aortic root and ascending aorta are structurally normal, with no evidence of dilitation and the aortic arch was not well visualized. Venous: The pulmonary veins were not well visualized. The inferior vena cava is normal in size with greater than 50% respiratory variability, suggesting right atrial pressure of 3 mmHg. IAS/Shunts: No atrial level shunt detected by color flow Doppler.  LEFT VENTRICLE PLAX 2D LVIDd:         5.20 cm   Diastology LVIDs:         3.30 cm   LV e' medial:    9.03 cm/s LV PW:         0.90 cm   LV E/e' medial:  6.7 LV IVS:        0.90 cm   LV e' lateral:   11.60 cm/s LVOT diam:     2.10 cm   LV E/e' lateral: 5.2 LV SV:         62 LV SV Index:   30        2D Longitudinal Strain LVOT Area:     3.46 cm  2D Strain GLS Avg:     -16.3 %  RIGHT VENTRICLE RV Basal diam:  3.60 cm RV S prime:     12.90 cm/s TAPSE (M-mode): 2.5 cm LEFT ATRIUM             Index        RIGHT ATRIUM           Index LA diam:        4.30 cm 2.11 cm/m   RA Area:     15.20 cm LA Vol (A2C):   42.7 ml 20.91 ml/m  RA Volume:   37.00 ml  18.12 ml/m LA Vol (A4C):   43.4 ml 21.25 ml/m LA Biplane Vol: 46.9 ml 22.97 ml/m  AORTIC VALVE AV Area (Vmax):    2.70 cm AV Area (Vmean):   2.66 cm AV Area (VTI):     2.65 cm AV Vmax:           132.00 cm/s AV Vmean:          93.200 cm/s AV VTI:            0.233 m AV Peak Grad:      7.0 mmHg AV Mean Grad:      4.0 mmHg LVOT Vmax:         103.00 cm/s LVOT Vmean:        71.600 cm/s LVOT VTI:            0.178 m LVOT/AV VTI ratio: 0.76  AORTA Ao Root diam: 3.40 cm MITRAL VALVE MV Area (PHT): 3.10 cm    SHUNTS MV Decel Time: 245 msec    Systemic VTI:  0.18 m MV E velocity: 60.80 cm/s  Systemic Diam: 2.10 cm MV A velocity: 72.80 cm/s MV E/A ratio:  0.84 Brian Munley MD Electronically signed by Brian Munley MD Signature Date/Time: 09/08/2022/6:04:58 PM    Final     Procedures Procedures   CRITICAL CARE Performed by: Elizabeth  Rees   Total critical care time: 45 minutes  Critical care time was exclusive of separately billable procedures and treating other patients.  Critical care was necessary to treat or prevent imminent or life-threatening deterioration.  Critical care was time spent personally by me on the following activities: development of treatment plan with patient and/or surrogate as well as nursing, discussions with consultants, evaluation of patient's response to treatment, examination of patient, obtaining history from patient or surrogate, ordering and performing treatments and interventions, ordering and review of laboratory studies, ordering and review of radiographic studies, pulse oximetry and re-evaluation of patient's condition.  Medications Ordered in ED Medications  levofloxacin (LEVAQUIN) IVPB 750 mg (750 mg Intravenous New Bag/Given 09/10/22 0635)  sodium chloride 0.9 % bolus 1,000 mL (0 mLs Intravenous Stopped 09/10/22 0601)  ondansetron (ZOFRAN) injection 4 mg (4 mg Intravenous Given 09/10/22 0430)  iohexol (OMNIPAQUE) 350 MG/ML injection 50 mL (50 mLs Intravenous Contrast Given 09/10/22 0616)    ED Course/ Medical Decision Making/ A&P                           Medical Decision Making Amount and/or Complexity of Data Reviewed Labs: ordered. Radiology: ordered.  Risk Prescription drug management. Decision regarding hospitalization.   Patient with history of multiple myeloma here for evaluation following syncopal event.  He did have preceding diarrhea followed by nausea and vomiting.  Patient minimally responsive at time of ED arrival.  Patient's mental status improved over the course of 20 minutes and he developed nausea and vomiting with large amount of emesis.  On repeat evaluation patient with soft and nontender abdomen.  Given patient's altered mental status, abdominal pain and severe hypertension CT head as well as CTA dissection protocol obtained to rule out dissection as well  as subarachnoid hemorrhage.  Imaging significant for persistent pneumonia, findings consistent with diarrheal state, no additional significant findings.  BMP with mild elevation in creatinine.  Patient was treated with IV fluids.  Given his recent raw oyster consumption and relative immunosuppression he was treated with Levaquin for possible vibrio infection after discussion with pharmacy.  Given patient's significant period of unresponsiveness with syncopal episode as well as possible vibrio infection recommend admission for observation.  Discussed with patient and wife this recommendation and they are in agreement with plan.  Hospitalist consulted for admission.       Final Clinical Impression(s) / ED Diagnoses Final diagnoses:  Syncope and collapse  Nausea vomiting and diarrhea    Rx / DC Orders ED Discharge Orders     None         Rees, Elizabeth, MD 09/10/22 0715    Rees, Elizabeth, MD 09/10/22 0730  

## 2022-09-11 ENCOUNTER — Inpatient Hospital Stay: Payer: 59 | Admitting: Hematology & Oncology

## 2022-09-11 ENCOUNTER — Inpatient Hospital Stay: Payer: 59

## 2022-09-11 DIAGNOSIS — Z9484 Stem cells transplant status: Secondary | ICD-10-CM

## 2022-09-11 DIAGNOSIS — C9001 Multiple myeloma in remission: Secondary | ICD-10-CM

## 2022-09-11 DIAGNOSIS — K529 Noninfective gastroenteritis and colitis, unspecified: Secondary | ICD-10-CM

## 2022-09-11 DIAGNOSIS — R55 Syncope and collapse: Secondary | ICD-10-CM | POA: Diagnosis not present

## 2022-09-11 LAB — CBC
HCT: 38.1 % — ABNORMAL LOW (ref 39.0–52.0)
Hemoglobin: 13 g/dL (ref 13.0–17.0)
MCH: 32.1 pg (ref 26.0–34.0)
MCHC: 34.1 g/dL (ref 30.0–36.0)
MCV: 94.1 fL (ref 80.0–100.0)
Platelets: 137 10*3/uL — ABNORMAL LOW (ref 150–400)
RBC: 4.05 MIL/uL — ABNORMAL LOW (ref 4.22–5.81)
RDW: 14.8 % (ref 11.5–15.5)
WBC: 3 10*3/uL — ABNORMAL LOW (ref 4.0–10.5)
nRBC: 0 % (ref 0.0–0.2)

## 2022-09-11 LAB — GASTROINTESTINAL PANEL BY PCR, STOOL (REPLACES STOOL CULTURE)

## 2022-09-11 LAB — BASIC METABOLIC PANEL
Anion gap: 6 (ref 5–15)
BUN: 7 mg/dL — ABNORMAL LOW (ref 8–23)
CO2: 23 mmol/L (ref 22–32)
Calcium: 7.4 mg/dL — ABNORMAL LOW (ref 8.9–10.3)
Chloride: 109 mmol/L (ref 98–111)
Creatinine, Ser: 1.18 mg/dL (ref 0.61–1.24)
GFR, Estimated: >60 mL/min (ref >60)
Glucose, Bld: 97 mg/dL (ref 70–99)
Potassium: 3.4 mmol/L — ABNORMAL LOW (ref 3.5–5.1)
Sodium: 138 mmol/L (ref 135–145)

## 2022-09-11 LAB — PROCALCITONIN: Procalcitonin: 0.23 ng/mL

## 2022-09-11 LAB — TSH: TSH: 1.101 u[IU]/mL (ref 0.350–4.500)

## 2022-09-11 MED ORDER — ORAL CARE MOUTH RINSE: 15.0000 mL | OROMUCOSAL | Status: DC | PRN

## 2022-09-11 MED ORDER — CALCIUM GLUCONATE-NACL 1-0.675 GM/50ML-% IV SOLN
1.0000 g | Freq: Once | INTRAVENOUS | Status: DC
  Filled 2022-09-11: qty 50

## 2022-09-11 MED ORDER — ZOLEDRONIC ACID 4 MG/5ML IV CONC
4.0000 mg | Freq: Once | INTRAVENOUS | Status: DC
  Filled 2022-09-11: qty 5

## 2022-09-11 MED ORDER — LOPERAMIDE HCL 2 MG PO TABS: 2.0000 mg | ORAL_TABLET | Freq: Two times a day (BID) | ORAL | 0 refills | Status: AC | PRN

## 2022-09-11 NOTE — Consult Note (Signed)
Jimmy Day is a very nice 61 year old African-American male.  He is well-known to me.  He has a long history of IgG kappa myeloma.  He initially presented back in 2015.  He ultimately underwent a stem cell transplant at [facility] in April 2016.  Been on Revlimid as a maintenance therapy.  He is doing well but would.  His blood counts typically have been doing quite well.  Occasion, he may have blood counts a little bit lower.    He Was Hospitalized Back in September with Pneumonia.  He Was Treated with IV Antibiotics.  At That Time, His Blood Counts Were Okay.  He Has Been Working.  He Has Had No Problems with Work.  Recently, He Ate Well Oysters.  He Likes Raw [employer]'s.  He Subsequently Had A Lot Of Diarrhea Afterwards.  He Was Found at Home by His Wife.  She Heard from [employer].  He Subsequently Was Brought to the Emergency Room.  He Was Admitted on 09/10/2022.    When he came in, his lab work showed a sodium of 139.  Potassium 4.2.  BUN 17 creatinine 1.4.  His calcium was 9 with an albumin of 3.7.  His white count is 4.2.  Hemoglobin 14.3.  Platelet 160,000.  He had a CT angiogram of the chest abdomen pelvis.  This still showed some changes with pneumonia.  He had requested only colon.  Otherwise, everything looked okay.  He had a CT scan of the head.  He had a chest x-ray done which did not show any infiltrate.    He says that he had norovirus.  I do not see any documentation of this in the chart.  He says significant norovirus after having oysters.  Since his diarrhea is a lot better.  However, he might be going home today.  He is supposed to have been seen in the office today.  I told him that we do not have to see him in the office today.  I will give him his bone medicine in the hospital.  We will check his myeloma studies.  He has been off Revlimid.  I do not see a problem with being off Revlimid.  He has been on Revlimid for about 7 years.  I would keep him off the Revlimid for  right now.  There is no pain.  He has had no bleeding.  He has had no fever.  He has had no cough or shortness of breath.  He has had no nausea or vomiting.  There has been no leg swelling.    His labs this morning show white count of 3 hemoglobin 13 platelets 137,000.  His sodium is 138.  Potassium 3.4.  BUN 70 creatinine 1.18.  Calcium 7.4.  There has been no bleeding.  He has had no leg swelling.  He has had no problems urinating.  Overall, his performance status is ECOG 1.   His vital signs show temperature of 97.8.  Pulse 84.  Blood pressure 125/93.  He has had neck exam shows no ocular or oral lesions.  He has no adenopathy in the neck.  He has no mucositis in the oral cavity.  Lungs are clear.  Cardiac exam regular rate and rhythm.  Abdomen is soft.  Bowel sounds are present.  There is no distention.  There is no guarding or rebound tenderness.  Extremity shows no clubbing, cyanosis or edema.  Skin exam shows no rashes.  Neurological exam is nonfocal.   [name] is a 61 year old African-American male with myeloma.  His myeloma is in remission from what I can tell.  Again, he has this gastroenteritis.  He says this is Norovirus.  I do not see where any test has been done to confirm this.  Maybe a test was done as an outpatient somewhere.  He seems to be getting better.  As such, it sounds like he may go home today.  I will have to get back to the office in about 3 to 4 weeks.  I would keep him off the Revlimid right now.  It is obvious that he is got great care from everybody on [address].  Lattie Haw, MD  Jimmy Day 7:7

## 2022-09-11 NOTE — Discharge Summary (Signed)
Physician Discharge Summary  Jimmy Day BEE:[id number] DOB: [birth year] DOA: 09/10/2022  PCP: [name], NP  Admit date: 09/10/2022 Discharge date: 09/11/2022  Admitted From: Home Discharge disposition: Home  Recommendations at discharge:  Maintain oral hydration at home Can start Imodium as needed from tomorrow. If diarrhea worsens, advised to return back to hospital   Brief narrative: Jimmy Day is a 61 y.o. male with PMH significant for IgG kappa myeloma s/p BMT on Revlimid followed by Dr. 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 Per history, previous night, patient ate raw oysters.  In the night he woke up about 7-8 times because of diarrhea.  Early next morning, while he was sitting on the commode, he passed out and fell to the floor.  His wife heard him fall and noted him minimally responsive.   EMS noted him unresponsive and with elevated blood pressure.  Brought to the ED.  Of note, most recent hospitalization 9/16 -9/21 for community-acquired pneumonia initially treated with IV antibiotics of Rocephin and azithromycin x3 days, but spiking fevers up to 103.1 F for which he was switched to vancomycin and cefepime.  Patient was discharged home to complete 5 days of Levaquin.    In the ED, patient was initially afebrile, heart rate in 90s, blood pressure 130s, breathing on room air. Labs with normal CBC, normal lactic acid, normal troponin, BUN/creatinine elevated to 13/1.42 Chest x-ray normal. CT head unremarkable for any acute abnormality.   CT angio of chest, abdomen, pelvis did not show any acute vascular abnormality.  But it showed persistent, unchanged RIGHT lower lobe consolidation consistent with pneumonia.  He also showed liquid stool within distal sigmoid and rectum. Patient was started on empiric antibiotics, IV fluid, IV antiemetics. Admitted to [facility] Oncology was consulted. Stool studies came  positive for norovirus.  Subjective: Patient was seen and examined this morning. Middle-aged Caucasian male.  Sitting up in bed.  Not in distress.  Tolerating his breakfast. No emesis in 24 hours. Reports 2-3 episodes of BM since last night.  It is getting soft. Wants to go home today.  Wife at bedside  Assessment and plan: Syncope and collapse Likely orthostatic from profuse diarrhea related to norovirus. Colonoscopy, blood pressure have remained stable. Telemetry monitoring did not reveal any arrhythmia. Other possible causes of syncope were considered as well. Initial EKG without significant ischemic changes.  Patient had recent coronary CT which was reported to give no significant concern for coronary artery disease.    Acute diarrhea due to norovirus Patient presented with nausea, vomiting, diarrhea after eating raw oysters. GI pathogen panel shows norovirus. CT abdomen showed liquid stool in sigmoid colon and rectum. Tolerating his breakfast. No emesis in 24 hours. Reports 2-3 episodes of BM since last night.  It is getting soft. Wants to go home today.  Wife at bedside and is okay with the plan. Initially IV Levaquin was started with the suspicion of bacterial etiology.  No need to continue antibiotics.   Adequately given IV hydration.  Continue oral hydration at home.  Can start Imodium tomorrow if needed.   Right lower lobe pneumonia Persistent right lower lobe consolidation since his last hospitalization 5 weeks ago.   I do not think patient is symptomatic from this finding. Mild fever overnight is likely because of viral diarrhea Procalcitonin level slightly elevated only. No need of antibiotics at this time. Recent Labs  Lab 09/10/22 0425 09/10/22 5883  09/11/22 0251  WBC 4.2  --  3.0*  LATICACIDVEN 1.1 1.7  --   PROCALCITON  --  0.23  --     Elevated creatinine Baseline creatinine less than 1.3.  Presented with creatinine elevated to 1.42 due to the diarrhea  loss.  Improved with hydration. Recent Labs    08/08/22 1646 08/09/22 0955 08/10/22 0337 08/11/22 0336 08/12/22 0351 08/13/22 0435 08/18/22 1714 09/10/22 0425 09/10/22 0453 09/11/22 0251  BUN _0 7*  CREATININE 1.46* 1.49* 1.34* 1.21 1.15 1.02 1.29 1.42* 1.40* 1.18    Hypocalcemia Calcium level low at 7.4. 1 dose of IV calcium gluconate given this morning.  Continue vitamin D supplementation at home. Recent Labs    08/12/22 0351 08/13/22 0435 08/18/22 1714 09/10/22 0425 09/11/22 0251  CALCIUM 7.8* 8.4* 9.1 9.0 7.4*   Essential hypertension PTA on losartan 50 mg daily.  Resume the same post discharge.   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 Marin Olp.  Received autologous stem cell transplant 2016 and has been on Xgeva q. 3 months.  He was on Revlimid for about 7 years which is currently on hold since last hospitalized in September. Continue to follow-up with Dr. Marin Olp.  Wounds:  -    Discharge Exam:   Vitals:   09/10/22 2153 09/11/22 0021 09/11/22 0609 09/11/22 0743  BP: 125/83 111/81 (!) 125/93 126/89  Pulse: 94 88 84 81  Resp: _1 Temp: 98.9 F (37.2 C) 99.5 F (37.5 C) 97.8 F (36.6 C) 97.6 F (36.4 C)  TempSrc: Oral Oral Oral Oral  SpO2: 99% 100% 100% 100%  Weight:      Height:        Body mass index is 27.12 kg/m.  General exam: Pleasant, middle-aged African-American male.  Not in distress Skin: No rashes, lesions or ulcers. HEENT: Atraumatic, normocephalic, no obvious bleeding Lungs: Clear to auscultation bilaterally CVS: Regular rate and rhythm, no murmur GI/Abd soft, nontender, nondistended, bowel sound present CNS: Alert, awake, oriented x3 Psychiatry: Mood appropriate Extremities: No pedal edema, no calf tenderness  Follow ups:    Follow-up Information      [name], NP Follow up.   Specialty: Internal Medicine Contact information: [place] [address] [phone]         [name], MD Follow up.   Specialty: Oncology Contact information: [address] [place] [address] [phone]                 Discharge Instructions:   Discharge Instructions     Call MD for:  difficulty breathing, headache or visual disturbances   Complete by: As directed    Call MD for:  extreme fatigue   Complete by: As directed    Call MD for:  hives   Complete by: As directed    Call MD for:  persistant dizziness or light-headedness   Complete by: As directed    Call MD for:  persistant nausea and vomiting   Complete by: As directed    Call MD for:  severe uncontrolled pain   Complete by: As directed    Call MD for:  temperature >100.4   Complete by: As directed    Diet general   Complete by: As directed  Discharge instructions   Complete by: As directed    Recommendations at discharge:   Maintain oral hydration at home  Can start Imodium as needed from tomorrow.  If diarrhea worsens, advised to return back to hospital  General discharge instructions: Follow with Primary MD [name], NP in 7 days  Please request your PCP  to go over your hospital tests, procedures, radiology results at the follow up. Please get your medicines reviewed and adjusted.  Your PCP may decide to repeat certain labs or tests as needed. Do not drive, operate heavy machinery, perform activities at heights, swimming or participation in water activities or provide baby sitting services if your were admitted for syncope or siezures until you have seen by Primary MD or a Neurologist and advised to do so again. [place] Controlled Substance Reporting System database was reviewed. Do not drive, operate heavy machinery, perform activities at heights, swim, participate in water  activities or provide baby-sitting services while on medications for pain, sleep and mood until your outpatient physician has reevaluated you and advised to do so again.  You are strongly recommended to comply with the dose, frequency and duration of prescribed medications. Activity: As tolerated with Full fall precautions use walker/cane & assistance as needed Avoid using any recreational substances like cigarette, tobacco, alcohol, or non-prescribed drug. If you experience worsening of your admission symptoms, develop shortness of breath, life threatening emergency, suicidal or homicidal thoughts you must seek medical attention immediately by calling 911 or calling your MD immediately  if symptoms less severe. You must read complete instructions/literature along with all the possible adverse reactions/side effects for all the medicines you take and that have been prescribed to you. Take any new medicine only after you have completely understood and accepted all the possible adverse reactions/side effects.  Wear Seat belts while driving. You were cared for by a hospitalist during your hospital stay. If you have any questions about your discharge medications or the care you received while you were in the hospital after you are discharged, you can call the unit and ask to speak with the hospitalist or the covering physician. Once you are discharged, your primary care physician will handle any further medical issues. Please note that NO REFILLS for any discharge medications will be authorized once you are discharged, as it is imperative that you return to your primary care physician (or establish a relationship with a primary care physician if you do not have one).   Increase activity slowly   Complete by: As directed        Discharge Medications:   Allergies as of 09/11/2022   No Known Allergies      Medication List     STOP taking these medications    lenalidomide 10 MG capsule Commonly  known as: Revlimid       TAKE these medications    cholecalciferol 25 MCG (1000 UNIT) tablet Commonly known as: VITAMIN D3 Take 1 tablet (1,000 Units total) by mouth daily.   KAOPECTATE PO Take 15 mLs by mouth daily as needed (diarrhea).   loperamide 2 MG tablet Commonly known as: Imodium A-D Take 1 tablet (2 mg total) by mouth 2 (two) times daily as needed for up to 5 days for diarrhea or loose stools.   losartan 50 MG tablet Commonly known as: COZAAR Take 1 tablet (50 mg total) by mouth daily.   Lumify 0.025 % Soln Generic drug: Brimonidine Tartrate Place 1 drop into both eyes daily.  omega-3 acid ethyl esters 1 g capsule Commonly known as: LOVAZA Take 2 capsules (2 g total) by mouth 2 (two) times daily.   tadalafil 5 MG tablet Commonly known as: CIALIS Take 1 tablet by mouth everyday What changed:  how much to take how to take this when to take this reasons to take this   tamsulosin 0.4 MG Caps capsule Commonly known as: FLOMAX Take 1 CAPSULE by mouth ONCE DAILY What changed:  how much to take when to take this         The results of significant diagnostics from this hospitalization (including imaging, microbiology, ancillary and laboratory) are listed below for reference.    Procedures and Diagnostic Studies:   CT Angio Chest/Abd/Pel for Dissection W and/or W/WO  Result Date: 09/10/2022 CLINICAL DATA:  Acute aortic syndrome (AAS) suspected EXAM: CT ANGIOGRAPHY CHEST, ABDOMEN AND PELVIS TECHNIQUE: Non-contrast CT of the chest was initially obtained. Multidetector CT imaging through the chest, abdomen and pelvis was performed using the standard protocol during bolus administration of intravenous contrast. Multiplanar reconstructed images and MIPs were obtained and reviewed to evaluate the vascular anatomy. RADIATION DOSE REDUCTION: This exam was performed according to the departmental dose-optimization program which includes automated exposure control,  adjustment of the mA and/or kV according to patient size and/or use of iterative reconstruction technique. CONTRAST:  60m OMNIPAQUE IOHEXOL 350 MG/ML SOLN COMPARISON:  CT abdomen 08/08/2022.  CTA chest, 09/08/2022. FINDINGS: CTA CHEST FINDINGS Cardiovascular: Preferential opacification of the thoracic aorta. No evidence of thoracic aortic aneurysm or dissection. No central or larger pulmonary embolus. Normal heart size. No pericardial effusion. Mediastinum/Nodes: No enlarged mediastinal, hilar, or axillary lymph nodes. Thyroid gland, trachea, and esophagus demonstrate no significant findings. Lungs/Pleura: Similar degree appearance of RIGHT lower lobe airspace opacities to most recent CT chest. No new focal consolidation. No suspicious pulmonary nodule or mass. No pleural effusion or pneumothorax. Musculoskeletal: Cervical ACDF, incompletely imaged. No acute chest wall abnormality or significant osseous findings. Review of the MIP images confirms the above findings. CTA ABDOMEN AND PELVIS FINDINGS VASCULAR Aorta: Normal caliber aorta without aneurysm, dissection, vasculitis or significant stenosis. Celiac: Patent without evidence of aneurysm, dissection, vasculitis or significant stenosis. SMA: Patent without evidence of aneurysm, dissection, vasculitis or significant stenosis. Renals: Single renal arteries are present. Both renal arteries are patent without evidence of aneurysm, dissection, vasculitis, fibromuscular dysplasia or significant stenosis. IMA: Patent without evidence of aneurysm, dissection, vasculitis or significant stenosis. Pelvis: Patent without evidence of aneurysm, dissection, vasculitis or significant stenosis. Veins: No obvious venous abnormality within the limitations of this arterial phase study. Review of the MIP images confirms the above findings. NON-VASCULAR Hepatobiliary: Normal volume of liver and contour. 1 cm focal hypodense lesion within caudate and additional smaller subcentimeter  lesion within anterior RIGHT hepatic lobe, too small to adequately characterize though likely small cyst versus hemangiomas. No gallstones, gallbladder wall thickening, or biliary dilatation. Pancreas: No pancreatic ductal dilatation or surrounding inflammatory changes. Spleen: Normal in size without focal abnormality. Adrenals/Urinary Tract: Adrenal glands are unremarkable. Kidneys are normal, without renal calculi, focal lesion, or hydronephrosis. Bladder is unremarkable. Stomach/Bowel: Stomach is within normal limits. Appendix appears normal. Liquid stool within distal sigmoid and rectum. No evidence of bowel wall thickening, distention, or inflammatory changes. Lymphatic: No enlarged abdominal or pelvic lymph nodes. Reproductive: Prostatomegaly with calcification. Other: No abdominal wall hernia or abnormality. No abdominopelvic ascites. Musculoskeletal: No acute or significant osseous findings. Review of the MIP images confirms the above findings. IMPRESSION: 1. No  acute vascular abnormality within the chest, abdomen or pelvis. 2. Persistent, unchanged RIGHT lower lobe consolidation consistent with pneumonia. Reiterate recommendation for follow-up PA and lateral chest XR in 4-6 weeks to confirm resolution. 3. No acute abdominopelvic process. 4. Liquid stool within distal sigmoid and rectum, correlate for diarrhea. Electronically Signed   By: Michaelle Birks M.D.   On: 09/10/2022 06:36   CT Head Wo Contrast  Result Date: 09/10/2022 CLINICAL DATA:  Mental status change. EXAM: CT HEAD WITHOUT CONTRAST TECHNIQUE: Contiguous axial images were obtained from the base of the skull through the vertex without intravenous contrast. RADIATION DOSE REDUCTION: This exam was performed according to the departmental dose-optimization program which includes automated exposure control, adjustment of the mA and/or kV according to patient size and/or use of iterative reconstruction technique. COMPARISON:  08/08/2022 FINDINGS:  Brain: There is no evidence for acute hemorrhage, hydrocephalus, mass lesion, or abnormal extra-axial fluid collection. No definite CT evidence for acute infarction. Vascular: No hyperdense vessel or unexpected calcification. Skull: No evidence for fracture. No worrisome lytic or sclerotic lesion. Sinuses/Orbits: The visualized paranasal sinuses and mastoid air cells are clear. Visualized portions of the globes and intraorbital fat are unremarkable. Other: None. IMPRESSION: Unremarkable exam.  No acute intracranial abnormality. Electronically Signed   By: Misty Stanley M.D.   On: 09/10/2022 06:26   DG Chest Port 1 View  Result Date: 09/10/2022 CLINICAL DATA:  Nausea and vomiting.  Syncope EXAM: PORTABLE CHEST 1 VIEW COMPARISON:  08/11/2022 FINDINGS: Normal heart size and mediastinal contours. Low volume chest. No acute infiltrate or edema. Improved inflation in the right lower lobe. No effusion or pneumothorax. No acute osseous findings, history of myeloma. Artifact from EKG leads IMPRESSION: No evidence of active cardiopulmonary disease. Electronically Signed   By: Jorje Guild M.D.   On: 09/10/2022 04:42     Labs:   Basic Metabolic Panel: Recent Labs  Lab 09/10/22 0425 09/10/22 0453 09/10/22 0454 09/11/22 0251  NA 138 139 139 138  K 4.0 4.2 4.2 3.4*  CL 108 106  --  109  CO2 22  --   --  23  GLUCOSE 107* 100*  --  97  BUN 13 17  --  7*  CREATININE 1.42* 1.40*  --  1.18  CALCIUM 9.0  --   --  7.4*   GFR Estimated Creatinine Clearance: 72.2 mL/min (by C-G formula based on SCr of 1.18 mg/dL). Liver Function Tests: Recent Labs  Lab 09/10/22 0425  AST 22  ALT 28  ALKPHOS 31*  BILITOT 0.8  PROT 6.5  ALBUMIN 3.7   Recent Labs  Lab 09/10/22 0425  LIPASE 49   No results for input(s): "AMMONIA" in the last 168 hours. Coagulation profile No results for input(s): "INR", "PROTIME" in the last 168 hours.  CBC: Recent Labs  Lab 09/10/22 0425 09/10/22 0453 09/10/22 0454  09/11/22 0251  WBC 4.2  --   --  3.0*  NEUTROABS 3.3  --   --   --   HGB 14.3 15.6 15.0 13.0  HCT 41.7 46.0 44.0 38.1*  MCV 95.0  --   --  94.1  PLT 160  --   --  137*   Cardiac Enzymes: No results for input(s): "CKTOTAL", "CKMB", "CKMBINDEX", "TROPONINI" in the last 168 hours. BNP: Invalid input(s): "POCBNP" CBG: No results for input(s): "GLUCAP" in the last 168 hours. D-Dimer No results for input(s): "DDIMER" in the last 72 hours. Hgb A1c No results for input(s): "HGBA1C" in the  last 72 hours. Lipid Profile No results for input(s): "CHOL", "HDL", "LDLCALC", "TRIG", "CHOLHDL", "LDLDIRECT" in the last 72 hours. Thyroid function studies Recent Labs    09/10/22 0623  TSH 1.101   Anemia work up No results for input(s): "VITAMINB12", "FOLATE", "FERRITIN", "TIBC", "IRON", "RETICCTPCT" in the last 72 hours. Microbiology Recent Results (from the past 240 hour(s))  Culture, blood (routine x 2)     Status: None (Preliminary result)   Collection Time: 09/10/22  5:34 AM   Specimen: BLOOD RIGHT HAND  Result Value Ref Range Status   Specimen Description BLOOD RIGHT HAND  Final   Special Requests   Final    BOTTLES DRAWN AEROBIC AND ANAEROBIC Blood Culture adequate volume   Culture   Final    NO GROWTH 1 DAY Performed at [facility], [address], [place], [place] [address]    Report Status PENDING  Incomplete  Culture, blood (routine x 2)     Status: None (Preliminary result)   Collection Time: 09/10/22  5:58 AM   Specimen: BLOOD RIGHT FOREARM  Result Value Ref Range Status   Specimen Description BLOOD RIGHT FOREARM  Final   Special Requests   Final    BOTTLES DRAWN AEROBIC AND ANAEROBIC Blood Culture adequate volume   Culture   Final    NO GROWTH 1 DAY Performed at [facility], [place] [address], [place], [place] [address]    Report Status PENDING  Incomplete  Gastrointestinal Panel by PCR , Stool     Status: Abnormal   Collection Time: 09/10/22  7:15 AM    Specimen: Stool  Result Value Ref Range Status   Campylobacter species NOT DETECTED NOT DETECTED Final   Plesimonas shigelloides NOT DETECTED NOT DETECTED Final   Salmonella species NOT DETECTED NOT DETECTED Final   Yersinia enterocolitica NOT DETECTED NOT DETECTED Final   Vibrio species NOT DETECTED NOT DETECTED Final   Vibrio cholerae NOT DETECTED NOT DETECTED Final   Enteroaggregative E coli (EAEC) NOT DETECTED NOT DETECTED Final   Enteropathogenic E coli (EPEC) NOT DETECTED NOT DETECTED Final   Enterotoxigenic E coli (ETEC) NOT DETECTED NOT DETECTED Final   Shiga like toxin producing E coli (STEC) NOT DETECTED NOT DETECTED Final   Shigella/Enteroinvasive E coli (EIEC) NOT DETECTED NOT DETECTED Final   Cryptosporidium NOT DETECTED NOT DETECTED Final   Cyclospora cayetanensis NOT DETECTED NOT DETECTED Final   Entamoeba histolytica NOT DETECTED NOT DETECTED Final   Giardia lamblia NOT DETECTED NOT DETECTED Final   Adenovirus F40/41 NOT DETECTED NOT DETECTED Final   Astrovirus NOT DETECTED NOT DETECTED Final   Norovirus GI/GII DETECTED (A) NOT DETECTED Final    Comment: RESULT CALLED TO, READ BACK BY AND VERIFIED WITH: NYCHE THOMAS _0  ON 09/11/22 SKL    Rotavirus A NOT DETECTED NOT DETECTED Final   Sapovirus (I, II, IV, and V) NOT DETECTED NOT DETECTED Final    Comment: Performed at [facility], [address] [place], [place] [address]  C Difficile Quick Screen w PCR reflex     Status: None   Collection Time: 09/10/22  7:15 AM   Specimen: Stool  Result Value Ref Range Status   C Diff antigen NEGATIVE NEGATIVE Final   C Diff toxin NEGATIVE NEGATIVE Final   C Diff interpretation No C. difficile detected.  Final    Comment: Performed at [facility], [place] [address] [place], [place] [address]    Time coordinating discharge: 35 minutes  Signed: Terrilee Croak  [facility] 09/11/2022, 11:43 AM

## 2022-09-11 NOTE — Progress Notes (Signed)
Critical Lab  Pt GI Panel positive for norovirus. MD notified. Pt already on enteric precautions.

## 2022-09-11 NOTE — Progress Notes (Signed)
Mobility Specialist - Progress Note   09/11/22 1102  Mobility  Activity Ambulated independently in hallway  Level of Assistance Independent  Assistive Device Other (Comment) (Iv pole)  Distance Ambulated (ft) 100 ft  Activity Response Tolerated well  Mobility Referral No  $Mobility charge 1 Mobility   Pt received at door way and agreeable. No complaints throughout. Pt was returned to room with all needs met.   Larey Seat

## 2022-09-13 LAB — IGG, IGA, IGM
IgA: 22 mg/dL — ABNORMAL LOW (ref 61–437)
IgG (Immunoglobin G), Serum: 1598 mg/dL (ref 603–1613)
IgM (Immunoglobulin M), Srm: 29 mg/dL (ref 20–172)

## 2022-09-14 ENCOUNTER — Telehealth: Payer: Self-pay

## 2022-09-14 LAB — KAPPA/LAMBDA LIGHT CHAINS
Kappa free light chain: 30.3 mg/L — ABNORMAL HIGH (ref 3.3–19.4)
Kappa, lambda light chain ratio: 2.06 — ABNORMAL HIGH (ref 0.26–1.65)
Lambda free light chains: 14.7 mg/L (ref 5.7–26.3)

## 2022-09-14 NOTE — Telephone Encounter (Signed)
Results reviewed with pt as per Dr. Krasowski's note.  Pt verbalized understanding and had no additional questions. Routed to PCP  

## 2022-09-14 NOTE — Telephone Encounter (Signed)
Transition Care Management Unsuccessful Follow-up Telephone Call  Date of discharge and from where:  09/11/22, [facility]. Syncope and collapse. N/V/D.   Attempts:  1st Attempt  Reason for unsuccessful TCM follow-up call:  Left voice message

## 2022-09-15 LAB — CULTURE, BLOOD (ROUTINE X 2)
Culture: NO GROWTH
Culture: NO GROWTH
Special Requests: ADEQUATE
Special Requests: ADEQUATE

## 2022-09-15 LAB — PROTEIN ELECTROPHORESIS, SERUM
A/G Ratio: 1.3 (ref 0.7–1.7)
Albumin ELP: 3.9 g/dL (ref 2.9–4.4)
Alpha-1-Globulin: 0.2 g/dL (ref 0.0–0.4)
Alpha-2-Globulin: 0.5 g/dL (ref 0.4–1.0)
Beta Globulin: 0.8 g/dL (ref 0.7–1.3)
Gamma Globulin: 1.5 g/dL (ref 0.4–1.8)
Globulin, Total: 3.1 g/dL (ref 2.2–3.9)
Total Protein ELP: 7 g/dL (ref 6.0–8.5)

## 2022-09-15 NOTE — Telephone Encounter (Signed)
Transition Care Management Follow-up Telephone Call Date of discharge and from where: 09/11/22, [facility] How have you been since you were released from the hospital? "Great!" Patient states all issues have resolved and currently working.  Any questions or concerns? No  Items Reviewed: Did the pt receive and understand the discharge instructions provided? Yes  Medications obtained and verified? No  Other? No  Any new allergies since your discharge? No  Dietary orders reviewed? No Do you have support at home? Yes   Home Care and Equipment/Supplies: Were home health services ordered? not applicable If so, what is the name of the agency? NA  Has the agency set up a time to come to the patient's home? not applicable Were any new equipment or medical supplies ordered?  No What is the name of the medical supply agency? NA Were you able to get the supplies/equipment? not applicable Do you have any questions related to the use of the equipment or supplies? No  Functional Questionnaire: (I = Independent and D = Dependent) ADLs: I  Bathing/Dressing- I  Meal Prep- I  Eating- I  Maintaining continence- I  Transferring/Ambulation- I  Managing Meds- I  Follow up appointments reviewed:  PCP Hospital f/u appt confirmed? Yes  Scheduled to see PCP on 09/18/22 @ 4p. [facility] f/u appt confirmed? No   Are transportation arrangements needed? No  If their condition worsens, is the pt aware to call PCP or go to the Emergency Dept.? Yes Was the patient provided with contact information for the PCP's office or ED? Yes Was to pt encouraged to call back with questions or concerns? Yes

## 2022-09-18 ENCOUNTER — Ambulatory Visit (HOSPITAL_BASED_OUTPATIENT_CLINIC_OR_DEPARTMENT_OTHER)
Admission: RE | Admit: 2022-09-18 | Discharge: 2022-09-18 | Disposition: A | Discharge status: Home / Self Care | Payer: 59 | Source: Ambulatory Visit | Attending: Family | Admitting: Family

## 2022-09-18 ENCOUNTER — Ambulatory Visit: Payer: 59 | Admitting: Family

## 2022-09-18 ENCOUNTER — Encounter: Payer: Self-pay | Admitting: Hematology & Oncology

## 2022-09-18 ENCOUNTER — Other Ambulatory Visit: Payer: Self-pay | Admitting: Family

## 2022-09-18 ENCOUNTER — Other Ambulatory Visit (HOSPITAL_BASED_OUTPATIENT_CLINIC_OR_DEPARTMENT_OTHER): Payer: Self-pay

## 2022-09-18 VITALS — BP 129/78 | HR 75 | Temp 98.0°F | Resp 16 | Wt 184.4 lb

## 2022-09-18 DIAGNOSIS — J189 Pneumonia, unspecified organism: Secondary | ICD-10-CM

## 2022-09-18 DIAGNOSIS — C9000 Multiple myeloma not having achieved remission: Secondary | ICD-10-CM

## 2022-09-18 DIAGNOSIS — A0811 Acute gastroenteropathy due to Norwalk agent: Secondary | ICD-10-CM

## 2022-09-18 DIAGNOSIS — Z23 Encounter for immunization: Secondary | ICD-10-CM | POA: Diagnosis not present

## 2022-09-18 DIAGNOSIS — T801XXA Vascular complications following infusion, transfusion and therapeutic injection, initial encounter: Secondary | ICD-10-CM | POA: Diagnosis not present

## 2022-09-18 DIAGNOSIS — I809 Phlebitis and thrombophlebitis of unspecified site: Secondary | ICD-10-CM

## 2022-09-18 MED ORDER — OMEGA-3-ACID ETHYL ESTERS 1 G PO CAPS
2.0000 | ORAL_CAPSULE | Freq: Two times a day (BID) | ORAL | 0 refills | Status: DC
  Filled 2022-09-18: qty 120, 30d supply, fill #0
  Filled 2022-10-02: qty 60, 15d supply, fill #0
  Filled 2022-11-19: qty 60, 15d supply, fill #1

## 2022-09-18 NOTE — Progress Notes (Signed)
Subjective:   By signing my name below, I, [name], attest that this documentation has been prepared under the direction and in the presence of Karie Chimera, NP 09/18/2022    Patient ID: Jimmy Day, male    DOB: [birth year], 61 y.o.   MRN: [id number]  No chief complaint on file.   HPI Patient is in today for a hospital follow-up  ED Admission (Syncope): He was admitted to the ED on 09/10/2022 for syncope and diarrhea. He states that he completed passed out and was kept in the hospital due to loss of fluid. Once he was hydrated, his kidney functions were normal. He notes that he did have oysters prior to the episode. However, results came back positive for the Norovirus. He was discharged on 09/11/2022. He states that as of today's visit, his symptoms are resolved. He was originally a bit "windy" but states that symptoms are improving. He reports that he has a follow up appointment   Kenn File on Right Forearm: He complains of a harden bump on his right forearm that appeared after receiving an IV in that arm.   Immunizations: He is UTD on the influenza and shingles vaccine. He states that he plans to receive the updated Covid vaccine in the near future.   Health Maintenance Due  Topic Date Due   COVID-19 Vaccine (5 - Pfizer risk series) 12/08/2021   Zoster Vaccines- Shingrix (2 of 2) 09/04/2022    Past Medical History:  Diagnosis Date   Arthritis    spine   Elevated PSA 11/29/2017   Fasting hyperglycemia    NORMAL A1c   History of COVID-19 2020   Hyperlipidemia    Hypertension    Kappa light chain myeloma ([place]) 02/02/2014   Pneumonia    during covid in 2020   Testosterone deficiency    Dr Hartley Barefoot    Past Surgical History:  Procedure Laterality Date   ANTERIOR CERVICAL DECOMP/DISCECTOMY FUSION N/A 04/30/2021   Procedure: Anterior Cervical Decompression Fusion Cervical three-four, Cervical four-five, Cervical six-seven;  Surgeon: Vallarie Mare, MD;   Location: [place];  Service: Neurosurgery;  Laterality: N/A;   CERVICAL PLATE INSERTION     POST COMPRESSION DISC INJURY   LIPOMAS REMOVAL     BENIGN FROM CHEST AND BACK    Family History  Problem Relation Age of Onset   Breast cancer Mother    Heart attack Father 39   Kidney disease Brother        RENAL FAILURE   Hypertension Brother        (died from sepsis at 81)   Heart attack Maternal Aunt 65       CABG   Stroke Maternal Uncle        CVA   Heart attack Paternal Grandmother    Diabetes Neg Hx    Colon cancer Neg Hx    Esophageal cancer Neg Hx    Rectal cancer Neg Hx    Stomach cancer Neg Hx    Colon polyps Neg Hx     Social History   Socioeconomic History   Marital status: Married    Spouse name: Not on file   Number of children: 3   Years of education: Not on file   Highest education level: Not on file  Occupational History   Occupation: Truck Education administrator: Scientist, physiological  Tobacco Use   Smoking status: Never   Smokeless tobacco: Never   Tobacco comments:  never used tobacco  Vaping Use   Vaping Use: Never used  Substance and Sexual Activity   Alcohol use: Not Currently    Comment:  rarely   Drug use: No   Sexual activity: Yes    Birth control/protection: Condom  Other Topics Concern   Not on file  Social History Narrative   Son in [employer]   Son in [place]   9 grandchildren (85 grandsons, 3 grandchildren)   Daughter at [employer] state   Married   Truck driver   Enjoys exercise, basketball, watch sports, work around the Magazine features editor in [place] Strain: Not on file  Food Insecurity: No [place] (09/10/2022)   Hunger Vital Sign    Worried About Running Out of Food in the Last Year: Never true    [place] in the Last Year: Never true  Transportation Needs: No Transportation Needs (09/10/2022)   PRAPARE - Hydrologist (Medical): No     Lack of Transportation (Non-Medical): No  Physical Activity: Not on file  Stress: Not on file  Social Connections: Not on file  Intimate Partner Violence: Not At Risk (09/10/2022)   Humiliation, Afraid, Rape, and Kick questionnaire    Fear of Current or Ex-Partner: No    Emotionally Abused: No    Physically Abused: No    Sexually Abused: No    Outpatient Medications Prior to Visit  Medication Sig Dispense Refill   Bismuth Subsalicylate (KAOPECTATE PO) Take 15 mLs by mouth daily as needed (diarrhea).     Brimonidine Tartrate (LUMIFY) 0.025 % SOLN Place 1 drop into both eyes daily.     cholecalciferol (VITAMIN D3) 25 MCG (1000 UT) tablet Take 1 tablet (1,000 Units total) by mouth daily.     losartan (COZAAR) 50 MG tablet Take 1 tablet (50 mg total) by mouth daily. 90 tablet    omega-3 acid ethyl esters (LOVAZA) 1 g capsule Take 2 capsules (2 g total) by mouth 2 (two) times daily. 120 capsule 0   tadalafil (CIALIS) 5 MG tablet Take 1 tablet by mouth everyday (Patient taking differently: Take 5 mg by mouth daily as needed for erectile dysfunction.) 90 tablet 3   tamsulosin (FLOMAX) 0.4 MG CAPS capsule Take 1 CAPSULE by mouth ONCE DAILY (Patient taking differently: Take 0.4 mg by mouth daily.) 30 capsule 10   No facility-administered medications prior to visit.    No Known Allergies  Review of Systems  Skin:        (+) Harden Bump (Right Forearm)       Objective:    Physical Exam Constitutional:      General: He is not in acute distress.    Appearance: Normal appearance. He is not ill-appearing.  HENT:     Head: Normocephalic and atraumatic.     Right Ear: External ear normal.     Left Ear: External ear normal.  Eyes:     Extraocular Movements: Extraocular movements intact.     Pupils: Pupils are equal, round, and reactive to light.  Cardiovascular:     Rate and Rhythm: Normal rate and regular rhythm.     Heart sounds: Normal heart sounds. No murmur heard.    No  gallop.  Pulmonary:     Effort: Pulmonary effort is normal. No respiratory distress.     Breath sounds: Normal breath sounds. No wheezing or rales.  Skin:  General: Skin is warm and dry.     Comments: Firm vein right forearm at former IV site  Neurological:     Mental Status: He is alert and oriented to person, place, and time.  Psychiatric:        Mood and Affect: Mood normal.        Behavior: Behavior normal.        Judgment: Judgment normal.     There were no vitals taken for this visit. Wt Readings from Last 3 Encounters:  09/10/22 200 lb (90.7 kg)  08/27/22 181 lb (82.1 kg)  08/18/22 183 lb (83 kg)       Assessment & Plan:   Problem List Items Addressed This Visit   None  No orders of the defined types were placed in this encounter.   I, [name], personally preformed the services described in this documentation.  All medical record entries made by the scribe were at my direction and in my presence.  I have reviewed the chart and discharge instructions (if applicable) and agree that the record reflects my personal performance and is accurate and complete. 09/18/2022   I,Amber Collins,acting as a scribe for Nance Pear, NP.,have documented all relevant documentation on the behalf of Nance Pear, NP,as directed by  Nance Pear, NP while in the presence of Nance Pear, NP.    [employer]

## 2022-09-19 LAB — COMPREHENSIVE METABOLIC PANEL
AG Ratio: 1.5 (calc) (ref 1.0–2.5)
ALT: 16 U/L (ref 9–46)
AST: 12 U/L (ref 10–35)
Albumin: 4 g/dL (ref 3.6–5.1)
Alkaline phosphatase (APISO): 41 U/L (ref 35–144)
BUN: 13 mg/dL (ref 7–25)
CO2: 30 mmol/L (ref 20–32)
Calcium: 9.2 mg/dL (ref 8.6–10.3)
Chloride: 102 mmol/L (ref 98–110)
Creat: 1.19 mg/dL (ref 0.70–1.35)
Globulin: 2.7 g/dL (calc) (ref 1.9–3.7)
Glucose, Bld: 71 mg/dL (ref 65–99)
Potassium: 4.2 mmol/L (ref 3.5–5.3)
Sodium: 138 mmol/L (ref 135–146)
Total Bilirubin: 0.4 mg/dL (ref 0.2–1.2)
Total Protein: 6.7 g/dL (ref 6.1–8.1)

## 2022-09-19 LAB — CBC WITH DIFFERENTIAL/PLATELET
Absolute Monocytes: 635 cells/uL (ref 200–950)
Basophils Absolute: 80 cells/uL (ref 0–200)
Basophils Relative: 1.6 %
Eosinophils Absolute: 250 cells/uL (ref 15–500)
Eosinophils Relative: 5 %
HCT: 38.5 % (ref 38.5–50.0)
Hemoglobin: 12.6 g/dL — ABNORMAL LOW (ref 13.2–17.1)
Lymphs Abs: 1705 cells/uL (ref 850–3900)
MCH: 31.6 pg (ref 27.0–33.0)
MCHC: 32.7 g/dL (ref 32.0–36.0)
MCV: 96.5 fL (ref 80.0–100.0)
MPV: 9.9 fL (ref 7.5–12.5)
Monocytes Relative: 12.7 %
Neutro Abs: 2330 cells/uL (ref 1500–7800)
Neutrophils Relative %: 46.6 %
Platelets: 300 10*3/uL (ref 140–400)
RBC: 3.99 10*6/uL — ABNORMAL LOW (ref 4.20–5.80)
RDW: 14.2 % (ref 11.0–15.0)
Total Lymphocyte: 34.1 %
WBC: 5 10*3/uL (ref 3.8–10.8)

## 2022-09-20 DIAGNOSIS — A0811 Acute gastroenteropathy due to Norwalk agent: Secondary | ICD-10-CM

## 2022-09-20 DIAGNOSIS — I809 Phlebitis and thrombophlebitis of unspecified site: Secondary | ICD-10-CM | POA: Insufficient documentation

## 2022-09-20 DIAGNOSIS — T801XXA Vascular complications following infusion, transfusion and therapeutic injection, initial encounter: Secondary | ICD-10-CM | POA: Insufficient documentation

## 2022-09-20 HISTORY — DX: Acute gastroenteropathy due to Norwalk agent: A08.11

## 2022-09-20 NOTE — Assessment & Plan Note (Signed)
Clinically resolved.  Monitor.

## 2022-09-20 NOTE — Assessment & Plan Note (Signed)
Clinically resolved. Obtain follow up CXR.

## 2022-09-20 NOTE — Assessment & Plan Note (Signed)
Encouraged pt to keep follow up as scheduled with oncology.

## 2022-09-20 NOTE — Assessment & Plan Note (Signed)
RUE, recommended warm compresses. Call if symptoms do not improve in 1-2 weeks.

## 2022-09-21 ENCOUNTER — Other Ambulatory Visit (HOSPITAL_BASED_OUTPATIENT_CLINIC_OR_DEPARTMENT_OTHER): Payer: Self-pay

## 2022-09-24 NOTE — Progress Notes (Signed)
Synopsis: Referred for recent pneumonia by [name], NP  Subjective:   PATIENT ID: Jimmy Day GENDER: male DOB: 10-30-1961, MRN: [id number]  Chief Complaint  Patient presents with   Follow-up   61yM with history of MM s/p autologous BMT 2016 on revlimid, HTN with recent admission for CAP that was slow to resolve, PCCM consulted during admission.   Had another admission 10/19-10/20 for diarrhea and fever after eating oysters but ultimately found to have norovirus.   He says he's feeling well overall. Was a bit deconditioned after pneumonia. He has no cough. No CP, fever.   Otherwise pertinent review of systems is negative.  He has no family history of lung disease  He never smoked. Played football in [place] at [employer]. 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   Past Medical History:  Diagnosis Date   Arthritis    spine   Elevated PSA 11/29/2017   Fasting hyperglycemia    NORMAL A1c   History of COVID-19 2020   Hyperlipidemia    Hypertension    Kappa light chain myeloma ([place]) 02/02/2014   Pneumonia    during covid in 2020   Testosterone deficiency    Dr Hartley Barefoot     Family History  Problem Relation Age of Onset   Breast cancer Mother    Heart attack Father 65   Kidney disease Brother        RENAL FAILURE   Hypertension Brother        (died from sepsis at 64)   Heart attack Maternal Aunt 65       CABG   Stroke Maternal Uncle        CVA   Heart attack Paternal Grandmother    Diabetes Neg Hx    Colon cancer Neg Hx    Esophageal cancer Neg Hx    Rectal cancer Neg Hx    Stomach cancer Neg Hx    Colon polyps Neg Hx      Past Surgical History:  Procedure Laterality Date   ANTERIOR CERVICAL DECOMP/DISCECTOMY FUSION N/A 04/30/2021   Procedure: Anterior Cervical Decompression Fusion Cervical three-four, Cervical four-five, Cervical six-seven;  Surgeon:  Vallarie Mare, MD;  Location: [place];  Service: Neurosurgery;  Laterality: N/A;   CERVICAL PLATE INSERTION     POST COMPRESSION DISC INJURY   LIPOMAS REMOVAL     BENIGN FROM CHEST AND BACK    Social History   Socioeconomic History   Marital status: Married    Spouse name: Not on file   Number of children: 3   Years of education: Not on file   Highest education level: Not on file  Occupational History   Occupation: Truck Education administrator: Scientist, physiological  Tobacco Use   Smoking status: Never   Smokeless tobacco: Never   Tobacco comments:    never used tobacco  Vaping Use   Vaping Use: Never used  Substance and Sexual Activity   Alcohol use: Not Currently    Comment:  rarely   Drug use: No   Sexual activity: Yes    Birth control/protection: Condom  Other Topics Concern   Not on file  Social History Narrative   Son in [place]   Son in [place]   9 grandchildren (57 grandsons, 3 grandchildren)   Daughter at [employer] state   Married   Truck driver   Enjoys exercise, basketball,  watch sports, work around the house   [employer] in [place] Strain: Not on file  Food Insecurity: No [place] (09/10/2022)   Hunger Vital Sign    Worried About Running Out of Food in the Last Year: Never true    Ran Out of Food in the Last Year: Never true  Transportation Needs: No Transportation Needs (09/10/2022)   PRAPARE - Hydrologist (Medical): No    Lack of Transportation (Non-Medical): No  Physical Activity: Not on file  Stress: Not on file  Social Connections: Not on file  Intimate Partner Violence: Not At Risk (09/10/2022)   Humiliation, Afraid, Rape, and Kick questionnaire    Fear of Current or Ex-Partner: No    Emotionally Abused: No    Physically Abused: No    Sexually Abused: No     No Known Allergies   Outpatient Medications Prior to Visit  Medication Sig Dispense  Refill   Bismuth Subsalicylate (KAOPECTATE PO) Take 15 mLs by mouth daily as needed (diarrhea).     Brimonidine Tartrate (LUMIFY) 0.025 % SOLN Place 1 drop into both eyes daily.     cholecalciferol (VITAMIN D3) 25 MCG (1000 UT) tablet Take 1 tablet (1,000 Units total) by mouth daily.     losartan (COZAAR) 50 MG tablet Take 1 tablet (50 mg total) by mouth daily. 90 tablet    omega-3 acid ethyl esters (LOVAZA) 1 g capsule Take 2 capsules (2 g total) by mouth 2 (two) times daily. 120 capsule 0   tadalafil (CIALIS) 5 MG tablet Take 1 tablet by mouth everyday (Patient taking differently: Take 5 mg by mouth daily as needed for erectile dysfunction.) 90 tablet 3   tamsulosin (FLOMAX) 0.4 MG CAPS capsule Take 1 CAPSULE by mouth ONCE DAILY (Patient taking differently: Take 0.4 mg by mouth daily.) 30 capsule 10   No facility-administered medications prior to visit.       Objective:   Physical Exam:  General appearance: 61 y.o., male, NAD, conversant  Eyes: anicteric sclerae; PERRL, tracking appropriately HENT: NCAT; MMM Neck: Trachea midline; no lymphadenopathy, no JVD Lungs: CTAB, no crackles, no wheeze, with normal respiratory effort CV: RRR, no murmur  Abdomen: Soft, non-tender; non-distended, BS present  Extremities: No peripheral edema, warm Skin: Normal turgor and texture; no rash Psych: Appropriate affect Neuro: Alert and oriented to person and place, no focal deficit     Vitals:   09/29/22 1031  BP: 124/82  Pulse: 78  SpO2: 100%  Weight: 184 lb 3.2 oz (83.6 kg)  Height: 6' (1.829 m)   100% on RA BMI Readings from Last 3 Encounters:  09/29/22 24.98 kg/m  09/18/22 25.01 kg/m  09/10/22 27.12 kg/m   Wt Readings from Last 3 Encounters:  09/29/22 184 lb 3.2 oz (83.6 kg)  09/18/22 184 lb 6.4 oz (83.6 kg)  09/10/22 200 lb (90.7 kg)     CBC    Component Value Date/Time   WBC 5.0 09/18/2022 1620   RBC 3.99 (L) 09/18/2022 1620   HGB 12.6 (L) 09/18/2022 1620   HGB  13.6 07/22/2022 1340   HGB 14.1 08/13/2017 0918   HGB 14.0 09/19/2014 1543   HCT 38.5 09/18/2022 1620   HCT 39.4 08/13/2017 0918   HCT 39.3 09/19/2014 1543   PLT 300 09/18/2022 1620   PLT 152 07/22/2022 1340   PLT 184 08/13/2017 0918   PLT 232 09/19/2014 1543  MCV 96.5 09/18/2022 1620   MCV 91 08/13/2017 0918   MCV 90.3 09/19/2014 1543   MCH 31.6 09/18/2022 1620   MCHC 32.7 09/18/2022 1620   RDW 14.2 09/18/2022 1620   RDW 13.6 08/13/2017 0918   RDW 13.7 09/19/2014 1543   LYMPHSABS 1,705 09/18/2022 1620   LYMPHSABS 1.2 08/13/2017 0918   LYMPHSABS 1.6 09/19/2014 1543   MONOABS 0.6 09/10/2022 0425   MONOABS 0.4 09/19/2014 1543   EOSABS 250 09/18/2022 1620   EOSABS 0.1 08/13/2017 0918   BASOSABS 80 09/18/2022 1620   BASOSABS 0.0 08/13/2017 0918   BASOSABS 0.1 09/19/2014 1543    Chest Imaging: CXR 09/18/22 reviewed by me with resolution of RLL pna, a little subsegmental atelectasis  Pulmonary Functions Testing Results:     No data to display         PFT 2016: Ratio 73, FEV1 95% (3.57L), TLC 114%, DLCO 92%   Echocardiogram10/17/23:    1. Left ventricular ejection fraction, by estimation, is 60 to 65%. The  left ventricle has normal function. The left ventricle has no regional  wall motion abnormalities. Left ventricular diastolic parameters are  consistent with Grade I diastolic  dysfunction (impaired relaxation). The average left ventricular global  longitudinal strain is -16.3 %. The global longitudinal strain is mildly  reduced.   2. Right ventricular systolic function is normal. The right ventricular  size is normal.   3. The mitral valve is normal in structure. Mild mitral valve  regurgitation. No evidence of mitral stenosis.   4. The aortic valve is tricuspid. Aortic valve regurgitation is not  visualized. No aortic stenosis is present.   5. The inferior vena cava is normal in size with greater than 50%  respiratory variability, suggesting right atrial  pressure of 3 mmHg.      Assessment & Plan:   # CAP Less likely consideration is some sort of pneumonitis from acute liquid asphalt exposure but seems odd that it would be lower lobe and asymmetric.    Plan: - PFTs (breathing tests) in 8 weeks - RSV vaccination today - covid-19 vaccination later in November/early December - see you in 8 weeks or sooner if need be!     [name], MD [facility] 09/29/2022 10:38 AM

## 2022-09-25 ENCOUNTER — Other Ambulatory Visit (HOSPITAL_BASED_OUTPATIENT_CLINIC_OR_DEPARTMENT_OTHER): Payer: Self-pay

## 2022-09-28 ENCOUNTER — Other Ambulatory Visit (HOSPITAL_BASED_OUTPATIENT_CLINIC_OR_DEPARTMENT_OTHER): Payer: Self-pay

## 2022-09-29 ENCOUNTER — Encounter: Payer: Self-pay | Admitting: Student

## 2022-09-29 ENCOUNTER — Ambulatory Visit: Payer: 59 | Admitting: Student

## 2022-09-29 VITALS — BP 124/82 | HR 78 | Ht 72.0 in | Wt 184.2 lb

## 2022-09-29 DIAGNOSIS — J181 Lobar pneumonia, unspecified organism: Secondary | ICD-10-CM | POA: Diagnosis not present

## 2022-09-29 NOTE — Addendum Note (Signed)
Addended by: Ronney Lion, Earnest Bailey L on: 09/29/2022 11:13 AM   Modules accepted: Orders

## 2022-09-29 NOTE — Patient Instructions (Signed)
-   PFTs (breathing tests) in 8 weeks - RSV vaccination today - covid-19 vaccination later in November/early December - see you in 8 weeks or sooner if need be!

## 2022-10-02 ENCOUNTER — Ambulatory Visit (INDEPENDENT_AMBULATORY_CARE_PROVIDER_SITE_OTHER): Payer: 59 | Admitting: *Deleted

## 2022-10-02 ENCOUNTER — Other Ambulatory Visit: Payer: Self-pay | Admitting: *Deleted

## 2022-10-02 ENCOUNTER — Encounter: Payer: Self-pay | Admitting: Hematology & Oncology

## 2022-10-02 ENCOUNTER — Other Ambulatory Visit (HOSPITAL_BASED_OUTPATIENT_CLINIC_OR_DEPARTMENT_OTHER): Payer: Self-pay

## 2022-10-02 DIAGNOSIS — E538 Deficiency of other specified B group vitamins: Secondary | ICD-10-CM | POA: Diagnosis not present

## 2022-10-02 DIAGNOSIS — C9000 Multiple myeloma not having achieved remission: Secondary | ICD-10-CM

## 2022-10-02 MED ORDER — TAMSULOSIN HCL 0.4 MG PO CAPS
ORAL_CAPSULE | ORAL | 10 refills | Status: DC
  Filled 2022-10-02: qty 30, 30d supply, fill #0
  Filled 2022-11-19: qty 30, 30d supply, fill #1
  Filled 2022-12-23 (×2): qty 30, 30d supply, fill #2
  Filled 2023-02-05: qty 90, 90d supply, fill #3
  Filled 2023-05-21: qty 30, 30d supply, fill #4
  Filled 2023-07-08: qty 30, 30d supply, fill #5
  Filled 2023-08-04: qty 30, 30d supply, fill #6

## 2022-10-02 MED ORDER — COMIRNATY 30 MCG/0.3ML IM SUSY
PREFILLED_SYRINGE | INTRAMUSCULAR | 0 refills | Status: DC
  Filled 2022-10-02: qty 0.3, 1d supply, fill #0

## 2022-10-02 MED ORDER — CYANOCOBALAMIN 1000 MCG/ML IJ SOLN
1000.0000 ug | Freq: Once | INTRAMUSCULAR | Status: AC
  Administered 2022-10-02: 1000 ug via INTRAMUSCULAR

## 2022-10-02 MED ORDER — LENALIDOMIDE 10 MG PO CAPS: 10.0000 mg | ORAL_CAPSULE | Freq: Every day | ORAL | 12 refills | Status: DC

## 2022-10-02 NOTE — Progress Notes (Signed)
Pt here for monthly B12 injection per Melissa   B12 1028mg given L deltoid IM, and pt tolerated injection well.   Next B12 injection scheduled for 1 month.

## 2022-10-05 ENCOUNTER — Encounter: Payer: Self-pay | Admitting: Hematology & Oncology

## 2022-10-05 ENCOUNTER — Other Ambulatory Visit (HOSPITAL_BASED_OUTPATIENT_CLINIC_OR_DEPARTMENT_OTHER): Payer: Self-pay

## 2022-10-05 MED ORDER — LOSARTAN POTASSIUM 50 MG PO TABS
50.0000 mg | ORAL_TABLET | Freq: Every day | ORAL | 3 refills | Status: DC
  Filled 2022-10-05 – 2022-10-19 (×2): qty 90, 90d supply, fill #0
  Filled 2023-01-14: qty 90, 90d supply, fill #1
  Filled 2023-05-21: qty 30, 30d supply, fill #2
  Filled 2023-07-08: qty 30, 30d supply, fill #3
  Filled 2023-08-04: qty 30, 30d supply, fill #4
  Filled 2023-09-24: qty 30, 30d supply, fill #5

## 2022-10-07 ENCOUNTER — Other Ambulatory Visit: Payer: Self-pay

## 2022-10-09 ENCOUNTER — Inpatient Hospital Stay: Payer: 59

## 2022-10-09 ENCOUNTER — Ambulatory Visit: Payer: 59 | Admitting: Hematology & Oncology

## 2022-10-12 ENCOUNTER — Telehealth: Payer: Self-pay | Admitting: Family

## 2022-10-12 NOTE — Telephone Encounter (Signed)
Patient reports the ringing in his ears are worse  and having trouble sleeping.  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 He mentioned getting depress.

## 2022-10-12 NOTE — Telephone Encounter (Signed)
Pt called asking if Rod Holler could give him a call when she has a minute. Pt stated that his issues with his tinnitus are getting worse and it's affecting his sleep and he is not sure what to do. Please Advise.

## 2022-10-13 ENCOUNTER — Other Ambulatory Visit (HOSPITAL_BASED_OUTPATIENT_CLINIC_OR_DEPARTMENT_OTHER): Payer: Self-pay

## 2022-10-13 ENCOUNTER — Telehealth: Payer: Self-pay

## 2022-10-13 NOTE — Telephone Encounter (Signed)
FMLA and Hospital coverage forms dropped off by patient at 11/7 appointment.  I called patient and he has returned to work full time, no restrictions.  He went back to work on 9/25 and then was hospitalized on 10/19 and went back to work on 10/21.  He asked that the form be completed for both hospital stays.  I have filled out the form and sent to Dr. Verlee Monte for signature when he is in office on 11/22.

## 2022-10-14 NOTE — Telephone Encounter (Signed)
Dr. Verlee Monte completed the hospital disability claim form and the FMLA form and I have faxed both to [employer] at fax# [phone].  Called patient and left voice message to let him know and then mailed him a copy of the forms.

## 2022-10-14 NOTE — Telephone Encounter (Signed)
Talked to patient and information given to him. He was scheduled to come in Monday to see pcp

## 2022-10-19 ENCOUNTER — Encounter: Payer: Self-pay | Admitting: Hematology & Oncology

## 2022-10-19 ENCOUNTER — Ambulatory Visit: Payer: 59 | Admitting: Family

## 2022-10-19 ENCOUNTER — Encounter: Payer: Self-pay | Admitting: Family

## 2022-10-19 ENCOUNTER — Other Ambulatory Visit (HOSPITAL_BASED_OUTPATIENT_CLINIC_OR_DEPARTMENT_OTHER): Payer: Self-pay

## 2022-10-19 VITALS — BP 152/88 | HR 77 | Temp 98.1°F | Resp 16 | Wt 192.0 lb

## 2022-10-19 DIAGNOSIS — J189 Pneumonia, unspecified organism: Secondary | ICD-10-CM | POA: Diagnosis not present

## 2022-10-19 DIAGNOSIS — I10 Essential (primary) hypertension: Secondary | ICD-10-CM | POA: Diagnosis not present

## 2022-10-19 DIAGNOSIS — F32A Depression, unspecified: Secondary | ICD-10-CM | POA: Insufficient documentation

## 2022-10-19 DIAGNOSIS — F419 Anxiety disorder, unspecified: Secondary | ICD-10-CM

## 2022-10-19 DIAGNOSIS — H9313 Tinnitus, bilateral: Secondary | ICD-10-CM

## 2022-10-19 MED ORDER — ESCITALOPRAM OXALATE 10 MG PO TABS
ORAL_TABLET | ORAL | 0 refills | Status: DC
  Filled 2022-10-19: qty 30, 30d supply, fill #0

## 2022-10-19 NOTE — Assessment & Plan Note (Signed)
BP is elevated today.  Pt is very upset today. Plan to repeat next visit. If still elevated plan medication adjustment.

## 2022-10-19 NOTE — Assessment & Plan Note (Addendum)
Severe and constant. Causing him worsening depression/anxiety symptoms.  I encouraged him to purchase a white noise machine to use at night. I also encouraged him to follow through with audiology work up/hearing aid fitting at the [place].

## 2022-10-19 NOTE — Assessment & Plan Note (Signed)
Resolved

## 2022-10-19 NOTE — Progress Notes (Addendum)
Subjective:   By signing my name below, I, Jimmy Day, attest that this documentation has been prepared under the direction and in the presence of [name], 10/19/2022.     Patient ID: Jimmy Day, male    DOB: Dec 28, 1960, 61 y.o.   MRN: [id number]  Chief Complaint  Patient presents with   Tinnitus    Complains of bilateral   Depression    Complains of increased depression symptoms due to insomnia and tinnitus    Insomnia    Complains of trouble falling and staying sleep    HPI Patient is in today for an office visit.  Tinnitus Patient is complaining of constant ear ringing in both ears, L>R. He states he has been experiencing this since the age of 61 years old. This tinnitus began following an injury acquired during his [place] service.  Patient reports that the ringing gets louder when he eats sugary foods. He explains that the [facility] told him he is losing his hearing and recommends hearing aids.  Depression Patient reports that his health status has affected his mood. He is depressed from his uncurable tinnitus and myeloma. He is willing to see a therapist for his mood.  Insomnia Patient reports that he is having trouble sleeping due to constant tinnitus.  Health Maintenance Due  Topic Date Due   COVID-19 Vaccine (5 - 2023-24 season) 07/24/2022    Past Medical History:  Diagnosis Date   Arthritis    spine   Elevated PSA 11/29/2017   Fasting hyperglycemia    NORMAL A1c   History of COVID-19 2020   Hyperlipidemia    Hypertension    Kappa light chain myeloma ([place]) 02/02/2014   Norovirus 09/20/2022   Pneumonia    during covid in 2020   Testosterone deficiency    Dr Hartley Barefoot    Past Surgical History:  Procedure Laterality Date   ANTERIOR CERVICAL DECOMP/DISCECTOMY FUSION N/A 04/30/2021   Procedure: Anterior Cervical Decompression Fusion Cervical three-four, Cervical four-five, Cervical six-seven;  Surgeon: Vallarie Mare, MD;  Location:  [facility];  Service: Neurosurgery;  Laterality: N/A;   CERVICAL PLATE INSERTION     POST COMPRESSION DISC INJURY   LIPOMAS REMOVAL     BENIGN FROM CHEST AND BACK    Family History  Problem Relation Age of Onset   Breast cancer Mother    Heart attack Father 42   Kidney disease Brother        RENAL FAILURE   Hypertension Brother        (died from sepsis at 12)   Heart attack Maternal Aunt 65       CABG   Stroke Maternal Uncle        CVA   Heart attack Paternal Grandmother    Diabetes Neg Hx    Colon cancer Neg Hx    Esophageal cancer Neg Hx    Rectal cancer Neg Hx    Stomach cancer Neg Hx    Colon polyps Neg Hx     Social History   Socioeconomic History   Marital status: Married    Spouse name: Not on file   Number of children: 3   Years of education: Not on file   Highest education level: Not on file  Occupational History   Occupation: Truck Education administrator: Scientist, physiological  Tobacco Use   Smoking status: Never   Smokeless tobacco: Never   Tobacco comments:    never used tobacco  Media planner  Vaping Use: Never used  Substance and Sexual Activity   Alcohol use: Not Currently    Comment:  rarely   Drug use: No   Sexual activity: Yes    Birth control/protection: Condom  Other Topics Concern   Not on file  Social History Narrative   Son in [place]   Son in [place]   9 grandchildren (18 grandsons, 3 grandchildren)   Daughter at [employer] state   Married   Truck driver   Enjoys exercise, basketball, watch sports, work around the Magazine features editor in [place] Strain: Not on file  Food Insecurity: No [place] (09/10/2022)   Hunger Vital Sign    Worried About Running Out of Food in the Last Year: Never true    [place] in the Last Year: Never true  Transportation Needs: No Transportation Needs (09/10/2022)   PRAPARE - Hydrologist (Medical): No    Lack of  Transportation (Non-Medical): No  Physical Activity: Not on file  Stress: Not on file  Social Connections: Not on file  Intimate Partner Violence: Not At Risk (09/10/2022)   Humiliation, Afraid, Rape, and Kick questionnaire    Fear of Current or Ex-Partner: No    Emotionally Abused: No    Physically Abused: No    Sexually Abused: No    Outpatient Medications Prior to Visit  Medication Sig Dispense Refill   Bismuth Subsalicylate (KAOPECTATE PO) Take 15 mLs by mouth daily as needed (diarrhea).     Brimonidine Tartrate (LUMIFY) 0.025 % SOLN Place 1 drop into both eyes daily.     cholecalciferol (VITAMIN D3) 25 MCG (1000 UT) tablet Take 1 tablet (1,000 Units total) by mouth daily.     COVID-19 mRNA vaccine 2023-2024 (COMIRNATY) syringe Inject into the muscle. 0.3 mL 0   lenalidomide (REVLIMID) 10 MG capsule Take 1 capsule (10 mg total) by mouth daily. TAKE 1 CAPSULE ONCE DAILY FOR 21 DAYS, THEN 7 DAYS OFF Strength: 10 mg Auth#[id number] 21 capsule 12   losartan (COZAAR) 50 MG tablet Take 1 tablet (50 mg total) by mouth daily. 90 tablet    losartan (COZAAR) 50 MG tablet Take 1 tablet by mouth daily 90 tablet 3   omega-3 acid ethyl esters (LOVAZA) 1 g capsule Take 2 capsules (2 g total) by mouth 2 (two) times daily. 120 capsule 0   tadalafil (CIALIS) 5 MG tablet Take 1 tablet by mouth everyday (Patient taking differently: Take 5 mg by mouth daily as needed for erectile dysfunction.) 90 tablet 3   tamsulosin (FLOMAX) 0.4 MG CAPS capsule Take 1 CAPSULE by mouth ONCE DAILY 30 capsule 10   No facility-administered medications prior to visit.    No Known Allergies  ROS    See HPI Objective:    Physical Exam Constitutional:      General: He is not in acute distress.    Appearance: Normal appearance. He is not ill-appearing.  HENT:     Head: Normocephalic and atraumatic.     Right Ear: External ear normal.     Left Ear: External ear normal.  Eyes:     Extraocular Movements: Extraocular  movements intact.     Pupils: Pupils are equal, round, and reactive to light.  Cardiovascular:     Rate and Rhythm: Normal rate.  Pulmonary:     Effort: Pulmonary effort is normal.  Skin:    General: Skin is  warm and dry.  Neurological:     Mental Status: He is alert and oriented to person, place, and time.  Psychiatric:        Attention and Perception: Attention normal.        Mood and Affect: Mood normal. Affect is tearful.        Speech: Speech normal.        Behavior: Behavior normal.        Judgment: Judgment normal.     BP (!) 152/88 (BP Location: Right Arm, Patient Position: Sitting, Cuff Size: Small)   Pulse 77   Temp 98.1 F (36.7 C) (Oral)   Resp 16   Wt 192 lb (87.1 kg)   SpO2 100%   BMI 26.04 kg/m  Wt Readings from Last 3 Encounters:  10/19/22 192 lb (87.1 kg)  09/29/22 184 lb 3.2 oz (83.6 kg)  09/18/22 184 lb 6.4 oz (83.6 kg)       Assessment & Plan:   Problem List Items Addressed This Visit       Unprioritized   Tinnitus, bilateral - Primary    Severe and constant. Causing him worsening depression/anxiety symptoms.  I encouraged him to purchase a white noise machine to use at night. I also encouraged him to follow through with audiology work up/hearing aid fitting at the [place].       Pneumonia of left lung due to infectious organism    Resolved.       Essential hypertension    BP is elevated today.  Pt is very upset today. Plan to repeat next visit. If still elevated plan medication adjustment.       Anxiety and depression    Severely uncontrolled.  Suspect that this is due to severe tinnitis as well recent other health concerns (pneumonia hospitalization/norovirus hospitalization, multiple myeloma hx).    He reports + suicidal ideation without plan- states that he would not take his life because he does not want to hurt his family like that.  He is agreeable to call 911 in the event that he develops a suicidal plan.   I have given him number for  [facility] to schedule an appointment with a counselor. In addition, will initiate lexapro 52m.   I instructed pt to start 1/2 tablet once daily for 1 week and then increase to a full tablet once daily on week two as tolerated.  We discussed common side effects such as nausea, drowsiness and weight gain.  Pt verbalizes understanding.  Plan follow up in 1 month to evaluate progress.          Relevant Medications   escitalopram (LEXAPRO) 10 MG tablet   30 minutes spent on today's visit. The majority of the visit was spent counseling patient on anxiety/depression/tinnitis.  Meds ordered this encounter  Medications   escitalopram (LEXAPRO) 10 MG tablet    Sig: Take 1/2 tablet by mouth once daily for 1 week, then increase to a full tab once daily on week two    Dispense:  30 tablet    Refill:  0    Order Specific Question:   Supervising Provider    Answer:   BMosie Lukes[[id number]]    I, MDebbrah Alar personally preformed the services described in this documentation.  All medical record entries made by the scribe were at my direction and in my presence.  I have reviewed the chart and discharge instructions (if applicable) and agree that the record reflects my personal performance and  is accurate and complete. 10/19/2022.   I,Verona Buck,acting as a Education administrator for [employer], NP.,have documented all relevant documentation on the behalf of Nance Pear, NP,as directed by  Nance Pear, NP while in the presence of Nance Pear, NP.    Nance Pear, NP

## 2022-10-19 NOTE — Assessment & Plan Note (Signed)
Severely uncontrolled.  Suspect that this is due to severe tinnitis as well recent other health concerns (pneumonia hospitalization/norovirus hospitalization, multiple myeloma hx).    He reports + suicidal ideation without plan- states that he would not take his life because he does not want to hurt his family like that.  He is agreeable to call 911 in the event that he develops a suicidal plan.   I have given him number for [facility] to schedule an appointment with a counselor. In addition, will initiate lexapro 67m.   I instructed pt to start 1/2 tablet once daily for 1 week and then increase to a full tablet once daily on week two as tolerated.  We discussed common side effects such as nausea, drowsiness and weight gain.  Pt verbalizes understanding.  Plan follow up in 1 month to evaluate progress.

## 2022-10-20 ENCOUNTER — Encounter: Payer: Self-pay | Admitting: Hematology & Oncology

## 2022-10-22 ENCOUNTER — Encounter: Payer: Self-pay | Admitting: Hematology & Oncology

## 2022-10-22 ENCOUNTER — Inpatient Hospital Stay: Payer: 59 | Attending: Hematology & Oncology

## 2022-10-22 ENCOUNTER — Inpatient Hospital Stay: Payer: 59

## 2022-10-22 ENCOUNTER — Other Ambulatory Visit: Payer: Self-pay

## 2022-10-22 ENCOUNTER — Inpatient Hospital Stay: Payer: 59 | Admitting: Hematology & Oncology

## 2022-10-22 VITALS — BP 135/82 | HR 72 | Temp 98.4°F | Resp 18 | Ht 72.0 in | Wt 188.0 lb

## 2022-10-22 DIAGNOSIS — C9000 Multiple myeloma not having achieved remission: Secondary | ICD-10-CM | POA: Diagnosis not present

## 2022-10-22 LAB — LACTATE DEHYDROGENASE: LDH: 133 U/L (ref 98–192)

## 2022-10-22 LAB — CBC WITH DIFFERENTIAL (CANCER CENTER ONLY)
Abs Immature Granulocytes: 0.01 10*3/uL (ref 0.00–0.07)
Basophils Absolute: 0 10*3/uL (ref 0.0–0.1)
Basophils Relative: 0 %
Eosinophils Absolute: 0.1 10*3/uL (ref 0.0–0.5)
Eosinophils Relative: 2 %
HCT: 39.6 % (ref 39.0–52.0)
Hemoglobin: 13.6 g/dL (ref 13.0–17.0)
Immature Granulocytes: 0 %
Lymphocytes Relative: 30 %
Lymphs Abs: 1.5 10*3/uL (ref 0.7–4.0)
MCH: 31.8 pg (ref 26.0–34.0)
MCHC: 34.3 g/dL (ref 30.0–36.0)
MCV: 92.5 fL (ref 80.0–100.0)
Monocytes Absolute: 0.6 10*3/uL (ref 0.1–1.0)
Monocytes Relative: 13 %
Neutro Abs: 2.7 10*3/uL (ref 1.7–7.7)
Neutrophils Relative %: 55 %
Platelet Count: 182 10*3/uL (ref 150–400)
RBC: 4.28 MIL/uL (ref 4.22–5.81)
RDW: 13.6 % (ref 11.5–15.5)
WBC Count: 4.9 10*3/uL (ref 4.0–10.5)
nRBC: 0 % (ref 0.0–0.2)

## 2022-10-22 LAB — CMP (CANCER CENTER ONLY)
ALT: 18 U/L (ref 0–44)
AST: 13 U/L — ABNORMAL LOW (ref 15–41)
Albumin: 4.5 g/dL (ref 3.5–5.0)
Alkaline Phosphatase: 41 U/L (ref 38–126)
Anion gap: 8 (ref 5–15)
BUN: 18 mg/dL (ref 8–23)
CO2: 27 mmol/L (ref 22–32)
Calcium: 9.7 mg/dL (ref 8.9–10.3)
Chloride: 102 mmol/L (ref 98–111)
Creatinine: 1.47 mg/dL — ABNORMAL HIGH (ref 0.61–1.24)
GFR, Estimated: 54 mL/min — ABNORMAL LOW (ref >60)
Glucose, Bld: 97 mg/dL (ref 70–99)
Potassium: 3.9 mmol/L (ref 3.5–5.1)
Sodium: 137 mmol/L (ref 135–145)
Total Bilirubin: 0.5 mg/dL (ref 0.3–1.2)
Total Protein: 7 g/dL (ref 6.5–8.1)

## 2022-10-22 MED ORDER — DENOSUMAB 120 MG/1.7ML ~~LOC~~ SOLN
120.0000 mg | Freq: Once | SUBCUTANEOUS | Status: AC
  Administered 2022-10-22: 120 mg via SUBCUTANEOUS
  Filled 2022-10-22: qty 1.7

## 2022-10-22 NOTE — Progress Notes (Signed)
Hematology and Oncology Follow Up Visit  Jimmy Day [facility] [id number] 1961-09-03 61 y.o. 10/22/2022   Principle Diagnosis:  IgG Kappa myeloma   Current Therapy:        Revlimid 10 mg daily (21/7) Xgeva 120 mg sq every 3 months -- next dose due 01/2023  Status post autologous stem cell transplant (April 2016)   Interim History:  Jimmy Day is here today for follow-up.  Unfortunately, he was hospitalized back in October.  He had Norovirus.  He had a lot of diarrhea.  He had syncope.  The diarrhea subsided after about 1 day or so.  He was then able to go home.  He had a nice Thanksgiving.  He is trying to go on a cruise in February.  However, he is not sure that he will be able to make that reservations.  Is not sure his wife will have time off.  As far as his myeloma goes, this has been going quite well.  His last monoclonal spike was not detected.  This was back in October.  His IgG level was 1600 mg/dL.  The Kappa light chain was 3 mg/dL.  He has had no problems with pain.  He is working.  He is pretty busy at work.  He has had no issues with the Revlimid.  He has had no problems with fever.  He has had no headache.  He has had no cough or shortness of breath.  He has had no issues with COVID.  Overall, I would say that his performance status is ECOG 0.     Medications:  Allergies as of 10/22/2022   No Known Allergies      Medication List        Accurate as of October 22, 2022  3:26 PM. If you have any questions, ask your nurse or doctor.          STOP taking these medications    tadalafil 5 MG tablet Commonly known as: CIALIS Stopped by: [name], MD       TAKE these medications    cholecalciferol 25 MCG (1000 UNIT) tablet Commonly known as: VITAMIN D3 Take 1 tablet (1,000 Units total) by mouth daily.   Comirnaty syringe Generic drug: COVID-19 mRNA vaccine 2023-2024 Inject into the muscle.   escitalopram 10 MG tablet Commonly known as:  Lexapro Take 1/2 tablet by mouth once daily for 1 week, then increase to a full tab once daily on week two   KAOPECTATE PO Take 15 mLs by mouth daily as needed (diarrhea).   lenalidomide 10 MG capsule Commonly known as: Revlimid Take 1 capsule (10 mg total) by mouth daily. TAKE 1 CAPSULE ONCE DAILY FOR 21 DAYS, THEN 7 DAYS OFF Strength: 10 mg Auth#[id number]   losartan 50 MG tablet Commonly known as: COZAAR Take 1 tablet by mouth daily What changed: Another medication with the same name was removed. Continue taking this medication, and follow the directions you see here. Changed by: [name], MD   Lumify 0.025 % Soln Generic drug: Brimonidine Tartrate Place 1 drop into both eyes daily.   omega-3 acid ethyl esters 1 g capsule Commonly known as: LOVAZA Take 2 capsules (2 g total) by mouth 2 (two) times daily.   tamsulosin 0.4 MG Caps capsule Commonly known as: FLOMAX Take 1 CAPSULE by mouth ONCE DAILY   Viagra 100 MG tablet Generic drug: sildenafil Take 50-100 mg by mouth daily as needed.  Allergies: No Known Allergies  Past Medical History, Surgical history, Social history, and Family History were reviewed and updated.  Review of Systems: Review of Systems  Constitutional: Negative.   HENT: Negative.    Eyes: Negative.   Respiratory: Negative.    Cardiovascular: Negative.   Gastrointestinal: Negative.   Genitourinary: Negative.   Musculoskeletal: Negative.   Skin: Negative.   Neurological: Negative.   Endo/Heme/Allergies: Negative.   Psychiatric/Behavioral: Negative.       Physical Exam:  height is 6' (1.829 m) and weight is 188 lb (85.3 kg). His oral temperature is 98.4 F (36.9 C). His blood pressure is 135/82 and his pulse is 72. His respiration is 18 and oxygen saturation is 100%.   Wt Readings from Last 3 Encounters:  10/22/22 188 lb (85.3 kg)  10/19/22 192 lb (87.1 kg)  09/29/22 184 lb 3.2 oz (83.6 kg)    Physical Exam Vitals  reviewed.  HENT:     Head: Normocephalic and atraumatic.  Eyes:     Pupils: Pupils are equal, round, and reactive to light.  Cardiovascular:     Rate and Rhythm: Normal rate and regular rhythm.     Heart sounds: Normal heart sounds.  Pulmonary:     Effort: Pulmonary effort is normal.     Breath sounds: Normal breath sounds.  Abdominal:     General: Bowel sounds are normal.     Palpations: Abdomen is soft.  Musculoskeletal:        General: No tenderness or deformity. Normal range of motion.     Cervical back: Normal range of motion.  Lymphadenopathy:     Cervical: No cervical adenopathy.  Skin:    General: Skin is warm and dry.     Findings: No erythema or rash.  Neurological:     Mental Status: He is alert and oriented to person, place, and time.  Psychiatric:        Behavior: Behavior normal.        Thought Content: Thought content normal.        Judgment: Judgment normal.      Lab Results  Component Value Date   WBC 4.9 10/22/2022   HGB 13.6 10/22/2022   HCT 39.6 10/22/2022   MCV 92.5 10/22/2022   PLT 182 10/22/2022   Lab Results  Component Value Date   FERRITIN 152 08/17/2014   IRON 106 08/17/2014   TIBC 253 08/17/2014   UIBC 146 08/17/2014   IRONPCTSAT 42 08/17/2014   Lab Results  Component Value Date   RBC 4.28 10/22/2022   Lab Results  Component Value Date   KPAFRELGTCHN 30.3 (H) 09/10/2022   LAMBDASER 14.7 09/10/2022   KAPLAMBRATIO 2.06 (H) 09/10/2022   Lab Results  Component Value Date   IGGSERUM 1,598 09/10/2022   IGA 22 (L) 09/10/2022   IGMSERUM 29 09/10/2022   Lab Results  Component Value Date   TOTALPROTELP 7.0 09/10/2022   ALBUMINELP 3.9 09/10/2022   A1GS 0.2 09/10/2022   A2GS 0.5 09/10/2022   BETS 0.8 09/10/2022   BETA2SER 0.2 10/04/2015   GAMS 1.5 09/10/2022   MSPIKE Not Observed 09/10/2022   SPEI Comment 09/10/2022     Chemistry      Component Value Date/Time   NA 137 10/22/2022 1414   NA 139 08/13/2017 0918   NA 142  04/01/2015 0834   K 3.9 10/22/2022 1414   K 3.6 08/13/2017 0918   K 3.4 (L) 04/01/2015 0834   CL 102 10/22/2022 1414  CL 105 08/13/2017 0918   CO2 27 10/22/2022 1414   CO2 28 08/13/2017 0918   CO2 20 (L) 04/01/2015 0834   BUN 18 10/22/2022 1414   BUN 8 08/13/2017 0918   BUN 10.4 04/01/2015 0834   CREATININE 1.47 (H) 10/22/2022 1414   CREATININE 1.19 09/18/2022 1620   CREATININE 1.2 04/01/2015 0834      Component Value Date/Time   CALCIUM 9.7 10/22/2022 1414   CALCIUM 9.1 08/13/2017 0918   CALCIUM 8.6 04/01/2015 0834   ALKPHOS 41 10/22/2022 1414   ALKPHOS 42 08/13/2017 0918   ALKPHOS 39 (L) 04/01/2015 0834   AST 13 (L) 10/22/2022 1414   AST 17 04/01/2015 0834   ALT 18 10/22/2022 1414   ALT 21 08/13/2017 0918   ALT 19 04/01/2015 0834   BILITOT 0.5 10/22/2022 1414   BILITOT 0.56 04/01/2015 0834       Impression and Plan: Jimmy Day is a pleasant 61 yo African American gentleman with IgG kappa myeloma.  He received induction therapy with RVD and then underwent an autologous stem cell transplant with [facility] in April 2016.   He is doing well on maintenance Revlimid and will continue his same regimen.   I cannot imagine that he is going to have any issues with respect to his myeloma levels.  I hate that he had this Norovirus.  We will still plan for follow-up every 3 months.  He gets his [place] today. Marland Kitchen   [name], MD 11/30/20233:26 PM

## 2022-10-22 NOTE — Patient Instructions (Signed)
Denosumab Injection (Oncology) What is this medication? DENOSUMAB (den oh SUE mab) prevents weakened bones caused by cancer. It may also be used to treat noncancerous bone tumors that cannot be removed by surgery. It can also be used to treat high calcium levels in the blood caused by cancer. It works by blocking a protein that causes bones to break down quickly. This slows down the release of calcium from bones, which lowers calcium levels in your blood. It also makes your bones stronger and less likely to break (fracture). This medicine may be used for other purposes; ask your health care provider or pharmacist if you have questions. COMMON BRAND NAME(S): XGEVA What should I tell my care team before I take this medication? They need to know if you have any of these conditions: Dental disease Having surgery or tooth extraction Infection Kidney disease Low levels of calcium or vitamin D in the blood Malnutrition On hemodialysis Skin conditions or sensitivity Thyroid or parathyroid disease An unusual reaction to denosumab, other medications, foods, dyes, or preservatives Pregnant or trying to get pregnant Breast-feeding How should I use this medication? This medication is for injection under the skin. It is given by your care team in a hospital or clinic setting. A special MedGuide will be given to you before each treatment. Be sure to read this information carefully each time. Talk to your care team about the use of this medication in children. While it may be prescribed for children as young as 13 years for selected conditions, precautions do apply. Overdosage: If you think you have taken too much of this medicine contact a poison control center or emergency room at once. NOTE: This medicine is only for you. Do not share this medicine with others. What if I miss a dose? Keep appointments for follow-up doses. It is important not to miss your dose. Call your care team if you are unable to  keep an appointment. What may interact with this medication? Do not take this medication with any of the following: Other medications containing denosumab This medication may also interact with the following: Medications that lower your chance of fighting infection Steroid medications, such as prednisone or cortisone This list may not describe all possible interactions. Give your health care provider a list of all the medicines, herbs, non-prescription drugs, or dietary supplements you use. Also tell them if you smoke, drink alcohol, or use illegal drugs. Some items may interact with your medicine. What should I watch for while using this medication? Your condition will be monitored carefully while you are receiving this medication. You may need blood work while taking this medication. This medication may increase your risk of getting an infection. Call your care team for advice if you get a fever, chills, sore throat, or other symptoms of a cold or flu. Do not treat yourself. Try to avoid being around people who are sick. You should make sure you get enough calcium and vitamin D while you are taking this medication, unless your care team tells you not to. Discuss the foods you eat and the vitamins you take with your care team. Some people who take this medication have severe bone, joint, or muscle pain. This medication may also increase your risk for jaw problems or a broken thigh bone. Tell your care team right away if you have severe pain in your jaw, bones, joints, or muscles. Tell your care team if you have any pain that does not go away or that gets worse. Talk   to your care team if you may be pregnant. Serious birth defects can occur if you take this medication during pregnancy and for 5 months after the last dose. You will need a negative pregnancy test before starting this medication. Contraception is recommended while taking this medication and for 5 months after the last dose. Your care team  can help you find the option that works for you. What side effects may I notice from receiving this medication? Side effects that you should report to your care team as soon as possible: Allergic reactions--skin rash, itching, hives, swelling of the face, lips, tongue, or throat Bone, joint, or muscle pain Low calcium level--muscle pain or cramps, confusion, tingling, or numbness in the hands or feet Osteonecrosis of the jaw--pain, swelling, or redness in the mouth, numbness of the jaw, poor healing after dental work, unusual discharge from the mouth, visible bones in the mouth Side effects that usually do not require medical attention (report to your care team if they continue or are bothersome): Cough Diarrhea Fatigue Headache Nausea This list may not describe all possible side effects. Call your doctor for medical advice about side effects. You may report side effects to FDA at 1-800-FDA-1088. Where should I keep my medication? This medication is given in a hospital or clinic. It will not be stored at home. NOTE: This sheet is a summary. It may not cover all possible information. If you have questions about this medicine, talk to your doctor, pharmacist, or health care provider.  2023 Elsevier/Gold Standard (2022-03-30 00:00:00)  

## 2022-10-23 LAB — BETA 2 MICROGLOBULIN, SERUM: Beta-2 Microglobulin: 1.7 mg/L (ref 0.6–2.4)

## 2022-10-24 LAB — IGG, IGA, IGM
IgA: 18 mg/dL — ABNORMAL LOW (ref 61–437)
IgG (Immunoglobin G), Serum: 1731 mg/dL — ABNORMAL HIGH (ref 603–1613)
IgM (Immunoglobulin M), Srm: 19 mg/dL — ABNORMAL LOW (ref 20–172)

## 2022-10-26 LAB — PROTEIN ELECTROPHORESIS, SERUM
A/G Ratio: 1.5 (ref 0.7–1.7)
Albumin ELP: 4.1 g/dL (ref 2.9–4.4)
Alpha-1-Globulin: 0.2 g/dL (ref 0.0–0.4)
Alpha-2-Globulin: 0.4 g/dL (ref 0.4–1.0)
Beta Globulin: 0.7 g/dL (ref 0.7–1.3)
Gamma Globulin: 1.6 g/dL (ref 0.4–1.8)
Globulin, Total: 2.8 g/dL (ref 2.2–3.9)
Total Protein ELP: 6.9 g/dL (ref 6.0–8.5)

## 2022-10-26 LAB — KAPPA/LAMBDA LIGHT CHAINS
Kappa free light chain: 27.8 mg/L — ABNORMAL HIGH (ref 3.3–19.4)
Kappa, lambda light chain ratio: 1.99 — ABNORMAL HIGH (ref 0.26–1.65)
Lambda free light chains: 14 mg/L (ref 5.7–26.3)

## 2022-10-29 ENCOUNTER — Other Ambulatory Visit: Payer: Self-pay | Admitting: *Deleted

## 2022-10-29 DIAGNOSIS — C9000 Multiple myeloma not having achieved remission: Secondary | ICD-10-CM

## 2022-10-29 MED ORDER — LENALIDOMIDE 10 MG PO CAPS: 10.0000 mg | ORAL_CAPSULE | Freq: Every day | ORAL | 0 refills | Status: DC

## 2022-10-30 ENCOUNTER — Ambulatory Visit (INDEPENDENT_AMBULATORY_CARE_PROVIDER_SITE_OTHER): Payer: 59

## 2022-10-30 DIAGNOSIS — E538 Deficiency of other specified B group vitamins: Secondary | ICD-10-CM | POA: Diagnosis not present

## 2022-10-30 MED ORDER — CYANOCOBALAMIN 1000 MCG/ML IJ SOLN
1000.0000 ug | Freq: Once | INTRAMUSCULAR | Status: AC
  Administered 2022-10-30: 1000 ug via INTRAMUSCULAR

## 2022-10-30 NOTE — Progress Notes (Signed)
Pt here for monthly B12 injection per Melissa  B12 1072mg given L deltoid IM, and pt tolerated injection well.  Next B12 injection scheduled for 1 month.

## 2022-10-31 ENCOUNTER — Encounter: Payer: Self-pay | Admitting: Family

## 2022-11-02 ENCOUNTER — Other Ambulatory Visit: Payer: Self-pay | Admitting: Hematology & Oncology

## 2022-11-02 DIAGNOSIS — C9000 Multiple myeloma not having achieved remission: Secondary | ICD-10-CM

## 2022-11-10 ENCOUNTER — Ambulatory Visit: Payer: 59 | Admitting: Family

## 2022-11-19 ENCOUNTER — Encounter: Payer: Self-pay | Admitting: Hematology & Oncology

## 2022-11-19 ENCOUNTER — Other Ambulatory Visit (HOSPITAL_BASED_OUTPATIENT_CLINIC_OR_DEPARTMENT_OTHER): Payer: Self-pay

## 2022-11-19 MED ORDER — TADALAFIL 5 MG PO TABS
5.0000 mg | ORAL_TABLET | Freq: Every day | ORAL | 3 refills | Status: DC
  Filled 2022-11-19: qty 90, 90d supply, fill #0
  Filled 2022-11-20: qty 30, 30d supply, fill #0
  Filled 2022-12-23 (×2): qty 30, 30d supply, fill #1
  Filled 2023-02-05: qty 90, 90d supply, fill #2
  Filled 2023-05-21: qty 30, 30d supply, fill #3
  Filled 2023-07-08: qty 30, 30d supply, fill #4
  Filled 2023-08-04: qty 30, 30d supply, fill #5
  Filled 2023-09-24: qty 30, 30d supply, fill #6
  Filled 2023-10-27 (×2): qty 90, 90d supply, fill #7

## 2022-11-20 ENCOUNTER — Other Ambulatory Visit: Payer: Self-pay

## 2022-11-20 ENCOUNTER — Encounter: Payer: Self-pay | Admitting: Hematology & Oncology

## 2022-11-20 ENCOUNTER — Other Ambulatory Visit (HOSPITAL_BASED_OUTPATIENT_CLINIC_OR_DEPARTMENT_OTHER): Payer: Self-pay

## 2022-11-22 NOTE — Progress Notes (Deleted)
Synopsis: Referred for recent pneumonia by [name], NP  Subjective:   PATIENT ID: Jimmy Day GENDER: male DOB: [birth year], MRN: [id number]  No chief complaint on file.  61yM with history of MM s/p autologous BMT 2016 on revlimid, HTN with recent admission for CAP that was slow to resolve, PCCM consulted during admission.   Had another admission 10/19-10/20 for diarrhea and fever after eating oysters but ultimately found to have norovirus.   He says he's feeling well overall. Was a bit deconditioned after pneumonia. He has no cough. No CP, fever.   He has no family history of lung disease  He never smoked. 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   Interval HPI  PFTs  Otherwise pertinent review of systems is negative.  Past Medical History:  Diagnosis Date   Arthritis    spine   Elevated PSA 11/29/2017   Fasting hyperglycemia    NORMAL A1c   History of COVID-19 2020   Hyperlipidemia    Hypertension    Kappa light chain myeloma ([place]) 02/02/2014   Norovirus 09/20/2022   Pneumonia    during covid in 2020   Testosterone deficiency    Dr Hartley Barefoot     Family History  Problem Relation Age of Onset   Breast cancer Mother    Heart attack Father 6   Kidney disease Brother        RENAL FAILURE   Hypertension Brother        (died from sepsis at 61)   Heart attack Maternal Aunt 65       CABG   Stroke Maternal Uncle        CVA   Heart attack Paternal Grandmother    Diabetes Neg Hx    Colon cancer Neg Hx    Esophageal cancer Neg Hx    Rectal cancer Neg Hx    Stomach cancer Neg Hx    Colon polyps Neg Hx      Past Surgical History:  Procedure Laterality Date   ANTERIOR CERVICAL DECOMP/DISCECTOMY FUSION N/A 04/30/2021   Procedure: Anterior Cervical Decompression Fusion Cervical three-four, Cervical four-five,  Cervical six-seven;  Surgeon: Vallarie Mare, MD;  Location: [facility];  Service: Neurosurgery;  Laterality: N/A;   CERVICAL PLATE INSERTION     POST COMPRESSION DISC INJURY   LIPOMAS REMOVAL     BENIGN FROM CHEST AND BACK    Social History   Socioeconomic History   Marital status: Married    Spouse name: Not on file   Number of children: 3   Years of education: Not on file   Highest education level: Not on file  Occupational History   Occupation: Truck Education administrator: Scientist, physiological  Tobacco Use   Smoking status: Never   Smokeless tobacco: Never   Tobacco comments:    never used tobacco  Vaping Use   Vaping Use: Never used  Substance and Sexual Activity   Alcohol use: Not Currently    Comment:  rarely   Drug use: No   Sexual activity: Yes    Birth control/protection: Condom  Other Topics Concern   Not on file  Social History Narrative   Son in [place]   Son in [place]   9 grandchildren (34 grandsons, 3 grandchildren)   Daughter at [employer] state   Married   Administrator  Enjoys exercise, basketball, watch sports, work around the house   Masters in [place] Strain: Not on file  Food Insecurity: No [place] (09/10/2022)   Hunger Vital Sign    Worried About Running Out of Food in the Last Year: Never true    [place] in the Last Year: Never true  Transportation Needs: No Transportation Needs (09/10/2022)   PRAPARE - Hydrologist (Medical): No    Lack of Transportation (Non-Medical): No  Physical Activity: Not on file  Stress: Not on file  Social Connections: Not on file  Intimate Partner Violence: Not At Risk (09/10/2022)   Humiliation, Afraid, Rape, and Kick questionnaire    Fear of Current or Ex-Partner: No    Emotionally Abused: No    Physically Abused: No    Sexually Abused: No     No Known Allergies   Outpatient Medications Prior to Visit   Medication Sig Dispense Refill   Bismuth Subsalicylate (KAOPECTATE PO) Take 15 mLs by mouth daily as needed (diarrhea).     Brimonidine Tartrate (LUMIFY) 0.025 % SOLN Place 1 drop into both eyes daily.     cholecalciferol (VITAMIN D3) 25 MCG (1000 UT) tablet Take 1 tablet (1,000 Units total) by mouth daily.     COVID-19 mRNA vaccine 2023-2024 (COMIRNATY) syringe Inject into the muscle. 0.3 mL 0   escitalopram (LEXAPRO) 10 MG tablet Take 1/2 tablet by mouth once daily for 1 week, then increase to a full tab once daily on week two (Patient not taking: Reported on 10/22/2022) 30 tablet 0   losartan (COZAAR) 50 MG tablet Take 1 tablet by mouth daily 90 tablet 3   omega-3 acid ethyl esters (LOVAZA) 1 g capsule Take 2 capsules (2 g total) by mouth 2 (two) times daily. 120 capsule 0   REVLIMID 10 MG capsule TAKE 1 CAPSULE ONCE DAILY FOR 21 DAYS, THEN 7 DAYS OFF 21 capsule 0   tadalafil (CIALIS) 5 MG tablet Take 1 tablet (5 mg total) by mouth daily. 90 tablet 3   tamsulosin (FLOMAX) 0.4 MG CAPS capsule Take 1 CAPSULE by mouth ONCE DAILY 30 capsule 10   VIAGRA 100 MG tablet Take 50-100 mg by mouth daily as needed.     No facility-administered medications prior to visit.       Objective:   Physical Exam:  General appearance: 61 y.o., male, NAD, conversant  Eyes: anicteric sclerae; PERRL, tracking appropriately HENT: NCAT; MMM Neck: Trachea midline; no lymphadenopathy, no JVD Lungs: CTAB, no crackles, no wheeze, with normal respiratory effort CV: RRR, no murmur  Abdomen: Soft, non-tender; non-distended, BS present  Extremities: No peripheral edema, warm Skin: Normal turgor and texture; no rash Psych: Appropriate affect Neuro: Alert and oriented to person and place, no focal deficit     There were no vitals filed for this visit.    on RA BMI Readings from Last 3 Encounters:  10/22/22 25.50 kg/m  10/19/22 26.04 kg/m  09/29/22 24.98 kg/m   Wt Readings from Last 3 Encounters:   10/22/22 188 lb (85.3 kg)  10/19/22 192 lb (87.1 kg)  09/29/22 184 lb 3.2 oz (83.6 kg)     CBC    Component Value Date/Time   WBC 4.9 10/22/2022 1414   WBC 5.0 09/18/2022 1620   RBC 4.28 10/22/2022 1414   HGB 13.6 10/22/2022 1414   HGB 14.1 08/13/2017 0918   HGB 14.0  09/19/2014 1543   HCT 39.6 10/22/2022 1414   HCT 39.4 08/13/2017 0918   HCT 39.3 09/19/2014 1543   PLT 182 10/22/2022 1414   PLT 184 08/13/2017 0918   PLT 232 09/19/2014 1543   MCV 92.5 10/22/2022 1414   MCV 91 08/13/2017 0918   MCV 90.3 09/19/2014 1543   MCH 31.8 10/22/2022 1414   MCHC 34.3 10/22/2022 1414   RDW 13.6 10/22/2022 1414   RDW 13.6 08/13/2017 0918   RDW 13.7 09/19/2014 1543   LYMPHSABS 1.5 10/22/2022 1414   LYMPHSABS 1.2 08/13/2017 0918   LYMPHSABS 1.6 09/19/2014 1543   MONOABS 0.6 10/22/2022 1414   MONOABS 0.4 09/19/2014 1543   EOSABS 0.1 10/22/2022 1414   EOSABS 0.1 08/13/2017 0918   BASOSABS 0.0 10/22/2022 1414   BASOSABS 0.0 08/13/2017 0918   BASOSABS 0.1 09/19/2014 1543    Chest Imaging: CXR 09/18/22 reviewed by me with resolution of RLL pna, a little subsegmental atelectasis  Pulmonary Functions Testing Results:     No data to display         PFT 2016: Ratio 73, FEV1 95% (3.57L), TLC 114%, DLCO 92%   Echocardiogram10/17/23:    1. Left ventricular ejection fraction, by estimation, is 60 to 65%. The  left ventricle has normal function. The left ventricle has no regional  wall motion abnormalities. Left ventricular diastolic parameters are  consistent with Grade I diastolic  dysfunction (impaired relaxation). The average left ventricular global  longitudinal strain is -16.3 %. The global longitudinal strain is mildly  reduced.   2. Right ventricular systolic function is normal. The right ventricular  size is normal.   3. The mitral valve is normal in structure. Mild mitral valve  regurgitation. No evidence of mitral stenosis.   4. The aortic valve is tricuspid. Aortic  valve regurgitation is not  visualized. No aortic stenosis is present.   5. The inferior vena cava is normal in size with greater than 50%  respiratory variability, suggesting right atrial pressure of 3 mmHg.      Assessment & Plan:   # CAP Less likely consideration is some sort of pneumonitis from acute liquid asphalt exposure but seems odd that it would be lower lobe and asymmetric.    Plan: - PFTs (breathing tests) in 8 weeks - RSV vaccination today - covid-19 vaccination later in November/early December - see you in 8 weeks or sooner if need be!     [name], MD [facility] 11/22/2022 7:29 PM

## 2022-11-24 ENCOUNTER — Other Ambulatory Visit (HOSPITAL_BASED_OUTPATIENT_CLINIC_OR_DEPARTMENT_OTHER): Payer: Self-pay

## 2022-11-24 ENCOUNTER — Ambulatory Visit: Payer: 59 | Admitting: Student

## 2022-11-27 ENCOUNTER — Ambulatory Visit: Payer: 59

## 2022-11-27 NOTE — Progress Notes (Deleted)
Jimmy Day is a 62 y.o. male presents to the office today for Monthly B12 injections, per physician's orders. Original order: " 06/27/22 B12 level is on low normal end. I would recommend that he restart b12 injections 1000 mg IM weekly x 4 then monthly. Sugar and cholesterol levels look great." B12 1025mg given IM, was administered ***  Deltoid (location) today. Patient tolerated injection. Patient due for follow up labs/provider appt: No.  Patient next injection due: 1 month, appt made -pending for 01/01/23  Creft, TKristine GarbeL

## 2022-12-02 ENCOUNTER — Ambulatory Visit (INDEPENDENT_AMBULATORY_CARE_PROVIDER_SITE_OTHER): Payer: 59

## 2022-12-02 DIAGNOSIS — E538 Deficiency of other specified B group vitamins: Secondary | ICD-10-CM | POA: Diagnosis not present

## 2022-12-02 MED ORDER — CYANOCOBALAMIN 1000 MCG/ML IJ SOLN
1000.0000 ug | Freq: Once | INTRAMUSCULAR | Status: AC
  Administered 2022-12-02: 1000 ug via INTRAMUSCULAR

## 2022-12-02 NOTE — Progress Notes (Signed)
Jimmy Day is a 62 y.o. male presents to the office today for B12:  injections, per physician's orders. Original order: Per [name] FNP 06/27/2022 to start once a week for 4 weeks. He is now on once a month injections.  Cyanocobalamin (med), 1000 mg/ml (dose),  IM (route) was administered Left deltoid (location) today. Patient tolerated injection. Patient due for follow up labs/provider appt: Yes, he has a follow up on 2/09. Will have the next injection during ov. Marland Kitchen

## 2022-12-04 ENCOUNTER — Other Ambulatory Visit: Payer: Self-pay | Admitting: *Deleted

## 2022-12-04 DIAGNOSIS — C9000 Multiple myeloma not having achieved remission: Secondary | ICD-10-CM

## 2022-12-04 MED ORDER — LENALIDOMIDE 10 MG PO CAPS: ORAL_CAPSULE | ORAL | 0 refills | Status: DC

## 2022-12-07 ENCOUNTER — Other Ambulatory Visit: Payer: Self-pay | Admitting: Hematology & Oncology

## 2022-12-07 DIAGNOSIS — C9000 Multiple myeloma not having achieved remission: Secondary | ICD-10-CM

## 2022-12-23 ENCOUNTER — Inpatient Hospital Stay: Payer: 59

## 2022-12-23 ENCOUNTER — Other Ambulatory Visit: Payer: Self-pay

## 2022-12-23 ENCOUNTER — Inpatient Hospital Stay (HOSPITAL_BASED_OUTPATIENT_CLINIC_OR_DEPARTMENT_OTHER): Payer: Self-pay | Admitting: Hematology & Oncology

## 2022-12-23 ENCOUNTER — Encounter (HOSPITAL_BASED_OUTPATIENT_CLINIC_OR_DEPARTMENT_OTHER): Payer: Self-pay

## 2022-12-23 ENCOUNTER — Other Ambulatory Visit: Payer: Self-pay | Admitting: Family

## 2022-12-23 ENCOUNTER — Other Ambulatory Visit (HOSPITAL_BASED_OUTPATIENT_CLINIC_OR_DEPARTMENT_OTHER): Payer: Self-pay

## 2022-12-23 ENCOUNTER — Encounter: Payer: Self-pay | Admitting: Hematology & Oncology

## 2022-12-23 ENCOUNTER — Ambulatory Visit: Payer: Self-pay | Admitting: Family

## 2022-12-23 ENCOUNTER — Inpatient Hospital Stay: Payer: 59 | Attending: Hematology & Oncology

## 2022-12-23 VITALS — BP 137/78 | HR 72 | Temp 97.8°F | Resp 20 | Ht 72.0 in | Wt 188.1 lb

## 2022-12-23 VITALS — BP 138/88 | HR 72 | Temp 98.1°F | Resp 16 | Wt 190.0 lb

## 2022-12-23 DIAGNOSIS — E538 Deficiency of other specified B group vitamins: Secondary | ICD-10-CM

## 2022-12-23 DIAGNOSIS — Z9484 Stem cells transplant status: Secondary | ICD-10-CM | POA: Insufficient documentation

## 2022-12-23 DIAGNOSIS — F32A Depression, unspecified: Secondary | ICD-10-CM

## 2022-12-23 DIAGNOSIS — C9000 Multiple myeloma not having achieved remission: Secondary | ICD-10-CM

## 2022-12-23 DIAGNOSIS — F419 Anxiety disorder, unspecified: Secondary | ICD-10-CM

## 2022-12-23 DIAGNOSIS — I10 Essential (primary) hypertension: Secondary | ICD-10-CM

## 2022-12-23 DIAGNOSIS — H9313 Tinnitus, bilateral: Secondary | ICD-10-CM

## 2022-12-23 LAB — CBC WITH DIFFERENTIAL (CANCER CENTER ONLY)
Abs Immature Granulocytes: 0.01 10*3/uL (ref 0.00–0.07)
Basophils Absolute: 0.1 10*3/uL (ref 0.0–0.1)
Basophils Relative: 2 %
Eosinophils Absolute: 0.1 10*3/uL (ref 0.0–0.5)
Eosinophils Relative: 2 %
HCT: 44.1 % (ref 39.0–52.0)
Hemoglobin: 14.8 g/dL (ref 13.0–17.0)
Immature Granulocytes: 0 %
Lymphocytes Relative: 42 %
Lymphs Abs: 1.7 10*3/uL (ref 0.7–4.0)
MCH: 31.3 pg (ref 26.0–34.0)
MCHC: 33.6 g/dL (ref 30.0–36.0)
MCV: 93.2 fL (ref 80.0–100.0)
Monocytes Absolute: 0.4 10*3/uL (ref 0.1–1.0)
Monocytes Relative: 10 %
Neutro Abs: 1.8 10*3/uL (ref 1.7–7.7)
Neutrophils Relative %: 44 %
Platelet Count: 230 10*3/uL (ref 150–400)
RBC: 4.73 MIL/uL (ref 4.22–5.81)
RDW: 13.1 % (ref 11.5–15.5)
WBC Count: 4 10*3/uL (ref 4.0–10.5)
nRBC: 0 % (ref 0.0–0.2)

## 2022-12-23 LAB — CMP (CANCER CENTER ONLY)
ALT: 15 U/L (ref 0–44)
AST: 17 U/L (ref 15–41)
Albumin: 4.6 g/dL (ref 3.5–5.0)
Alkaline Phosphatase: 30 U/L — ABNORMAL LOW (ref 38–126)
Anion gap: 7 (ref 5–15)
BUN: 16 mg/dL (ref 8–23)
CO2: 31 mmol/L (ref 22–32)
Calcium: 10.5 mg/dL — ABNORMAL HIGH (ref 8.9–10.3)
Chloride: 102 mmol/L (ref 98–111)
Creatinine: 1.27 mg/dL — ABNORMAL HIGH (ref 0.61–1.24)
GFR, Estimated: >60 mL/min (ref >60)
Glucose, Bld: 72 mg/dL (ref 70–99)
Potassium: 4.2 mmol/L (ref 3.5–5.1)
Sodium: 140 mmol/L (ref 135–145)
Total Bilirubin: 0.7 mg/dL (ref 0.3–1.2)
Total Protein: 7.2 g/dL (ref 6.5–8.1)

## 2022-12-23 LAB — LACTATE DEHYDROGENASE: LDH: 139 U/L (ref 98–192)

## 2022-12-23 LAB — VITAMIN B12: Vitamin B-12: 315 pg/mL (ref 211–911)

## 2022-12-23 MED ORDER — DENOSUMAB 120 MG/1.7ML ~~LOC~~ SOLN
120.0000 mg | Freq: Once | SUBCUTANEOUS | Status: AC
  Administered 2022-12-23: 120 mg via SUBCUTANEOUS
  Filled 2022-12-23: qty 1.7

## 2022-12-23 MED ORDER — CYANOCOBALAMIN 1000 MCG/ML IJ SOLN
1000.0000 ug | Freq: Once | INTRAMUSCULAR | Status: AC
  Administered 2022-12-23: 1000 ug via INTRAMUSCULAR

## 2022-12-23 MED ORDER — BUPROPION HCL ER (XL) 150 MG PO TB24
150.0000 mg | ORAL_TABLET | Freq: Every day | ORAL | 1 refills | Status: DC
  Filled 2022-12-23: qty 30, 30d supply, fill #0

## 2022-12-23 MED ORDER — OMEGA-3-ACID ETHYL ESTERS 1 G PO CAPS
2.0000 | ORAL_CAPSULE | Freq: Two times a day (BID) | ORAL | 1 refills | Status: DC
  Filled 2022-12-23 – 2023-01-14 (×3): qty 120, 30d supply, fill #0
  Filled 2023-02-05: qty 120, 30d supply, fill #1

## 2022-12-23 NOTE — Progress Notes (Signed)
Hematology and Oncology Follow Up Visit  Jimmy Day [facility] [id number] 26-Jul-1961 62 y.o. 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  He comes in a little bit early just because he apparently is quitting his job and is he will lose his insurance.  We last saw him back in late November.  Since then, he has been doing pretty well.  He is still taking the Revlimid.  He has had no problems with the Revlimid.  Back in October, he did have the norovirus.  He was hospitalized for this.  Otherwise, he is doing quite well.  He has had no specific complaints.  He has had no nausea or vomiting.  He has had no bony pain.  Has had no change in bowel or bladder habits.  There is been no rashes.  He has had no bleeding.  His last myeloma studies did not show a monoclonal spike.  His IgG level was 1730 mg/dL.  The Kappa light chain was 2.8 mg/dL.  He is trying to stay active.  I am sure that at some point, he probably will go out to [facility].  Overall, I would say that his performance status is probably ECOG 0.  He has had no issues with COVID.  Overall, I would say that his performance status is ECOG 0.     Medications:  Allergies as of 12/23/2022   No Known Allergies      Medication List        Accurate as of December 23, 2022 12:14 PM. If you have any questions, ask your nurse or doctor.          STOP taking these medications    Comirnaty syringe Generic drug: COVID-19 mRNA vaccine 2023-2024 Stopped by: Nance Pear, NP   escitalopram 10 MG tablet Commonly known as: Lexapro Stopped by: Nance Pear, NP   KAOPECTATE PO Stopped by: [name], MD   Lumify 0.025 % Soln Generic drug: Brimonidine Tartrate Stopped by: [name], MD       TAKE these medications    buPROPion 150 MG 24 hr tablet Commonly known as: Wellbutrin XL Take 1 tablet (150 mg total) by mouth daily. Started by: Nance Pear, NP   cholecalciferol 25 MCG (1000 UNIT) tablet Commonly known as: VITAMIN D3 Take 1 tablet (1,000 Units total) by mouth daily.   lenalidomide 10 MG capsule Commonly known as: Revlimid TAKE 1 CAPSULE ONCE DAILY FOR 21 DAYS, THEN 7 DAYS OFF   losartan 50 MG tablet Commonly known as: COZAAR Take 1 tablet by mouth daily   omega-3 acid ethyl esters 1 g capsule Commonly known as: LOVAZA Take 2 capsules (2 g total) by mouth 2 (two) times daily.   tadalafil 5 MG tablet Commonly known as: CIALIS Take 1 tablet (5 mg total) by mouth daily.   tamsulosin 0.4 MG Caps capsule Commonly known as: FLOMAX Take 1 CAPSULE by mouth ONCE DAILY   Viagra 100 MG tablet Generic drug: sildenafil Take 50-100 mg by mouth daily as needed.        Allergies: No Known Allergies  Past Medical History, Surgical history, Social history, and Family History were reviewed and updated.  Review of Systems: Review of Systems  Constitutional: Negative.   HENT: Negative.    Eyes: Negative.   Respiratory: Negative.    Cardiovascular: Negative.   Gastrointestinal: Negative.   Genitourinary: Negative.   Musculoskeletal: Negative.   Skin: Negative.   Neurological: Negative.   Endo/Heme/Allergies: Negative.   Psychiatric/Behavioral: Negative.       Physical Exam:  height is 6' (1.829 m) and weight is 188 lb 1.6 oz (85.3 kg). His oral temperature is 97.8 F (36.6 C). His blood pressure is 137/78 and his pulse is 72. His respiration is 20 and oxygen saturation is 100%.   Wt Readings from Last 3 Encounters:  12/23/22 188 lb 1.6 oz (85.3 kg)  12/23/22 190 lb (86.2 kg)  10/22/22 188 lb (85.3 kg)    Physical Exam Vitals reviewed.  HENT:     Head: Normocephalic and atraumatic.  Eyes:     Pupils: Pupils are  equal, round, and reactive to light.  Cardiovascular:     Rate and Rhythm: Normal rate and regular rhythm.     Heart sounds: Normal heart sounds.  Pulmonary:     Effort: Pulmonary effort is normal.     Breath sounds: Normal breath sounds.  Abdominal:     General: Bowel sounds are normal.     Palpations: Abdomen is soft.  Musculoskeletal:        General: No tenderness or deformity. Normal range of motion.     Cervical back: Normal range of motion.  Lymphadenopathy:     Cervical: No cervical adenopathy.  Skin:    General: Skin is warm and dry.     Findings: No erythema or rash.  Neurological:     Mental Status: He is alert and oriented to person, place, and time.  Psychiatric:        Behavior: Behavior normal.        Thought Content: Thought content normal.        Judgment: Judgment normal.     Lab Results  Component Value Date   WBC 4.0 12/23/2022   HGB 14.8 12/23/2022   HCT 44.1 12/23/2022   MCV 93.2 12/23/2022   PLT 230 12/23/2022   Lab Results  Component Value Date   FERRITIN 152 08/17/2014   IRON 106 08/17/2014   TIBC 253 08/17/2014   UIBC 146 08/17/2014   IRONPCTSAT 42 08/17/2014   Lab Results  Component Value Date   RBC 4.73 12/23/2022   Lab Results  Component Value Date   KPAFRELGTCHN 27.8 (H) 10/22/2022   LAMBDASER 14.0 10/22/2022   KAPLAMBRATIO 1.99 (H) 10/22/2022   Lab Results  Component Value Date   IGGSERUM 1,731 (H) 10/22/2022   IGA 18 (L) 10/22/2022   IGMSERUM 19 (L) 10/22/2022   Lab Results  Component Value Date   TOTALPROTELP 6.9 10/22/2022   ALBUMINELP 4.1 10/22/2022   A1GS 0.2 10/22/2022   A2GS 0.4 10/22/2022   BETS 0.7 10/22/2022   BETA2SER 0.2 10/04/2015   GAMS 1.6 10/22/2022   MSPIKE Not Observed 10/22/2022   SPEI Comment 10/22/2022     Chemistry      Component Value Date/Time   NA 140 12/23/2022 1120   NA 139 08/13/2017 0918   NA 142 04/01/2015 0834   K 4.2 12/23/2022 1120   K 3.6 08/13/2017 0918   K 3.4 (L)  04/01/2015 0834   CL 102 12/23/2022 1120   CL 105 08/13/2017 0918   CO2 31 12/23/2022 1120   CO2 28 08/13/2017 0918  CO2 20 (L) 04/01/2015 0834   BUN 16 12/23/2022 1120   BUN 8 08/13/2017 0918   BUN 10.4 04/01/2015 0834   CREATININE 1.27 (H) 12/23/2022 1120   CREATININE 1.19 09/18/2022 1620   CREATININE 1.2 04/01/2015 0834      Component Value Date/Time   CALCIUM 10.5 (H) 12/23/2022 1120   CALCIUM 9.1 08/13/2017 0918   CALCIUM 8.6 04/01/2015 0834   ALKPHOS 30 (L) 12/23/2022 1120   ALKPHOS 42 08/13/2017 0918   ALKPHOS 39 (L) 04/01/2015 0834   AST 17 12/23/2022 1120   AST 17 04/01/2015 0834   ALT 15 12/23/2022 1120   ALT 21 08/13/2017 0918   ALT 19 04/01/2015 0834   BILITOT 0.7 12/23/2022 1120   BILITOT 0.56 04/01/2015 0834       Impression and Plan: Jimmy Day is a pleasant 62 yo African American gentleman with IgG kappa myeloma.  He received induction therapy with RVD and then underwent an autologous stem cell transplant with [facility] in April 2016.   He is doing well on maintenance Revlimid and will continue his same regimen.   We will give him his Delton See today.  I do not see a problem with him getting the Xgeva.  We will plan for follow-up in 3 months.  I am sure that he will have no trouble finding a new job.     [name], MD 1/31/202412:14 PM

## 2022-12-23 NOTE — Patient Instructions (Signed)
Denosumab Injection (Oncology) What is this medication? DENOSUMAB (den oh SUE mab) prevents weakened bones caused by cancer. It may also be used to treat noncancerous bone tumors that cannot be removed by surgery. It can also be used to treat high calcium levels in the blood caused by cancer. It works by blocking a protein that causes bones to break down quickly. This slows down the release of calcium from bones, which lowers calcium levels in your blood. It also makes your bones stronger and less likely to break (fracture). This medicine may be used for other purposes; ask your health care provider or pharmacist if you have questions. COMMON BRAND NAME(S): XGEVA What should I tell my care team before I take this medication? They need to know if you have any of these conditions: Dental disease Having surgery or tooth extraction Infection Kidney disease Low levels of calcium or vitamin D in the blood Malnutrition On hemodialysis Skin conditions or sensitivity Thyroid or parathyroid disease An unusual reaction to denosumab, other medications, foods, dyes, or preservatives Pregnant or trying to get pregnant Breast-feeding How should I use this medication? This medication is for injection under the skin. It is given by your care team in a hospital or clinic setting. A special MedGuide will be given to you before each treatment. Be sure to read this information carefully each time. Talk to your care team about the use of this medication in children. While it may be prescribed for children as young as 13 years for selected conditions, precautions do apply. Overdosage: If you think you have taken too much of this medicine contact a poison control center or emergency room at once. NOTE: This medicine is only for you. Do not share this medicine with others. What if I miss a dose? Keep appointments for follow-up doses. It is important not to miss your dose. Call your care team if you are unable to  keep an appointment. What may interact with this medication? Do not take this medication with any of the following: Other medications containing denosumab This medication may also interact with the following: Medications that lower your chance of fighting infection Steroid medications, such as prednisone or cortisone This list may not describe all possible interactions. Give your health care provider a list of all the medicines, herbs, non-prescription drugs, or dietary supplements you use. Also tell them if you smoke, drink alcohol, or use illegal drugs. Some items may interact with your medicine. What should I watch for while using this medication? Your condition will be monitored carefully while you are receiving this medication. You may need blood work while taking this medication. This medication may increase your risk of getting an infection. Call your care team for advice if you get a fever, chills, sore throat, or other symptoms of a cold or flu. Do not treat yourself. Try to avoid being around people who are sick. You should make sure you get enough calcium and vitamin D while you are taking this medication, unless your care team tells you not to. Discuss the foods you eat and the vitamins you take with your care team. Some people who take this medication have severe bone, joint, or muscle pain. This medication may also increase your risk for jaw problems or a broken thigh bone. Tell your care team right away if you have severe pain in your jaw, bones, joints, or muscles. Tell your care team if you have any pain that does not go away or that gets worse. Talk  to your care team if you may be pregnant. Serious birth defects can occur if you take this medication during pregnancy and for 5 months after the last dose. You will need a negative pregnancy test before starting this medication. Contraception is recommended while taking this medication and for 5 months after the last dose. Your care team  can help you find the option that works for you. What side effects may I notice from receiving this medication? Side effects that you should report to your care team as soon as possible: Allergic reactions--skin rash, itching, hives, swelling of the face, lips, tongue, or throat Bone, joint, or muscle pain Low calcium level--muscle pain or cramps, confusion, tingling, or numbness in the hands or feet Osteonecrosis of the jaw--pain, swelling, or redness in the mouth, numbness of the jaw, poor healing after dental work, unusual discharge from the mouth, visible bones in the mouth Side effects that usually do not require medical attention (report to your care team if they continue or are bothersome): Cough Diarrhea Fatigue Headache Nausea This list may not describe all possible side effects. Call your doctor for medical advice about side effects. You may report side effects to FDA at 1-800-FDA-1088. Where should I keep my medication? This medication is given in a hospital or clinic. It will not be stored at home. NOTE: This sheet is a summary. It may not cover all possible information. If you have questions about this medicine, talk to your doctor, pharmacist, or health care provider.  2023 Elsevier/Gold Standard (2022-03-30 00:00:00)  

## 2022-12-23 NOTE — Assessment & Plan Note (Signed)
Continues b12 injections.

## 2022-12-23 NOTE — Progress Notes (Signed)
Subjective:   By signing my name below, I, Jimmy Day, attest that this documentation has been prepared under the direction and in the presence of Jimmy Alar, NP. 12/23/2022   Patient ID: Jimmy Day, male    DOB: 1961/08/08, 62 y.o.   MRN: [id number]  Chief Complaint  Patient presents with   Hypertension    Here for follow up   Tinnitus    Follow up    Hypertension   Patient is in today for a follow up visit.   Tinnitus: He has no change in his tinnitus in both ears.   Mood: He started taking lexapro since last visit for 1 day before stopping. He was worried about the sexual side effects and stopped. He is interested in taking another medication without the sexual side effects. Otherwise his mood has not improved since last visit.   Vitamin B12: He continues receiving vitamin B12 injections monthly. He is receiving one during this visit.   Blood pressure: His blood pressure is elevated during this visit. He continues taking 50 mg losartan daily PO and reports no new issues while taking it.  BP Readings from Last 3 Encounters:  12/23/22 138/88  10/22/22 135/82  10/19/22 (!) 152/88   Pulse Readings from Last 3 Encounters:  12/23/22 72  10/22/22 72  10/19/22 77    Past Medical History:  Diagnosis Date   Arthritis    spine   Elevated PSA 11/29/2017   Fasting hyperglycemia    NORMAL A1c   History of COVID-19 2020   Hyperlipidemia    Hypertension    Kappa light chain myeloma ([place]) 02/02/2014   Norovirus 09/20/2022   Pneumonia    during covid in 2020   Testosterone deficiency    Dr Hartley Barefoot    Past Surgical History:  Procedure Laterality Date   ANTERIOR CERVICAL DECOMP/DISCECTOMY FUSION N/A 04/30/2021   Procedure: Anterior Cervical Decompression Fusion Cervical three-four, Cervical four-five, Cervical six-seven;  Surgeon: Vallarie Mare, MD;  Location: [facility];  Service: Neurosurgery;  Laterality: N/A;   CERVICAL PLATE INSERTION     POST  COMPRESSION DISC INJURY   LIPOMAS REMOVAL     BENIGN FROM CHEST AND BACK    Family History  Problem Relation Age of Onset   Breast cancer Mother    Heart attack Father 49   Kidney disease Brother        RENAL FAILURE   Hypertension Brother        (died from sepsis at 57)   Heart attack Maternal Aunt 65       CABG   Stroke Maternal Uncle        CVA   Heart attack Paternal Grandmother    Diabetes Neg Hx    Colon cancer Neg Hx    Esophageal cancer Neg Hx    Rectal cancer Neg Hx    Stomach cancer Neg Hx    Colon polyps Neg Hx     Social History   Socioeconomic History   Marital status: Married    Spouse name: Not on file   Number of children: 3   Years of education: Not on file   Highest education level: Not on file  Occupational History   Occupation: Truck Education administrator: Scientist, physiological  Tobacco Use   Smoking status: Never   Smokeless tobacco: Never   Tobacco comments:    never used tobacco  Vaping Use   Vaping Use: Never used  Substance and Sexual  Activity   Alcohol use: Not Currently    Comment:  rarely   Drug use: No   Sexual activity: Yes    Birth control/protection: Condom  Other Topics Concern   Not on file  Social History Narrative   Son in [place]   Son in [place]   9 grandchildren (17 grandsons, 3 grandchildren)   Daughter at [employer] state   Married   Truck driver   Enjoys exercise, basketball, watch sports, work around [employer] in [place] Strain: Not on file  Food Insecurity: No [place] (09/10/2022)   Hunger Vital Sign    Worried About Running Out of Food in the Last Year: Never true    [place] in the Last Year: Never true  Transportation Needs: No Transportation Needs (09/10/2022)   PRAPARE - Hydrologist (Medical): No    Lack of Transportation (Non-Medical): No  Physical Activity: Not on file  Stress: Not on  file  Social Connections: Not on file  Intimate Partner Violence: Not At Risk (09/10/2022)   Humiliation, Afraid, Rape, and Kick questionnaire    Fear of Current or Ex-Partner: No    Emotionally Abused: No    Physically Abused: No    Sexually Abused: No    Outpatient Medications Prior to Visit  Medication Sig Dispense Refill   Bismuth Subsalicylate (KAOPECTATE PO) Take 15 mLs by mouth daily as needed (diarrhea).     Brimonidine Tartrate (LUMIFY) 0.025 % SOLN Place 1 drop into both eyes daily.     cholecalciferol (VITAMIN D3) 25 MCG (1000 UT) tablet Take 1 tablet (1,000 Units total) by mouth daily.     lenalidomide (REVLIMID) 10 MG capsule TAKE 1 CAPSULE ONCE DAILY FOR 21 DAYS, THEN 7 DAYS OFF 21 capsule 0   losartan (COZAAR) 50 MG tablet Take 1 tablet by mouth daily 90 tablet 3   omega-3 acid ethyl esters (LOVAZA) 1 g capsule Take 2 capsules (2 g total) by mouth 2 (two) times daily. 120 capsule 0   tadalafil (CIALIS) 5 MG tablet Take 1 tablet (5 mg total) by mouth daily. 90 tablet 3   tamsulosin (FLOMAX) 0.4 MG CAPS capsule Take 1 CAPSULE by mouth ONCE DAILY 30 capsule 10   VIAGRA 100 MG tablet Take 50-100 mg by mouth daily as needed.     COVID-19 mRNA vaccine 2023-2024 (COMIRNATY) syringe Inject into the muscle. 0.3 mL 0   escitalopram (LEXAPRO) 10 MG tablet Take 1/2 tablet by mouth once daily for 1 week, then increase to a full tab once daily on week two 30 tablet 0   No facility-administered medications prior to visit.    No Known Allergies  Review of Systems  HENT:  Positive for tinnitus.        Objective:    Physical Exam Constitutional:      General: He is not in acute distress.    Appearance: Normal appearance. He is not ill-appearing.  HENT:     Head: Normocephalic and atraumatic.     Right Ear: External ear normal.     Left Ear: External ear normal.  Eyes:     Extraocular Movements: Extraocular movements intact.     Pupils: Pupils are equal, round, and reactive  to light.  Cardiovascular:     Rate and Rhythm: Normal rate and regular rhythm.     Heart sounds: Normal heart sounds. No  murmur heard.    No gallop.     Comments: Blood pressure measured 138/88 during manual recheck. Pulmonary:     Effort: Pulmonary effort is normal. No respiratory distress.     Breath sounds: Normal breath sounds. No wheezing or rales.  Skin:    General: Skin is warm and dry.  Neurological:     Mental Status: He is alert and oriented to person, place, and time.  Psychiatric:        Judgment: Judgment normal.     BP 138/88   Pulse 72   Temp 98.1 F (36.7 C) (Oral)   Resp 16   Wt 190 lb (86.2 kg)   SpO2 100%   BMI 25.77 kg/m  Wt Readings from Last 3 Encounters:  12/23/22 190 lb (86.2 kg)  10/22/22 188 lb (85.3 kg)  10/19/22 192 lb (87.1 kg)       Assessment & Plan:  Anxiety and depression Assessment & Plan: Did not tolerate lexapro due to sleepiness and sexual side effects. Would like to try something different, rx wellbutrin.      B12 deficiency Assessment & Plan: Continues b12 injections.   Orders: -     Vitamin B12  Essential hypertension Assessment & Plan: BP Readings from Last 3 Encounters:  12/23/22 (!) 141/80  10/22/22 135/82  10/19/22 (!) 152/88   On losartan '50mg'$ . Initial bp mildly elevated.  Follow up OK. Will not change medications today.    Tinnitus, bilateral Assessment & Plan: Unchanged.  Monitor.    Other orders -     buPROPion HCl ER (XL); Take 1 tablet (150 mg total) by mouth daily.  Dispense: 30 tablet; Refill: 1    I, Nance Pear, NP, personally preformed the services described in this documentation.  All medical record entries made by the scribe were at my direction and in my presence.  I have reviewed the chart and discharge instructions (if applicable) and agree that the record reflects my personal performance and is accurate and complete. 12/23/2022   I,Jimmy Day,acting as a Education administrator for Nance Pear, NP.,have documented all relevant documentation on the behalf of Nance Pear, NP,as directed by  Nance Pear, NP while in the presence of Nance Pear, NP.   Nance Pear, NP

## 2022-12-23 NOTE — Assessment & Plan Note (Signed)
BP Readings from Last 3 Encounters:  12/23/22 (!) 141/80  10/22/22 135/82  10/19/22 (!) 152/88   On losartan '50mg'$ . Initial bp mildly elevated.  Follow up OK. Will not change medications today.

## 2022-12-23 NOTE — Assessment & Plan Note (Addendum)
Did not tolerate lexapro due to sleepiness and sexual side effects. Would like to try something different, rx wellbutrin.

## 2022-12-23 NOTE — Assessment & Plan Note (Signed)
Unchanged.  Monitor.

## 2022-12-24 ENCOUNTER — Other Ambulatory Visit (HOSPITAL_BASED_OUTPATIENT_CLINIC_OR_DEPARTMENT_OTHER): Payer: Self-pay

## 2022-12-24 LAB — KAPPA/LAMBDA LIGHT CHAINS
Kappa free light chain: 22.8 mg/L — ABNORMAL HIGH (ref 3.3–19.4)
Kappa, lambda light chain ratio: 2 — ABNORMAL HIGH (ref 0.26–1.65)
Lambda free light chains: 11.4 mg/L (ref 5.7–26.3)

## 2022-12-25 LAB — IGG, IGA, IGM
IgA: 18 mg/dL — ABNORMAL LOW (ref 61–437)
IgG (Immunoglobin G), Serum: 1780 mg/dL — ABNORMAL HIGH (ref 603–1613)
IgM (Immunoglobulin M), Srm: 16 mg/dL — ABNORMAL LOW (ref 20–172)

## 2022-12-28 LAB — PROTEIN ELECTROPHORESIS, SERUM, WITH REFLEX
A/G Ratio: 1.3 (ref 0.7–1.7)
Albumin ELP: 4 g/dL (ref 2.9–4.4)
Alpha-1-Globulin: 0.2 g/dL (ref 0.0–0.4)
Alpha-2-Globulin: 0.4 g/dL (ref 0.4–1.0)
Beta Globulin: 0.8 g/dL (ref 0.7–1.3)
Gamma Globulin: 1.6 g/dL (ref 0.4–1.8)
Globulin, Total: 3 g/dL (ref 2.2–3.9)
Total Protein ELP: 7 g/dL (ref 6.0–8.5)

## 2022-12-31 ENCOUNTER — Other Ambulatory Visit (HOSPITAL_BASED_OUTPATIENT_CLINIC_OR_DEPARTMENT_OTHER): Payer: Self-pay

## 2023-01-01 ENCOUNTER — Ambulatory Visit: Payer: 59 | Admitting: Family

## 2023-01-05 ENCOUNTER — Ambulatory Visit: Payer: 59 | Admitting: Student

## 2023-01-14 ENCOUNTER — Other Ambulatory Visit (HOSPITAL_BASED_OUTPATIENT_CLINIC_OR_DEPARTMENT_OTHER): Payer: Self-pay

## 2023-01-14 ENCOUNTER — Encounter: Payer: Self-pay | Admitting: Hematology & Oncology

## 2023-01-16 ENCOUNTER — Other Ambulatory Visit: Payer: Self-pay | Admitting: Hematology & Oncology

## 2023-01-16 DIAGNOSIS — C9000 Multiple myeloma not having achieved remission: Secondary | ICD-10-CM

## 2023-01-20 ENCOUNTER — Other Ambulatory Visit: Payer: Self-pay | Admitting: *Deleted

## 2023-01-20 DIAGNOSIS — C9000 Multiple myeloma not having achieved remission: Secondary | ICD-10-CM

## 2023-01-20 MED ORDER — LENALIDOMIDE 10 MG PO CAPS: ORAL_CAPSULE | ORAL | 0 refills | Status: DC

## 2023-01-21 ENCOUNTER — Other Ambulatory Visit: Payer: 59

## 2023-01-21 ENCOUNTER — Ambulatory Visit: Payer: 59 | Admitting: Hematology & Oncology

## 2023-01-21 ENCOUNTER — Ambulatory Visit: Payer: 59

## 2023-01-21 ENCOUNTER — Other Ambulatory Visit: Payer: Self-pay

## 2023-01-21 MED ORDER — VIAGRA 100 MG PO TABS: 50.0000 mg | ORAL_TABLET | Freq: Every day | ORAL | 1 refills | Status: DC | PRN

## 2023-02-05 ENCOUNTER — Telehealth: Payer: Self-pay | Admitting: Family

## 2023-02-05 ENCOUNTER — Ambulatory Visit: Payer: Self-pay | Admitting: Family

## 2023-02-05 ENCOUNTER — Telehealth (INDEPENDENT_AMBULATORY_CARE_PROVIDER_SITE_OTHER): Payer: Self-pay | Admitting: Family

## 2023-02-05 ENCOUNTER — Encounter: Payer: Self-pay | Admitting: Hematology & Oncology

## 2023-02-05 ENCOUNTER — Other Ambulatory Visit (HOSPITAL_BASED_OUTPATIENT_CLINIC_OR_DEPARTMENT_OTHER): Payer: Self-pay

## 2023-02-05 DIAGNOSIS — C9000 Multiple myeloma not having achieved remission: Secondary | ICD-10-CM

## 2023-02-05 DIAGNOSIS — E538 Deficiency of other specified B group vitamins: Secondary | ICD-10-CM

## 2023-02-05 DIAGNOSIS — H9313 Tinnitus, bilateral: Secondary | ICD-10-CM

## 2023-02-05 DIAGNOSIS — F419 Anxiety disorder, unspecified: Secondary | ICD-10-CM

## 2023-02-05 DIAGNOSIS — I10 Essential (primary) hypertension: Secondary | ICD-10-CM

## 2023-02-05 DIAGNOSIS — F32A Depression, unspecified: Secondary | ICD-10-CM

## 2023-02-05 MED ORDER — BUPROPION HCL ER (XL) 150 MG PO TB24
150.0000 mg | ORAL_TABLET | Freq: Every day | ORAL | 1 refills | Status: DC
  Filled 2023-02-05 (×2): qty 90, 90d supply, fill #0
  Filled 2023-05-21: qty 30, 30d supply, fill #1
  Filled 2023-07-08: qty 30, 30d supply, fill #2
  Filled 2023-08-04: qty 30, 30d supply, fill #3

## 2023-02-05 NOTE — Progress Notes (Signed)
MyChart Video Visit    Virtual Visit via Video Note   This visit type was conducted due to national recommendations for restrictions regarding the COVID-19 Pandemic (e.g. social distancing) in an effort to limit this patient's exposure and mitigate transmission in our community. This patient is at least at moderate risk for complications without adequate follow up. This format is felt to be most appropriate for this patient at this time. Physical exam was limited by quality of the video and audio technology used for the visit. CMA was able to get the patient set up on a video visit.  Patient location:  Patient and provider in visit Provider location: Office  I discussed the limitations of evaluation and management by telemedicine and the availability of in person appointments. The patient expressed understanding and agreed to proceed.  Visit Date: 02/05/2023.  Today's healthcare provider: Nance Pear, NP     Subjective:    Patient ID: Jimmy Day, male    DOB: Apr 03, 1961, 62 y.o.   MRN: [id number]  Chief Complaint  Patient presents with   Depression    Follow up "doing much better on the new medication"     HPI Patient is in today for a telehealth video visit. Due to technical difficulties I was unable to see the patient by video but we continued the visit with audio.  Mood:  At his last visit 11/2022 he was started on bupropion 150 mg daily. He was wary of the medication at first, so he had split his tablets in half and took one half in the morning and the other half at night. He denies any side effects of the bupropion. Today he states he is feeling a lot better. He notes that he is more frequently able to push his thoughts and tinnitus symptoms to the background.   Tinnitus:  Currently this is active, but subtle. He does have random moments where his tinnitus becomes louder and more intense, triggering stronger emotions. The last episode like this was Wednesday  afternoon. Of note, he is going to pursue hearing aids. Recently he was approved through the [place].  Hypertension:  He reports a recent blood pressure of 135/83 at home, taken last week. BP Readings from Last 3 Encounters:  12/23/22 137/78  12/23/22 138/88  10/22/22 135/82     Past Medical History:  Diagnosis Date   Arthritis    spine   Elevated PSA 11/29/2017   Fasting hyperglycemia    NORMAL A1c   History of COVID-19 2020   Hyperlipidemia    Hypertension    Kappa light chain myeloma ([place]) 02/02/2014   Norovirus 09/20/2022   Pneumonia    during covid in 2020   Testosterone deficiency    Dr Hartley Barefoot    Past Surgical History:  Procedure Laterality Date   ANTERIOR CERVICAL DECOMP/DISCECTOMY FUSION N/A 04/30/2021   Procedure: Anterior Cervical Decompression Fusion Cervical three-four, Cervical four-five, Cervical six-seven;  Surgeon: Vallarie Mare, MD;  Location: [facility];  Service: Neurosurgery;  Laterality: N/A;   CERVICAL PLATE INSERTION     POST COMPRESSION DISC INJURY   LIPOMAS REMOVAL     BENIGN FROM CHEST AND BACK    Family History  Problem Relation Age of Onset   Breast cancer Mother    Heart attack Father 22   Kidney disease Brother        RENAL FAILURE   Hypertension Brother        (died from sepsis at 48)  Heart attack Maternal Aunt 65       CABG   Stroke Maternal Uncle        CVA   Heart attack Paternal Grandmother    Diabetes Neg Hx    Colon cancer Neg Hx    Esophageal cancer Neg Hx    Rectal cancer Neg Hx    Stomach cancer Neg Hx    Colon polyps Neg Hx     Social History   Socioeconomic History   Marital status: Married    Spouse name: Not on file   Number of children: 3   Years of education: Not on file   Highest education level: Not on file  Occupational History   Occupation: Truck Education administrator: Scientist, physiological  Tobacco Use   Smoking status: Never   Smokeless tobacco: Never   Tobacco comments:    never used tobacco  Vaping  Use   Vaping Use: Never used  Substance and Sexual Activity   Alcohol use: Not Currently    Comment:  rarely   Drug use: No   Sexual activity: Yes    Birth control/protection: Condom  Other Topics Concern   Not on file  Social History Narrative   Son in [place]   Son in [place]   9 grandchildren (72 grandsons, 3 grandchildren)   Daughter at [employer] state   Married   Truck driver   Enjoys exercise, basketball, watch sports, work around the Magazine features editor in [place] Strain: Not on file  Food Insecurity: No Food Insecurity (09/10/2022)   Hunger Vital Sign    Worried About Running Out of Food in the Last Year: Never true    [place] in the Last Year: Never true  Transportation Needs: No Transportation Needs (09/10/2022)   PRAPARE - Hydrologist (Medical): No    Lack of Transportation (Non-Medical): No  Physical Activity: Not on file  Stress: Not on file  Social Connections: Not on file  Intimate Partner Violence: Not At Risk (09/10/2022)   Humiliation, Afraid, Rape, and Kick questionnaire    Fear of Current or Ex-Partner: No    Emotionally Abused: No    Physically Abused: No    Sexually Abused: No    Outpatient Medications Prior to Visit  Medication Sig Dispense Refill   cholecalciferol (VITAMIN D3) 25 MCG (1000 UT) tablet Take 1 tablet (1,000 Units total) by mouth daily.     lenalidomide (REVLIMID) 10 MG capsule TAKE 1 CAPSULE ONCE DAILY FOR 21 DAYS THEN 7 DAYS OFF [id number] 21 capsule 0   losartan (COZAAR) 50 MG tablet Take 1 tablet by mouth daily 90 tablet 3   omega-3 acid ethyl esters (LOVAZA) 1 g capsule Take 2 capsules (2 g total) by mouth 2 (two) times daily. 120 capsule 1   tadalafil (CIALIS) 5 MG tablet Take 1 tablet (5 mg total) by mouth daily. 90 tablet 3   tamsulosin (FLOMAX) 0.4 MG CAPS capsule Take 1 CAPSULE by mouth ONCE DAILY 30 capsule 10   VIAGRA 100  MG tablet Take 0.5 tablets (50 mg total) by mouth daily as needed (take 1/2 tablet by mouth daily as needed for erectile dysfunction). 10 tablet 1   buPROPion (WELLBUTRIN XL) 150 MG 24 hr tablet Take 1 tablet (150 mg total) by mouth daily. 30 tablet 1   No facility-administered medications prior to visit.  No Known Allergies  Review of Systems  HENT:  Positive for tinnitus.        Objective:    Physical Exam: Gen: Awake, alert, no acute distress. Resp: Breathing is even and non-labored. Psych: calm/pleasant demeanor. Neuro: Alert and Oriented x3, + facial symmetry, speech is clear.   There were no vitals taken for this visit. Wt Readings from Last 3 Encounters:  12/23/22 188 lb 1.6 oz (85.3 kg)  12/23/22 190 lb (86.2 kg)  10/22/22 188 lb (85.3 kg)    Diabetic Foot Exam - Simple   No data filed    Lab Results  Component Value Date   WBC 4.0 12/23/2022   HGB 14.8 12/23/2022   HCT 44.1 12/23/2022   PLT 230 12/23/2022   GLUCOSE 72 12/23/2022   CHOL 167 06/26/2022   TRIG 102.0 06/26/2022   HDL 45.50 06/26/2022   LDLDIRECT 93.0 11/26/2017   LDLCALC 101 (H) 06/26/2022   ALT 15 12/23/2022   AST 17 12/23/2022   NA 140 12/23/2022   K 4.2 12/23/2022   CL 102 12/23/2022   CREATININE 1.27 (H) 12/23/2022   BUN 16 12/23/2022   CO2 31 12/23/2022   TSH 1.101 09/10/2022   PSA 4.90 (H) 08/09/2019   INR 1.13 01/29/2014   HGBA1C 5.6 06/26/2022    Lab Results  Component Value Date   TSH 1.101 09/10/2022   Lab Results  Component Value Date   WBC 4.0 12/23/2022   HGB 14.8 12/23/2022   HCT 44.1 12/23/2022   MCV 93.2 12/23/2022   PLT 230 12/23/2022   Lab Results  Component Value Date   NA 140 12/23/2022   K 4.2 12/23/2022   CHLORIDE 109 04/01/2015   CO2 31 12/23/2022   GLUCOSE 72 12/23/2022   BUN 16 12/23/2022   CREATININE 1.27 (H) 12/23/2022   BILITOT 0.7 12/23/2022   ALKPHOS 30 (L) 12/23/2022   AST 17 12/23/2022   ALT 15 12/23/2022   PROT 7.2 12/23/2022    ALBUMIN 4.6 12/23/2022   CALCIUM 10.5 (H) 12/23/2022   ANIONGAP 7 12/23/2022   EGFR 82 (L) 04/01/2015   GFR 59.84 (L) 08/18/2022   Lab Results  Component Value Date   CHOL 167 06/26/2022   Lab Results  Component Value Date   HDL 45.50 06/26/2022   Lab Results  Component Value Date   LDLCALC 101 (H) 06/26/2022   Lab Results  Component Value Date   TRIG 102.0 06/26/2022   Lab Results  Component Value Date   CHOLHDL 4 06/26/2022   Lab Results  Component Value Date   HGBA1C 5.6 06/26/2022       Assessment & Plan:   Problem List Items Addressed This Visit   None    Meds ordered this encounter  Medications   buPROPion (WELLBUTRIN XL) 150 MG 24 hr tablet    Sig: Take 1 tablet (150 mg total) by mouth daily.    Dispense:  90 tablet    Refill:  1    Order Specific Question:   Supervising Provider    Answer:   Penni Homans A [[id number]]    I discussed the assessment and treatment plan with the patient. The patient was provided an opportunity to ask questions and all were answered. The patient agreed with the plan and demonstrated an understanding of the instructions.   The patient was advised to call back or seek an in-person evaluation if the symptoms worsen or if the condition fails to improve as anticipated.  I provided *** minutes of face-to-face time during this encounter.  I,Mathew Stumpf,acting as a Education administrator for [employer], NP.,have documented all relevant documentation on the behalf of Nance Pear, NP,as directed by  Nance Pear, NP while in the presence of Nance Pear, NP.  I, Madelin Rear, personally preformed the services described in this documentation.  All medical record entries made by the scribe were at my direction and in my presence.  I have reviewed the chart and discharge instructions (if applicable) and agree that the record reflects my personal performance and is accurate and complete. 02/05/2023.  Nance Pear, NP [facility] (phone) [phone] (fax)  [place]

## 2023-02-05 NOTE — Telephone Encounter (Signed)
Please contact pt to schedule b12 injection with nurse.

## 2023-02-05 NOTE — Telephone Encounter (Signed)
Also needs a 3 month follow up in person with me.

## 2023-02-07 ENCOUNTER — Telehealth: Payer: Self-pay | Admitting: Family

## 2023-02-07 NOTE — Assessment & Plan Note (Signed)
He will be fitted for hearing aids through the [place].  Notes that overall his tinnitus is more tolerable.

## 2023-02-07 NOTE — Assessment & Plan Note (Signed)
Stable, following with heme/onc every 3 months.

## 2023-02-07 NOTE — Assessment & Plan Note (Signed)
Patient's mood is much improved on wellbutrin without side effects.  Continue same.

## 2023-02-07 NOTE — Assessment & Plan Note (Signed)
Resume monthly b12 injections.

## 2023-02-07 NOTE — Telephone Encounter (Signed)
Please contact pt to schedule monthly b12 injection and 6 month follow up with me.

## 2023-02-07 NOTE — Assessment & Plan Note (Signed)
Per patient report it is at goal.  Looked good back in the end of January in our system. Continue losartan.

## 2023-02-08 NOTE — Telephone Encounter (Signed)
Called patient and scheduled for both

## 2023-02-09 NOTE — Telephone Encounter (Signed)
Lvm to sched.  

## 2023-02-11 ENCOUNTER — Ambulatory Visit (INDEPENDENT_AMBULATORY_CARE_PROVIDER_SITE_OTHER): Payer: Self-pay | Admitting: *Deleted

## 2023-02-11 DIAGNOSIS — E538 Deficiency of other specified B group vitamins: Secondary | ICD-10-CM

## 2023-02-11 MED ORDER — CYANOCOBALAMIN 1000 MCG/ML IJ SOLN
1000.0000 ug | Freq: Once | INTRAMUSCULAR | Status: AC
  Administered 2023-02-11: 1000 ug via INTRAMUSCULAR

## 2023-02-11 NOTE — Progress Notes (Signed)
Patient here for monthly b12 injection per physicians order.  Injection given in left deltoid and patient tolerated well.  

## 2023-02-25 ENCOUNTER — Ambulatory Visit: Payer: 59 | Admitting: Student

## 2023-02-26 ENCOUNTER — Telehealth: Payer: Self-pay

## 2023-02-26 ENCOUNTER — Other Ambulatory Visit (HOSPITAL_COMMUNITY): Payer: Self-pay

## 2023-02-26 ENCOUNTER — Encounter: Payer: Self-pay | Admitting: Hematology & Oncology

## 2023-02-26 ENCOUNTER — Other Ambulatory Visit: Payer: Self-pay | Admitting: *Deleted

## 2023-02-26 DIAGNOSIS — C9000 Multiple myeloma not having achieved remission: Secondary | ICD-10-CM

## 2023-02-26 MED ORDER — LENALIDOMIDE 10 MG PO CAPS: ORAL_CAPSULE | ORAL | 0 refills | Status: DC

## 2023-02-26 NOTE — Telephone Encounter (Signed)
Oral Oncology Patient Advocate Encounter  Prior Authorization for Lenalidomide has been approved.    PA# [id number]  Effective dates: 02/26/23 through 02/26/24  Patient must fill through [facility].    Ardeen Fillers, CPhT Oncology Pharmacy Patient Advocate  [facility]  [phone] (phone) [phone] (fax) 02/26/2023 2:48 PM

## 2023-02-26 NOTE — Telephone Encounter (Signed)
Oral Oncology Patient Advocate Encounter  Change in insurance   Received notification that prior authorization for Lenalidomide is required.   PA submitted on 02/26/23 verbally to [facility]  Reference# [id number]  Status is pending     Ardeen Fillers, CPhT Oncology Pharmacy Patient Advocate  [facility]  [phone] (phone) [phone] (fax) 02/26/2023 2:46 PM

## 2023-03-01 ENCOUNTER — Other Ambulatory Visit (HOSPITAL_COMMUNITY): Payer: Self-pay

## 2023-03-01 NOTE — Telephone Encounter (Signed)
Script has been e-scribed to [employer] and all supporting documents have been sent to allow processing of prescription.    Ardeen Fillers, CPhT Oncology Pharmacy Patient Advocate  [facility]  [phone] (phone) [phone] (fax) 03/01/2023 7:56 AM

## 2023-03-19 ENCOUNTER — Other Ambulatory Visit: Payer: Self-pay | Admitting: Family

## 2023-03-19 ENCOUNTER — Encounter: Payer: Self-pay | Admitting: Hematology & Oncology

## 2023-03-19 ENCOUNTER — Ambulatory Visit (INDEPENDENT_AMBULATORY_CARE_PROVIDER_SITE_OTHER): Payer: 59

## 2023-03-19 ENCOUNTER — Other Ambulatory Visit (HOSPITAL_BASED_OUTPATIENT_CLINIC_OR_DEPARTMENT_OTHER): Payer: Self-pay

## 2023-03-19 DIAGNOSIS — E538 Deficiency of other specified B group vitamins: Secondary | ICD-10-CM | POA: Diagnosis not present

## 2023-03-19 MED ORDER — OMEGA-3-ACID ETHYL ESTERS 1 G PO CAPS
2.0000 | ORAL_CAPSULE | Freq: Two times a day (BID) | ORAL | 5 refills | Status: DC
  Filled 2023-03-19: qty 120, 30d supply, fill #0
  Filled 2023-04-02: qty 60, 15d supply, fill #0
  Filled 2023-04-16: qty 120, 30d supply, fill #1
  Filled 2023-05-21: qty 120, 30d supply, fill #2
  Filled 2023-07-08: qty 120, 30d supply, fill #3
  Filled 2023-08-04: qty 120, 30d supply, fill #4
  Filled 2023-09-24: qty 120, 30d supply, fill #5
  Filled 2024-01-14: qty 60, 15d supply, fill #6

## 2023-03-19 MED ORDER — CYANOCOBALAMIN 1000 MCG/ML IJ SOLN
1000.0000 ug | Freq: Once | INTRAMUSCULAR | Status: AC
  Administered 2023-03-19: 1000 ug via INTRAMUSCULAR

## 2023-03-19 NOTE — Progress Notes (Signed)
Patient here for monthly b12 injection per physicians order.  Injection given in left deltoid and patient tolerated well.  

## 2023-03-22 ENCOUNTER — Ambulatory Visit: Payer: Self-pay | Admitting: Hematology & Oncology

## 2023-03-22 ENCOUNTER — Inpatient Hospital Stay: Payer: Self-pay

## 2023-03-22 ENCOUNTER — Ambulatory Visit: Payer: Self-pay

## 2023-03-25 ENCOUNTER — Ambulatory Visit: Payer: Self-pay

## 2023-03-25 ENCOUNTER — Other Ambulatory Visit: Payer: Self-pay

## 2023-03-25 ENCOUNTER — Ambulatory Visit: Payer: Self-pay | Admitting: Hematology & Oncology

## 2023-03-25 ENCOUNTER — Other Ambulatory Visit: Payer: Self-pay | Admitting: *Deleted

## 2023-03-25 DIAGNOSIS — C9000 Multiple myeloma not having achieved remission: Secondary | ICD-10-CM

## 2023-03-25 MED ORDER — LENALIDOMIDE 10 MG PO CAPS: ORAL_CAPSULE | ORAL | 0 refills | Status: DC

## 2023-03-30 ENCOUNTER — Other Ambulatory Visit: Payer: Self-pay

## 2023-03-30 DIAGNOSIS — C9000 Multiple myeloma not having achieved remission: Secondary | ICD-10-CM

## 2023-03-30 MED ORDER — MAGNESIUM SULFATE 2 GM/50ML IV SOLN: 2.0000 g | Freq: Once | INTRAVENOUS | Status: DC

## 2023-03-30 NOTE — Addendum Note (Signed)
Addended by: Mardi Mainland on: 03/30/2023 11:23 AM   Modules accepted: Orders

## 2023-04-01 ENCOUNTER — Other Ambulatory Visit (HOSPITAL_BASED_OUTPATIENT_CLINIC_OR_DEPARTMENT_OTHER): Payer: Self-pay

## 2023-04-02 ENCOUNTER — Encounter: Payer: Self-pay | Admitting: Hematology & Oncology

## 2023-04-02 ENCOUNTER — Inpatient Hospital Stay (HOSPITAL_BASED_OUTPATIENT_CLINIC_OR_DEPARTMENT_OTHER): Payer: 59 | Admitting: Hematology & Oncology

## 2023-04-02 ENCOUNTER — Inpatient Hospital Stay: Payer: 59

## 2023-04-02 ENCOUNTER — Other Ambulatory Visit: Payer: Self-pay

## 2023-04-02 ENCOUNTER — Inpatient Hospital Stay: Payer: 59 | Attending: Hematology & Oncology

## 2023-04-02 ENCOUNTER — Other Ambulatory Visit (HOSPITAL_BASED_OUTPATIENT_CLINIC_OR_DEPARTMENT_OTHER): Payer: Self-pay

## 2023-04-02 VITALS — BP 123/77 | HR 54 | Temp 97.7°F | Resp 16 | Ht 72.0 in | Wt 191.0 lb

## 2023-04-02 DIAGNOSIS — R109 Unspecified abdominal pain: Secondary | ICD-10-CM | POA: Insufficient documentation

## 2023-04-02 DIAGNOSIS — C9000 Multiple myeloma not having achieved remission: Secondary | ICD-10-CM

## 2023-04-02 DIAGNOSIS — R300 Dysuria: Secondary | ICD-10-CM

## 2023-04-02 LAB — CMP (CANCER CENTER ONLY)
ALT: 14 U/L (ref 0–44)
AST: 17 U/L (ref 15–41)
Albumin: 4.3 g/dL (ref 3.5–5.0)
Alkaline Phosphatase: 29 U/L — ABNORMAL LOW (ref 38–126)
Anion gap: 7 (ref 5–15)
BUN: 15 mg/dL (ref 8–23)
CO2: 30 mmol/L (ref 22–32)
Calcium: 9.7 mg/dL (ref 8.9–10.3)
Chloride: 103 mmol/L (ref 98–111)
Creatinine: 1.35 mg/dL — ABNORMAL HIGH (ref 0.61–1.24)
GFR, Estimated: 59 mL/min — ABNORMAL LOW (ref >60)
Glucose, Bld: 105 mg/dL — ABNORMAL HIGH (ref 70–99)
Potassium: 4 mmol/L (ref 3.5–5.1)
Sodium: 140 mmol/L (ref 135–145)
Total Bilirubin: 0.8 mg/dL (ref 0.3–1.2)
Total Protein: 7.2 g/dL (ref 6.5–8.1)

## 2023-04-02 LAB — CBC WITH DIFFERENTIAL (CANCER CENTER ONLY)
Abs Immature Granulocytes: 0.01 10*3/uL (ref 0.00–0.07)
Basophils Absolute: 0 10*3/uL (ref 0.0–0.1)
Basophils Relative: 1 %
Eosinophils Absolute: 0.2 10*3/uL (ref 0.0–0.5)
Eosinophils Relative: 6 %
HCT: 39.3 % (ref 39.0–52.0)
Hemoglobin: 13.6 g/dL (ref 13.0–17.0)
Immature Granulocytes: 0 %
Lymphocytes Relative: 34 %
Lymphs Abs: 1.3 10*3/uL (ref 0.7–4.0)
MCH: 32.3 pg (ref 26.0–34.0)
MCHC: 34.6 g/dL (ref 30.0–36.0)
MCV: 93.3 fL (ref 80.0–100.0)
Monocytes Absolute: 0.5 10*3/uL (ref 0.1–1.0)
Monocytes Relative: 12 %
Neutro Abs: 1.9 10*3/uL (ref 1.7–7.7)
Neutrophils Relative %: 47 %
Platelet Count: 200 10*3/uL (ref 150–400)
RBC: 4.21 MIL/uL — ABNORMAL LOW (ref 4.22–5.81)
RDW: 13.2 % (ref 11.5–15.5)
WBC Count: 3.9 10*3/uL — ABNORMAL LOW (ref 4.0–10.5)
nRBC: 0 % (ref 0.0–0.2)

## 2023-04-02 LAB — URINALYSIS, COMPLETE (UACMP) WITH MICROSCOPIC
Bilirubin Urine: NEGATIVE
Glucose, UA: NEGATIVE mg/dL
Hgb urine dipstick: NEGATIVE
Ketones, ur: NEGATIVE mg/dL
Leukocytes,Ua: NEGATIVE
Nitrite: NEGATIVE
Protein, ur: NEGATIVE mg/dL
Specific Gravity, Urine: 1.015 (ref 1.005–1.030)
pH: 7 (ref 5.0–8.0)

## 2023-04-02 LAB — LACTATE DEHYDROGENASE: LDH: 154 U/L (ref 98–192)

## 2023-04-02 MED ORDER — DENOSUMAB 120 MG/1.7ML ~~LOC~~ SOLN
120.0000 mg | Freq: Once | SUBCUTANEOUS | Status: AC
  Administered 2023-04-02: 120 mg via SUBCUTANEOUS
  Filled 2023-04-02: qty 1.7

## 2023-04-02 NOTE — Patient Instructions (Signed)
Denosumab Injection (Oncology) What is this medication? DENOSUMAB (den oh SUE mab) prevents weakened bones caused by cancer. It may also be used to treat noncancerous bone tumors that cannot be removed by surgery. It can also be used to treat high calcium levels in the blood caused by cancer. It works by blocking a protein that causes bones to break down quickly. This slows down the release of calcium from bones, which lowers calcium levels in your blood. It also makes your bones stronger and less likely to break (fracture). This medicine may be used for other purposes; ask your health care provider or pharmacist if you have questions. COMMON BRAND NAME(S): XGEVA What should I tell my care team before I take this medication? They need to know if you have any of these conditions: Dental disease Having surgery or tooth extraction Infection Kidney disease Low levels of calcium or vitamin D in the blood Malnutrition On hemodialysis Skin conditions or sensitivity Thyroid or parathyroid disease An unusual reaction to denosumab, other medications, foods, dyes, or preservatives Pregnant or trying to get pregnant Breast-feeding How should I use this medication? This medication is for injection under the skin. It is given by your care team in a hospital or clinic setting. A special MedGuide will be given to you before each treatment. Be sure to read this information carefully each time. Talk to your care team about the use of this medication in children. While it may be prescribed for children as young as 13 years for selected conditions, precautions do apply. Overdosage: If you think you have taken too much of this medicine contact a poison control center or emergency room at once. NOTE: This medicine is only for you. Do not share this medicine with others. What if I miss a dose? Keep appointments for follow-up doses. It is important not to miss your dose. Call your care team if you are unable to  keep an appointment. What may interact with this medication? Do not take this medication with any of the following: Other medications containing denosumab This medication may also interact with the following: Medications that lower your chance of fighting infection Steroid medications, such as prednisone or cortisone This list may not describe all possible interactions. Give your health care provider a list of all the medicines, herbs, non-prescription drugs, or dietary supplements you use. Also tell them if you smoke, drink alcohol, or use illegal drugs. Some items may interact with your medicine. What should I watch for while using this medication? Your condition will be monitored carefully while you are receiving this medication. You may need blood work while taking this medication. This medication may increase your risk of getting an infection. Call your care team for advice if you get a fever, chills, sore throat, or other symptoms of a cold or flu. Do not treat yourself. Try to avoid being around people who are sick. You should make sure you get enough calcium and vitamin D while you are taking this medication, unless your care team tells you not to. Discuss the foods you eat and the vitamins you take with your care team. Some people who take this medication have severe bone, joint, or muscle pain. This medication may also increase your risk for jaw problems or a broken thigh bone. Tell your care team right away if you have severe pain in your jaw, bones, joints, or muscles. Tell your care team if you have any pain that does not go away or that gets worse. Talk   to your care team if you may be pregnant. Serious birth defects can occur if you take this medication during pregnancy and for 5 months after the last dose. You will need a negative pregnancy test before starting this medication. Contraception is recommended while taking this medication and for 5 months after the last dose. Your care team  can help you find the option that works for you. What side effects may I notice from receiving this medication? Side effects that you should report to your care team as soon as possible: Allergic reactions--skin rash, itching, hives, swelling of the face, lips, tongue, or throat Bone, joint, or muscle pain Low calcium level--muscle pain or cramps, confusion, tingling, or numbness in the hands or feet Osteonecrosis of the jaw--pain, swelling, or redness in the mouth, numbness of the jaw, poor healing after dental work, unusual discharge from the mouth, visible bones in the mouth Side effects that usually do not require medical attention (report to your care team if they continue or are bothersome): Cough Diarrhea Fatigue Headache Nausea This list may not describe all possible side effects. Call your doctor for medical advice about side effects. You may report side effects to FDA at 1-800-FDA-1088. Where should I keep my medication? This medication is given in a hospital or clinic. It will not be stored at home. NOTE: This sheet is a summary. It may not cover all possible information. If you have questions about this medicine, talk to your doctor, pharmacist, or health care provider.  2023 Elsevier/Gold Standard (2022-03-30 00:00:00)  

## 2023-04-02 NOTE — Progress Notes (Signed)
Hematology and Oncology Follow Up Visit  [name] [facility] [id number] 1961-11-05 62 y.o. 04/02/2023   Principle Diagnosis:  IgG Kappa myeloma   Current Therapy:        Revlimid 10 mg daily (21/7) Xgeva 120 mg sq every 3 months -- next dose due 06/2023  Status post autologous stem cell transplant (April 2016)   Interim History:  Jimmy Day is here today for follow-up.  He is doing quite well.  I remember back in October, he has a Neuro virus.  He certainly has gone through this.  He is complaining of some pain in the right flank.  We did do a urinalysis on him today.  This did not show any abnormality.  There is no blood.  There is no leukocyte esterase.    Will probably get a ultrasound to see if there is anything going on with his kidneys.    He continues on Revlimid.  He is doing well on the Revlimid.  When we last saw him back in January, there was no monoclonal spike in his blood.  His IgG level was 1780 mg/dL.  The Kappa light chain was 2.3 mg/dL.  He has had no problems with fever.  There is been no cough or shortness of breath.  He has had no nausea or vomiting.  He has had no leg swelling.  He has had no rashes.  He has had no obvious change in bowel or bladder habits.  There is been no hematuria.  He has had no dysuria.  There is no urinary frequency.  Overall, I would say that his performance status is probably ECOG 0.      Medications:  Allergies as of 04/02/2023   No Known Allergies      Medication List        Accurate as of Apr 02, 2023  2:44 PM. If you have any questions, ask your nurse or doctor.          buPROPion 150 MG 24 hr tablet Commonly known as: Wellbutrin XL Take 1 tablet (150 mg total) by mouth daily.   cholecalciferol 25 MCG (1000 UNIT) tablet Commonly known as: VITAMIN D3 Take 1 tablet (1,000 Units total) by mouth daily.   lenalidomide 10 MG capsule Commonly known as: Revlimid TAKE 1 CAPSULE ONCE DAILY FOR 21 DAYS THEN 7 DAYS OFF  Auth#[id number]   losartan 50 MG tablet Commonly known as: COZAAR Take 1 tablet by mouth daily   omega-3 acid ethyl esters 1 g capsule Commonly known as: LOVAZA Take 2 capsules (2 g total) by mouth 2 (two) times daily.   tadalafil 5 MG tablet Commonly known as: CIALIS Take 1 tablet (5 mg total) by mouth daily.   tamsulosin 0.4 MG Caps capsule Commonly known as: FLOMAX Take 1 CAPSULE by mouth ONCE DAILY   Viagra 100 MG tablet Generic drug: sildenafil Take 0.5 tablets (50 mg total) by mouth daily as needed (take 1/2 tablet by mouth daily as needed for erectile dysfunction).        Allergies: No Known Allergies  Past Medical History, Surgical history, Social history, and Family History were reviewed and updated.  Review of Systems: Review of Systems  Constitutional: Negative.   HENT: Negative.    Eyes: Negative.   Respiratory: Negative.    Cardiovascular: Negative.   Gastrointestinal: Negative.   Genitourinary: Negative.   Musculoskeletal: Negative.   Skin: Negative.   Neurological: Negative.   Endo/Heme/Allergies: Negative.   Psychiatric/Behavioral: Negative.  Physical Exam:  height is 6' (1.829 m) and weight is 191 lb (86.6 kg). His oral temperature is 97.7 F (36.5 C). His blood pressure is 123/77 and his pulse is 54 (abnormal). His respiration is 16 and oxygen saturation is 100%.   Wt Readings from Last 3 Encounters:  04/02/23 191 lb (86.6 kg)  12/23/22 188 lb 1.6 oz (85.3 kg)  12/23/22 190 lb (86.2 kg)    Physical Exam Vitals reviewed.  HENT:     Head: Normocephalic and atraumatic.  Eyes:     Pupils: Pupils are equal, round, and reactive to light.  Cardiovascular:     Rate and Rhythm: Normal rate and regular rhythm.     Heart sounds: Normal heart sounds.  Pulmonary:     Effort: Pulmonary effort is normal.     Breath sounds: Normal breath sounds.  Abdominal:     General: Bowel sounds are normal.     Palpations: Abdomen is soft.   Musculoskeletal:        General: No tenderness or deformity. Normal range of motion.     Cervical back: Normal range of motion.  Lymphadenopathy:     Cervical: No cervical adenopathy.  Skin:    General: Skin is warm and dry.     Findings: No erythema or rash.  Neurological:     Mental Status: He is alert and oriented to person, place, and time.  Psychiatric:        Behavior: Behavior normal.        Thought Content: Thought content normal.        Judgment: Judgment normal.      Lab Results  Component Value Date   WBC 3.9 (L) 04/02/2023   HGB 13.6 04/02/2023   HCT 39.3 04/02/2023   MCV 93.3 04/02/2023   PLT 200 04/02/2023   Lab Results  Component Value Date   FERRITIN 152 08/17/2014   IRON 106 08/17/2014   TIBC 253 08/17/2014   UIBC 146 08/17/2014   IRONPCTSAT 42 08/17/2014   Lab Results  Component Value Date   RBC 4.21 (L) 04/02/2023   Lab Results  Component Value Date   KPAFRELGTCHN 22.8 (H) 12/23/2022   LAMBDASER 11.4 12/23/2022   KAPLAMBRATIO 2.00 (H) 12/23/2022   Lab Results  Component Value Date   IGGSERUM 1,780 (H) 12/23/2022   IGA 18 (L) 12/23/2022   IGMSERUM 16 (L) 12/23/2022   Lab Results  Component Value Date   TOTALPROTELP 7.0 12/23/2022   ALBUMINELP 4.0 12/23/2022   A1GS 0.2 12/23/2022   A2GS 0.4 12/23/2022   BETS 0.8 12/23/2022   BETA2SER 0.2 10/04/2015   GAMS 1.6 12/23/2022   MSPIKE Not Observed 12/23/2022   SPEI Comment 10/22/2022     Chemistry      Component Value Date/Time   NA 140 04/02/2023 0825   NA 139 08/13/2017 0918   NA 142 04/01/2015 0834   K 4.0 04/02/2023 0825   K 3.6 08/13/2017 0918   K 3.4 (L) 04/01/2015 0834   CL 103 04/02/2023 0825   CL 105 08/13/2017 0918   CO2 30 04/02/2023 0825   CO2 28 08/13/2017 0918   CO2 20 (L) 04/01/2015 0834   BUN 15 04/02/2023 0825   BUN 8 08/13/2017 0918   BUN 10.4 04/01/2015 0834   CREATININE 1.35 (H) 04/02/2023 0825   CREATININE 1.19 09/18/2022 1620   CREATININE 1.2  04/01/2015 0834      Component Value Date/Time   CALCIUM 9.7 04/02/2023 0825  CALCIUM 9.1 08/13/2017 0918   CALCIUM 8.6 04/01/2015 0834   ALKPHOS 29 (L) 04/02/2023 0825   ALKPHOS 42 08/13/2017 0918   ALKPHOS 39 (L) 04/01/2015 0834   AST 17 04/02/2023 0825   AST 17 04/01/2015 0834   ALT 14 04/02/2023 0825   ALT 21 08/13/2017 0918   ALT 19 04/01/2015 0834   BILITOT 0.8 04/02/2023 0825   BILITOT 0.56 04/01/2015 0834       Impression and Plan: Mr. Herington is a pleasant 62 yo African American gentleman with IgG kappa myeloma.  He received induction therapy with RVD and then underwent an autologous stem cell transplant with [facility] in April 2016.   He is doing well on maintenance Revlimid and will continue his same regimen.   We will give him his Rivka Barbara today.  I do not see a problem with him getting the Xgeva.  We will have to see what the ultrasound shows.  I will still plan to see him back in 3 months.    Josph Macho, MD 5/10/20242:44 PM

## 2023-04-03 LAB — IGG, IGA, IGM
IgA: 17 mg/dL — ABNORMAL LOW (ref 61–437)
IgG (Immunoglobin G), Serum: 1734 mg/dL — ABNORMAL HIGH (ref 603–1613)
IgM (Immunoglobulin M), Srm: 13 mg/dL — ABNORMAL LOW (ref 20–172)

## 2023-04-05 LAB — KAPPA/LAMBDA LIGHT CHAINS
Kappa free light chain: 29.9 mg/L — ABNORMAL HIGH (ref 3.3–19.4)
Kappa, lambda light chain ratio: 1.86 — ABNORMAL HIGH (ref 0.26–1.65)
Lambda free light chains: 16.1 mg/L (ref 5.7–26.3)

## 2023-04-07 ENCOUNTER — Other Ambulatory Visit: Payer: Self-pay | Admitting: *Deleted

## 2023-04-07 LAB — PROTEIN ELECTROPHORESIS, SERUM, WITH REFLEX
A/G Ratio: 1.2 (ref 0.7–1.7)
Albumin ELP: 3.8 g/dL (ref 2.9–4.4)
Alpha-1-Globulin: 0.2 g/dL (ref 0.0–0.4)
Alpha-2-Globulin: 0.5 g/dL (ref 0.4–1.0)
Beta Globulin: 0.8 g/dL (ref 0.7–1.3)
Gamma Globulin: 1.6 g/dL (ref 0.4–1.8)
Globulin, Total: 3.1 g/dL (ref 2.2–3.9)
Total Protein ELP: 6.9 g/dL (ref 6.0–8.5)

## 2023-04-07 MED ORDER — VIAGRA 100 MG PO TABS: 50.0000 mg | ORAL_TABLET | Freq: Every day | ORAL | 1 refills | Status: AC | PRN

## 2023-04-16 ENCOUNTER — Ambulatory Visit (INDEPENDENT_AMBULATORY_CARE_PROVIDER_SITE_OTHER): Payer: 59

## 2023-04-16 ENCOUNTER — Other Ambulatory Visit (HOSPITAL_BASED_OUTPATIENT_CLINIC_OR_DEPARTMENT_OTHER): Payer: Self-pay

## 2023-04-16 DIAGNOSIS — E538 Deficiency of other specified B group vitamins: Secondary | ICD-10-CM | POA: Diagnosis not present

## 2023-04-16 MED ORDER — CYANOCOBALAMIN 1000 MCG/ML IJ SOLN
1000.0000 ug | Freq: Once | INTRAMUSCULAR | Status: AC
Start: 2023-04-16 — End: 2023-04-16
  Administered 2023-04-16: 1000 ug via INTRAMUSCULAR

## 2023-04-16 NOTE — Progress Notes (Signed)
Patient here for monthly b12 injection per physicians order.  Injection given in left deltoid and patient tolerated well.  

## 2023-05-07 ENCOUNTER — Other Ambulatory Visit: Payer: Self-pay | Admitting: *Deleted

## 2023-05-07 DIAGNOSIS — C9000 Multiple myeloma not having achieved remission: Secondary | ICD-10-CM

## 2023-05-07 MED ORDER — LENALIDOMIDE 10 MG PO CAPS
ORAL_CAPSULE | ORAL | 0 refills | Status: DC
Start: 2023-05-07 — End: 2023-06-06

## 2023-05-21 ENCOUNTER — Ambulatory Visit (INDEPENDENT_AMBULATORY_CARE_PROVIDER_SITE_OTHER): Payer: 59

## 2023-05-21 ENCOUNTER — Other Ambulatory Visit (HOSPITAL_BASED_OUTPATIENT_CLINIC_OR_DEPARTMENT_OTHER): Payer: Self-pay

## 2023-05-21 DIAGNOSIS — E538 Deficiency of other specified B group vitamins: Secondary | ICD-10-CM

## 2023-05-21 MED ORDER — CYANOCOBALAMIN 1000 MCG/ML IJ SOLN
1000.0000 ug | Freq: Once | INTRAMUSCULAR | Status: AC
Start: 2023-05-21 — End: 2023-05-21
  Administered 2023-05-21: 1000 ug via INTRAMUSCULAR

## 2023-05-21 NOTE — Progress Notes (Signed)
Patient here for monthly b12 injection per physicians order.  Injection given in left deltoid and patient tolerated well.  

## 2023-05-25 ENCOUNTER — Ambulatory Visit (HOSPITAL_COMMUNITY)
Admission: RE | Admit: 2023-05-25 | Discharge: 2023-05-25 | Disposition: A | Discharge status: Home / Self Care | Payer: 59 | Source: Ambulatory Visit | Attending: Hematology & Oncology | Admitting: Hematology & Oncology

## 2023-05-25 DIAGNOSIS — R300 Dysuria: Secondary | ICD-10-CM | POA: Insufficient documentation

## 2023-06-06 ENCOUNTER — Other Ambulatory Visit: Payer: Self-pay | Admitting: Hematology & Oncology

## 2023-06-06 DIAGNOSIS — C9000 Multiple myeloma not having achieved remission: Secondary | ICD-10-CM

## 2023-06-07 ENCOUNTER — Other Ambulatory Visit: Payer: Self-pay | Admitting: *Deleted

## 2023-06-07 DIAGNOSIS — C9000 Multiple myeloma not having achieved remission: Secondary | ICD-10-CM

## 2023-06-07 MED ORDER — LENALIDOMIDE 10 MG PO CAPS
ORAL_CAPSULE | ORAL | 0 refills | Status: DC
Start: 2023-06-07 — End: 2023-07-22

## 2023-06-07 NOTE — Telephone Encounter (Signed)
Call from [facility] regarding recent Rx refill sent in without Auth#.  Survey completed and Rx reordered with new Auth#

## 2023-06-23 ENCOUNTER — Encounter (INDEPENDENT_AMBULATORY_CARE_PROVIDER_SITE_OTHER): Payer: Self-pay

## 2023-07-01 ENCOUNTER — Inpatient Hospital Stay: Payer: 59 | Admitting: Hematology & Oncology

## 2023-07-01 ENCOUNTER — Inpatient Hospital Stay: Payer: 59

## 2023-07-08 ENCOUNTER — Encounter (INDEPENDENT_AMBULATORY_CARE_PROVIDER_SITE_OTHER): Payer: Self-pay

## 2023-07-08 ENCOUNTER — Other Ambulatory Visit (HOSPITAL_BASED_OUTPATIENT_CLINIC_OR_DEPARTMENT_OTHER): Payer: Self-pay

## 2023-07-08 ENCOUNTER — Inpatient Hospital Stay: Payer: 59 | Admitting: Hematology & Oncology

## 2023-07-08 ENCOUNTER — Inpatient Hospital Stay: Payer: 59 | Attending: Hematology & Oncology

## 2023-07-08 ENCOUNTER — Inpatient Hospital Stay: Payer: 59

## 2023-07-22 ENCOUNTER — Other Ambulatory Visit: Payer: Self-pay | Admitting: *Deleted

## 2023-07-22 DIAGNOSIS — C9000 Multiple myeloma not having achieved remission: Secondary | ICD-10-CM

## 2023-07-22 MED ORDER — LENALIDOMIDE 10 MG PO CAPS
ORAL_CAPSULE | ORAL | 0 refills | Status: DC
Start: 2023-07-22 — End: 2023-08-10

## 2023-07-23 ENCOUNTER — Ambulatory Visit: Payer: 59

## 2023-07-23 NOTE — Progress Notes (Deleted)
Jimmy Day is a 62 y.o. male presents to the office today for Monthly B12 injection, per physician's orders. Original order: "02/05/23: "Resume monthly b12 injections. " Cyanocobalamin 1000 mg/ml IM was administered *** deltoid today. Patient tolerated injection. Patient due for follow up labs/provider appt: Yes. Date due: Pt has a pending apt on 08/10/23. Patient next injection due: 1 month, appt made {yes/no:[id number]}  Creft, Melton Alar L

## 2023-07-28 ENCOUNTER — Telehealth: Payer: Self-pay | Admitting: Hematology & Oncology

## 2023-07-28 NOTE — Telephone Encounter (Signed)
Called patient to attemept to get rescheduled for missed appointment on 08/15. Patient did not answer and there is no voicemail box set up. Will attempt to reach the patient again.

## 2023-08-04 ENCOUNTER — Other Ambulatory Visit (HOSPITAL_BASED_OUTPATIENT_CLINIC_OR_DEPARTMENT_OTHER): Payer: Self-pay

## 2023-08-04 ENCOUNTER — Ambulatory Visit (INDEPENDENT_AMBULATORY_CARE_PROVIDER_SITE_OTHER): Payer: 59

## 2023-08-04 DIAGNOSIS — E538 Deficiency of other specified B group vitamins: Secondary | ICD-10-CM

## 2023-08-04 MED ORDER — CYANOCOBALAMIN 1000 MCG/ML IJ SOLN
1000.0000 ug | Freq: Once | INTRAMUSCULAR | Status: AC
Start: 2023-08-04 — End: 2023-08-04
  Administered 2023-08-04: 1000 ug via INTRAMUSCULAR

## 2023-08-04 NOTE — Progress Notes (Signed)
Pt here for monthly B12 injection per PCP  B12 given IM L deltoid, and pt tolerated injection well.  Next B12 injection scheduled for 09/03/2023.

## 2023-08-10 ENCOUNTER — Encounter: Payer: Self-pay | Admitting: Family

## 2023-08-10 ENCOUNTER — Ambulatory Visit: Payer: 59 | Admitting: Family

## 2023-08-10 VITALS — BP 125/75 | HR 70 | Temp 98.2°F | Resp 16 | Wt 190.0 lb

## 2023-08-10 DIAGNOSIS — Z0000 Encounter for general adult medical examination without abnormal findings: Secondary | ICD-10-CM | POA: Diagnosis not present

## 2023-08-10 DIAGNOSIS — E538 Deficiency of other specified B group vitamins: Secondary | ICD-10-CM

## 2023-08-10 DIAGNOSIS — E785 Hyperlipidemia, unspecified: Secondary | ICD-10-CM | POA: Diagnosis not present

## 2023-08-10 DIAGNOSIS — N529 Male erectile dysfunction, unspecified: Secondary | ICD-10-CM

## 2023-08-10 DIAGNOSIS — R972 Elevated prostate specific antigen [PSA]: Secondary | ICD-10-CM

## 2023-08-10 DIAGNOSIS — C9000 Multiple myeloma not having achieved remission: Secondary | ICD-10-CM

## 2023-08-10 DIAGNOSIS — I10 Essential (primary) hypertension: Secondary | ICD-10-CM

## 2023-08-10 DIAGNOSIS — Z23 Encounter for immunization: Secondary | ICD-10-CM

## 2023-08-10 DIAGNOSIS — E559 Vitamin D deficiency, unspecified: Secondary | ICD-10-CM

## 2023-08-10 DIAGNOSIS — N1831 Chronic kidney disease, stage 3a: Secondary | ICD-10-CM

## 2023-08-10 DIAGNOSIS — H9313 Tinnitus, bilateral: Secondary | ICD-10-CM

## 2023-08-10 NOTE — Progress Notes (Unsigned)
Subjective:     Patient ID: Jimmy Day, male    DOB: [birth year], 62 y.o.   MRN: [id number]  Chief Complaint  Patient presents with   Hyperlipidemia    Here for follow up   Anxiety    Here for follow up   Depression    Here for follow up    Hyperlipidemia Pertinent negatives include no myalgias.  Anxiety Symptoms include nervous/anxious behavior.    Depression        Associated symptoms include headaches (associated with tinnitis).  Associated symptoms include no myalgias.  Past medical history includes anxiety.     Discussed the use of AI scribe software for clinical note transcription with the patient, who gave verbal consent to proceed.  History of Present Illness   The patient, with a history of depression, tinnitus, and cancer, presents with worsening depression and fatigue related to ongoing cancer treatments. Despite this, he expresses a desire to discontinue his cancer treatments due to exhaustion and frustration.  The patient's tinnitus is causing significant distress, with constant noise that is not alleviated by white noise or other distractions. He is scheduled to receive a hearing aid, which he hopes will be a Corporate treasurer."  The patient also reports difficulty sleeping, which he attributes to his tinnitus. He describes a sensation of "something coming down" in his vision, which he believes to be a floater.  The patient is also dealing with an elevated PSA and is followed by urology. He has a history of an enlarged prostate, as revealed by a previous ultrasound.  The patient is currently taking Viagra and Cialis for erectile dysfunction, using both as needed. He also takes Revlimid, a medication for cancer treatment.          Health Maintenance Due  Topic Date Due   INFLUENZA VACCINE  06/24/2023   COVID-19 Vaccine (5 - 2023-24 season) 07/25/2023    Past Medical History:  Diagnosis Date   Arthritis    spine   Elevated PSA 11/29/2017   Fasting  hyperglycemia    NORMAL A1c   History of COVID-19 2020   Hyperlipidemia    Hypertension    Kappa light chain myeloma (HCC) 02/02/2014   Norovirus 09/20/2022   Pneumonia    during covid in 2020   Testosterone deficiency    Dr Marcello Fennel    Past Surgical History:  Procedure Laterality Date   ANTERIOR CERVICAL DECOMP/DISCECTOMY FUSION N/A 04/30/2021   Procedure: Anterior Cervical Decompression Fusion Cervical three-four, Cervical four-five, Cervical six-seven;  Surgeon: Jimmy Person, MD;  Location: [facility] OR;  Service: Neurosurgery;  Laterality: N/A;   CERVICAL PLATE INSERTION     POST COMPRESSION DISC INJURY   LIPOMAS REMOVAL     BENIGN FROM CHEST AND BACK    Family History  Problem Relation Age of Onset   Breast cancer Mother    Heart attack Father 56   Kidney disease Brother        RENAL FAILURE   Hypertension Brother        (died from sepsis at 33)   Heart attack Maternal Aunt 54       CABG   Stroke Maternal Uncle        CVA   Heart attack Paternal Grandmother    Diabetes Neg Hx    Colon cancer Neg Hx    Esophageal cancer Neg Hx    Rectal cancer Neg Hx    Stomach cancer Neg Hx    Colon polyps  Neg Hx     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  Tobacco Use   Smoking status: Never   Smokeless tobacco: Never   Tobacco comments:    never used tobacco  Vaping Use   Vaping status: Never Used  Substance and Sexual Activity   Alcohol use: Not Currently    Comment:  rarely   Drug use: No   Sexual activity: Yes    Birth control/protection: Condom  Other Topics Concern   Not on file  Social History Narrative   Son in [place]   Son in [place]   9 grandchildren (6 grandsons, 3 grandchildren)   Daughter at [employer] state   Married    Truck driver   Enjoys exercise, basketball, watch sports, 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, dispensing   Social Determinants of Corporate investment banker Strain: Not on file  Food Insecurity: No Food Insecurity (09/10/2022)   Hunger Vital Sign    Worried About Running Out of Food in the Last Year: Never true    Ran Out of Food in the Last Year: Never true  Transportation Needs: No Transportation Needs (09/10/2022)   PRAPARE - Administrator, Civil Service (Medical): No    Lack of Transportation (Non-Medical): No  Physical Activity: Not on file  Stress: Not on file  Social Connections: Unknown (04/05/2022)   Received from [facility]   Social Network    Social Network: Not on file  Intimate Partner Violence: Not At Risk (09/10/2022)   Humiliation, Afraid, Rape, and Kick questionnaire    Fear of Current or Ex-Partner: No    Emotionally Abused: No    Physically Abused: No    Sexually Abused: No    Outpatient Medications Prior to Visit  Medication Sig Dispense Refill   buPROPion (WELLBUTRIN XL) 150 MG 24 hr tablet Take 1 tablet (150 mg total) by mouth daily. 90 tablet 1   cholecalciferol (VITAMIN D3) 25 MCG (1000 UT) tablet Take 1 tablet (1,000 Units total) by mouth daily.     losartan (COZAAR) 50 MG tablet Take 1 tablet by mouth daily 90 tablet 3   omega-3 acid ethyl esters (LOVAZA) 1 g capsule Take 2 capsules (2 g total) by mouth 2 (two) times daily. 120 capsule 5   tadalafil (CIALIS) 5 MG tablet Take 1 tablet (5 mg total) by mouth daily. 90 tablet 3   VIAGRA 100 MG tablet Take 0.5 tablets (50 mg total) by mouth daily as needed (take 1/2 tablet by mouth daily as needed for erectile dysfunction). 10 tablet 1   lenalidomide (REVLIMID) 10 MG capsule TAKE 1 CAPSULE BY MOUTH DAILY  FOR 21 DAYS, THEN 7 DAYS OFF Auth# [id number] (Patient not taking: Reported on 08/10/2023) 21 capsule 0   tamsulosin (FLOMAX) 0.4 MG CAPS capsule Take 1 CAPSULE by mouth  ONCE DAILY 30 capsule 10   No facility-administered medications prior to visit.    No Known Allergies  Review of Systems  Constitutional:  Negative for weight loss.  HENT:  Positive for hearing loss. Negative for congestion.   Eyes:  Negative for blurred vision.  Respiratory:  Negative for cough.   Cardiovascular:  Negative for leg swelling.  Gastrointestinal:  Negative for constipation and diarrhea.  Genitourinary:  Negative for dysuria and frequency.  Musculoskeletal:  Negative for joint pain and myalgias.  Skin:  Negative for rash.  Neurological:  Positive for headaches (associated with tinnitis).  Psychiatric/Behavioral:  Positive for depression. The patient is nervous/anxious.        Lab Results  Component Value Date   CHOL 167 06/26/2022   HDL 45.50 06/26/2022   LDLCALC 101 (H) 06/26/2022   LDLDIRECT 93.0 11/26/2017   TRIG 102.0 06/26/2022   CHOLHDL 4 06/26/2022    Objective:    Physical Exam   BP 125/75 (BP Location: Right Arm, Patient Position: Sitting, Cuff Size: Small)   Pulse 70   Temp 98.2 F (36.8 C) (Oral)   Resp 16   Wt 190 lb (86.2 kg)   SpO2 100%   BMI 25.77 kg/m  Wt Readings from Last 3 Encounters:  08/10/23 190 lb (86.2 kg)  04/02/23 191 lb (86.6 kg)  12/23/22 188 lb 1.6 oz (85.3 kg)   Physical Exam  Constitutional: He is oriented to Day, place, and time. He appears well-developed and well-nourished. No distress.  HENT:  Head: Normocephalic and atraumatic.  Right Ear: Tympanic membrane and ear canal normal.  Left Ear: Tympanic membrane and ear canal normal.  Mouth/Throat: Oropharynx is clear and moist.  Eyes: Pupils are equal, round, and reactive to light. No scleral icterus.  Neck: Normal range of motion. No thyromegaly present.  Cardiovascular: Normal rate and regular rhythm.   No murmur heard. Pulmonary/Chest: Effort normal and breath sounds normal. No respiratory distress. He has no wheezes. He has no rales. He exhibits no  tenderness. There is no tenderness. There is no rebound and no guarding.  Musculoskeletal: He exhibits no edema.  Lymphadenopathy: He has no cervical adenopathy.  Neurological: He is alert and oriented to Day, place, and time. He has normal patellar reflexes. He exhibits normal muscle tone. Coordination normal.  Skin: Skin is warm and dry.  Psychiatric: He is tearful. His behavior is normal. Judgment and thought content normal.           Assessment & Plan:       Assessment & Plan:   Problem List Items Addressed This Visit       Unprioritized   Kappa light chain myeloma (HCC) (Chronic)    Unfortunately, he declines further treatment for his myeloma. I will notify his oncologist.       Vitamin D deficiency    Update vit D level.      Relevant Orders   Vitamin D (25 hydroxy)   Tinnitus, bilateral    Uncontrolled. He is picking up a new hearing aid on Thursday and he is hopeful this will help him.       Preventative health care - Primary    Colo up to date. Flu shot today. Recommend covid booster at his pharmacy.       Needs flu shot   Relevant Orders   Flu vaccine trivalent PF, 6mos and older(Flulaval,Afluria,Fluarix,Fluzone)   Hyperlipidemia   Relevant Orders   Comp Met (CMET)   Essential hypertension    BP Readings from Last 3 Encounters:  08/10/23 125/75  04/02/23 123/77  12/23/22 137/78   BP stable on losartan. Continue same.       Erectile dysfunction    He has cialis 5mg  and Viagra 100mg  (1/2 tab PRN) on his list. He is not taking cialis daily.  I advised him  to d/c cialis. He wishes to keep both on hand and is aware not to take them together.       Elevated PSA    He is advised to schedule a follow up with urology.       Chronic kidney disease, stage 3a (HCC)    Update renal function. He continues to follow with nephrology. Had a recent renal US which was normal.       B12 deficiency   Relevant Orders   B12    I have discontinued Izola Price.  Bowermaster's tamsulosin and lenalidomide. I am also having him maintain his cholecalciferol, losartan, tadalafil, buPROPion, omega-3 acid ethyl esters, and Viagra.  No orders of the defined types were placed in this encounter.

## 2023-08-10 NOTE — Assessment & Plan Note (Signed)
He is advised to schedule a follow up with urology.

## 2023-08-10 NOTE — Assessment & Plan Note (Addendum)
BP Readings from Last 3 Encounters:  08/10/23 125/75  04/02/23 123/77  12/23/22 137/78   BP stable on losartan. Continue same.

## 2023-08-10 NOTE — Assessment & Plan Note (Signed)
Update vit D level.

## 2023-08-10 NOTE — Assessment & Plan Note (Signed)
He has cialis 5mg  and Viagra 100mg  (1/2 tab PRN) on his list. He is not taking cialis daily.  I advised him to d/c cialis. He wishes to keep both on hand and is aware not to take them together.

## 2023-08-10 NOTE — Progress Notes (Unsigned)
Subjective:     Patient ID: Jimmy Day, male    DOB: Mar 26, 1961, 62 y.o.   MRN: [id number]  Chief Complaint  Patient presents with   Hyperlipidemia    Here for follow up   Anxiety    Here for follow up   Depression    Here for follow up    Hyperlipidemia Pertinent negatives include no chest pain or myalgias.  Anxiety Patient reports no chest pain, dizziness, nausea or palpitations.    Depression        Associated symptoms include no myalgias and no headaches.  Past medical history includes anxiety.     Discussed the use of AI scribe software for clinical note transcription with the patient, who gave verbal consent to proceed.   62 year old male presents to the clinic today for follow up for depression. Patient states that he feels that his depression feels like it is getting worse  Around August his depression got worse he states. He states that he started to have thoughts. He states that the Wellbutrin XL is helping but not as much as he would like.   Ringing in ears is getting worse over the last 3 years (left ear is more dominant) Ringing is constant in the ears. He states that it is so bad in the left ear he is unsure if he has ringing in the right ear. Gets worse through out the day.   [facility] will help with the ringing in his ears.   Quit cancer treatments  White noise machine did help but it broke and he is getting a new one from the [place]        Health Maintenance Due  Topic Date Due   INFLUENZA VACCINE  06/24/2023   COVID-19 Vaccine (5 - 2023-24 season) 07/25/2023    Past Medical History:  Diagnosis Date   Arthritis    spine   Elevated PSA 11/29/2017   Fasting hyperglycemia    NORMAL A1c   History of COVID-19 2020   Hyperlipidemia    Hypertension    Kappa light chain myeloma (HCC) 02/02/2014   Norovirus 09/20/2022   Pneumonia    during covid in 2020   Testosterone deficiency    Dr Marcello Fennel     Past Surgical History:  Procedure Laterality Date   ANTERIOR CERVICAL DECOMP/DISCECTOMY FUSION N/A 04/30/2021   Procedure: Anterior Cervical Decompression Fusion Cervical three-four, Cervical four-five, Cervical six-seven;  Surgeon: Bedelia Person, MD;  Location: [facility] OR;  Service: Neurosurgery;  Laterality: N/A;   CERVICAL PLATE INSERTION     POST COMPRESSION DISC INJURY   LIPOMAS REMOVAL     BENIGN FROM CHEST AND BACK    Family History  Problem Relation Age of Onset   Breast cancer Mother    Heart attack Father 35   Kidney disease Brother        RENAL FAILURE   Hypertension Brother        (died from sepsis at 57)   Heart attack Maternal Aunt 23       CABG   Stroke Maternal Uncle        CVA   Heart attack Paternal Grandmother    Diabetes Neg Hx    Colon cancer Neg Hx    Esophageal cancer Neg Hx    Rectal cancer Neg Hx    Stomach cancer Neg Hx    Colon polyps Neg Hx  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  Tobacco Use   Smoking status: Never   Smokeless tobacco: Never   Tobacco comments:    never used tobacco  Vaping Use   Vaping status: Never Used  Substance and Sexual Activity   Alcohol use: Not Currently    Comment:  rarely   Drug use: No   Sexual activity: Yes    Birth control/protection: Condom  Other Topics Concern   Not on file  Social History Narrative   Son in [place]   Son in [place]   9 grandchildren (6 grandsons, 3 grandchildren)   Daughter at [employer]   Married   Truck driver   Enjoys exercise, basketball, watch sports, 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, dispensing   Social Determinants of Corporate investment banker Strain: Not on file  Food Insecurity: No Food Insecurity (09/10/2022)    Hunger Vital Sign    Worried About Running Out of Food in the Last Year: Never true    Ran Out of Food in the Last Year: Never true  Transportation Needs: No Transportation Needs (09/10/2022)   PRAPARE - Administrator, Civil Service (Medical): No    Lack of Transportation (Non-Medical): No  Physical Activity: Not on file  Stress: Not on file  Social Connections: Unknown (04/05/2022)   Received from [facility]   Social Network    Social Network: Not on file  Intimate Partner Violence: Not At Risk (09/10/2022)   Humiliation, Afraid, Rape, and Kick questionnaire    Fear of Current or Ex-Partner: No    Emotionally Abused: No    Physically Abused: No    Sexually Abused: No    Outpatient Medications Prior to Visit  Medication Sig Dispense Refill   buPROPion (WELLBUTRIN XL) 150 MG 24 hr tablet Take 1 tablet (150 mg total) by mouth daily. 90 tablet 1   cholecalciferol (VITAMIN D3) 25 MCG (1000 UT) tablet Take 1 tablet (1,000 Units total) by mouth daily.     losartan (COZAAR) 50 MG tablet Take 1 tablet by mouth daily 90 tablet 3   omega-3 acid ethyl esters (LOVAZA) 1 g capsule Take 2 capsules (2 g total) by mouth 2 (two) times daily. 120 capsule 5   tadalafil (CIALIS) 5 MG tablet Take 1 tablet (5 mg total) by mouth daily. 90 tablet 3   VIAGRA 100 MG tablet Take 0.5 tablets (50 mg total) by mouth daily as needed (take 1/2 tablet by mouth daily as needed for erectile dysfunction). 10 tablet 1   lenalidomide (REVLIMID) 10 MG capsule TAKE 1 CAPSULE BY MOUTH DAILY  FOR 21 DAYS, THEN 7 DAYS OFF Auth# [id number] (Patient not taking: Reported on 08/10/2023) 21 capsule 0   tamsulosin (FLOMAX) 0.4 MG CAPS capsule Take 1 CAPSULE by mouth ONCE DAILY 30 capsule 10   No facility-administered medications prior to visit.    No Known Allergies  Review of Systems  Constitutional:  Negative for chills.  HENT:  Positive for ear pain and tinnitus. Negative for hearing loss,  nosebleeds, sinus pain and sore throat.   Eyes:  Negative for blurred vision and double vision.  Respiratory:  Negative for  cough and wheezing.   Cardiovascular:  Negative for chest pain and palpitations.  Gastrointestinal:  Negative for diarrhea, nausea and vomiting.  Musculoskeletal:  Negative for myalgias.  Neurological:  Negative for dizziness, tingling and headaches.  Psychiatric/Behavioral:  Positive for depression.        Objective:    Physical Exam Vitals reviewed.  Constitutional:      Appearance: Normal appearance. He is normal weight.  HENT:     Head: Normocephalic.     Right Ear: Tympanic membrane, ear canal and external ear normal.     Left Ear: Tympanic membrane, ear canal and external ear normal.     Nose: Nose normal.     Mouth/Throat:     Mouth: Mucous membranes are moist.  Eyes:     Extraocular Movements: Extraocular movements intact.     Conjunctiva/sclera: Conjunctivae normal.     Pupils: Pupils are equal, round, and reactive to light.  Cardiovascular:     Rate and Rhythm: Normal rate and regular rhythm.     Pulses: Normal pulses.     Heart sounds: Normal heart sounds.  Pulmonary:     Effort: Pulmonary effort is normal.     Breath sounds: Normal breath sounds.  Abdominal:     General: Abdomen is flat. Bowel sounds are normal.     Palpations: Abdomen is soft.  Musculoskeletal:        General: Normal range of motion.     Cervical back: Normal range of motion.  Skin:    General: Skin is warm.  Neurological:     General: No focal deficit present.     Mental Status: He is alert and oriented to person, place, and time. Mental status is at baseline.  Psychiatric:        Mood and Affect: Mood normal.        Behavior: Behavior normal.        Thought Content: Thought content normal.        Judgment: Judgment normal.      BP 125/75 (BP Location: Right Arm, Patient Position: Sitting, Cuff Size: Small)   Pulse 70   Temp 98.2 F (36.8 C) (Oral)   Resp  16   Wt 190 lb (86.2 kg)   SpO2 100%   BMI 25.77 kg/m  Wt Readings from Last 3 Encounters:  08/10/23 190 lb (86.2 kg)  04/02/23 191 lb (86.6 kg)  12/23/22 188 lb 1.6 oz (85.3 kg)       Assessment & Plan:   Problem List Items Addressed This Visit   None Visit Diagnoses     Needs flu shot    -  Primary   Relevant Orders   Flu vaccine trivalent PF, 6mos and older(Flulaval,Afluria,Fluarix,Fluzone)       I have discontinued Izola Price. Biggers's tamsulosin. I am also having him maintain his cholecalciferol, losartan, tadalafil, buPROPion, omega-3 acid ethyl esters, Viagra, and lenalidomide.  No orders of the defined types were placed in this encounter.

## 2023-08-10 NOTE — Assessment & Plan Note (Signed)
Uncontrolled. He is picking up a new hearing aid on Thursday and he is hopeful this will help him.

## 2023-08-10 NOTE — Assessment & Plan Note (Signed)
Unfortunately, he declines further treatment for his myeloma. I will notify his oncologist.

## 2023-08-10 NOTE — Assessment & Plan Note (Signed)
Colo up to date. Flu shot today. Recommend covid booster at his pharmacy.

## 2023-08-10 NOTE — Assessment & Plan Note (Signed)
Update renal function. He continues to follow with nephrology. Had a recent renal US which was normal.

## 2023-08-11 ENCOUNTER — Telehealth: Payer: Self-pay | Admitting: Family

## 2023-08-11 ENCOUNTER — Other Ambulatory Visit (HOSPITAL_BASED_OUTPATIENT_CLINIC_OR_DEPARTMENT_OTHER): Payer: Self-pay

## 2023-08-11 DIAGNOSIS — E559 Vitamin D deficiency, unspecified: Secondary | ICD-10-CM

## 2023-08-11 MED ORDER — VITAMIN D (ERGOCALCIFEROL) 1.25 MG (50000 UNIT) PO CAPS
50000.0000 [IU] | ORAL_CAPSULE | ORAL | 0 refills | Status: DC
Start: 2023-08-11 — End: 2023-12-08
  Filled 2023-08-11 – 2023-08-24 (×2): qty 4, 28d supply, fill #0

## 2023-08-11 NOTE — Telephone Encounter (Signed)
Vitamin D level is low.  Advise patient to begin vit D 50000 units once weekly for 12 weeks, then repeat vit D level (dx Vit D deficiency).    B12 level is normal.  Kidney function is stable.

## 2023-08-11 NOTE — Patient Instructions (Signed)
VISIT SUMMARY:  During your visit, we discussed your ongoing issues with depression, fatigue from cancer treatments, tinnitus, and erectile dysfunction. We also touched on your prostate health and general health maintenance. We made some changes to your treatment plan and scheduled some follow-up appointments.  YOUR PLAN:  -DEPRESSION: Depression is a mood disorder that causes a persistent feeling of sadness and loss of interest. We will continue with your increased dose of Wellbutrin, a medication that helps improve your mood. We will reassess your mood in three months.  -CANCER TREATMENT FATIGUE: Fatigue is a common side effect of cancer treatments. We respect your decision to discontinue cancer treatments due to fatigue and overall tiredness, but we want to ensure this decision is not solely due to uncontrolled depression. We will notify Dr. Myna Hidalgo of your decision.   -TINNITUS: Tinnitus is the perception of noise or ringing in the ears. We encourage you to maintain realistic expectations regarding the effectiveness of your upcoming hearing aid fitting and to continue your current management strategies.  -ERECTILE DYSFUNCTION: Erectile dysfunction is the inability to get and keep an erection firm enough for sex. We advised against the concurrent use of Viagra and Cialis, two medications that help improve blood flow to the penis. You can continue to use them as needed, but not at the same time.  -PROSTATE HEALTH: Your prostate is moderately enlarged, as noted on a recent ultrasound.  Please schedule a follow up visit with your Urologist.   -GENERAL HEALTH MAINTENANCE: We administered the influenza vaccine today and encourage you to get a COVID-19 booster at your local pharmacy. Your colonoscopy and tetanus are up to date. We will check your B12, kidney function, and Vitamin D levels today. We also encourage you to follow up with your eye doctor and dentist. We will see you again in three  months.  INSTRUCTIONS:  Please remember to schedule your follow-up appointments with urology, your eye doctor, and your dentist. Also, get your COVID-19 booster at your local pharmacy when you can. Continue taking your medications as prescribed, but remember not to take Viagra and Cialis at the same time.

## 2023-08-13 NOTE — Telephone Encounter (Signed)
Patient notified or results, new medication and scheduled to return in 3 months

## 2023-08-24 ENCOUNTER — Other Ambulatory Visit (HOSPITAL_BASED_OUTPATIENT_CLINIC_OR_DEPARTMENT_OTHER): Payer: Self-pay

## 2023-08-26 ENCOUNTER — Encounter: Payer: Self-pay | Admitting: Family

## 2023-09-03 ENCOUNTER — Ambulatory Visit: Payer: 59

## 2023-09-03 DIAGNOSIS — E538 Deficiency of other specified B group vitamins: Secondary | ICD-10-CM | POA: Diagnosis not present

## 2023-09-03 MED ORDER — CYANOCOBALAMIN 1000 MCG/ML IJ SOLN
1000.0000 ug | Freq: Once | INTRAMUSCULAR | Status: AC
Start: 2023-09-03 — End: 2023-09-03
  Administered 2023-09-03: 1000 ug via INTRAMUSCULAR

## 2023-09-03 NOTE — Progress Notes (Signed)
Pt here for monthly B12 injection per Melissa   B12 1000mcg given L deltoid IM, and pt tolerated injection well.   Next B12 injection scheduled for 1 month.  

## 2023-09-10 ENCOUNTER — Telehealth: Payer: Self-pay | Admitting: *Deleted

## 2023-09-10 NOTE — Telephone Encounter (Signed)
Per scheduling message Erie Noe - called and was unable to lvm - mailbox not set up - mailed calendar of new upcoming appointments

## 2023-09-16 ENCOUNTER — Ambulatory Visit: Payer: 59

## 2023-09-16 ENCOUNTER — Inpatient Hospital Stay: Payer: 59 | Admitting: Medical Oncology

## 2023-09-16 ENCOUNTER — Inpatient Hospital Stay: Payer: 59 | Attending: Hematology & Oncology

## 2023-09-24 ENCOUNTER — Other Ambulatory Visit (HOSPITAL_BASED_OUTPATIENT_CLINIC_OR_DEPARTMENT_OTHER): Payer: Self-pay

## 2023-09-28 ENCOUNTER — Ambulatory Visit: Payer: 59 | Admitting: Cardiology

## 2023-10-01 ENCOUNTER — Ambulatory Visit: Payer: 59

## 2023-10-01 NOTE — Progress Notes (Deleted)
Jimmy Day is a 62 y.o. male presents to the office today for Monthly B12 injection, per physician's orders. Original order: 08/11/2023: "B12 level is normal. " Cyanocobalamin 1000 mg/ml IM was administered *** deltoid today. Patient tolerated injection. Patient due for follow up labs/provider appt: Yes. Date due: 11/09/23, appt made No- apt already pending Patient next injection due: 1 month, appt made No- will get at next OV.  Creft, Feliberto Harts

## 2023-10-11 LAB — PSA
PSA, FREE: 0.89
PSA: 4.6

## 2023-10-27 ENCOUNTER — Encounter: Payer: Self-pay | Admitting: Hematology & Oncology

## 2023-10-27 ENCOUNTER — Other Ambulatory Visit (HOSPITAL_BASED_OUTPATIENT_CLINIC_OR_DEPARTMENT_OTHER): Payer: Self-pay

## 2023-10-27 ENCOUNTER — Encounter: Payer: Self-pay | Admitting: Family

## 2023-10-27 ENCOUNTER — Other Ambulatory Visit: Payer: Self-pay | Admitting: Family

## 2023-10-27 MED ORDER — LOSARTAN POTASSIUM 50 MG PO TABS
50.0000 mg | ORAL_TABLET | Freq: Every day | ORAL | 1 refills | Status: DC
  Filled 2023-10-27: qty 90, 90d supply, fill #0

## 2023-10-27 MED ORDER — LOSARTAN POTASSIUM 50 MG PO TABS
50.0000 mg | ORAL_TABLET | Freq: Every day | ORAL | 3 refills | Status: DC
  Filled 2023-10-27: qty 90, 90d supply, fill #0

## 2023-10-28 ENCOUNTER — Encounter: Payer: Self-pay | Admitting: Hematology & Oncology

## 2023-10-28 ENCOUNTER — Other Ambulatory Visit (HOSPITAL_BASED_OUTPATIENT_CLINIC_OR_DEPARTMENT_OTHER): Payer: Self-pay

## 2023-10-28 MED ORDER — LOSARTAN POTASSIUM 50 MG PO TABS
50.0000 mg | ORAL_TABLET | Freq: Every day | ORAL | 3 refills | Status: DC
  Filled 2023-10-28: qty 90, 90d supply, fill #0
  Filled 2024-02-08: qty 90, 90d supply, fill #1
  Filled 2024-05-25: qty 90, 90d supply, fill #2
  Filled 2024-08-25: qty 90, 90d supply, fill #3

## 2023-11-01 ENCOUNTER — Other Ambulatory Visit (HOSPITAL_BASED_OUTPATIENT_CLINIC_OR_DEPARTMENT_OTHER): Payer: Self-pay

## 2023-11-03 ENCOUNTER — Encounter: Payer: Self-pay | Admitting: Hematology & Oncology

## 2023-11-09 ENCOUNTER — Ambulatory Visit: Payer: 59 | Admitting: Family

## 2023-11-10 ENCOUNTER — Ambulatory Visit: Payer: 59 | Admitting: Family

## 2023-12-03 ENCOUNTER — Ambulatory Visit: Payer: 59 | Admitting: Family

## 2023-12-03 ENCOUNTER — Encounter: Payer: Self-pay | Admitting: Hematology & Oncology

## 2023-12-07 DIAGNOSIS — F4321 Adjustment disorder with depressed mood: Secondary | ICD-10-CM | POA: Insufficient documentation

## 2023-12-07 DIAGNOSIS — Z461 Encounter for fitting and adjustment of hearing aid: Secondary | ICD-10-CM | POA: Insufficient documentation

## 2023-12-07 DIAGNOSIS — G8929 Other chronic pain: Secondary | ICD-10-CM | POA: Insufficient documentation

## 2023-12-07 DIAGNOSIS — F331 Major depressive disorder, recurrent, moderate: Secondary | ICD-10-CM | POA: Insufficient documentation

## 2023-12-07 DIAGNOSIS — F32A Depression, unspecified: Secondary | ICD-10-CM | POA: Insufficient documentation

## 2023-12-07 DIAGNOSIS — L730 Acne keloid: Secondary | ICD-10-CM | POA: Insufficient documentation

## 2023-12-07 DIAGNOSIS — F4322 Adjustment disorder with anxiety: Secondary | ICD-10-CM | POA: Insufficient documentation

## 2023-12-07 DIAGNOSIS — H9190 Unspecified hearing loss, unspecified ear: Secondary | ICD-10-CM | POA: Insufficient documentation

## 2023-12-08 ENCOUNTER — Ambulatory Visit: Payer: BC Managed Care – PPO | Attending: Cardiology | Admitting: Cardiology

## 2023-12-08 ENCOUNTER — Other Ambulatory Visit (HOSPITAL_BASED_OUTPATIENT_CLINIC_OR_DEPARTMENT_OTHER): Payer: Self-pay

## 2023-12-08 ENCOUNTER — Encounter: Payer: Self-pay | Admitting: Cardiology

## 2023-12-08 ENCOUNTER — Encounter: Payer: Self-pay | Admitting: Hematology & Oncology

## 2023-12-08 VITALS — BP 118/80 | HR 71 | Ht 72.0 in | Wt 201.0 lb

## 2023-12-08 DIAGNOSIS — F419 Anxiety disorder, unspecified: Secondary | ICD-10-CM | POA: Diagnosis not present

## 2023-12-08 DIAGNOSIS — E782 Mixed hyperlipidemia: Secondary | ICD-10-CM | POA: Diagnosis not present

## 2023-12-08 DIAGNOSIS — F32A Depression, unspecified: Secondary | ICD-10-CM

## 2023-12-08 DIAGNOSIS — C9000 Multiple myeloma not having achieved remission: Secondary | ICD-10-CM

## 2023-12-08 DIAGNOSIS — I10 Essential (primary) hypertension: Secondary | ICD-10-CM | POA: Diagnosis not present

## 2023-12-08 DIAGNOSIS — R931 Abnormal findings on diagnostic imaging of heart and coronary circulation: Secondary | ICD-10-CM | POA: Diagnosis not present

## 2023-12-08 MED ORDER — ATORVASTATIN CALCIUM 10 MG PO TABS
10.0000 mg | ORAL_TABLET | Freq: Every day | ORAL | 3 refills | Status: AC
  Filled 2023-12-08: qty 90, 90d supply, fill #0
  Filled 2024-05-25: qty 90, 90d supply, fill #1
  Filled 2024-08-25: qty 90, 90d supply, fill #2

## 2023-12-08 NOTE — Addendum Note (Signed)
 Addended by: Shawnee Dellen D on: 12/08/2023 11:15 AM   Modules accepted: Orders

## 2023-12-08 NOTE — Patient Instructions (Addendum)
 Medication Instructions:   START: Lipitor 10mg  1 tablet daily   Lab Work: 3rd Floor   [address]  Your physician recommends that you return for lab work in:   1 month You need to have labs done when you are fasting.  You can come Monday through Friday 8:00 am to 11:30AM and 1:00 to 4:00. You do not need to make an appointment as the order has already been placed.   Testing/Procedures: None Ordered   Follow-Up: At [facility], you and your health needs are our priority.  As part of our continuing mission to provide you with exceptional heart care, we have created designated Provider Care Teams.  These Care Teams include your primary Cardiologist (physician) and Advanced Practice Providers (APPs -  Physician Assistants and Nurse Practitioners) who all work together to provide you with the care you need, when you need it.  We recommend signing up for the patient portal called "MyChart".  Sign up information is provided on this After Visit Summary.  MyChart is used to connect with patients for Virtual Visits (Telemedicine).  Patients are able to view lab/test results, encounter notes, upcoming appointments, etc.  Non-urgent messages can be sent to your provider as well.   To learn more about what you can do with MyChart, go to [email or web address].    Your next appointment:   6 month(s)  The format for your next appointment:   In Person  Provider:   Ralene Burger, MD    Other Instructions NA

## 2023-12-08 NOTE — Progress Notes (Signed)
 Cardiology Office Note:    Date:  12/08/2023   ID:  Jimmy Day, DOB July 16, 1961, MRN [id number]  PCP:  Dorrene Gaucher, NP  Cardiologist:  Ralene Burger, MD    Referring MD: Dorrene Gaucher, NP   Chief Complaint  Patient presents with   Follow-up    History of Present Illness:    Jimmy Day is a 63 y.o. male who was referred to us  for risk factors modifications and verifications.  He was complaining 5 some shortness of breath.  Also does have history of myeloma as well as hyperlipidemia.  Comes today to discuss results of his test.  Echocardiogram showed preserved ejection fraction with no significant valvular pathology only mild mitral regurgitation, he did have a calcium  score calculated which is 66.4 which places him in 78%: He is here to talk about this.  He is asymptomatic denies have any chest pain tightness squeezing pressure burning chest still active with no difficulties  Past Medical History:  Diagnosis Date   Arthritis    spine   Elevated PSA 11/29/2017   Fasting hyperglycemia    NORMAL A1c   History of COVID-19 2020   Hyperlipidemia    Hypertension    Kappa light chain myeloma (HCC) 02/02/2014   Norovirus 09/20/2022   Pneumonia    during covid in 2020   Testosterone  deficiency    Dr Tommas Fragmin    Past Surgical History:  Procedure Laterality Date   ANTERIOR CERVICAL DECOMP/DISCECTOMY FUSION N/A 04/30/2021   Procedure: Anterior Cervical Decompression Fusion Cervical three-four, Cervical four-five, Cervical six-seven;  Surgeon: Van Gelinas, MD;  Location: [facility] OR;  Service: Neurosurgery;  Laterality: N/A;   CERVICAL PLATE INSERTION     POST COMPRESSION DISC INJURY   LIPOMAS REMOVAL     BENIGN FROM CHEST AND BACK    Current Medications: Current Meds  Medication Sig   buPROPion  (WELLBUTRIN  XL) 150 MG 24 hr tablet Take 1 tablet (150 mg total) by mouth daily.   cholecalciferol  (VITAMIN D3) 25 MCG (1000 UT) tablet Take 1 tablet (1,000 Units  total) by mouth daily.   losartan  (COZAAR ) 50 MG tablet Take 1 tablet (50 mg total) by mouth daily.   omega-3 acid ethyl esters (LOVAZA ) 1 g capsule Take 2 capsules (2 g total) by mouth 2 (two) times daily.   tadalafil  (CIALIS ) 5 MG tablet Take 1 tablet (5 mg total) by mouth daily.   VIAGRA  100 MG tablet Take 0.5 tablets (50 mg total) by mouth daily as needed (take 1/2 tablet by mouth daily as needed for erectile dysfunction). (Patient taking differently: Take 50 mg by mouth daily as needed for erectile dysfunction (take 1/2 tablet by mouth daily as needed for erectile dysfunction).)   [DISCONTINUED] losartan  (COZAAR ) 50 MG tablet Take 1 tablet (50 mg total) by mouth daily.   [DISCONTINUED] Vitamin D , Ergocalciferol , (DRISDOL ) 1.25 MG (50000 UNIT) CAPS capsule Take 1 capsule (50,000 Units total) by mouth every 7 (seven) days.     Allergies:   Patient has no known allergies.   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  Tobacco Use   Smoking status: Never   Smokeless tobacco: Never   Tobacco comments:    never used  tobacco  Vaping Use   Vaping status: Never Used  Substance and Sexual Activity   Alcohol use: Not Currently    Comment:  rarely   Drug use: No   Sexual activity: Yes    Birth control/protection: Condom  Other Topics Concern   Not on file  Social History Narrative   Son in [place]    Son in [place]   9 grandchildren (6 grandsons, 3 grandchildren)   Daughter at [employer]   Married   Truck driver   Enjoys exercise, basketball, watch sports, 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, dispensing   Social Drivers of Corporate investment banker Strain: Not on file  Food Insecurity: No Food Insecurity (09/10/2022)   Hunger Vital Sign     Worried About Running Out of Food in the Last Year: Never true    Ran Out of Food in the Last Year: Never true  Transportation Needs: No Transportation Needs (09/10/2022)   PRAPARE - Administrator, Civil Service (Medical): No    Lack of Transportation (Non-Medical): No  Physical Activity: Not on file  Stress: Not on file  Social Connections: Unknown (04/05/2022)   Received from [facility]   Social Network    Social Network: Not on file     Family History: The patient's family history includes Breast cancer in his mother; Heart attack in his paternal grandmother; Heart attack (age of onset: 16) in his father; Heart attack (age of onset: 41) in his maternal aunt; Hypertension in his brother; Kidney disease in his brother; Stroke in his maternal uncle. There is no history of Diabetes, Colon cancer, Esophageal cancer, Rectal cancer, Stomach cancer, or Colon polyps. ROS:   Please see the history of present illness.    All 14 point review of systems negative except as described per history of present illness  EKGs/Labs/Other Studies Reviewed:    EKG Interpretation Date/Time:  Wednesday December 08 2023 10:51:20 EST Ventricular Rate:  73 PR Interval:  154 QRS Duration:  108 QT Interval:  378 QTC Calculation: 416 R Axis:   -54  Text Interpretation: Normal sinus rhythm Left axis deviation Nonspecific T wave abnormality When compared with ECG of 10-Sep-2022 04:17, PREVIOUS ECG IS PRESENT Confirmed by Ralene Burger [phone]) on 12/08/2023 10:55:08 AM    Recent Labs: 04/02/2023: Hemoglobin 13.6; Platelet Count 200 08/10/2023: ALT 25; BUN 15; Creatinine, Ser 1.43; Potassium 4.1; Sodium 137  Recent Lipid Panel    Component Value Date/Time   CHOL 167 06/26/2022 1028   TRIG 102.0 06/26/2022 1028   HDL 45.50 06/26/2022 1028   CHOLHDL 4 06/26/2022 1028   VLDL 20.4 06/26/2022 1028   LDLCALC 101 (H) 06/26/2022 1028   LDLCALC 82 07/31/2020 0726   LDLDIRECT 93.0  11/26/2017 0852    Physical Exam:    VS:  BP 118/80 (BP Location: Right Arm, Patient Position: Sitting)   Pulse 71   Ht 6' (1.829 m)   Wt 201 lb (91.2 kg)   SpO2 93%   BMI 27.26 kg/m     Wt Readings from Last 3 Encounters:  12/08/23 201 lb (91.2 kg)  08/10/23 190 lb (86.2 kg)  04/02/23 191 lb (86.6 kg)     GEN:  Well nourished, well developed in no acute distress HEENT: Normal NECK: No JVD; No carotid bruits LYMPHATICS: No lymphadenopathy CARDIAC: RRR, no murmurs, no rubs, no gallops RESPIRATORY:  Clear to auscultation without rales, wheezing or rhonchi  ABDOMEN: Soft, non-tender,  non-distended MUSCULOSKELETAL:  No edema; No deformity  SKIN: Warm and dry LOWER EXTREMITIES: no swelling NEUROLOGIC:  Alert and oriented x 3 PSYCHIATRIC:  Normal affect   ASSESSMENT:    1. Primary hypertension   2. Elevated coronary artery calcium  score 66.4 which is 78 percentile   3. Anxiety and depression   4. Mixed hyperlipidemia   5. Kappa light chain myeloma (HCC)    PLAN:    In order of problems listed above:  Elevated calcium  score his calcium  score is 66.4 which places him 78 percentile.  I did calculated his 10 years predicted risk which is 8.5%.  His coronary age is 36.  I explained all this information to him and meaning of it.  Since he is intermediate category he need to be treated with statin.  I will start him on Lipitor 10 mg daily in 4 weeks from now will check liver function test as well as fasting lipid profile. Essential hypertension blood pressure well-controlled today continue present management. Mixed dyslipidemia plan as described above. Multiple myeloma stable.   Medication Adjustments/Labs and Tests Ordered: Current medicines are reviewed at length with the patient today.  Concerns regarding medicines are outlined above.  Orders Placed This Encounter  Procedures   EKG 12-Lead   Medication changes: No orders of the defined types were placed in this  encounter.   Signed, Manfred Seed, MD, [facility] 12/08/2023 11:08 AM    [facility]

## 2023-12-09 ENCOUNTER — Other Ambulatory Visit (HOSPITAL_BASED_OUTPATIENT_CLINIC_OR_DEPARTMENT_OTHER): Payer: Self-pay

## 2023-12-09 DIAGNOSIS — N401 Enlarged prostate with lower urinary tract symptoms: Secondary | ICD-10-CM | POA: Diagnosis not present

## 2023-12-09 DIAGNOSIS — N5201 Erectile dysfunction due to arterial insufficiency: Secondary | ICD-10-CM | POA: Diagnosis not present

## 2023-12-09 DIAGNOSIS — R972 Elevated prostate specific antigen [PSA]: Secondary | ICD-10-CM | POA: Diagnosis not present

## 2023-12-09 DIAGNOSIS — R351 Nocturia: Secondary | ICD-10-CM | POA: Diagnosis not present

## 2023-12-09 MED ORDER — TAMSULOSIN HCL 0.4 MG PO CAPS
0.4000 mg | ORAL_CAPSULE | Freq: Every day | ORAL | 3 refills | Status: AC
  Filled 2023-12-09: qty 90, 90d supply, fill #0

## 2023-12-14 ENCOUNTER — Ambulatory Visit: Payer: BC Managed Care – PPO | Admitting: Family

## 2023-12-14 ENCOUNTER — Other Ambulatory Visit (HOSPITAL_BASED_OUTPATIENT_CLINIC_OR_DEPARTMENT_OTHER): Payer: Self-pay

## 2023-12-14 VITALS — BP 127/72 | HR 61 | Temp 97.7°F | Ht 72.0 in | Wt 200.0 lb

## 2023-12-14 DIAGNOSIS — I10 Essential (primary) hypertension: Secondary | ICD-10-CM | POA: Diagnosis not present

## 2023-12-14 DIAGNOSIS — G4700 Insomnia, unspecified: Secondary | ICD-10-CM

## 2023-12-14 DIAGNOSIS — E782 Mixed hyperlipidemia: Secondary | ICD-10-CM

## 2023-12-14 DIAGNOSIS — N1831 Chronic kidney disease, stage 3a: Secondary | ICD-10-CM | POA: Diagnosis not present

## 2023-12-14 DIAGNOSIS — E538 Deficiency of other specified B group vitamins: Secondary | ICD-10-CM

## 2023-12-14 DIAGNOSIS — R972 Elevated prostate specific antigen [PSA]: Secondary | ICD-10-CM | POA: Diagnosis not present

## 2023-12-14 DIAGNOSIS — F331 Major depressive disorder, recurrent, moderate: Secondary | ICD-10-CM | POA: Diagnosis not present

## 2023-12-14 DIAGNOSIS — N529 Male erectile dysfunction, unspecified: Secondary | ICD-10-CM | POA: Diagnosis not present

## 2023-12-14 MED ORDER — HYDROXYZINE PAMOATE 25 MG PO CAPS
25.0000 mg | ORAL_CAPSULE | Freq: Two times a day (BID) | ORAL | 1 refills | Status: AC | PRN
  Filled 2023-12-14: qty 60, 30d supply, fill #0

## 2023-12-14 MED ORDER — CYANOCOBALAMIN 1000 MCG/ML IJ SOLN
1000.0000 ug | Freq: Once | INTRAMUSCULAR | Status: AC
  Administered 2023-12-14: 1000 ug via INTRAMUSCULAR

## 2023-12-14 NOTE — Assessment & Plan Note (Signed)
Lab Results  Component Value Date   CREATININE 1.43 08/10/2023   Follows with [facility]- Dr. Verna Czech.

## 2023-12-14 NOTE — Assessment & Plan Note (Signed)
He has not had injection recently- will resume today. His schedule with his new job will make it easier to come in.

## 2023-12-14 NOTE — Assessment & Plan Note (Signed)
Stable on cialis/Viagra. Seeing Urology.

## 2023-12-14 NOTE — Assessment & Plan Note (Signed)
Lab Results  Component Value Date   CHOL 167 06/26/2022   HDL 45.50 06/26/2022   LDLCALC 101 (H) 06/26/2022   LDLDIRECT 93.0 11/26/2017   TRIG 102.0 06/26/2022   CHOLHDL 4 06/26/2022   Due for follow up lipid panel. Will update. Continue lipitor.

## 2023-12-14 NOTE — Assessment & Plan Note (Signed)
Reports PSA has come down slightly- this is being monitored by Urology.

## 2023-12-14 NOTE — Progress Notes (Unsigned)
Subjective:     Patient ID: Jimmy Day, male    DOB: 1961/07/03, 63 y.o.   MRN: [id number]  Chief Complaint  Patient presents with   Insomnia    Here for follow up   Tinnitus    Here for follow up    HPI  Discussed the use of AI scribe software for clinical note transcription with the patient, who gave verbal consent to proceed.  History of Present Illness   The patient, a veteran with a history of multiple myeloma and tinnitus, presents with insomnia. He reports difficulty staying asleep once he wakes up, describing it as 'intense.' He has been using hydroxyzine, an anxiety medication, to help with sleep but reports only minimal improvement. He requests a stronger sleep aid.  The patient also reports a recent hearing aid fitting, but notes no improvement in his tinnitus. He is currently not pursuing treatment for his multiple myeloma, stating he 'feels good.' His mood is reported as good and he believes his Wellbutrin is helpful. He is also on losartan for blood pressure control, which was within normal limits at this visit.  He is taking both Cialis and Viagra for erectile dysfunction, under the guidance of a urologist. He recently had a prostate exam and his PSA levels have decreased from 5.8 to 4.6. He attributes this improvement to dietary changes, including daily smoothies with fruits, vegetables, raw eggs, broccoli, spinach, pumpkin seeds, and walnuts.  The patient recently started a new job with a [employer], which he enjoys. He works four days a week, alternating between three and four days off. He is also under the care of a kidney specialist, who reports his kidney function is normal. He has been receiving B12 injections in the office, which he reports make a noticeable difference in his energy levels.          Health Maintenance Due  Topic Date Due   Pneumococcal Vaccine 82-49 Years old (3 of 3 - PPSV23 or PCV20) 03/01/2022   COVID-19 Vaccine (5 - 2024-25  season) 07/25/2023    Past Medical History:  Diagnosis Date   Arthritis    spine   Elevated PSA 11/29/2017   Fasting hyperglycemia    NORMAL A1c   History of COVID-19 2020   Hyperlipidemia    Hypertension    Kappa light chain myeloma (HCC) 02/02/2014   Norovirus 09/20/2022   Pneumonia    during covid in 2020   Testosterone deficiency    Dr Marcello Fennel    Past Surgical History:  Procedure Laterality Date   ANTERIOR CERVICAL DECOMP/DISCECTOMY FUSION N/A 04/30/2021   Procedure: Anterior Cervical Decompression Fusion Cervical three-four, Cervical four-five, Cervical six-seven;  Surgeon: Bedelia Person, MD;  Location: [facility] OR;  Service: Neurosurgery;  Laterality: N/A;   CERVICAL PLATE INSERTION     POST COMPRESSION DISC INJURY   LIPOMAS REMOVAL     BENIGN FROM CHEST AND BACK    Family History  Problem Relation Age of Onset   Breast cancer Mother    Heart attack Father 83   Kidney disease Brother        RENAL FAILURE   Hypertension Brother        (died from sepsis at 30)   Heart attack Maternal Aunt 95       CABG   Stroke Maternal Uncle        CVA   Heart attack Paternal Grandmother    Diabetes Neg Hx    Colon cancer Neg  Hx    Esophageal cancer Neg Hx    Rectal cancer Neg Hx    Stomach cancer Neg Hx    Colon polyps Neg Hx     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  Tobacco Use   Smoking status: Never   Smokeless tobacco: Never   Tobacco comments:    never used tobacco  Vaping Use   Vaping status: Never Used  Substance and Sexual Activity   Alcohol use: Not Currently    Comment:  rarely   Drug use: No   Sexual activity: Yes    Birth control/protection: Condom  Other Topics Concern   Not on file  Social History Narrative    Son in [place]   Son in [place]   9 grandchildren (6 grandsons, 3 grandchildren)   Daughter at [employer]   Married   Truck driver   Enjoys exercise, basketball, watch sports, 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, dispensing   Social Drivers of Corporate investment banker Strain: Not on file  Food Insecurity: No Food Insecurity (09/10/2022)   Hunger Vital Sign    Worried About Running Out of Food in the Last Year: Never true    Ran Out of Food in the Last Year: Never true  Transportation Needs: No Transportation Needs (09/10/2022)   PRAPARE - Administrator, Civil Service (Medical): No    Lack of Transportation (Non-Medical): No  Physical Activity: Not on file  Stress: Not on file  Social Connections: Unknown (04/05/2022)   Received from [facility], [facility]   Social Network    Social Network: Not on file  Intimate Partner Violence: Not At Risk (09/10/2022)   Humiliation, Afraid, Rape, and Kick questionnaire    Fear of Current or Ex-Partner: No    Emotionally Abused: No    Physically Abused: No    Sexually Abused: No    Outpatient Medications Prior to Visit  Medication Sig Dispense Refill   atorvastatin (LIPITOR) 10 MG tablet Take 1 tablet (10 mg total) by mouth daily. 90 tablet 3   buPROPion (WELLBUTRIN XL) 150 MG 24 hr tablet Take 1 tablet (150 mg total) by mouth daily. 90 tablet 1   cholecalciferol (VITAMIN D3) 25 MCG (1000 UT) tablet Take 1 tablet (1,000 Units total) by mouth daily.     losartan (COZAAR) 50 MG tablet Take 1 tablet (50 mg total) by mouth daily. 90 tablet 3   omega-3 acid ethyl esters (LOVAZA) 1 g capsule Take 2 capsules (2 g total) by mouth 2 (two) times daily. 120 capsule 5   tadalafil (CIALIS) 5 MG tablet Take 1 tablet (5 mg total) by mouth daily. 90 tablet 3   tamsulosin (FLOMAX) 0.4 MG CAPS capsule Take 1 capsule (0.4 mg total) by mouth daily. 90 capsule 3   VIAGRA 100 MG tablet Take 0.5 tablets (50 mg total) by mouth daily  as needed (take 1/2 tablet by mouth daily as needed for erectile dysfunction). (Patient taking differently: Take 50 mg by mouth daily as needed for erectile dysfunction (take 1/2 tablet by mouth daily as needed for erectile dysfunction).) 10 tablet 1   No facility-administered medications prior to visit.  No Known Allergies  ROS See HPI    Objective:    Physical Exam Constitutional:      General: He is not in acute distress.    Appearance: He is well-developed.  HENT:     Head: Normocephalic and atraumatic.  Cardiovascular:     Rate and Rhythm: Normal rate and regular rhythm.     Heart sounds: No murmur heard. Pulmonary:     Effort: Pulmonary effort is normal. No respiratory distress.     Breath sounds: Normal breath sounds. No wheezing or rales.  Skin:    General: Skin is warm and dry.  Neurological:     Mental Status: He is alert and oriented to person, place, and time.  Psychiatric:        Behavior: Behavior normal.        Thought Content: Thought content normal.      BP 127/72 (BP Location: Right Arm, Patient Position: Sitting, Cuff Size: Large)   Pulse 61   Temp 97.7 F (36.5 C) (Oral)   Ht 6' (1.829 m)   Wt 200 lb (90.7 kg)   BMI 27.12 kg/m  Wt Readings from Last 3 Encounters:  12/14/23 200 lb (90.7 kg)  12/08/23 201 lb (91.2 kg)  08/10/23 190 lb (86.2 kg)       Assessment & Plan:   Problem List Items Addressed This Visit       Unprioritized   Major depressive disorder, recurrent, moderate (HCC) - Primary   Notes improvement in mood with wellbutrin.       Relevant Medications   hydrOXYzine (VISTARIL) 25 MG capsule   Insomnia   He was given rx for hydroxyzine 10mg  bid prn for anxiety. Will increase to 25mg  bid prn. May use at bedtime for sleep.       Hyperlipidemia   Lab Results  Component Value Date   CHOL 167 06/26/2022   HDL 45.50 06/26/2022   LDLCALC 101 (H) 06/26/2022   LDLDIRECT 93.0 11/26/2017   TRIG 102.0 06/26/2022   CHOLHDL 4  06/26/2022   Due for follow up lipid panel. Will update. Continue lipitor.       Relevant Orders   Lipid panel (Completed)   Essential hypertension   BP Readings from Last 3 Encounters:  12/14/23 127/72  12/08/23 118/80  08/10/23 125/75   BP stable on losartan 50mg . Continue same.       Erectile dysfunction   Stable on cialis/Viagra. Seeing Urology.       Elevated PSA   Reports PSA has come down slightly- this is being monitored by Urology.       Chronic kidney disease, stage 3a [facility])   Lab Results  Component Value Date   CREATININE 1.43 08/10/2023   Follows with [facility]- Dr. Verna Czech.       Relevant Orders   Comp Met (CMET) (Completed)   B12 deficiency   He has not had injection recently- will resume today. His schedule with his new job will make it easier to come in.       I am having Izola Price. Skaff start on hydrOXYzine. I am also having him maintain his cholecalciferol, tadalafil, buPROPion, omega-3 acid ethyl esters, Viagra, losartan, atorvastatin, and tamsulosin. We administered cyanocobalamin.  Meds ordered this encounter  Medications   hydrOXYzine (VISTARIL) 25 MG capsule    Sig: Take 1 capsule (25 mg total) by mouth 2 (two) times daily as needed.    Dispense:  60 capsule    Refill:  1    Supervising Provider:   Danise Edge A [[id number]]   cyanocobalamin (VITAMIN B12) injection 1,000 mcg

## 2023-12-14 NOTE — Assessment & Plan Note (Signed)
Notes improvement in mood with wellbutrin.

## 2023-12-14 NOTE — Assessment & Plan Note (Signed)
BP Readings from Last 3 Encounters:  12/14/23 127/72  12/08/23 118/80  08/10/23 125/75   BP stable on losartan 50mg . Continue same.

## 2023-12-15 ENCOUNTER — Encounter: Payer: Self-pay | Admitting: Family

## 2023-12-15 DIAGNOSIS — G4700 Insomnia, unspecified: Secondary | ICD-10-CM | POA: Insufficient documentation

## 2023-12-15 LAB — COMPREHENSIVE METABOLIC PANEL
AG Ratio: 1.5 (calc) (ref 1.0–2.5)
ALT: 14 U/L (ref 9–46)
AST: 15 U/L (ref 10–35)
Albumin: 4.4 g/dL (ref 3.6–5.1)
Alkaline phosphatase (APISO): 54 U/L (ref 35–144)
BUN/Creatinine Ratio: 10 (calc) (ref 6–22)
BUN: 16 mg/dL (ref 7–25)
CO2: 26 mmol/L (ref 20–32)
Calcium: 9.9 mg/dL (ref 8.6–10.3)
Chloride: 104 mmol/L (ref 98–110)
Creat: 1.63 mg/dL — ABNORMAL HIGH (ref 0.70–1.35)
Globulin: 2.9 g/dL (ref 1.9–3.7)
Glucose, Bld: 78 mg/dL (ref 65–99)
Potassium: 4.3 mmol/L (ref 3.5–5.3)
Sodium: 138 mmol/L (ref 135–146)
Total Bilirubin: 0.4 mg/dL (ref 0.2–1.2)
Total Protein: 7.3 g/dL (ref 6.1–8.1)

## 2023-12-15 LAB — LIPID PANEL
Cholesterol: 146 mg/dL (ref <200)
HDL: 40 mg/dL (ref >=40)
LDL Cholesterol (Calc): 78 mg/dL
Non-HDL Cholesterol (Calc): 106 mg/dL (ref <130)
Total CHOL/HDL Ratio: 3.7 (calc) (ref <5.0)
Triglycerides: 184 mg/dL — ABNORMAL HIGH (ref <150)

## 2023-12-15 NOTE — Assessment & Plan Note (Signed)
He was given rx for hydroxyzine 10mg  bid prn for anxiety. Will increase to 25mg  bid prn. May use at bedtime for sleep.

## 2023-12-15 NOTE — Patient Instructions (Signed)
VISIT SUMMARY:  During today's visit, we discussed your ongoing health concerns, including insomnia, tinnitus, multiple myeloma, hypertension, hyperlipidemia, erectile dysfunction, and vitamin B12 deficiency. We reviewed your current medications and made some adjustments to better manage your symptoms. Your recent lifestyle changes and new job were also noted.  YOUR PLAN:  -INSOMNIA: Insomnia is a condition where you have trouble falling or staying asleep. We will increase your hydroxyzine dose to 25mg  at bedtime as needed to help improve your sleep.  -TINNITUS: Tinnitus is a ringing or buzzing noise in one or both ears that may be constant or come and go. Continue using your hearing aids as currently managed.  -MULTIPLE MYELOMA: Multiple myeloma is a type of blood cancer that affects plasma cells. Since you are currently asymptomatic, we will continue with the current management plan.  -HYPERTENSION: Hypertension is high blood pressure. Your blood pressure is well controlled with Losartan 50mg  daily, so continue taking this medication as prescribed.  -HYPERLIPIDEMIA: Hyperlipidemia is having high levels of fats (lipids) in your blood, such as cholesterol. We will order a lipid panel today to check your cholesterol levels.  -ERECTILE DYSFUNCTION: Erectile dysfunction is the inability to get or keep an erection firm enough for sex. Continue taking Cialis daily and Viagra as needed, and follow up with your urologist.  -VITAMIN B12 DEFICIENCY: Vitamin B12 deficiency occurs when your body does not have enough of this vitamin, which is important for nerve function and the production of red blood cells. We will administer a B12 injection today.  INSTRUCTIONS:  Please follow up in 3 months. Additionally, we will order a lipid panel today to check your cholesterol levels.

## 2023-12-19 ENCOUNTER — Encounter: Payer: Self-pay | Admitting: Family

## 2023-12-24 ENCOUNTER — Ambulatory Visit: Payer: 59 | Admitting: Cardiology

## 2024-01-14 ENCOUNTER — Other Ambulatory Visit (HOSPITAL_BASED_OUTPATIENT_CLINIC_OR_DEPARTMENT_OTHER): Payer: Self-pay

## 2024-01-14 ENCOUNTER — Encounter: Payer: Self-pay | Admitting: Hematology & Oncology

## 2024-01-14 ENCOUNTER — Ambulatory Visit: Payer: BC Managed Care – PPO | Admitting: Emergency Medicine

## 2024-01-14 DIAGNOSIS — E538 Deficiency of other specified B group vitamins: Secondary | ICD-10-CM

## 2024-01-14 MED ORDER — CYANOCOBALAMIN 1000 MCG/ML IJ SOLN
1000.0000 ug | Freq: Once | INTRAMUSCULAR | Status: AC
  Administered 2024-01-14: 1000 ug via INTRAMUSCULAR

## 2024-01-14 NOTE — Progress Notes (Signed)
 Patient here for monthly b12 injection per physicians order.  Injection given in left deltoid and patient tolerated well.

## 2024-02-08 ENCOUNTER — Other Ambulatory Visit (HOSPITAL_BASED_OUTPATIENT_CLINIC_OR_DEPARTMENT_OTHER): Payer: Self-pay

## 2024-02-09 ENCOUNTER — Encounter: Payer: Self-pay | Admitting: Hematology & Oncology

## 2024-02-09 ENCOUNTER — Other Ambulatory Visit (HOSPITAL_BASED_OUTPATIENT_CLINIC_OR_DEPARTMENT_OTHER): Payer: Self-pay

## 2024-02-09 MED ORDER — TADALAFIL 5 MG PO TABS
5.0000 mg | ORAL_TABLET | Freq: Every day | ORAL | 3 refills | Status: AC
  Filled 2024-02-09 – 2024-02-25 (×2): qty 90, 90d supply, fill #0
  Filled 2024-05-25: qty 90, 90d supply, fill #1
  Filled 2024-08-25: qty 90, 90d supply, fill #2
  Filled 2024-11-22: qty 90, 90d supply, fill #3

## 2024-02-17 ENCOUNTER — Ambulatory Visit: Payer: BC Managed Care – PPO

## 2024-02-17 NOTE — Progress Notes (Deleted)
 Patient is here for a vitamin B12 injection per orders from Melissa:  "Please contact pt to schedule monthly b12 injection"  Last injection: 01/14/2024 Denies gastrointestinal problems or dizziness.  B12 injection to *** deltoid with no apparent complications.  Scheduled next injection in one month: ***.

## 2024-02-22 ENCOUNTER — Other Ambulatory Visit (HOSPITAL_BASED_OUTPATIENT_CLINIC_OR_DEPARTMENT_OTHER): Payer: Self-pay

## 2024-02-25 ENCOUNTER — Other Ambulatory Visit (HOSPITAL_BASED_OUTPATIENT_CLINIC_OR_DEPARTMENT_OTHER): Payer: Self-pay

## 2024-02-25 ENCOUNTER — Other Ambulatory Visit: Payer: Self-pay | Admitting: Family

## 2024-02-25 DIAGNOSIS — Z0189 Encounter for other specified special examinations: Secondary | ICD-10-CM | POA: Diagnosis not present

## 2024-02-26 ENCOUNTER — Other Ambulatory Visit (HOSPITAL_BASED_OUTPATIENT_CLINIC_OR_DEPARTMENT_OTHER): Payer: Self-pay

## 2024-03-15 ENCOUNTER — Ambulatory Visit: Payer: BC Managed Care – PPO | Admitting: Family

## 2024-03-15 ENCOUNTER — Other Ambulatory Visit (HOSPITAL_BASED_OUTPATIENT_CLINIC_OR_DEPARTMENT_OTHER): Payer: Self-pay

## 2024-03-15 VITALS — BP 115/64 | HR 59 | Temp 98.3°F | Resp 16 | Ht 72.0 in | Wt 202.0 lb

## 2024-03-15 DIAGNOSIS — N1831 Chronic kidney disease, stage 3a: Secondary | ICD-10-CM

## 2024-03-15 DIAGNOSIS — E559 Vitamin D deficiency, unspecified: Secondary | ICD-10-CM | POA: Diagnosis not present

## 2024-03-15 DIAGNOSIS — F32A Depression, unspecified: Secondary | ICD-10-CM

## 2024-03-15 DIAGNOSIS — R972 Elevated prostate specific antigen [PSA]: Secondary | ICD-10-CM

## 2024-03-15 DIAGNOSIS — C9000 Multiple myeloma not having achieved remission: Secondary | ICD-10-CM

## 2024-03-15 DIAGNOSIS — E782 Mixed hyperlipidemia: Secondary | ICD-10-CM

## 2024-03-15 DIAGNOSIS — E538 Deficiency of other specified B group vitamins: Secondary | ICD-10-CM | POA: Diagnosis not present

## 2024-03-15 DIAGNOSIS — N529 Male erectile dysfunction, unspecified: Secondary | ICD-10-CM

## 2024-03-15 DIAGNOSIS — I10 Essential (primary) hypertension: Secondary | ICD-10-CM

## 2024-03-15 MED ORDER — CYANOCOBALAMIN 1000 MCG/ML IJ SOLN
1000.0000 ug | Freq: Once | INTRAMUSCULAR | Status: AC
  Administered 2024-03-15: 1000 ug via INTRAMUSCULAR

## 2024-03-15 NOTE — Patient Instructions (Signed)
 VISIT SUMMARY:  Today, you came in for a routine follow-up to discuss your ongoing medical conditions. We reviewed your hearing aids, kidney function, mood management, prostate health, blood pressure, cholesterol levels, and vitamin D  levels.  YOUR PLAN:  -CHRONIC KIDNEY DISEASE, STAGE 3: Chronic kidney disease stage 3 means your kidneys are moderately damaged and not working as well as they should. Your GFR has decreased to 52. Continue to stay hydrated and maintain good blood pressure control. We will order kidney function tests to monitor your condition.  -HYPERTENSION: Hypertension means high blood pressure. Your blood pressure is well controlled with your current medication. Keeping your blood pressure in check is important to protect your kidney function.  -HYPERLIPIDEMIA: Hyperlipidemia means you have high levels of fats (lipids) in your blood. Your cholesterol levels are generally good, but your HDL (good cholesterol) could be higher, and your triglycerides are slightly elevated. Continue taking Lipitor and Lovaza  as prescribed.  -DEPRESSION: Depression is a mood disorder. Your mood has improved with Wellbutrin , and the current dose is effective. We discussed that there are no side effects affecting your ability to drive.  -MULTIPLE MYELOMA: Multiple myeloma is a type of blood cancer. You are not currently undergoing treatment and feel well. You are aware that you can return to your hematologist if your symptoms worsen.  -VITAMIN D  DEFICIENCY: Vitamin D  deficiency means you have low levels of vitamin D . You are taking 1000 IU of vitamin D  daily. We will order a vitamin D  level test to monitor your condition.  INSTRUCTIONS:  Please follow up with the lab to have your kidney function and vitamin D  levels rechecked. Continue to stay hydrated, take your medications as prescribed, and monitor your health. If you notice any changes or have concerns, please contact our office.

## 2024-03-15 NOTE — Assessment & Plan Note (Signed)
 B12 injection today

## 2024-03-15 NOTE — Assessment & Plan Note (Signed)
 Declining further treatment. Feels well. Aware of option to return to hematologist if symptoms worsen.

## 2024-03-15 NOTE — Assessment & Plan Note (Addendum)
 Reports that urology told him PSA is stable and is being followed by Urology, Dr. Sherrine Dolly who is checking every 6 months.

## 2024-03-15 NOTE — Progress Notes (Unsigned)
 Subjective:     Patient ID: Jimmy Day, male    DOB: 1961-08-12, 63 y.o.   MRN: [id number]  Chief Complaint  Patient presents with   Anxiety    Here for follow up "doing well"   Depression    Here for follow up    Anxiety    Depression        Past medical history includes anxiety.     Discussed the use of AI scribe software for clinical note transcription with the patient, who gave verbal consent to proceed.  History of Present Illness  Jimmy Day is a 63 year old male who presents for routine follow-up of multiple medical conditions.  He recently obtained hearing aids but has not experienced the expected improvement in hearing. It was mentioned that it might take four weeks to notice a difference, but there has been no change yet.  He is managing multiple myeloma without active treatment and feels well. He is not currently following up with his previous specialist for labs or other evaluations.  He continues to monitor his kidney function with his nephrologist. His recent GFR was reportedly 52, down from 60. He notes that his GFR fluctuates and attributes some of this to hydration levels. He tries to stay hydrated by drinking water regularly, especially in the morning.  He is taking Wellbutrin  for mood management and reports feeling 'lovely' on the current dose, which he finds effective without causing any issues that might affect his ability to drive.  He is under urological care for monitoring his PSA levels, which have been stable and slightly decreased. He had a prostate biopsy less than five years ago. He takes Cialis  daily and Viagra  as needed, which he finds effective.  For hypertension, he is on losartan  and reports that his blood pressure is well-controlled. He is also on Lipitor for cholesterol management, with his last lipid panel showing good total and LDL cholesterol levels, though his HDL is at 40, which he notes is the highest it has been. He is taking  Lovaza  for elevated triglycerides.  His vitamin D  levels were low in September, and he is taking over-the-counter vitamin D  at 1000 units daily. He will have his vitamin D  and kidney function rechecked today.     Health Maintenance Due  Topic Date Due   Pneumococcal Vaccine 69-60 Years old (3 of 3 - PPSV23, PCV20 or PCV21) 03/01/2022   COVID-19 Vaccine (5 - 2024-25 season) 07/25/2023    Past Medical History:  Diagnosis Date   Arthritis    spine   Elevated PSA 11/29/2017   Fasting hyperglycemia    NORMAL A1c   History of COVID-19 2020   Hyperlipidemia    Hypertension    Kappa light chain myeloma (HCC) 02/02/2014   Norovirus 09/20/2022   Pneumonia    during covid in 2020   Testosterone  deficiency    Dr Jimmy Day    Past Surgical History:  Procedure Laterality Date   ANTERIOR CERVICAL DECOMP/DISCECTOMY FUSION N/A 04/30/2021   Procedure: Anterior Cervical Decompression Fusion Cervical three-four, Cervical four-five, Cervical six-seven;  Surgeon: Jimmy Gelinas, MD;  Location: [facility] OR;  Service: Neurosurgery;  Laterality: N/A;   CERVICAL PLATE INSERTION     POST COMPRESSION DISC INJURY   LIPOMAS REMOVAL     BENIGN FROM CHEST AND BACK    Family History  Problem Relation Age of Onset   Breast cancer Mother    Heart attack Father 105   Kidney disease  Brother        RENAL FAILURE   Hypertension Brother        (died from sepsis at 105)   Heart attack Maternal Aunt 71       CABG   Stroke Maternal Uncle        CVA   Heart attack Paternal Grandmother    Diabetes Neg Hx    Colon cancer Neg Hx    Esophageal cancer Neg Hx    Rectal cancer Neg Hx    Stomach cancer Neg Hx    Colon polyps Neg Hx     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   Tobacco Use   Smoking status: Never   Smokeless tobacco: Never   Tobacco comments:    never used tobacco  Vaping Use   Vaping status: Never Used  Substance and Sexual Activity   Alcohol use: Not Currently    Comment:  rarely   Drug use: No   Sexual activity: Yes    Birth control/protection: Condom  Other Topics Concern   Not on file  Social History Narrative   Son in [place]    Son in [place]   9 grandchildren (6 grandsons, 3 grandchildren)   Daughter at [employer]   Married   Truck driver   Enjoys exercise, basketball, watch sports, 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, dispensing   Social Drivers of Corporate investment banker Strain: Not on file  Food Insecurity: No Food Insecurity (09/10/2022)   Hunger Vital Sign    Worried About Running Out of Food in the Last Year: Never true    Ran Out of Food in the Last Year: Never true  Transportation Needs: No Transportation Needs (09/10/2022)   PRAPARE - Administrator, Civil Service (Medical): No    Lack of Transportation (Non-Medical): No  Physical Activity: Not on file  Stress: Not on file  Social Connections: Unknown (04/05/2022)   Received from [facility], [facility]   Social Network    Social Network: Not on file  Intimate Partner Violence: Not At Risk (09/10/2022)   Humiliation, Afraid, Rape, and Kick questionnaire    Fear of Current or Ex-Partner: No    Emotionally Abused: No    Physically Abused: No    Sexually Abused: No    Outpatient Medications Prior to Visit  Medication Sig Dispense Refill   atorvastatin  (LIPITOR) 10 MG tablet Take 1 tablet (10 mg total) by mouth daily. 90 tablet 3   buPROPion  (WELLBUTRIN  XL) 150 MG 24 hr tablet Take 1 tablet (150 mg total) by mouth daily. 90 tablet 1   cholecalciferol  (VITAMIN D3) 25 MCG (1000 UT) tablet Take 1 tablet (1,000 Units total) by mouth daily.     hydrOXYzine  (VISTARIL ) 25 MG capsule Take 1 capsule (25 mg total) by mouth 2 (two) times  daily as needed. 60 capsule 1   losartan  (COZAAR ) 50 MG tablet Take 1 tablet (50 mg total) by mouth daily. 90 tablet 3   omega-3 acid ethyl esters (LOVAZA ) 1 g capsule Take 2 capsules (2 g total) by mouth 2 (two) times daily. 120 capsule 5   tadalafil  (CIALIS ) 5 MG tablet Take 1  tablet (5 mg total) by mouth daily. 90 tablet 3   tamsulosin  (FLOMAX ) 0.4 MG CAPS capsule Take 1 capsule (0.4 mg total) by mouth daily. 90 capsule 3   VIAGRA  100 MG tablet Take 0.5 tablets (50 mg total) by mouth daily as needed (take 1/2 tablet by mouth daily as needed for erectile dysfunction). (Patient taking differently: Take 50 mg by mouth daily as needed for erectile dysfunction (take 1/2 tablet by mouth daily as needed for erectile dysfunction).) 10 tablet 1   No facility-administered medications prior to visit.    No Known Allergies  Review of Systems  Psychiatric/Behavioral:  Positive for depression.        Objective:    Physical Exam Constitutional:      General: He is not in acute distress.    Appearance: He is well-developed.  HENT:     Head: Normocephalic and atraumatic.  Cardiovascular:     Rate and Rhythm: Normal rate and regular rhythm.     Heart sounds: No murmur heard. Pulmonary:     Effort: Pulmonary effort is normal. No respiratory distress.     Breath sounds: Normal breath sounds. No wheezing or rales.  Skin:    General: Skin is warm and dry.  Neurological:     Mental Status: He is alert and oriented to person, place, and time.  Psychiatric:        Behavior: Behavior normal.        Thought Content: Thought content normal.      BP 115/64 (BP Location: Right Arm, Patient Position: Sitting, Cuff Size: Large)   Pulse (!) 59   Temp 98.3 F (36.8 C) (Oral)   Resp 16   Ht 6' (1.829 m)   Wt 202 lb (91.6 kg)   SpO2 100%   BMI 27.40 kg/m  Wt Readings from Last 3 Encounters:  03/15/24 202 lb (91.6 kg)  12/14/23 200 lb (90.7 kg)  12/08/23 201 lb (91.2 kg)       Assessment &  Plan:   Problem List Items Addressed This Visit       Unprioritized   Kappa light chain myeloma (HCC) (Chronic)   Declining further treatment. Feels well. Aware of option to return to hematologist if symptoms worsen.      Vitamin D  deficiency   Taking 1000 international units daily otc. Update level.       Relevant Orders   Vitamin D  (25 hydroxy)   Hyperlipidemia   Lab Results  Component Value Date   CHOL 146 12/14/2023   HDL 40 12/14/2023   LDLCALC 78 12/14/2023   LDLDIRECT 93.0 11/26/2017   TRIG 184 (H) 12/14/2023   CHOLHDL 3.7 12/14/2023   LDL stable.  Continues lovaza  capsules.       Essential hypertension   Stable on losartan , continue same.       Erectile dysfunction   Stable with daily cialis  and prn viagra .       Elevated PSA   Reports that urology told him PSA is stable and is being followed by Urology, Dr. Sherrine Dolly who is checking every 6 months.       Depression   Mood stable on Wellbutrin  XL.  Continue same.       Chronic kidney disease, stage 3a (HCC)   Reports last GFR 52. Followed with Nephrology.       Relevant Orders   Basic Metabolic Panel (BMET)   B12 deficiency - Primary   B12 injection today.  I am having Baxter Bott. Kopec maintain his cholecalciferol , buPROPion , omega-3 acid ethyl esters, Viagra , losartan , atorvastatin , tamsulosin , hydrOXYzine , and tadalafil . We administered cyanocobalamin .  Meds ordered this encounter  Medications   cyanocobalamin  (VITAMIN B12) injection 1,000 mcg

## 2024-03-15 NOTE — Assessment & Plan Note (Signed)
 Reports last GFR 52. Followed with Nephrology.

## 2024-03-15 NOTE — Assessment & Plan Note (Signed)
 Stable with daily cialis  and prn viagra .

## 2024-03-15 NOTE — Assessment & Plan Note (Signed)
 Lab Results  Component Value Date   CHOL 146 12/14/2023   HDL 40 12/14/2023   LDLCALC 78 12/14/2023   LDLDIRECT 93.0 11/26/2017   TRIG 184 (H) 12/14/2023   CHOLHDL 3.7 12/14/2023   LDL stable.  Continues lovaza  capsules.

## 2024-03-15 NOTE — Assessment & Plan Note (Signed)
 Mood stable on Wellbutrin  XL.  Continue same.

## 2024-03-15 NOTE — Assessment & Plan Note (Signed)
Stable on losartan, continue same.  

## 2024-03-15 NOTE — Assessment & Plan Note (Signed)
 Taking 1000 international units daily otc. Update level.

## 2024-03-16 ENCOUNTER — Encounter: Payer: Self-pay | Admitting: Family

## 2024-03-16 LAB — BASIC METABOLIC PANEL WITH GFR
BUN: 12 mg/dL (ref 6–23)
CO2: 30 meq/L (ref 19–32)
Calcium: 9.6 mg/dL (ref 8.4–10.5)
Chloride: 101 meq/L (ref 96–112)
Creatinine, Ser: 1.31 mg/dL (ref 0.40–1.50)
GFR: 58.1 mL/min — ABNORMAL LOW (ref >60.00)
Glucose, Bld: 81 mg/dL (ref 70–99)
Potassium: 4.5 meq/L (ref 3.5–5.1)
Sodium: 138 meq/L (ref 135–145)

## 2024-03-16 LAB — VITAMIN D 25 HYDROXY (VIT D DEFICIENCY, FRACTURES): VITD: 37.55 ng/mL (ref 30.00–100.00)

## 2024-04-27 ENCOUNTER — Encounter (HOSPITAL_BASED_OUTPATIENT_CLINIC_OR_DEPARTMENT_OTHER): Payer: Self-pay | Admitting: Emergency Medicine

## 2024-04-27 ENCOUNTER — Emergency Department (HOSPITAL_BASED_OUTPATIENT_CLINIC_OR_DEPARTMENT_OTHER)

## 2024-04-27 ENCOUNTER — Other Ambulatory Visit (HOSPITAL_BASED_OUTPATIENT_CLINIC_OR_DEPARTMENT_OTHER): Payer: Self-pay

## 2024-04-27 ENCOUNTER — Other Ambulatory Visit: Payer: Self-pay

## 2024-04-27 ENCOUNTER — Emergency Department (HOSPITAL_BASED_OUTPATIENT_CLINIC_OR_DEPARTMENT_OTHER)
Admission: EM | Admit: 2024-04-27 | Discharge: 2024-04-27 | Disposition: A | Discharge status: Home / Self Care | Attending: Emergency Medicine | Admitting: Emergency Medicine

## 2024-04-27 DIAGNOSIS — N1831 Chronic kidney disease, stage 3a: Secondary | ICD-10-CM | POA: Diagnosis not present

## 2024-04-27 DIAGNOSIS — I129 Hypertensive chronic kidney disease with stage 1 through stage 4 chronic kidney disease, or unspecified chronic kidney disease: Secondary | ICD-10-CM | POA: Diagnosis not present

## 2024-04-27 DIAGNOSIS — M1611 Unilateral primary osteoarthritis, right hip: Secondary | ICD-10-CM | POA: Insufficient documentation

## 2024-04-27 DIAGNOSIS — Z8579 Personal history of other malignant neoplasms of lymphoid, hematopoietic and related tissues: Secondary | ICD-10-CM | POA: Insufficient documentation

## 2024-04-27 DIAGNOSIS — M160 Bilateral primary osteoarthritis of hip: Secondary | ICD-10-CM | POA: Diagnosis not present

## 2024-04-27 DIAGNOSIS — Z8616 Personal history of COVID-19: Secondary | ICD-10-CM | POA: Insufficient documentation

## 2024-04-27 DIAGNOSIS — M25551 Pain in right hip: Secondary | ICD-10-CM

## 2024-04-27 MED ORDER — IBUPROFEN 600 MG PO TABS
600.0000 mg | ORAL_TABLET | Freq: Four times a day (QID) | ORAL | 0 refills | Status: DC | PRN
  Filled 2024-04-27: qty 30, 8d supply, fill #0

## 2024-04-27 MED ORDER — CYCLOBENZAPRINE HCL 10 MG PO TABS
10.0000 mg | ORAL_TABLET | Freq: Two times a day (BID) | ORAL | 0 refills | Status: DC | PRN
  Filled 2024-04-27: qty 20, 10d supply, fill #0

## 2024-04-27 MED ORDER — LIDOCAINE 5 % EX PTCH
1.0000 | MEDICATED_PATCH | Freq: Once | CUTANEOUS | Status: DC
  Administered 2024-04-27: 1 via TRANSDERMAL
  Filled 2024-04-27: qty 1

## 2024-04-27 MED ORDER — LIDOCAINE 5 % EX PTCH
1.0000 | MEDICATED_PATCH | Freq: Every day | CUTANEOUS | 0 refills | Status: DC | PRN
  Filled 2024-04-27: qty 15, 15d supply, fill #0

## 2024-04-27 MED ORDER — HYDROCODONE-ACETAMINOPHEN 5-325 MG PO TABS
1.0000 | ORAL_TABLET | Freq: Once | ORAL | Status: AC
  Administered 2024-04-27: 1 via ORAL
  Filled 2024-04-27: qty 1

## 2024-04-27 MED ORDER — OXYCODONE HCL 5 MG PO TABS
5.0000 mg | ORAL_TABLET | ORAL | 0 refills | Status: DC | PRN
  Filled 2024-04-27: qty 10, 2d supply, fill #0

## 2024-04-27 NOTE — ED Triage Notes (Signed)
 Right hip pain with instability since Tuesday.  No known injury.  Denies fever.

## 2024-04-27 NOTE — ED Provider Notes (Signed)
 [facility] Provider Note  CSN: [id number] Arrival date & time: 04/27/24 1023  Chief Complaint(s) Hip Pain  HPI Jimmy Day is a 63 y.o. male with past medical history as below, significant for arthritis, hypertension, kappa light chain myeloma, HLD who presents to the ED with complaint of right hip pain  Hip pain ongoing over the past 3 days.  Began while he was getting out of vehicle.  Pain localized to the hip, primarily to the lateral aspect.  Does not radiate down his leg or towards his back.  No numbness, tingling or weakness to his effected extremity.  No falls or injuries.  Pain did improve with over-the-counter arthritis medication.  He was able to mow the lawn yesterday but after he finished he had worsening pain to his right leg.  Feels like he is unstable on his right leg secondary to the pain.  No prior injection to the affected extremity, no prior surgery to the affected extremity.  Fevers or chills.  Does not follow orthopedics.  Past Medical History Past Medical History:  Diagnosis Date   Arthritis    spine   Elevated PSA 11/29/2017   Fasting hyperglycemia    NORMAL A1c   History of COVID-19 2020   Hyperlipidemia    Hypertension    Kappa light chain myeloma (HCC) 02/02/2014   Norovirus 09/20/2022   Pneumonia    during covid in 2020   Testosterone  deficiency    Dr Tommas Fragmin   Patient Active Problem List   Diagnosis Date Noted   Insomnia 12/15/2023   Elevated coronary artery calcium  score 66.4 which is 78 percentile 12/08/2023   Depression 12/07/2023   Acne keloid 12/07/2023   Adjustment disorder with anxious mood 12/07/2023   Chronic pain 12/07/2023   Encounter for fitting and adjustment of hearing aid 12/07/2023   Grief 12/07/2023   Major depressive disorder, recurrent, moderate (HCC) 12/07/2023   Unspecified hearing loss, unspecified ear 12/07/2023   Needs flu shot 08/10/2023   Anxiety and depression 10/19/2022    Syncope and collapse 09/10/2022   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   Hemorrhoids 08/18/2022   Chronic kidney disease, stage 3a (HCC) 08/09/2022   Pneumonia of left lung due to infectious organism 08/08/2022   Preventative health care 06/26/2022   Family history of early CAD 06/26/2022   Sensorineural hearing loss (SNHL) of both ears 01/26/2022   Tinnitus, bilateral 01/26/2022   Cervical myelopathy (HCC) 04/30/2021   Elevated PSA 11/29/2017   B12 deficiency 08/13/2017   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 Authorizing Provider  cyclobenzaprine  (FLEXERIL ) 10 MG tablet Take 1 tablet (10 mg total) by mouth 2 (two) times daily as needed for muscle spasms. 04/27/24  Yes Russella Courts A, DO  ibuprofen  (ADVIL ) 600 MG tablet Take 1 tablet (600 mg total) by mouth every 6 (six) hours as needed. 04/27/24  Yes Russella Courts A, DO  lidocaine  (LIDODERM ) 5 % Place 1 patch onto the skin daily as needed.  Remove & Discard patch within 12 hours or as directed by MD 04/27/24  Yes Teddi Favors, DO  oxyCODONE  (ROXICODONE ) 5 MG immediate release tablet Take 1 tablet (5 mg total) by mouth every 4 (four) hours as needed for severe pain (pain score 7-10). 04/27/24  Yes Russella Courts A, DO  atorvastatin  (LIPITOR) 10 MG tablet Take 1 tablet (10 mg total) by mouth daily. 12/08/23   Krasowski, Robert J, MD  buPROPion  (WELLBUTRIN  XL) 150 MG 24 hr tablet Take 1 tablet (150 mg total) by mouth daily.  02/05/23   O'Sullivan, Melissa, NP  cholecalciferol  (VITAMIN D3) 25 MCG (1000 UT) tablet Take 1 tablet (1,000 Units total) by mouth daily. 11/28/18   O'Sullivan, Melissa, NP  hydrOXYzine  (VISTARIL ) 25 MG capsule Take 1 capsule (25 mg total) by mouth 2 (two) times daily as needed. 12/14/23   O'Sullivan, Melissa, NP  losartan  (COZAAR ) 50 MG tablet Take 1 tablet (50 mg total) by mouth daily. 10/28/23     omega-3 acid ethyl esters (LOVAZA ) 1 g capsule Take 2 capsules (2 g total) by mouth 2 (two) times daily. 03/19/23   O'Sullivan, Melissa, NP  tadalafil  (CIALIS ) 5 MG tablet Take 1 tablet (5 mg total) by mouth daily. 02/09/24     tamsulosin  (FLOMAX ) 0.4 MG CAPS capsule Take 1 capsule (0.4 mg total) by mouth daily. 12/09/23     VIAGRA  100 MG tablet Take 0.5 tablets (50 mg total) by mouth daily as needed (take 1/2 tablet by mouth daily as needed for erectile dysfunction). Patient taking differently: Take 50 mg by mouth daily as needed for erectile dysfunction (take 1/2 tablet by mouth daily as needed for erectile dysfunction). 04/07/23   Dorrene Gaucher, NP                                                                                                                                    Past Surgical History Past Surgical History:  Procedure Laterality Date   ANTERIOR CERVICAL DECOMP/DISCECTOMY FUSION N/A 04/30/2021   Procedure: Anterior Cervical Decompression Fusion Cervical three-four, Cervical four-five, Cervical six-seven;  Surgeon: Van Gelinas, MD;  Location: [facility] OR;  Service: Neurosurgery;  Laterality: N/A;   CERVICAL PLATE INSERTION     POST COMPRESSION DISC INJURY   LIPOMAS REMOVAL     BENIGN FROM CHEST AND BACK   Family History Family History  Problem Relation Age of Onset   Breast cancer Mother    Heart attack Father 53   Kidney disease Brother        RENAL FAILURE   Hypertension Brother        (died from sepsis at 61)   Heart attack Maternal Aunt 26       CABG   Stroke Maternal Uncle         CVA   Heart attack Paternal Grandmother    Diabetes Neg Hx    Colon cancer Neg Hx  Esophageal cancer Neg Hx    Rectal cancer Neg Hx    Stomach cancer Neg Hx    Colon polyps Neg Hx     Social History Social History   Tobacco Use   Smoking status: Never   Smokeless tobacco: Never   Tobacco comments:    never used tobacco  Vaping Use   Vaping status: Never Used  Substance Use Topics   Alcohol use: Not Currently    Comment:  rarely   Drug use: No   Allergies Patient has no known allergies.  Review of Systems A thorough review of systems was obtained and all systems are negative except as noted in the HPI and PMH.   Physical Exam Vital Signs  I have reviewed the triage vital signs BP 115/78   Pulse 60   Temp 97.8 F (36.6 C)   Resp 20   Ht 6' (1.829 m)   Wt 91.6 kg   SpO2 98%   BMI 27.40 kg/m  Physical Exam Vitals and nursing note reviewed.  Constitutional:      General: He is not in acute distress.    Appearance: Normal appearance. He is well-developed. He is not ill-appearing.  HENT:     Head: Normocephalic and atraumatic.     Right Ear: External ear normal.     Left Ear: External ear normal.     Nose: Nose normal.     Mouth/Throat:     Mouth: Mucous membranes are moist.  Eyes:     General: No scleral icterus.       Right eye: No discharge.        Left eye: No discharge.  Cardiovascular:     Rate and Rhythm: Normal rate.  Pulmonary:     Effort: Pulmonary effort is normal. No respiratory distress.     Breath sounds: No stridor.  Abdominal:     General: Abdomen is flat. There is no distension.     Tenderness: There is no guarding.  Musculoskeletal:        General: No deformity.     Cervical back: No rigidity.       Legs:     Comments: LE NVI No pain with provocative testing to knee / ankle right lower extremity Strength symmetric lower extremities  Skin:    General: Skin is warm and dry.     Coloration: Skin is not cyanotic, jaundiced  or pale.  Neurological:     Mental Status: He is alert and oriented to person, place, and time.     GCS: GCS eye subscore is 4. GCS verbal subscore is 5. GCS motor subscore is 6.  Psychiatric:        Speech: Speech normal.        Behavior: Behavior normal. Behavior is cooperative.     ED Results and Treatments Labs (all labs ordered are listed, but only abnormal results are displayed) Labs Reviewed - No data to display  Radiology CT Hip Right Wo Contrast Result Date: 04/27/2024 CLINICAL DATA:  Right hip pain for 2 days.  Normal x-rays. EXAM: CT OF THE RIGHT HIP WITHOUT CONTRAST TECHNIQUE: Multidetector CT imaging of the right hip was performed according to the standard protocol. Multiplanar CT image reconstructions were also generated. RADIATION DOSE REDUCTION: This exam was performed according to the departmental dose-optimization program which includes automated exposure control, adjustment of the mA and/or kV according to patient size and/or use of iterative reconstruction technique. COMPARISON:  Radiographs, same date. FINDINGS: The right hip is normally located. No fracture or AVN. Mild degenerative changes. No chondrocalcinosis. No findings suspicious for femoroacetabular impingement. No obvious joint effusion. The visualized left hemipelvic bony structures are intact. There are degenerative changes and areas of partial fusion of right SI joint. Grossly by CT the hip and pelvic musculature are unremarkable. No obvious muscle tear or intramuscular hematoma. No significant intrapelvic abnormalities are identified. Enlarged prostate gland noted. IMPRESSION: 1. Mild degenerative changes but no acute bony findings or AVN. 2. Grossly by CT the hip and pelvic musculature are unremarkable. 3. Enlarged prostate gland. Electronically Signed   By: Marrian Siva M.D.   On: 04/27/2024 12:22    DG Hip Unilat W or Wo Pelvis 2-3 Views Right Result Date: 04/27/2024 CLINICAL DATA:  Right hip pain with instability since Tuesday, no known injury EXAM: DG HIP (WITH OR WITHOUT PELVIS) 2-3V RIGHT COMPARISON:  06/16/2017 FINDINGS: Frontal view of the pelvis as well as frontal and frogleg lateral views of the right hip are obtained. No fracture, subluxation, or dislocation. There is mild bilateral hip osteoarthritis, right greater than left, which has progressed since prior exam. Sacroiliac joints are unremarkable. Soft tissues are normal. IMPRESSION: 1. Mild right greater than left hip osteoarthritis, which has progressed since prior study. 2. No acute bony abnormality. Electronically Signed   By: Bobbye Burrow M.D.   On: 04/27/2024 11:24    Pertinent labs & imaging results that were available during my care of the patient were reviewed by me and considered in my medical decision making (see MDM for details).  Medications Ordered in ED Medications  lidocaine  (LIDODERM ) 5 % 1 patch (1 patch Transdermal Patch Applied 04/27/24 1207)  HYDROcodone -acetaminophen  (NORCO/VICODIN) 5-325 MG per tablet 1 tablet (1 tablet Oral Given 04/27/24 1058)                                                                                                                                     Procedures Procedures  (including critical care time)  Medical Decision Making / ED Course    Medical Decision Making:    KEL SENN is a 63 y.o. male with past medical history as below, significant for arthritis, hypertension, kappa light chain myeloma, HLD who presents to the ED with complaint of right hip pain. The complaint involves an extensive differential diagnosis and also carries with it a high risk  of complications and morbidity.  Serious etiology was considered. Ddx includes but is not limited to: Sprain, strain, fracture, dislocation, ligamentous injury, etc.  Complete initial physical exam performed, notably the  patient was in no distress, sitting on stretcher.    Reviewed and confirmed nursing documentation for past medical history, family history, social history.  Vital signs reviewed.     Brief summary:  63 year old male history above here with right hip pain, nontraumatic We will get x-ray, analgesic LE NVI, low suspicion for septic joint/septic arthritis   Clinical Course as of 04/27/24 1235  Thu Apr 27, 2024  1132 XR w/ right sided osteoarthritis  [SG]  1225 CT stable, no occult fx [SG]    Clinical Course User Index [SG] Russella Courts A, DO     Imaging is stable, does reveal osteoarthritis, no occult fx He is feeling better, he is ambulatory. ROM improved.  Will give patient referral to orthopedics.  Pain control for home.  Strict return precautions  Patient in no distress and overall condition improved here in the ED. Detailed discussions were had with the patient/guardian regarding current findings, and need for close f/u with PCP or on call doctor. The patient/guardian has been instructed to return immediately if the symptoms worsen in any way for re-evaluation. Patient/guardian verbalized understanding and is in agreement with current care plan. All questions answered prior to discharge.              Additional history obtained: -Additional history obtained from family -External records from outside source obtained and reviewed including: Chart review including previous notes, labs, imaging, consultation notes including  Pdmp   Lab Tests: na  EKG   EKG Interpretation Date/Time:    Ventricular Rate:    PR Interval:    QRS Duration:    QT Interval:    QTC Calculation:   R Axis:      Text Interpretation:           Imaging Studies ordered: I ordered imaging studies including xr/ct hip I independently visualized the following imaging with scope of interpretation limited to determining acute life threatening conditions related to emergency care; findings  noted above I agree with the radiologist interpretation If any imaging was obtained with contrast I closely monitored patient for any possible adverse reaction a/w contrast administration in the emergency department   Medicines ordered and prescription drug management: Meds ordered this encounter  Medications   HYDROcodone -acetaminophen  (NORCO/VICODIN) 5-325 MG per tablet 1 tablet    Refill:  0   lidocaine  (LIDODERM ) 5 % 1 patch   cyclobenzaprine  (FLEXERIL ) 10 MG tablet    Sig: Take 1 tablet (10 mg total) by mouth 2 (two) times daily as needed for muscle spasms.    Dispense:  20 tablet    Refill:  0   lidocaine  (LIDODERM ) 5 %    Sig: Place 1 patch onto the skin daily as needed. Remove & Discard patch within 12 hours or as directed by MD    Dispense:  15 patch    Refill:  0   ibuprofen  (ADVIL ) 600 MG tablet    Sig: Take 1 tablet (600 mg total) by mouth every 6 (six) hours as needed.    Dispense:  30 tablet    Refill:  0   oxyCODONE  (ROXICODONE ) 5 MG immediate release tablet    Sig: Take 1 tablet (5 mg total) by mouth every 4 (four) hours as needed for severe pain (pain score 7-10).    Dispense:  10 tablet    Refill:  0    -I have reviewed the patients home medicines and have made adjustments as needed   Consultations Obtained: na   Cardiac Monitoring: Continuous pulse oximetry interpreted by myself, 99% on ra.    Social Determinants of Health:  Diagnosis or treatment significantly limited by social determinants of health: na   Reevaluation: After the interventions noted above, I reevaluated the patient and found that they have improved  Co morbidities that complicate the patient evaluation  Past Medical History:  Diagnosis Date   Arthritis    spine   Elevated PSA 11/29/2017   Fasting hyperglycemia    NORMAL A1c   History of COVID-19 2020   Hyperlipidemia    Hypertension    Kappa light chain myeloma (HCC) 02/02/2014   Norovirus 09/20/2022   Pneumonia     during covid in 2020   Testosterone  deficiency    Dr Tommas Fragmin      Dispostion: Disposition decision including need for hospitalization was considered, and patient discharged from emergency department.  Imaging discussed    Final Clinical Impression(s) / ED Diagnoses Final diagnoses:  Osteoarthritis of right hip, unspecified osteoarthritis type  Right hip pain        Teddi Favors, DO 04/27/24 1235

## 2024-04-27 NOTE — Discharge Instructions (Addendum)
 It was a pleasure caring for you today in the emergency department.  Imaging is concerning for likely arthritis to your hip, no evidence of fracture. Recommend you follow up with orthopedics in the office if pain persists more than 7 days. Recommend light activity over the next few days, no heavy lifting for 7 days.  Please return to the emergency department for any worsening or worrisome symptoms.

## 2024-05-05 DIAGNOSIS — M25551 Pain in right hip: Secondary | ICD-10-CM | POA: Diagnosis not present

## 2024-05-25 ENCOUNTER — Other Ambulatory Visit: Payer: Self-pay | Admitting: Family

## 2024-05-25 ENCOUNTER — Encounter: Payer: Self-pay | Admitting: Hematology & Oncology

## 2024-05-25 ENCOUNTER — Other Ambulatory Visit (HOSPITAL_BASED_OUTPATIENT_CLINIC_OR_DEPARTMENT_OTHER): Payer: Self-pay

## 2024-05-25 DIAGNOSIS — E559 Vitamin D deficiency, unspecified: Secondary | ICD-10-CM

## 2024-05-25 MED ORDER — OMEGA-3-ACID ETHYL ESTERS 1 G PO CAPS
2.0000 | ORAL_CAPSULE | Freq: Two times a day (BID) | ORAL | 1 refills | Status: DC
  Filled 2024-05-25: qty 120, 30d supply, fill #0
  Filled 2024-07-10: qty 120, 30d supply, fill #1

## 2024-05-26 MED ORDER — VITAMIN D3 75 MCG (3000 UT) PO TABS: 1.0000 | ORAL_TABLET | Freq: Every day | ORAL | Status: AC

## 2024-05-29 ENCOUNTER — Other Ambulatory Visit (HOSPITAL_BASED_OUTPATIENT_CLINIC_OR_DEPARTMENT_OTHER): Payer: Self-pay

## 2024-06-07 DIAGNOSIS — R972 Elevated prostate specific antigen [PSA]: Secondary | ICD-10-CM | POA: Diagnosis not present

## 2024-06-08 LAB — PSA, TOTAL AND FREE
PSA, FREE: 0.75
PSA, Total: 4.22

## 2024-07-10 ENCOUNTER — Encounter: Payer: Self-pay | Admitting: Hematology & Oncology

## 2024-07-10 ENCOUNTER — Other Ambulatory Visit (HOSPITAL_BASED_OUTPATIENT_CLINIC_OR_DEPARTMENT_OTHER): Payer: Self-pay

## 2024-07-19 ENCOUNTER — Ambulatory Visit: Admitting: Family

## 2024-07-19 ENCOUNTER — Telehealth: Payer: Self-pay | Admitting: Family

## 2024-07-19 ENCOUNTER — Other Ambulatory Visit (HOSPITAL_BASED_OUTPATIENT_CLINIC_OR_DEPARTMENT_OTHER): Payer: Self-pay

## 2024-07-19 VITALS — BP 117/65 | HR 71 | Temp 97.9°F | Resp 16 | Ht 72.0 in | Wt 202.0 lb

## 2024-07-19 DIAGNOSIS — Z23 Encounter for immunization: Secondary | ICD-10-CM

## 2024-07-19 DIAGNOSIS — N529 Male erectile dysfunction, unspecified: Secondary | ICD-10-CM | POA: Diagnosis not present

## 2024-07-19 DIAGNOSIS — E559 Vitamin D deficiency, unspecified: Secondary | ICD-10-CM

## 2024-07-19 DIAGNOSIS — I10 Essential (primary) hypertension: Secondary | ICD-10-CM | POA: Diagnosis not present

## 2024-07-19 DIAGNOSIS — E782 Mixed hyperlipidemia: Secondary | ICD-10-CM

## 2024-07-19 DIAGNOSIS — N1831 Chronic kidney disease, stage 3a: Secondary | ICD-10-CM | POA: Diagnosis not present

## 2024-07-19 DIAGNOSIS — Z125 Encounter for screening for malignant neoplasm of prostate: Secondary | ICD-10-CM

## 2024-07-19 DIAGNOSIS — E538 Deficiency of other specified B group vitamins: Secondary | ICD-10-CM

## 2024-07-19 DIAGNOSIS — F32A Depression, unspecified: Secondary | ICD-10-CM

## 2024-07-19 DIAGNOSIS — C9000 Multiple myeloma not having achieved remission: Secondary | ICD-10-CM

## 2024-07-19 LAB — COMPREHENSIVE METABOLIC PANEL WITH GFR
ALT: 17 U/L (ref 0–53)
AST: 16 U/L (ref 0–37)
Albumin: 4.6 g/dL (ref 3.5–5.2)
Alkaline Phosphatase: 59 U/L (ref 39–117)
BUN: 13 mg/dL (ref 6–23)
CO2: 30 meq/L (ref 19–32)
Calcium: 9.4 mg/dL (ref 8.4–10.5)
Chloride: 102 meq/L (ref 96–112)
Creatinine, Ser: 1.27 mg/dL (ref 0.40–1.50)
GFR: 60.16 mL/min (ref >60.00)
Glucose, Bld: 80 mg/dL (ref 70–99)
Potassium: 4.6 meq/L (ref 3.5–5.1)
Sodium: 141 meq/L (ref 135–145)
Total Bilirubin: 0.8 mg/dL (ref 0.2–1.2)
Total Protein: 7.5 g/dL (ref 6.0–8.3)

## 2024-07-19 LAB — PSA: PSA: 5 ng/mL — ABNORMAL HIGH (ref 0.10–4.00)

## 2024-07-19 MED ORDER — BUPROPION HCL ER (XL) 150 MG PO TB24
150.0000 mg | ORAL_TABLET | Freq: Every day | ORAL | 1 refills | Status: AC
  Filled 2024-07-19: qty 90, 90d supply, fill #0

## 2024-07-19 MED ORDER — OMEGA-3-ACID ETHYL ESTERS 1 G PO CAPS
2.0000 | ORAL_CAPSULE | Freq: Two times a day (BID) | ORAL | 1 refills | Status: AC
  Filled 2024-11-22: qty 120, 30d supply, fill #0

## 2024-07-19 MED ORDER — CYANOCOBALAMIN 1000 MCG/ML IJ SOLN
1000.0000 ug | Freq: Once | INTRAMUSCULAR | Status: AC
  Administered 2024-07-19: 1000 ug via INTRAMUSCULAR

## 2024-07-19 NOTE — Assessment & Plan Note (Signed)
Stable on otc vit D, continue same.

## 2024-07-19 NOTE — Assessment & Plan Note (Signed)
Due for b12 injection

## 2024-07-19 NOTE — Progress Notes (Signed)
 Subjective:     Patient ID: Jimmy Day, male    DOB: 06-20-1961, 63 y.o.   MRN: [id number]  Chief Complaint  Patient presents with   Hypertension    Here for follow up    Hypertension    Discussed the use of AI scribe software for clinical note transcription with the patient, who gave verbal consent to proceed.  History of Present Illness  Jimmy Day is a 63 year old male who presents for a routine follow-up.  He is undergoing a sleep apnea study and experiences intermittent sleep issues, aiming for seven hours of sleep per night. He uses hydroxyzine  25 mg as needed for sleep and anxiety.  He takes vitamin D  5000 units daily. His cholesterol was checked in January and was within normal limits. He is on Lovaza  for elevated triglycerides and Lipitor for cholesterol management. His blood pressure is well-controlled at 117/65 mmHg with losartan  50 mg daily.  He takes Cialis  5 mg daily for erectile dysfunction and occasionally uses Viagra  PRN. He reports strong urinary flow.  He is on Wellbutrin  daily for mood. He follows up with a kidney specialist at [facility] and recalls being told he was fine at his last visit. He plans to resume monthly B12 injections.  He is due for a pneumonia vaccination.     Health Maintenance Due  Topic Date Due   COVID-19 Vaccine (5 - 2024-25 season) 07/25/2023   INFLUENZA VACCINE  06/23/2024    Past Medical History:  Diagnosis Date   Arthritis    spine   Elevated PSA 11/29/2017   Fasting hyperglycemia    NORMAL A1c   History of COVID-19 2020   Hyperlipidemia    Hypertension    Kappa light chain myeloma (HCC) 02/02/2014   Norovirus 09/20/2022   Pneumonia    during covid in 2020   Testosterone  deficiency    Dr Duncan    Past Surgical History:  Procedure Laterality Date   ANTERIOR CERVICAL DECOMP/DISCECTOMY FUSION N/A 04/30/2021   Procedure: Anterior Cervical Decompression Fusion Cervical three-four, Cervical  four-five, Cervical six-seven;  Surgeon: Debby Dorn MATSU, MD;  Location: [facility] OR;  Service: Neurosurgery;  Laterality: N/A;   CERVICAL PLATE INSERTION     POST COMPRESSION DISC INJURY   LIPOMAS REMOVAL     BENIGN FROM CHEST AND BACK    Family History  Problem Relation Age of Onset   Breast cancer Mother    Heart attack Father 20   Kidney disease Brother        RENAL FAILURE   Hypertension Brother        (died from sepsis at 47)   Heart attack Maternal Aunt 39       CABG   Stroke Maternal Uncle        CVA   Heart attack Paternal Grandmother    Diabetes Neg Hx    Colon cancer Neg Hx    Esophageal cancer Neg Hx    Rectal cancer Neg Hx    Stomach cancer Neg Hx    Colon polyps Neg Hx     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  Tobacco Use   Smoking status: Never  Smokeless tobacco: Never   Tobacco comments:    never used tobacco  Vaping Use   Vaping status: Never Used  Substance and Sexual Activity   Alcohol use: Not Currently    Comment:  rarely   Drug use: No   Sexual activity: Yes    Birth control/protection: Condom  Other Topics Concern   Not on file  Social History Narrative   Son in [place]    Son in [place]   9 grandchildren (6 grandsons, 3 grandchildren)   Daughter at [employer] state   Married   Truck driver   Enjoys exercise, basketball, watch sports, 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, dispensing   Social Drivers of Corporate investment banker Strain: Not on file  Food Insecurity: No Food Insecurity (09/10/2022)   Hunger Vital Sign    Worried About Running Out of Food in the Last Year: Never true    Ran Out of Food in the Last Year: Never true  Transportation Needs: No Transportation Needs (09/10/2022)    PRAPARE - Administrator, Civil Service (Medical): No    Lack of Transportation (Non-Medical): No  Physical Activity: Not on file  Stress: Not on file  Social Connections: Unknown (04/05/2022)   Received from [facility]   Social Network    Social Network: Not on file  Intimate Partner Violence: Not At Risk (09/10/2022)   Humiliation, Afraid, Rape, and Kick questionnaire    Fear of Current or Ex-Partner: No    Emotionally Abused: No    Physically Abused: No    Sexually Abused: No    Outpatient Medications Prior to Visit  Medication Sig Dispense Refill   atorvastatin  (LIPITOR) 10 MG tablet Take 1 tablet (10 mg total) by mouth daily. 90 tablet 3   Cholecalciferol  (VITAMIN D3) 75 MCG (3000 UT) TABS Take 1 tablet by mouth daily at 6 (six) AM.     hydrOXYzine  (VISTARIL ) 25 MG capsule Take 1 capsule (25 mg total) by mouth 2 (two) times daily as needed. 60 capsule 1   losartan  (COZAAR ) 50 MG tablet Take 1 tablet (50 mg total) by mouth daily. 90 tablet 3   tadalafil  (CIALIS ) 5 MG tablet Take 1 tablet (5 mg total) by mouth daily. 90 tablet 3   tamsulosin  (FLOMAX ) 0.4 MG CAPS capsule Take 1 capsule (0.4 mg total) by mouth daily. 90 capsule 3   VIAGRA  100 MG tablet Take 0.5 tablets (50 mg total) by mouth daily as needed (take 1/2 tablet by mouth daily as needed for erectile dysfunction). (Patient taking differently: Take 50 mg by mouth daily as needed for erectile dysfunction (take 1/2 tablet by mouth daily as needed for erectile dysfunction).) 10 tablet 1   buPROPion  (WELLBUTRIN  XL) 150 MG 24 hr tablet Take 1 tablet (150 mg total) by mouth daily. 90 tablet 1   cyclobenzaprine  (FLEXERIL ) 10 MG tablet Take 1 tablet (10 mg total) by mouth 2 (two) times daily as needed for muscle spasms. 20 tablet 0   ibuprofen  (ADVIL ) 600 MG tablet Take 1 tablet (600 mg total) by mouth every 6 (six) hours as needed. 30 tablet 0   lidocaine  (LIDODERM ) 5 % Place 1 patch onto the skin daily as needed. Remove  & Discard patch within 12 hours or as directed by MD 15 patch 0   omega-3 acid ethyl esters (LOVAZA ) 1 g capsule Take 2 capsules (2 g total) by mouth 2 (two) times daily. 120 capsule 1   oxyCODONE  (  ROXICODONE ) 5 MG immediate release tablet Take 1 tablet (5 mg total) by mouth every 4 (four) hours as needed for severe pain (pain score 7-10). 10 tablet 0   No facility-administered medications prior to visit.    No Known Allergies  ROS    See HPI Objective:    Physical Exam Constitutional:      General: He is not in acute distress.    Appearance: He is well-developed.  HENT:     Head: Normocephalic and atraumatic.  Cardiovascular:     Rate and Rhythm: Normal rate and regular rhythm.     Heart sounds: No murmur heard. Pulmonary:     Effort: Pulmonary effort is normal. No respiratory distress.     Breath sounds: Normal breath sounds. No wheezing or rales.  Skin:    General: Skin is warm and dry.  Neurological:     Mental Status: He is alert and oriented to person, place, and time.  Psychiatric:        Behavior: Behavior normal.        Thought Content: Thought content normal.      BP 117/65 (BP Location: Right Arm, Patient Position: Sitting, Cuff Size: Large)   Pulse 71   Temp 97.9 F (36.6 C) (Oral)   Resp 16   Ht 6' (1.829 m)   Wt 202 lb (91.6 kg)   SpO2 98%   BMI 27.40 kg/m  Wt Readings from Last 3 Encounters:  07/19/24 202 lb (91.6 kg)  04/27/24 202 lb (91.6 kg)  03/15/24 202 lb (91.6 kg)       Assessment & Plan:   Problem List Items Addressed This Visit       Unprioritized   Kappa light chain myeloma (HCC) (Chronic)   Declining further treatment. Feels well. Aware of option to return to hematologist if symptoms worsen.        Vitamin D  deficiency - Primary   Stable on otc vit D, continue same.       Hyperlipidemia   Lab Results  Component Value Date   CHOL 146 12/14/2023   HDL 40 12/14/2023   LDLCALC 78 12/14/2023   LDLDIRECT 93.0 11/26/2017    TRIG 184 (H) 12/14/2023   CHOLHDL 3.7 12/14/2023   Continues lovaza  and atorvastatin .  Continue same.       Relevant Medications   omega-3 acid ethyl esters (LOVAZA ) 1 g capsule   Essential hypertension   BP Readings from Last 3 Encounters:  07/19/24 117/65  04/27/24 131/89  03/15/24 115/64   Stable on losartan  50mg .       Relevant Medications   omega-3 acid ethyl esters (LOVAZA ) 1 g capsule   Erectile dysfunction   Stable on daily cialis .       Depression   Mood is stable on Wellbutrin , continue same       Relevant Medications   buPROPion  (WELLBUTRIN  XL) 150 MG 24 hr tablet   Chronic kidney disease, stage 3a (HCC)   Lab Results  Component Value Date   CREATININE 1.31 03/15/2024         Relevant Orders   Comp Met (CMET)   B12 deficiency   Due for b12 injection.        Other Visit Diagnoses       Screening for prostate cancer       Relevant Orders   PSA     Need for pneumococcal 20-valent conjugate vaccination       Relevant Orders   Pneumococcal conjugate  vaccine 20-valent (Prevnar 20) (Completed)       I have discontinued Jimmy Day's cyclobenzaprine , lidocaine , ibuprofen , and oxyCODONE . I am also having him maintain his Viagra , losartan , atorvastatin , tamsulosin , hydrOXYzine , tadalafil , Vitamin D3, omega-3 acid ethyl esters, and buPROPion . We administered cyanocobalamin .  Meds ordered this encounter  Medications   omega-3 acid ethyl esters (LOVAZA ) 1 g capsule    Sig: Take 2 capsules (2 g total) by mouth 2 (two) times daily.    Dispense:  120 capsule    Refill:  1    Supervising Provider:   DOMENICA BLACKBIRD A [[id number]]   buPROPion  (WELLBUTRIN  XL) 150 MG 24 hr tablet    Sig: Take 1 tablet (150 mg total) by mouth daily.    Dispense:  90 tablet    Refill:  1    Supervising Provider:   DOMENICA BLACKBIRD A [[id number]]   cyanocobalamin  (VITAMIN B12) injection 1,000 mcg

## 2024-07-19 NOTE — Assessment & Plan Note (Signed)
 Lab Results  Component Value Date   CREATININE 1.31 03/15/2024

## 2024-07-19 NOTE — Assessment & Plan Note (Addendum)
 Lab Results  Component Value Date   CHOL 146 12/14/2023   HDL 40 12/14/2023   LDLCALC 78 12/14/2023   LDLDIRECT 93.0 11/26/2017   TRIG 184 (H) 12/14/2023   CHOLHDL 3.7 12/14/2023   Continues lovaza  and atorvastatin .  Continue same.

## 2024-07-19 NOTE — Telephone Encounter (Signed)
 Please request last office visit from [facility].

## 2024-07-19 NOTE — Assessment & Plan Note (Signed)
 BP Readings from Last 3 Encounters:  07/19/24 117/65  04/27/24 131/89  03/15/24 115/64   Stable on losartan  50mg .

## 2024-07-19 NOTE — Assessment & Plan Note (Signed)
 Stable on daily cialis.

## 2024-07-19 NOTE — Assessment & Plan Note (Signed)
 Mood is stable on Wellbutrin , continue same

## 2024-07-19 NOTE — Assessment & Plan Note (Signed)
 Declining further treatment. Feels well. Aware of option to return to hematologist if symptoms worsen.

## 2024-07-21 NOTE — Telephone Encounter (Signed)
 Electronic request sent

## 2024-07-22 ENCOUNTER — Ambulatory Visit: Payer: Self-pay | Admitting: Family

## 2024-08-01 ENCOUNTER — Other Ambulatory Visit (HOSPITAL_BASED_OUTPATIENT_CLINIC_OR_DEPARTMENT_OTHER): Payer: Self-pay

## 2024-08-22 ENCOUNTER — Encounter (HOSPITAL_BASED_OUTPATIENT_CLINIC_OR_DEPARTMENT_OTHER): Payer: Self-pay | Admitting: Internal Medicine

## 2024-08-22 DIAGNOSIS — G4733 Obstructive sleep apnea (adult) (pediatric): Secondary | ICD-10-CM

## 2024-08-25 ENCOUNTER — Other Ambulatory Visit (HOSPITAL_BASED_OUTPATIENT_CLINIC_OR_DEPARTMENT_OTHER): Payer: Self-pay

## 2024-10-16 ENCOUNTER — Ambulatory Visit (HOSPITAL_BASED_OUTPATIENT_CLINIC_OR_DEPARTMENT_OTHER): Payer: Self-pay | Attending: Internal Medicine | Admitting: Internal Medicine

## 2024-10-16 DIAGNOSIS — G4733 Obstructive sleep apnea (adult) (pediatric): Secondary | ICD-10-CM | POA: Diagnosis not present

## 2024-10-30 NOTE — Procedures (Signed)
 [facility] [address] [place], [place] [address] Tel: [phone]   Fax: [phone]  Titration Interpretation  Patient Name:  Jimmy Day, Jimmy Day Date:  10/16/2024 Referring Physician:  REGGY SALT [phone]) %%startinterp%% Indications for Polysomnography The patient is a 63 year-old Male who is 6' and weighs 201.0 lbs. His BMI equals 27.5.  A full night titration treatment study was performed. Baseline- HST 07/27/24  AHI 22/hr, desat to 79%, body weight 20`1 lbs  Medication  No Data.   Polysomnogram Data A full night polysomnogram recorded the standard physiologic parameters including EEG, EOG, EMG, EKG, nasal and oral airflow.  Respiratory parameters of chest and abdominal movements were recorded with Respiratory Inductance Plethysmography belts.  Oxygen saturation was recorded by pulse oximetry.   Sleep Architecture The total recording time of the polysomnogram was 417.9 minutes.  The total sleep time was 373.5 minutes.  The patient spent 6.6% of total sleep time in Stage N1, 71.6% in Stage N2, 0.1% in Stages N3, and 21.7% in REM.  Sleep latency was 1.5 minutes.  REM latency was 49.5 minutes.  Sleep Efficiency was 89.4%.  Wake after Sleep Onset time was 42.5 minutes.  Titration Summary The patient was titrated at pressures ranging from 6/2* cm/H20 with supplemental oxygen at - up to 19/2* cm/H20 with supplemental oxygen at -.  The last pressure used in the study was 19/2* cm/H20 with supplemental oxygen at -.  Respiratory Events The polysomnogram revealed a presence of 8 obstructive, 18 central, and - mixed apneas resulting in an Apnea index of 4.2 events per hour.  There were 55 hypopneas (>=3% desaturation and/or arousal) resulting in an Apnea\Hypopnea Index (AHI >=3% desaturation and/or arousal) of 13.0 events per hour.  There were 18 hypopneas (>=4% desaturation) resulting in an Apnea\Hypopnea Index (AHI >=4% desaturation) of 7.1  events per hour.  There were 77 Respiratory Effort Related Arousals resulting in a RERA index of 12.4 events per hour. The Respiratory Disturbance Index is 25.4 events per hour.  The snore index was - events per hour.  Mean oxygen saturation was 96.4%.  The lowest oxygen saturation during sleep was 83.0%.  Time spent <=88% oxygen saturation was 0.3 minutes (0.1%).  Limb Activity There were 45 limb movements recorded.  Of this total, 45 were classified as PLMs.  Of the PLMs, 3 were associated with arousals.  The Limb Movement index was 7.2 per hour while the PLM index was 7.2 per hour.  Cardiac Summary The average pulse rate was 69.4 bpm.  The minimum pulse rate was 44.0 bpm while the maximum pulse rate was 98.0 bpm.  Cardiac rhythm was normal- occasional Pac AND pvc  Comments: CPAP titration to 19cwp, EPR 2 cm with residual AHI (4%) 0/hr, nadir saturation 95%, mean 96.8%.  Diagnosis: Obstructive sleep apnea  Recommendations:  Suggest autopap 10-20 or fixed CPAP 19, EPR 2. Patient wore a medium F&P Evora full face mask with heated humidifier.   This study was personally reviewed and electronically signed by: - Accredited Board Certified in Sleep Medicine Date/Time: 10/30/24   1:28    %%endinterp%%  Titration Report  Patient Name: Jimmy Day Date: 10/16/2024  Date of Birth: 09-21-1961 Study Type: CPAP Titration  Age: 51 year MRN #: [id number]  Sex: Male Interpreting Physician: SALT REGGY, [id number]  Height: 6' Referring Physician: Mckenzy Salazar [phone])  Weight: 201.0 lbs Recording Tech: Orie Sires RRT RPSGT RST  BMI: 27.5 Scoring Tech: Orie  Spruill RRT RPSGT RST  ESS: 14 Neck Size: 15  Mask Type [employer] Evora Full Final Pressure: -  Mask Size: Medium Supplemental O2: -   Study Overview  Lights Off: 10:26:11 PM  Count Index  Lights On: 05:24:04 AM Awakenings: 37 5.9  Time in Bed: 417.9 min. Arousals: 150 24.1  Total Sleep Time: 373.5 min. AHI (>=3% Desat and/or  Ar.): 81 13.0   Sleep Efficiency: 89.4% AHI (>=4% Desat): 44 7.1   Sleep Latency: 1.5 min. Limb Movements: 45 7.2  Wake After Sleep Onset: 42.5 min. Snore: - -  REM Latency from Sleep Onset: 49.5 min. Desaturations: 61 9.8     Minimum SpO2 TST: 83.0%    Sleep Architecture  % of Time in Bed Stages Time (mins) % Sleep Time  Wake 44.5   Stage N1 24.5 6.6%  Stage N2 267.5 71.6%  Stage N3 0.5 0.1%  REM 81.0 21.7%   Arousal Summary   NREM REM Sleep Index  Respiratory Arousals 107 4 111 17.8  PLM Arousals 3 - 3 0.5  Isolated Limb Movement Arousals - - - -  Snore Arousals - - - -  Spontaneous Arousals 31 5 36 5.8  Total 141 9 150 24.1   Limb Movement Summary   Count Index  Isolated Limb Movements - -  Periodic Limb Movements (PLMs) 45 7.2  Total Limb Movements 45 7.2    Respiratory Summary   By Sleep Stage By Body Position Total   NREM REM Supine Non-Supine   Time (min) 292.5 81.0 364.5 9.0 373.5         Obstructive Apnea 7 1 8  - 8  Mixed Apnea - - - - -  Central Apnea 18 - 16 2 18   Total Apneas 25 1 24 2 26   Total Apnea Index 5.1 0.7 4.0 13.3 4.2         Hypopneas (>=3% Desat and/or Ar.) 47 8 54 1 55  AHI (>=3% Desat and/or Ar.) 14.8 6.7 12.8 20.0 13.0         Hypopneas (>=4% Desat) 15 3 18  - 18  AHI (>=4% Desat) 8.2 3.0 6.9 13.3 7.1          RERAs 74 3 75 2 77  RERA Index 15.2 2.2 12.3 13.3 12.4         RDI 29.9 8.9 25.2 33.3 25.4    Respiratory Event Type Index  Central Apneas 2.9  Obstructive Apneas 1.3  Mixed Apneas -  Central Hypopneas 0.3  Obstructive Hypopneas 9.0  Central Apnea + Hypopnea (CAHI) 3.2  Obstructive Apnea + Hypopnea (OAHI) 10.3   Respiratory Event Durations   Apnea Hypopnea   NREM REM NREM REM  Average (seconds) 15.7 48.0 22.1 21.2  Maximum (seconds) 28.3 48.0 47.4 44.8    Oxygen Saturation Summary   Wake NREM REM TST TIB  Average SpO2 97.1% 96.4% 96.0% 96.3% 96.4%  Minimum SpO2 92.0% 83.0% 85.0% 83.0% 83.0%  Maximum SpO2  99.0% 99.0% 98.0% 99.0% 99.0%   Oxygen Saturation Distribution  Range (%) Time in range (min) Time in range (%)  90.0 - 100.0 412.6 99.8%  80.0 - 90.0 0.9 0.2%  70.0 - 80.0 - -  60.0 - 70.0 - -  50.0 - 60.0 - -  0.0 - 50.0 - -  Time Spent <=88% SpO2  Range (%) Time in range (min) Time in range (%)  0.0 - 88.0 0.3 0.1%      Count Index  Desaturations  61 9.8    Cardiac Summary   Wake NREM REM Sleep Total  Average Pulse Rate (BPM) 69.7 68.7 71.7 69.4 69.4  Minimum Pulse Rate (BPM) 48.0 44.0 58.0 44.0 44.0  Maximum Pulse Rate (BPM) 91.0 98.0 91.0 98.0 98.0   Pulse Rate Distribution:  Range (bpm) Time in range (min) Time in range (%)  0.0 - 40.0 - -  40.0 - 60.0 7.6 1.8%  60.0 - 80.0 397.3 96.0%  80.0 - 100.0 7.5 1.8%  100.0 - 120.0 - -  120.0 - 140.0 - -  140.0 - 200.0 - -   Titration Summary  PAP Device PAP Level O2 Level Time (min) Wake (min) NREM (min) REM (min) Supine TST (min) Sleep Eff% OA# CA# MA# Hyp# (>=3%) AHI (>=3%) Hyp# (>=4%) AHI (>=%4) RERA RDI SpO2 <=88% (min) Min SpO2 Mean SpO2 Ar. Index  CPAP EPR 6/2 - 17.5 5.0 12.5 0.0  3.5 71.4% 1 3 - 6 48.0 1  24.0 2  57.6  0.0 90.0 95.3 76.8  CPAP EPR 8/2 - 10.0 0.0 10.0 0.0  10.0 100.0% 3 - - 11 84.0 6  54.0 9  138.0  0.0 92.0 95.0 96.0  CPAP EPR 10/2 - 34.5 1.0 25.0 8.5  33.5 97.1% - - - 21 37.6 9  16.1 15  64.5  0.0 90.0 94.7 48.4  CPAP EPR 12/2 - 37.0 0.0 37.0 0.0  37.0 100.0% - - - 8 13.0 2  3.2 42  81.1  0.0 93.0 95.9 74.6  CPAP EPR 14/2 - 76.0 1.0 47.5 27.5  75.0 98.7% 1 1 - 3 4.0 -  1.6 4  7.2  0.3 83.0 96.7 7.2  CPAP EPR 16/2 - 150.5 27.5 110.5 12.5  123.0 81.7% 2 10 - 5 8.3 -  5.9 1  8.8  0.0 93.0 96.7 10.2  CPAP EPR 18/2 - 48.5 8.5 23.0 17.0  40.0 82.5% 1 4 - 1 9.0 -  7.5 4  15.0  0.0 94.0 96.6 18.0  CPAP EPR 19/2 - 44.0 1.5 27.0 15.5  42.5 96.6% - - - - - -  - -  -  0.0 95.0 96.8 4.2   Hypnograms                           Technologist Comments  Patient was ordered as a CPAP  titration. Patient is a 63 year old African Americanmale who was sent to the sleep center for OSA. Patient was placed on CPAP of 6 CMH2O with 2 CMH2O of EPR at 10:26 pm and was increased to a CPAP pressure of -----CMH2O with 2 CMH2O of EPR. There was no audible snoring noted on final pressure setting. Patient was increased for respiratory events with and without a 4%  desat or arousal, and audible snoring. Patient tolerated CPAP trial fairly well. No oxygen was applied. No medications were reported taken. PLM's/PLMA's were noted. Questionable cardiac arrhythmias were noted see attached sheets and epochs for examples: 10, 15, 17, 30, 74, 126, 449, 461,535, 538, 672,  etc. One restroom visit was noted. Study was performed in room # 3. Patient was fitted with a [employer] evora full nasal/oral full face mask with heated humidification.                        Reggy Salt Diplomate, Biomedical Engineer of Sleep Medicine  ELECTRONICALLY SIGNED ON:  10/30/2024, 12:41 PM [facility] PH: [phone]   FX: [phone] ACCREDITED BY THE AMERICAN ACADEMY OF SLEEP MEDICINE

## 2024-10-30 NOTE — Procedures (Signed)
  Indications for Polysomnography The patient is a 63 year-old Male who is 6' and weighs 201.0 lbs. His BMI equals 27.5.  A full night titration treatment study was performed.  MedicationNo Data. Polysomnogram Data A full night polysomnogram recorded the standard physiologic parameters including EEG, EOG, EMG, EKG, nasal and oral airflow.  Respiratory parameters of chest and abdominal movements were recorded with Respiratory Inductance Plethysmography belts.   Oxygen saturation was recorded by pulse oximetry.  Sleep Architecture The total recording time of the polysomnogram was 417.9 minutes.  The total sleep time was 373.5 minutes.  The patient spent 6.6% of total sleep time in Stage N1, 71.6% in Stage N2, 0.1% in Stages N3, and 21.7% in REM.  Sleep latency was 1.5 minutes.   REM latency was 49.5 minutes.  Sleep Efficiency was 89.4%.  Wake after Sleep Onset time was 42.5 minutes.  Titration Summary The patient was titrated at pressures ranging from 6/2* cm/H20 with supplemental oxygen at - up to 19/2* cm/H20 with supplemental oxygen at -.  The last pressure used in the study was 19/2* cm/H20 with supplemental oxygen at -.  Respiratory Events The polysomnogram revealed a presence of 8 obstructive, 18 central, and - mixed apneas resulting in an Apnea index of 4.2 events per hour.  There were 55 hypopneas (GreaterEqual to3% desaturation and/or arousal) resulting in an Apnea\Hypopnea Index (AHI  GreaterEqual to3% desaturation and/or arousal) of 13.0 events per hour.  There were 18 hypopneas (GreaterEqual to4% desaturation) resulting in an Apnea\Hypopnea Index (AHI GreaterEqual to4% desaturation) of 7.1 events per hour.  There were 77 Respiratory  Effort Related Arousals resulting in a RERA index of 12.4 events per hour. The Respiratory Disturbance Index is 25.4 events per hour.  The snore index was - events per hour.  Mean oxygen saturation was 96.4%.  The lowest oxygen saturation during sleep was  83.0%.  Time spent LessEqual to88% oxygen saturation was  minutes ().  Limb Activity There were 45 limb movements recorded.  Of this total, 45 were classified as PLMs.  Of the PLMs, 3 were associated with arousals.  The Limb Movement index was 7.2 per hour while the PLM index was 7.2 per hour.  Cardiac Summary The average pulse rate was 69.4 bpm.  The minimum pulse rate was 44.0 bpm while the maximum pulse rate was 98.0 bpm.  Cardiac rhythm was normal/abnormal.  Comments:  Diagnosis:  Recommendations:   This study was personally reviewed and electronically signed by: - Accredited Board Certified in Sleep Medicine Date/Time:

## 2024-11-22 ENCOUNTER — Encounter: Payer: Self-pay | Admitting: Hematology & Oncology

## 2024-11-22 ENCOUNTER — Other Ambulatory Visit (HOSPITAL_BASED_OUTPATIENT_CLINIC_OR_DEPARTMENT_OTHER): Payer: Self-pay

## 2024-11-27 ENCOUNTER — Other Ambulatory Visit (HOSPITAL_BASED_OUTPATIENT_CLINIC_OR_DEPARTMENT_OTHER): Payer: Self-pay

## 2024-11-27 MED ORDER — LOSARTAN POTASSIUM 50 MG PO TABS
50.0000 mg | ORAL_TABLET | Freq: Every day | ORAL | 3 refills | Status: AC
  Filled 2024-11-27: qty 90, 90d supply, fill #0
